# Patient Record
Sex: Female | Born: 1945 | Race: Black or African American | Hispanic: No | Smoking: Never smoker
Health system: Southern US, Community
[De-identification: ages and names within clinical notes are randomized; demographics above are authoritative.]

## PROBLEM LIST (undated history)

## (undated) DIAGNOSIS — M1990 Unspecified osteoarthritis, unspecified site: Secondary | ICD-10-CM

## (undated) DIAGNOSIS — I5189 Other ill-defined heart diseases: Secondary | ICD-10-CM

## (undated) DIAGNOSIS — K219 Gastro-esophageal reflux disease without esophagitis: Secondary | ICD-10-CM

## (undated) DIAGNOSIS — I10 Essential (primary) hypertension: Secondary | ICD-10-CM

## (undated) DIAGNOSIS — R42 Dizziness and giddiness: Secondary | ICD-10-CM

## (undated) DIAGNOSIS — T7840XA Allergy, unspecified, initial encounter: Secondary | ICD-10-CM

## (undated) DIAGNOSIS — Z9289 Personal history of other medical treatment: Secondary | ICD-10-CM

## (undated) DIAGNOSIS — H269 Unspecified cataract: Secondary | ICD-10-CM

## (undated) DIAGNOSIS — J189 Pneumonia, unspecified organism: Secondary | ICD-10-CM

## (undated) DIAGNOSIS — E785 Hyperlipidemia, unspecified: Secondary | ICD-10-CM

## (undated) DIAGNOSIS — K862 Cyst of pancreas: Secondary | ICD-10-CM

## (undated) HISTORY — DX: Dizziness and giddiness: R42

## (undated) HISTORY — PX: EYE SURGERY: SHX253

## (undated) HISTORY — DX: Allergy, unspecified, initial encounter: T78.40XA

## (undated) HISTORY — PX: ESOPHAGOGASTRODUODENOSCOPY: SHX1529

## (undated) HISTORY — PX: THYROIDECTOMY, PARTIAL: SHX18

## (undated) HISTORY — PX: COLONOSCOPY: SHX174

## (undated) HISTORY — PX: CERVICAL FUSION: SHX112

## (undated) HISTORY — PX: REPLACEMENT TOTAL KNEE BILATERAL: SUR1225

## (undated) HISTORY — PX: BACK SURGERY: SHX140

## (undated) HISTORY — PX: ABDOMINAL HYSTERECTOMY: SHX81

---

## 2002-11-02 ENCOUNTER — Encounter: Payer: Self-pay | Admitting: Internal Medicine

## 2002-11-02 ENCOUNTER — Encounter: Admission: RE | Admit: 2002-11-02 | Discharge: 2002-11-02 | Payer: Self-pay | Admitting: Internal Medicine

## 2002-11-09 ENCOUNTER — Encounter: Admission: RE | Admit: 2002-11-09 | Discharge: 2002-11-09 | Payer: Self-pay | Admitting: Internal Medicine

## 2002-11-09 ENCOUNTER — Encounter: Payer: Self-pay | Admitting: Internal Medicine

## 2003-01-24 ENCOUNTER — Encounter: Payer: Self-pay | Admitting: Orthopedic Surgery

## 2003-01-26 ENCOUNTER — Inpatient Hospital Stay (HOSPITAL_COMMUNITY): Admission: RE | Admit: 2003-01-26 | Discharge: 2003-01-30 | Payer: Self-pay | Admitting: Orthopedic Surgery

## 2003-08-11 ENCOUNTER — Encounter: Payer: Self-pay | Admitting: Gastroenterology

## 2003-08-18 ENCOUNTER — Encounter: Payer: Self-pay | Admitting: Gastroenterology

## 2003-08-18 ENCOUNTER — Encounter (INDEPENDENT_AMBULATORY_CARE_PROVIDER_SITE_OTHER): Payer: Self-pay | Admitting: *Deleted

## 2003-08-18 ENCOUNTER — Ambulatory Visit (HOSPITAL_COMMUNITY): Admission: RE | Admit: 2003-08-18 | Discharge: 2003-08-18 | Payer: Self-pay | Admitting: Gastroenterology

## 2003-10-07 ENCOUNTER — Encounter: Admission: RE | Admit: 2003-10-07 | Discharge: 2003-10-07 | Payer: Self-pay | Admitting: Internal Medicine

## 2003-10-07 ENCOUNTER — Encounter (INDEPENDENT_AMBULATORY_CARE_PROVIDER_SITE_OTHER): Payer: Self-pay | Admitting: *Deleted

## 2003-11-30 ENCOUNTER — Encounter: Payer: Self-pay | Admitting: Gastroenterology

## 2003-12-08 ENCOUNTER — Ambulatory Visit (HOSPITAL_COMMUNITY): Admission: RE | Admit: 2003-12-08 | Discharge: 2003-12-08 | Payer: Self-pay | Admitting: Gastroenterology

## 2003-12-08 ENCOUNTER — Encounter (INDEPENDENT_AMBULATORY_CARE_PROVIDER_SITE_OTHER): Payer: Self-pay | Admitting: *Deleted

## 2003-12-08 ENCOUNTER — Encounter: Payer: Self-pay | Admitting: Gastroenterology

## 2003-12-09 ENCOUNTER — Encounter: Admission: RE | Admit: 2003-12-09 | Discharge: 2003-12-09 | Payer: Self-pay | Admitting: Internal Medicine

## 2003-12-28 ENCOUNTER — Encounter: Payer: Self-pay | Admitting: Gastroenterology

## 2004-06-20 ENCOUNTER — Encounter: Payer: Self-pay | Admitting: Gastroenterology

## 2004-08-22 ENCOUNTER — Encounter (INDEPENDENT_AMBULATORY_CARE_PROVIDER_SITE_OTHER): Payer: Self-pay | Admitting: Cardiology

## 2004-08-22 ENCOUNTER — Ambulatory Visit (HOSPITAL_COMMUNITY): Admission: RE | Admit: 2004-08-22 | Discharge: 2004-08-22 | Payer: Self-pay | Admitting: Cardiology

## 2004-08-29 ENCOUNTER — Encounter: Admission: RE | Admit: 2004-08-29 | Discharge: 2004-08-29 | Payer: Self-pay | Admitting: Cardiology

## 2004-09-30 ENCOUNTER — Emergency Department (HOSPITAL_COMMUNITY): Admission: EM | Admit: 2004-09-30 | Discharge: 2004-09-30 | Payer: Self-pay | Admitting: Emergency Medicine

## 2004-10-05 ENCOUNTER — Inpatient Hospital Stay (HOSPITAL_COMMUNITY): Admission: AD | Admit: 2004-10-05 | Discharge: 2004-10-09 | Payer: Self-pay | Admitting: Specialist

## 2004-11-29 ENCOUNTER — Other Ambulatory Visit: Admission: RE | Admit: 2004-11-29 | Discharge: 2004-11-29 | Payer: Self-pay | Admitting: Obstetrics and Gynecology

## 2005-01-07 ENCOUNTER — Encounter (HOSPITAL_COMMUNITY): Admission: RE | Admit: 2005-01-07 | Discharge: 2005-01-24 | Payer: Self-pay | Admitting: Internal Medicine

## 2005-02-12 ENCOUNTER — Encounter: Admission: RE | Admit: 2005-02-12 | Discharge: 2005-02-12 | Payer: Self-pay | Admitting: Obstetrics and Gynecology

## 2005-02-27 ENCOUNTER — Encounter: Admission: RE | Admit: 2005-02-27 | Discharge: 2005-02-27 | Payer: Self-pay | Admitting: Specialist

## 2005-03-05 ENCOUNTER — Encounter (INDEPENDENT_AMBULATORY_CARE_PROVIDER_SITE_OTHER): Payer: Self-pay | Admitting: *Deleted

## 2005-03-06 ENCOUNTER — Inpatient Hospital Stay (HOSPITAL_COMMUNITY): Admission: RE | Admit: 2005-03-06 | Discharge: 2005-03-06 | Payer: Self-pay

## 2005-08-19 ENCOUNTER — Encounter (INDEPENDENT_AMBULATORY_CARE_PROVIDER_SITE_OTHER): Payer: Self-pay | Admitting: *Deleted

## 2005-08-19 ENCOUNTER — Encounter (HOSPITAL_COMMUNITY): Admission: RE | Admit: 2005-08-19 | Discharge: 2005-11-17 | Payer: Self-pay | Admitting: Gastroenterology

## 2005-08-19 ENCOUNTER — Encounter: Payer: Self-pay | Admitting: Gastroenterology

## 2005-08-22 ENCOUNTER — Encounter: Payer: Self-pay | Admitting: Gastroenterology

## 2005-08-22 ENCOUNTER — Encounter (INDEPENDENT_AMBULATORY_CARE_PROVIDER_SITE_OTHER): Payer: Self-pay | Admitting: *Deleted

## 2005-09-10 ENCOUNTER — Encounter: Admission: RE | Admit: 2005-09-10 | Discharge: 2005-09-10 | Payer: Self-pay | Admitting: Specialist

## 2005-09-12 ENCOUNTER — Encounter: Payer: Self-pay | Admitting: Gastroenterology

## 2005-10-14 ENCOUNTER — Ambulatory Visit: Payer: Self-pay | Admitting: Physical Medicine & Rehabilitation

## 2005-10-14 ENCOUNTER — Encounter
Admission: RE | Admit: 2005-10-14 | Discharge: 2006-01-12 | Payer: Self-pay | Admitting: Physical Medicine & Rehabilitation

## 2005-12-02 ENCOUNTER — Ambulatory Visit (HOSPITAL_BASED_OUTPATIENT_CLINIC_OR_DEPARTMENT_OTHER): Admission: RE | Admit: 2005-12-02 | Discharge: 2005-12-02 | Payer: Self-pay | Admitting: Urology

## 2006-01-22 ENCOUNTER — Encounter
Admission: RE | Admit: 2006-01-22 | Discharge: 2006-04-22 | Payer: Self-pay | Admitting: Physical Medicine & Rehabilitation

## 2006-01-22 ENCOUNTER — Ambulatory Visit: Payer: Self-pay | Admitting: Physical Medicine & Rehabilitation

## 2006-02-13 ENCOUNTER — Ambulatory Visit (HOSPITAL_COMMUNITY): Admission: RE | Admit: 2006-02-13 | Discharge: 2006-02-13 | Payer: Self-pay | Admitting: Orthopedic Surgery

## 2006-03-05 ENCOUNTER — Encounter: Admission: RE | Admit: 2006-03-05 | Discharge: 2006-03-05 | Payer: Self-pay | Admitting: Internal Medicine

## 2006-04-10 ENCOUNTER — Encounter: Admission: RE | Admit: 2006-04-10 | Discharge: 2006-04-10 | Payer: Self-pay | Admitting: Specialist

## 2006-06-24 ENCOUNTER — Inpatient Hospital Stay (HOSPITAL_COMMUNITY): Admission: RE | Admit: 2006-06-24 | Discharge: 2006-06-27 | Payer: Self-pay | Admitting: Specialist

## 2007-04-09 ENCOUNTER — Encounter: Admission: RE | Admit: 2007-04-09 | Discharge: 2007-04-09 | Payer: Self-pay | Admitting: Internal Medicine

## 2007-06-23 ENCOUNTER — Emergency Department (HOSPITAL_COMMUNITY): Admission: EM | Admit: 2007-06-23 | Discharge: 2007-06-23 | Payer: Self-pay | Admitting: Emergency Medicine

## 2007-12-08 ENCOUNTER — Encounter: Admission: RE | Admit: 2007-12-08 | Discharge: 2007-12-08 | Payer: Self-pay | Admitting: Specialist

## 2008-04-01 ENCOUNTER — Inpatient Hospital Stay (HOSPITAL_COMMUNITY): Admission: RE | Admit: 2008-04-01 | Discharge: 2008-04-05 | Payer: Self-pay | Admitting: Specialist

## 2008-05-12 ENCOUNTER — Encounter: Admission: RE | Admit: 2008-05-12 | Discharge: 2008-05-12 | Payer: Self-pay | Admitting: Internal Medicine

## 2008-09-29 ENCOUNTER — Encounter: Payer: Self-pay | Admitting: Gastroenterology

## 2008-10-05 ENCOUNTER — Encounter: Payer: Self-pay | Admitting: Gastroenterology

## 2009-03-31 DIAGNOSIS — N952 Postmenopausal atrophic vaginitis: Secondary | ICD-10-CM

## 2009-05-15 ENCOUNTER — Encounter: Admission: RE | Admit: 2009-05-15 | Discharge: 2009-05-15 | Payer: Self-pay | Admitting: Family Medicine

## 2009-06-13 ENCOUNTER — Emergency Department (HOSPITAL_COMMUNITY): Admission: EM | Admit: 2009-06-13 | Discharge: 2009-06-13 | Payer: Self-pay | Admitting: Emergency Medicine

## 2009-06-17 ENCOUNTER — Emergency Department (HOSPITAL_COMMUNITY): Admission: EM | Admit: 2009-06-17 | Discharge: 2009-06-17 | Payer: Self-pay | Admitting: Emergency Medicine

## 2010-04-02 ENCOUNTER — Encounter: Payer: Self-pay | Admitting: Gastroenterology

## 2010-04-05 ENCOUNTER — Encounter: Payer: Self-pay | Admitting: Gastroenterology

## 2010-04-12 ENCOUNTER — Encounter (INDEPENDENT_AMBULATORY_CARE_PROVIDER_SITE_OTHER): Payer: Self-pay | Admitting: *Deleted

## 2010-04-16 DIAGNOSIS — K219 Gastro-esophageal reflux disease without esophagitis: Secondary | ICD-10-CM | POA: Insufficient documentation

## 2010-04-16 DIAGNOSIS — M1990 Unspecified osteoarthritis, unspecified site: Secondary | ICD-10-CM | POA: Insufficient documentation

## 2010-04-16 DIAGNOSIS — K449 Diaphragmatic hernia without obstruction or gangrene: Secondary | ICD-10-CM | POA: Insufficient documentation

## 2010-04-16 DIAGNOSIS — R1310 Dysphagia, unspecified: Secondary | ICD-10-CM | POA: Insufficient documentation

## 2010-04-16 DIAGNOSIS — I10 Essential (primary) hypertension: Secondary | ICD-10-CM | POA: Insufficient documentation

## 2010-04-16 DIAGNOSIS — E785 Hyperlipidemia, unspecified: Secondary | ICD-10-CM | POA: Insufficient documentation

## 2010-04-17 ENCOUNTER — Ambulatory Visit: Payer: Self-pay | Admitting: Gastroenterology

## 2010-05-16 ENCOUNTER — Ambulatory Visit
Admission: RE | Admit: 2010-05-16 | Discharge: 2010-05-16 | Payer: Self-pay | Source: Home / Self Care | Attending: Gastroenterology | Admitting: Gastroenterology

## 2010-05-16 ENCOUNTER — Other Ambulatory Visit: Payer: Self-pay | Admitting: Gastroenterology

## 2010-05-17 LAB — HELICOBACTER PYLORI SCREEN-BIOPSY: UREASE: NEGATIVE

## 2010-05-18 ENCOUNTER — Encounter: Payer: Self-pay | Admitting: Gastroenterology

## 2010-05-20 ENCOUNTER — Encounter: Payer: Self-pay | Admitting: Specialist

## 2010-05-26 ENCOUNTER — Emergency Department (HOSPITAL_COMMUNITY)
Admission: EM | Admit: 2010-05-26 | Discharge: 2010-05-26 | Payer: Self-pay | Source: Home / Self Care | Admitting: Emergency Medicine

## 2010-05-31 NOTE — Procedures (Signed)
Summary: Colonoscopy  Patient: [name] Note: All result statuses are Final unless otherwise noted.  Tests: (1) Colonoscopy (COL)   COL Colonoscopy           DONE     [facility]     [address]     [place], [place]  [address]           COLONOSCOPY PROCEDURE REPORT           PATIENT:  Denise, Forbes  MR#:  [id number]     BIRTHDATE:  12-Apr-1946, 64 yrs. old  GENDER:  female     ENDOSCOPIST:  Vania Rea. Jarold Motto, MD, [facility]     REF. BY:  Elizabeth Palau, F.N.P.-B.C.     PROCEDURE DATE:  05/16/2010     PROCEDURE:  Higher-risk screening colonoscopy G0105           ASA CLASS:  Class II     INDICATIONS:  history of colon cancer     MEDICATIONS:   Fentanyl 100 mcg IV LOW PAIN THRESHOLD.WILL NEED     PROPOFOL SEDATION IN FUTURE., Versed 7 mg IV           DESCRIPTION OF PROCEDURE:   After the risks benefits and     alternatives of the procedure were thoroughly explained, informed     consent was obtained.  Digital rectal exam was performed and     revealed no abnormalities.   The LB 180AL [id number] endoscope was     introduced through the anus and advanced to the cecum, which was     identified by both the appendix and ileocecal valve, without     limitations.  The quality of the prep was excellent, using     MoviPrep.  The instrument was then slowly withdrawn as the colon     was fully examined.     <<PROCEDUREIMAGES>>           FINDINGS:  No polyps or cancers were seen.  This was otherwise a     normal examination of the colon.   Retroflexed views in the rectum     revealed no abnormalities.    The scope was then withdrawn from     the patient and the procedure completed.           COMPLICATIONS:  None     ENDOSCOPIC IMPRESSION:     1) No polyps or cancers     2) Otherwise normal examination     RECOMMENDATIONS:     1) high fiber diet     2) metamucil or benefiber     3) Continue current colorectal screening recommendations for     "routine risk" patients with a  repeat colonoscopy in 10 years.     REPEAT EXAM:  No           ______________________________     Vania Rea. Jarold Motto, MD, [facility]           CC:  Elizabeth Palau, F.N.P.-B.C.           n.     eSIGNED:   Vania Rea. Patterson at 05/16/2010 03:41 PM           Delma Officer, [phone]  Note: An exclamation mark (!) indicates a result that was not dispersed into the flowsheet. Document Creation Date: 05/16/2010 3:42 PM _______________________________________________________________________  (1) Order result status: Final Collection or observation date-time: 05/16/2010 15:36 Requested date-time:  Receipt date-time:  Reported date-time:  Referring Physician:  Ordering Physician: Sheryn Bison [phone]) Specimen Source:  Source: Launa Grill Order Number: [id number] Lab site:   Appended Document: Colonoscopy    Clinical Lists Changes  Observations: Added new observation of COLONNXTDUE: 04/2020 (05/16/2010 16:38)

## 2010-05-31 NOTE — Procedures (Addendum)
Summary: Upper Endoscopy  Patient: [name] Note: All result statuses are Final unless otherwise noted.  Tests: (1) Upper Endoscopy (EGD)   EGD Upper Endoscopy       DONE     [facility]     [address]     [place], [place]  [address]           ENDOSCOPY PROCEDURE REPORT           PATIENT:  [name], [name]  MR#:  [id number]     BIRTHDATE:  [birth year], 64 yrs. old  GENDER:  female           ENDOSCOPIST:  Vania Rea. Jarold Motto, MD, [facility]     Referred by:  Elizabeth Palau, F.N.P.-B.C.     Maryelizabeth Rowan, M.D.           PROCEDURE DATE:  05/16/2010     PROCEDURE:  EGD with biopsy, [address], Maloney Dilation of Esophagus     ASA CLASS:  Class II     INDICATIONS:  dysphagia           MEDICATIONS:   There was residual sedation effect present from     prior procedure., Versed 1 mg IV     TOPICAL ANESTHETIC:           DESCRIPTION OF PROCEDURE:   After the risks benefits and     alternatives of the procedure were thoroughly explained, informed     consent was obtained.  The LB GIF-H180 [id number] endoscope was     introduced through the mouth and advanced to the second portion of     the duodenum, limited by retching and gagging.   The instrument     was slowly withdrawn as the mucosa was fully examined.     <<PROCEDUREIMAGES>>           A Schatzki's ring was found in the distal jejunum. DILATED #37F     MALONEY DILATOR.NO HEME OR PAIN. 3 CM HH AND FREE REFLUX NOTED.     Otherwise the examination was normal.  Otherwise normal esophagus.     Otherwise normal duodenum.  Otherwise normal stomach. CLO BX. DONE.     Retroflexed views revealed a hiatal hernia.    The scope was then     withdrawn from the patient and the procedure completed.           COMPLICATIONS:  None           ENDOSCOPIC IMPRESSION:     1) Schatzki's ring in the distal jejunum     2) Otherwise normal examination     3) Otherwise normal esophagus     4) Otherwise normal duodenum     5) Otherwise normal  stomach     6) A hiatal hernia     1.CHRONIC GERD     2.SCHATZSKI"S RING DILATED.     3.R/O H.PYLORI     RECOMMENDATIONS:     1) Rx CLO if positive     2) continue PPI     3) continue current medications           REPEAT EXAM:  No           ______________________________     Vania Rea. Jarold Motto, MD, [facility]           CC:  Elizabeth Palau, F.N.P.-B.C.     Maryelizabeth Rowan, M.D.           n.     eSIGNED:  Vania Rea. Patterson at 05/16/2010 03:55 PM           Delma Officer, [phone]  Note: An exclamation mark (!) indicates a result that was not dispersed into the flowsheet. Document Creation Date: 05/16/2010 3:55 PM _______________________________________________________________________  (1) Order result status: Final Collection or observation date-time: 05/16/2010 15:48 Requested date-time:  Receipt date-time:  Reported date-time:  Referring Physician:   Ordering Physician: Sheryn Bison [phone]) Specimen Source:  Source: Launa Grill Order Number: [id number] Lab site:

## 2010-05-31 NOTE — Procedures (Signed)
Summary: EDG/[facility]  EDG/[place]   Imported By: Sherian Rein 05/11/2010 10:40:24  _____________________________________________________________________  External Attachment:    Type:   Image     Comment:   External Document

## 2010-05-31 NOTE — Letter (Signed)
Summary: [facility]  [facility]   Imported By: Sherian Rein 05/11/2010 10:44:27  _____________________________________________________________________  External Attachment:    Type:   Image     Comment:   External Document

## 2010-05-31 NOTE — Miscellaneous (Signed)
Summary: Orders Update  Clinical Lists Changes  Orders: Added new Test order of TLB-H Pylori Screen Gastric Biopsy (83013-CLOTEST) - Signed 

## 2010-05-31 NOTE — Letter (Signed)
Summary: [facility]  [facility]   Imported By: Sherian Rein 05/11/2010 10:50:03  _____________________________________________________________________  External Attachment:    Type:   Image     Comment:   External Document

## 2010-05-31 NOTE — Letter (Signed)
Summary: No Show/[facility]  No Show/[facility]   Imported By: Sherian Rein 05/11/2010 10:36:02  _____________________________________________________________________  External Attachment:    Type:   Image     Comment:   External Document

## 2010-05-31 NOTE — Procedures (Signed)
Summary: EGD (Dr Evette Cristal)   EGD  Procedure date:  08/18/2003  Findings:      Location: [facility]   NAME:  [name], [name]                        ACCOUNT NO.:  [id number]   MEDICAL RECORD NO.:  [id number]                   PATIENT TYPE:  AMB   LOCATION:  ENDO                                 FACILITY:  [facility]   PHYSICIAN:  Graylin Shiver, M.D.                DATE OF BIRTH:  Dec 16, 1945   DATE OF PROCEDURE:  08/18/2003  DATE OF DISCHARGE:                                 OPERATIVE REPORT   PROCEDURE:  Esophagogastroduodenoscopy with biopsy for CLOtest.   INDICATIONS FOR PROCEDURE:  Chronic heartburn.   CONSENT:  Informed consent was obtained after explanation of the risks of  bleeding, infection, and perforation.   PREMEDICATION:  Fentanyl 75 mcg IV, Versed 7.5 mg IV.   PROCEDURE IN DETAIL:  With the patient in the left lateral decubitus  position, the Olympus gastroscope was inserted into the oropharynx and  passed into the esophagus.  It was advanced down the esophagus, into the  stomach, and into the duodenum.  The second portion and bulb of the duodenum  were normal.  The stomach showed a mild erythema compatible with gastritis.  A biopsy for CLOtest was obtained.  There was a hiatal hernia.  No lesions  were seen in the fundus or cardia on retroflexion.  The esophagus revealed  the esophagogastric junction to be at 35 cm.  The esophageal mucosa looked  normal.  She tolerated the procedure well without complications.   IMPRESSION:  1. Gastritis, CLOtest pending.  2. Hiatal hernia.   PLAN:  I suspect that her heartburn is secondary to gastroesophageal reflux.  I would recommend that she remain on a proton pump inhibitor for control of  her heartburn.                                               Graylin Shiver, M.D.    SFG/MEDQ  D:  08/18/2003  T:  08/18/2003  Job:  [id number]   cc:   Fleet Contras, M.D.  [address]  [place]  [place] [address]  Fax:  [phone]

## 2010-05-31 NOTE — Letter (Signed)
Summary: [facility]  [facility]   Imported By: Sherian Rein 05/11/2010 10:47:44  _____________________________________________________________________  External Attachment:    Type:   Image     Comment:   External Document

## 2010-05-31 NOTE — Letter (Signed)
Summary: Patient [facility] Biopsy Results  [facility]  [address] [place], [place] [address]   Phone: [phone]  Fax: [phone]        May 18, 2010 MRN: [id number]    [facility] [address] [place], [place]  [address]    Dear [name],  I am pleased to inform you that the biopsies taken during your recent endoscopic examination did not show any evidence of cancer upon pathologic examination.  Additional information/recommendations:  __No further action is needed at this time.  Please follow-up with      your primary care physician for your other healthcare needs.  __ Please call [phone] to schedule a return visit to review      your condition.  _xx_ Continue with the treatment plan as outlined on the day of your      exam.  __ You should have a repeat endoscopic examination for this problem              in _ months/years.   Please call us if you are having persistent problems or have questions about your condition that have not been fully answered at this time.  Sincerely,  Mardella Layman MD [facility]  This letter has been electronically signed by your physician.  Appended Document: Patient Notice-Endo Biopsy Results Letter mailed

## 2010-05-31 NOTE — Letter (Signed)
Summary: [facility]  [facility]   Imported By: Sherian Rein 05/11/2010 10:48:32  _____________________________________________________________________  External Attachment:    Type:   Image     Comment:   External Document

## 2010-05-31 NOTE — Procedures (Signed)
Summary: COLON (Dr Evette Cristal)   Colonoscopy  Procedure date:  08/18/2003  Findings:      Location:  [facility].    Procedures Next Due Date:    Colonoscopy: 08/2008 NAME:  [name], [name]                        ACCOUNT NO.:  [id number]   MEDICAL RECORD NO.:  [id number]                   PATIENT TYPE:  AMB   LOCATION:  ENDO                                 FACILITY:  [facility]   PHYSICIAN:  Graylin Shiver, M.D.                DATE OF BIRTH:  [birth year]   DATE OF PROCEDURE:  08/18/2003  DATE OF DISCHARGE:                                 OPERATIVE REPORT   PROCEDURE:  Colonoscopy.   INDICATIONS:  Family history of colon polyps.   Informed consent was obtained after explanation of risks such as bleeding,  infection and perforation.   PREMEDICATION:  The procedure was done immediately after an EGD, with an  additional 50 mcg of Fentanyl and Versed 2.5 mg given.   DESCRIPTION OF PROCEDURE:  With the patient the left lateral decubitus  position, rectal exam was performed, no masses were felt. The Olympus  colonoscope was inserted into the rectum, advanced around a tortuous colon  to the cecum.  Cecal landmarks were identified. The cecum and ascending  colon were normal. The transverse colon was normal. The descending colon,  sigmoid and rectum were normal.  She tolerated the procedure well without  complications.   IMPRESSION:  Normal colonoscopy to the cecum.   I would recommend a follow up colonoscopy again in 5 years in view of the  family history.                                               Graylin Shiver, M.D.    SFG/MEDQ  D:  08/18/2003  T:  08/18/2003  Job:  [id number]   cc:   Fleet Contras, M.D.  [address]  [place]  [place] [address]  Fax: [phone]

## 2010-05-31 NOTE — Letter (Signed)
Summary: Date Range: 12-15-03 to 12-28-03/[facility]  Date Range: 12-15-03 to 12-28-03/[facility]   Imported By: Sherian Rein 05/11/2010 10:46:40  _____________________________________________________________________  External Attachment:    Type:   Image     Comment:   External Document

## 2010-05-31 NOTE — Letter (Signed)
Summary: [facility] Instructions  [facility]  [address] [place], [place] [address]   Phone: [phone]  Fax: [phone]       [name]    11/16/1945    MRN: [id number]        Procedure Day Dorna Bloom: Wednesday 05/16/10     Arrival Time: 2:00 pm     Procedure Time: 3:00pm     Location of Procedure:                    _ x_  [facility] (4th Floor)  PREPARATION FOR COLONOSCOPY WITH MOVIPREP   Starting 5 days prior to your procedure 05/11/10 do not eat nuts, seeds, popcorn, corn, beans, peas,  salads, or any raw vegetables.  Do not take any fiber supplements (e.g. Metamucil, Citrucel, and Benefiber).  THE DAY BEFORE YOUR PROCEDURE         DATE: 05/15/10  DAY: Tuesday  1.  Drink clear liquids the entire day-NO SOLID FOOD  2.  Do not drink anything colored red or purple.  Avoid juices with pulp.  No orange juice.  3.  Drink at least 64 oz. (8 glasses) of fluid/clear liquids during the day to prevent dehydration and help the prep work efficiently.  CLEAR LIQUIDS INCLUDE: Water Jello Ice Popsicles Tea (sugar ok, no milk/cream) Powdered fruit flavored drinks Coffee (sugar ok, no milk/cream) Gatorade Juice: apple, white grape, white cranberry  Lemonade Clear bullion, consomm, broth Carbonated beverages (any kind) Strained chicken noodle soup Hard Candy                             4.  In the morning, mix first dose of MoviPrep solution:    Empty 1 Pouch A and 1 Pouch B into the disposable container    Add lukewarm drinking water to the top line of the container. Mix to dissolve    Refrigerate (mixed solution should be used within 24 hrs)  5.  Begin drinking the prep at 5:00 p.m. The MoviPrep container is divided by 4 marks.   Every 15 minutes drink the solution down to the next mark (approximately 8 oz) until the full liter is complete.   6.  Follow completed prep with 16 oz of clear liquid of your choice (Nothing red or purple).   Continue to drink clear liquids until bedtime.  7.  Before going to bed, mix second dose of MoviPrep solution:    Empty 1 Pouch A and 1 Pouch B into the disposable container    Add lukewarm drinking water to the top line of the container. Mix to dissolve    Refrigerate  THE DAY OF YOUR PROCEDURE      DATE: 05/16/10 DAY: Wednesday  Beginning at 10:00 a.m. (5 hours before procedure):         1. Every 15 minutes, drink the solution down to the next mark (approx 8 oz) until the full liter is complete.  2. Follow completed prep with 16 oz. of clear liquid of your choice.    3. You may drink clear liquids until 1:00 pm (2 HOURS BEFORE PROCEDURE).   MEDICATION INSTRUCTIONS  Unless otherwise instructed, you should take regular prescription medications with a small sip of water   as early as possible the morning of your procedure.        OTHER INSTRUCTIONS  You will need a responsible adult at least 64 years of  age to accompany you and drive you home.   This person must remain in the waiting room during your procedure.  Wear loose fitting clothing that is easily removed.  Leave jewelry and other valuables at home.  However, you may wish to bring a book to read or  an iPod/MP3 player to listen to music as you wait for your procedure to start.  Remove all body piercing jewelry and leave at home.  Total time from sign-in until discharge is approximately 2-3 hours.  You should go home directly after your procedure and rest.  You can resume normal activities the  day after your procedure.  The day of your procedure you should not:   Drive   Make legal decisions   Operate machinery   Drink alcohol   Return to work  You will receive specific instructions about eating, activities and medications before you leave.   MRN: [id number]  The above instructions have been reviewed and explained to me by  Lamona Curl CMA Duncan Dull)  April 17, 2010 2:12 PM    I fully  understand and can verbalize these instructions _____________________________ Date _________

## 2010-05-31 NOTE — Letter (Signed)
Summary: New Patient letter  [facility]  [address] [place], [place] [address]   Phone: [phone]  Fax: [phone]       04/12/2010 MRN: [id number]    [name] [address] [place], [place]  [address]  Dear Ms. Lamprecht,  Welcome to the Gastroenterology Division at [facility].    You are scheduled to see Dr.  Jarold Motto on 04/17/10 at 1:30 P.M.  on the 3rd floor at [facility], [address]  We ask that you try to arrive at our office 15 minutes prior to your appointment time to allow for check-in.  We would like you to complete the enclosed self-administered evaluation form prior to your visit and bring it with you on the day of your appointment.  We will review it with you.  Also, please bring a complete list of all your medications or, if you prefer, bring the medication bottles and we will list them.  Please bring your insurance card so that we may make a copy of it.  If your insurance requires a referral to see a specialist, please bring your referral form from your primary care physician.  Co-payments are due at the time of your visit and may be paid by cash, check or credit card.     Your office visit will consist of a consult with your physician (includes a physical exam), any laboratory testing he/she may order, scheduling of any necessary diagnostic testing (e.g. x-ray, ultrasound, CT-scan), and scheduling of a procedure (e.g. Endoscopy, Colonoscopy) if required.  Please allow enough time on your schedule to allow for any/all of these possibilities.    If you cannot keep your appointment, please call [phone] to cancel or reschedule prior to your appointment date.  This allows [place] the opportunity to schedule an appointment for another patient in need of care.  If you do not cancel or reschedule by 5 p.m. the business day prior to your appointment date, you will be charged a $50.00 late cancellation/no-show fee.    Thank you for  choosing [facility] for your medical needs.  We appreciate the opportunity to care for you.  Please visit [place] at our website  to learn more about our practice.                     Sincerely,                                                             The Gastroenterology Division

## 2010-05-31 NOTE — Assessment & Plan Note (Signed)
Summary: DYSPHAGIA & GERD...LSW.   History of Present Illness Visit Type: Initial Consult Primary GI MD: Sheryn Bison MD FACP FAGA Primary Provider: Maryelizabeth Rowan, MD Requesting Provider: Elizabeth Palau, NP Chief Complaint: dysphagia, x 5 months with nausea, vomiting, food sticking in throat History of Present Illness:   Somewhat Complicated 65 year old African American female chronic patient of Dr. Karma Greaser, now requests a referral to [facility] gastroneurology. She has extensive records and labs which were reviewed today.  This patient has chronic acid reflux and has been on some type of PPI medication for many years. Previous barium studies and endoscopies have confirmed acid reflux with a probable peptic stricture versus office. Last endoscopy was several years ago. She now describes progressive solid food dysphagia and a retropharyngeal air with occasional dysphagia for liquids.Her appetite is good and her weight is stable.She denies any specific food intolerances. She also complains of painful swallowing, but denies any hepatobiliary complaints.  Patient had colonoscopy 6 years ago which was unremarkable but does have a family history of multiple family members with colonic polyposis. She has an extensive and involved family history which was reviewed. Recent labs did show normal CBC and metabolic profile. Surgeries have included multiple spinal surgeries also plate implant patient in her cervical spine. She is status post hysterectomy.   GI Review of Systems    Reports abdominal pain, bloating, dysphagia with liquids, dysphagia with solids, loss of appetite, nausea, and  vomiting.     Location of  Abdominal pain: epigastric area.    Denies acid reflux, belching, chest pain, heartburn, vomiting blood, weight loss, and  weight gain.        Denies anal fissure, black tarry stools, change in bowel habit, constipation, diarrhea, diverticulosis, fecal incontinence, heme positive stool,  hemorrhoids, irritable bowel syndrome, jaundice, light color stool, liver problems, rectal bleeding, and  rectal pain. Preventive Screening-Counseling & Management      Drug Use:  no.      Current Medications (verified): 1)  Lisinopril-Hydrochlorothiazide 20-12.5 Mg Tabs (Lisinopril-Hydrochlorothiazide) .... Take 2 Tablets By Mouth Once Daily 2)  Simvastatin 40 Mg Tabs (Simvastatin) .... Take 1 Tablet By Mouth Once A Day 3)  Pantoprazole Sodium 40 Mg Tbec (Pantoprazole Sodium) .... Take 1 Tablet By Mouth Once Daily  Allergies (verified): 1)  ! Penicillin 2)  ! Ultram  Past History:  Past medical, surgical, family and social histories (including risk factors) reviewed for relevance to current acute and chronic problems.  Past Medical History: Current Problems:  HIATAL HERNIA (ICD-553.3) DYSPHAGIA UNSPECIFIED (ICD-787.20) HYPERLIPIDEMIA (ICD-272.4) GERD (ICD-530.81) HYPERTENSION (ICD-401.9) OSTEOARTHRITIS (ICD-715.90)  Past Surgical History: Back Surgery Hysterectomy Thyroidectomy Knee Replacement  Family History: Reviewed history from 04/16/2010 and no changes required. Family History of Heart Disease: Father, PUncle Family History of Breast Cancer: Daughter Family History of Colon Cancer:Paternal Uncle Family History of Uterine Cancer: Paternal Grandmother  Social History: Reviewed history from 04/16/2010 and no changes required. Patient has never smoked.  Alcohol Use - no Illicit Drug Use - no Drug Use:  no  Review of Systems       The patient complains of arthritis/joint pain, cough, sore throat, and voice change.  The patient denies allergy/sinus, anemia, anxiety-new, back pain, blood in urine, breast changes/lumps, change in vision, confusion, coughing up blood, depression-new, fainting, fatigue, fever, headaches-new, hearing problems, heart murmur, heart rhythm changes, itching, menstrual pain, muscle pains/cramps, night sweats, nosebleeds, pregnancy symptoms,  shortness of breath, skin rash, sleeping problems, swelling of feet/legs, swollen lymph glands, thirst -  excessive , urination - excessive , urination changes/pain, urine leakage, and vision changes.    Vital Signs:  Patient profile:   65 year old female Height:      65 inches Weight:      247.13 pounds BMI:     41.27 Pulse rate:   72 / minute Pulse rhythm:   regular BP sitting:   132 / 70  (left arm) Cuff size:   large  Vitals Entered By: June McMurray CMA Duncan Dull) (April 17, 2010 1:30 PM)  Physical Exam  General:  Well developed, well nourished, no acute distress.healthy appearing.   Head:  Normocephalic and atraumatic. Eyes:  PERRLA, no icterus. Mouth:  No deformity or lesions, dentition normal. Neck:  Supple; no masses or thyromegaly. Lungs:  Clear throughout to auscultation. Heart:  Regular rate and rhythm; no murmurs, rubs,  or bruits. Abdomen:  Soft, nontender and nondistended. No masses, hepatosplenomegaly or hernias noted. Normal bowel sounds. Extremities:  No clubbing, cyanosis, edema or deformities noted. Neurologic:  Alert and  oriented x4;  grossly normal neurologically. Cervical Nodes:  No significant cervical adenopathy. Psych:  Alert and cooperative. Normal mood and affect.   Impression & Recommendations:  Problem # 1:  FAMILY HISTORY COLONIC POLYPS (ICD-V18.51) Assessment Unchanged Colonoscopy scheduled at her convenience with standard balance electrolyte preparation. Orders: Endo Savary  Dil/Colon (Endo Sav Dil/Col)  Problem # 2:  DYSPHAGIA UNSPECIFIED (ICD-787.20) Assessment: Deteriorated Probable esophageal stricture related to chronic acid reflux--consider esophageal motility disorder such as achalasia. I have set her up for repeat endoscopy, dilatation, and she possibly will need esophageal manometry. She will continue her PPI and I have added Carafate suspension a.c. and q.h.s. as tolerated. Also at the time of endoscopy we will do biopsies to exclude  eosinophilic esophagitis although I think this is unlikely. Orders: Endo Savary  Dil/Colon (Endo Sav Dil/Col)  Problem # 3:  HYPERTENSION (ICD-401.9) Assessment: Improved Blood Pressure today 132/70, and she is to continue lisinopril-HCTZ daily and simvastatin for hyperlipidemia per Dr. Dareen Piano.  Patient Instructions: 1)  You have been scheduled for an endoscopy and colonoscopy. Please follow written instructions that were given to you at your office visit today.  2)  Please pick up your prescription for Moviprep n at the pharmacy. An electronic presription has already been sent.  3)  Please pick up your prescriptions at the pharmacy. Electronic prescription(s) has already been sent for Carafate. You should take 1 tablespoon after meals and at bedtime. 4)  Avoid foods high in acid content ( tomatoes, citrus juices, spicy foods) . Avoid eating within 3 to 4 hours of lying down or before exercising. Do not over eat; try smaller more frequent meals. Elevate head of bed four inches when sleeping. You have also been given a handout regarding this. 5)  Copy sent to : Maryelizabeth Rowan, MD and Elizabeth Palau, NP 6)  The medication list was reviewed and reconciled.  All changed / newly prescribed medications were explained.  A complete medication list was provided to the patient / caregiver. Prescriptions: MOVIPREP 100 GM  SOLR (PEG-KCL-NACL-NASULF-NA ASC-C) As per prep instructions.  #1 x 0   Entered by:   Lamona Curl CMA (AAMA)   Authorized by:   Mardella Layman MD [facility]   Signed by:   Lamona Curl CMA (AAMA) on 04/17/2010   Method used:   Electronically to        [facility] (retail)       [address]       [place], [place]  [id number]       Ph: [phone]       Fax: [phone]   RxID:   [id number] CARAFATE 1 GM/10ML SUSP (SUCRALFATE) Take 1 tablespoon after meals and at bedtime  #1 bottle x 1   Entered by:   Lamona Curl CMA (AAMA)    Authorized by:   Mardella Layman MD [facility]   Signed by:   Lamona Curl CMA (AAMA) on 04/17/2010   Method used:   Electronically to        [facility] (retail)       [address]       [place], [place]  [id number]       Ph: [phone]       Fax: [phone]   RxID:   [id number]

## 2010-05-31 NOTE — Procedures (Signed)
Summary: Colonoscopy/[facility]  Colonoscopy/[facility]   Imported By: Sherian Rein 05/11/2010 10:42:23  _____________________________________________________________________  External Attachment:    Type:   Image     Comment:   External Document

## 2010-05-31 NOTE — Procedures (Signed)
Summary: Colonoscopy/[place]  Colonoscopy/[place]   Imported By: Sherian Rein 05/11/2010 10:41:19  _____________________________________________________________________  External Attachment:    Type:   Image     Comment:   External Document

## 2010-05-31 NOTE — Letter (Signed)
Summary: [facility]  [facility]   Imported By: Lester [place] 04/24/2010 11:33:13  _____________________________________________________________________  External Attachment:    Type:   Image     Comment:   External Document

## 2010-05-31 NOTE — Letter (Signed)
Summary: [facility]  [facility]   Imported By: Sherian Rein 05/11/2010 10:43:37  _____________________________________________________________________  External Attachment:    Type:   Image     Comment:   External Document

## 2010-06-07 ENCOUNTER — Other Ambulatory Visit: Payer: Self-pay | Admitting: Family Medicine

## 2010-06-07 DIAGNOSIS — I8390 Asymptomatic varicose veins of unspecified lower extremity: Secondary | ICD-10-CM | POA: Insufficient documentation

## 2010-06-07 DIAGNOSIS — M7989 Other specified soft tissue disorders: Secondary | ICD-10-CM

## 2010-06-08 DIAGNOSIS — I872 Venous insufficiency (chronic) (peripheral): Secondary | ICD-10-CM | POA: Insufficient documentation

## 2010-07-14 ENCOUNTER — Inpatient Hospital Stay (INDEPENDENT_AMBULATORY_CARE_PROVIDER_SITE_OTHER)
Admission: RE | Admit: 2010-07-14 | Discharge: 2010-07-14 | Disposition: A | Discharge status: Home / Self Care | Payer: Medicare Other | Source: Ambulatory Visit | Attending: Emergency Medicine | Admitting: Emergency Medicine

## 2010-07-14 DIAGNOSIS — K219 Gastro-esophageal reflux disease without esophagitis: Secondary | ICD-10-CM

## 2010-07-16 ENCOUNTER — Other Ambulatory Visit (HOSPITAL_COMMUNITY): Payer: Self-pay | Admitting: Orthopedic Surgery

## 2010-07-16 ENCOUNTER — Other Ambulatory Visit: Payer: Self-pay | Admitting: Orthopedic Surgery

## 2010-07-16 ENCOUNTER — Ambulatory Visit (HOSPITAL_COMMUNITY)
Admission: RE | Admit: 2010-07-16 | Discharge: 2010-07-16 | Disposition: A | Discharge status: Home / Self Care | Payer: Medicare Other | Source: Ambulatory Visit | Attending: Orthopedic Surgery | Admitting: Orthopedic Surgery

## 2010-07-16 ENCOUNTER — Encounter (HOSPITAL_COMMUNITY): Payer: Medicare Other

## 2010-07-16 DIAGNOSIS — Z01818 Encounter for other preprocedural examination: Secondary | ICD-10-CM | POA: Insufficient documentation

## 2010-07-16 DIAGNOSIS — M1710 Unilateral primary osteoarthritis, unspecified knee: Secondary | ICD-10-CM | POA: Insufficient documentation

## 2010-07-16 DIAGNOSIS — M47814 Spondylosis without myelopathy or radiculopathy, thoracic region: Secondary | ICD-10-CM | POA: Insufficient documentation

## 2010-07-16 DIAGNOSIS — Z96659 Presence of unspecified artificial knee joint: Secondary | ICD-10-CM

## 2010-07-16 DIAGNOSIS — Z01812 Encounter for preprocedural laboratory examination: Secondary | ICD-10-CM | POA: Insufficient documentation

## 2010-07-16 LAB — BASIC METABOLIC PANEL
BUN: 12 mg/dL (ref 6–23)
CO2: 31 mEq/L (ref 19–32)
Glucose, Bld: 72 mg/dL (ref 70–99)
Potassium: 3.9 mEq/L (ref 3.5–5.1)
Sodium: 137 mEq/L (ref 135–145)

## 2010-07-16 LAB — URINALYSIS, ROUTINE W REFLEX MICROSCOPIC
Bilirubin Urine: NEGATIVE
Glucose, UA: NEGATIVE mg/dL
Ketones, ur: NEGATIVE mg/dL
Nitrite: NEGATIVE
Specific Gravity, Urine: 1.004 — ABNORMAL LOW (ref 1.005–1.030)
pH: 6.5 (ref 5.0–8.0)

## 2010-07-16 LAB — CBC
HCT: 40.4 % (ref 36.0–46.0)
Hemoglobin: 13 g/dL (ref 12.0–15.0)
MCHC: 32.2 g/dL (ref 30.0–36.0)
MCV: 80.2 fL (ref 78.0–100.0)
RDW: 15.1 % (ref 11.5–15.5)
WBC: 3.8 10*3/uL — ABNORMAL LOW (ref 4.0–10.5)

## 2010-07-16 LAB — APTT: aPTT: 37 seconds (ref 24–37)

## 2010-07-16 LAB — DIFFERENTIAL
Basophils Absolute: 0 10*3/uL (ref 0.0–0.1)
Eosinophils Relative: 4 % (ref 0–5)
Lymphocytes Relative: 50 % — ABNORMAL HIGH (ref 12–46)
Lymphs Abs: 1.9 10*3/uL (ref 0.7–4.0)
Monocytes Absolute: 0.4 10*3/uL (ref 0.1–1.0)
Neutro Abs: 1.3 10*3/uL — ABNORMAL LOW (ref 1.7–7.7)

## 2010-07-16 LAB — SURGICAL PCR SCREEN
MRSA, PCR: NEGATIVE
Staphylococcus aureus: NEGATIVE

## 2010-07-23 ENCOUNTER — Inpatient Hospital Stay (HOSPITAL_COMMUNITY)
Admission: RE | Admit: 2010-07-23 | Discharge: 2010-07-26 | DRG: 470 | Disposition: A | Discharge status: Skilled Nursing Facility | Payer: Medicare Other | Source: Ambulatory Visit | Attending: Orthopedic Surgery | Admitting: Orthopedic Surgery

## 2010-07-23 DIAGNOSIS — Z01812 Encounter for preprocedural laboratory examination: Secondary | ICD-10-CM

## 2010-07-23 DIAGNOSIS — E7800 Pure hypercholesterolemia, unspecified: Secondary | ICD-10-CM | POA: Diagnosis present

## 2010-07-23 DIAGNOSIS — I10 Essential (primary) hypertension: Secondary | ICD-10-CM | POA: Diagnosis present

## 2010-07-23 DIAGNOSIS — K219 Gastro-esophageal reflux disease without esophagitis: Secondary | ICD-10-CM | POA: Diagnosis present

## 2010-07-23 DIAGNOSIS — Z96659 Presence of unspecified artificial knee joint: Secondary | ICD-10-CM

## 2010-07-23 DIAGNOSIS — Z880 Allergy status to penicillin: Secondary | ICD-10-CM

## 2010-07-23 DIAGNOSIS — S51009A Unspecified open wound of unspecified elbow, initial encounter: Secondary | ICD-10-CM | POA: Diagnosis not present

## 2010-07-23 DIAGNOSIS — W010XXA Fall on same level from slipping, tripping and stumbling without subsequent striking against object, initial encounter: Secondary | ICD-10-CM | POA: Diagnosis not present

## 2010-07-23 DIAGNOSIS — M1710 Unilateral primary osteoarthritis, unspecified knee: Principal | ICD-10-CM | POA: Diagnosis present

## 2010-07-23 DIAGNOSIS — Y9210 Unspecified residential institution as the place of occurrence of the external cause: Secondary | ICD-10-CM | POA: Diagnosis not present

## 2010-07-23 LAB — TYPE AND SCREEN
ABO/RH(D): AB POS
Antibody Screen: NEGATIVE

## 2010-07-23 NOTE — Op Note (Signed)
[name], [name]              ACCOUNT NO.:  [id number]  MEDICAL RECORD NO.:  [id number]           PATIENT TYPE:  I  LOCATION:  0008                         FACILITY:  [facility]  PHYSICIAN:  Madlyn Frankel. Charlann Boxer, M.D.  DATE OF BIRTH:  [birth year]  DATE OF PROCEDURE: DATE OF DISCHARGE:                              OPERATIVE REPORT   PREOPERATIVE DIAGNOSIS:  Left knee osteoarthritis.  POSTOPERATIVE DIAGNOSIS:  Left knee osteoarthritis.  PROCEDURE:  Left total knee replacement.  COMPONENTS USED:  DePuy knee system, size 3 femur, 2.5 tibia, 10-mm insert to match 3 femur, 38 patellar button.  SURGEON:  Madlyn Frankel. Charlann Boxer, M.D.  ASSISTANT:  Jaquelyn Bitter. Chabon, P.A.  ANESTHESIA:  Preoperative regional femoral nerve block plus general endotracheal.  SPECIMENS:  None.  COMPLICATIONS:  None.  DRAINS:  One Hemovac.  TOURNIQUET TIME:  37 minutes at 250 mmHg.  INDICATIONS FOR PROCEDURE:  Ms. Deyo is a 65 year old patient now with previous right total knee replacement.  She had done well with it. Her left knee has progressed to the point of decreased quality life. She at this point was ready to proceed with knee arthroplasty.  Risks and benefits reviewed for the consent of pain relief after her previous experience of this.  PROCEDURE IN DETAIL:  The patient was brought to operative theater. Once adequate anesthesia, preoperative antibiotics, Ancef administered, the patient was positioned supine on the operative table.  Left thigh tourniquet was placed, left lower extremity was then prepped and draped in sterile fashion.  Left foot hooked to Mayo leg holder.  Time-out was performed, identifying the patient, planned procedure of extremity. Left lower extremity was then exsanguinated, tourniquet elevated to 250 mmHg.  Midline incision was made, followed by median arthrotomy. Following initial exposure and debridement, attention was first directed to the patella.  Precut measurement was  approximately 23 mm.  I resected down to 14 mm and used a 38 patellar button to restore height and get the best coverage of the cut surface.  Lug holes were placed and a trial positioned to protect the cut surface of the patella from retractors and saw blades.  At this point, femoral canal was opened, irrigated to try to prevent fat emboli.  Intramedullary rod was passed and at 3 degrees of valgus, I resected 10 mm of bone off the distal femur.  The tibia was then subluxated anteriorly and using a measured resection of the extramedullary guide, I resected 10 mm bone based off the proximal lateral tibia.  She then confirmed that the gap was going to be stable with a 10-mm block and that the cut was perpendicular in coronal plane. Once this was done, I sized the femur to be a size 3.  The rotation block was pinned into position with anterior reference but using the C clamp set rotation based off the proximal tibia cut.  The former cutting block was then pinned into position.  The anterior, posterior and chamfer cuts were then made without difficulty or notching.  Final box cut was made up the lateral aspect of distal femur.  At this point, tibia was subluxated again.  The cut surfacing to be best fit with size 2.5 tibial tray was pinned into position, drilled and keel punch.  Trial reduction was now carried out with 3 femur, 2.5 tibia and a 10-mm insert.  The patella tracked through the trochlea without application pressure.  The knee ligaments were stable from extension and flexion. Given these findings, the trial components were removed.  The synovial capsule junction of the knee was injected with 0.25% Marcaine with epinephrine.  The knee had been irrigated with normal saline solution and final components were opened and cement mixed.  Final components were cemented onto clean and dried cut surfaces of bone.  The knee was brought to extension with a 10-mm insert.  Extruded cement was  removed.  Once the cement had fully cured, an excess cement was removed throughout the knee, chose to 10 mm insert.  Final insert was then placed.  The tourniquet had been let down after 37 minutes without significant hemostasis required.  Medium Hemovac drain was placed deep.  The extensor mechanism was then reapproximated with the knee in flexion using #1 Vicryl.  The remainder of wound was closed with 2-0 Vicryl and running 4-0 Monocryl.  The knee was cleaned, dried and dressed sterilely with Dermabond and Aquacel dressing.  Drain site dressed separately. The knee was wrapped into an Ace wrap.  She was then extubated, brought to recovery room in stable condition, tolerating procedure well.     Madlyn Frankel Charlann Boxer, M.D.     MDO/MEDQ  D:  07/23/2010  T:  07/23/2010  Job:  [id number]  Electronically Signed by Durene Romans M.D. on 07/23/2010 04:23:47 PM

## 2010-07-24 LAB — BASIC METABOLIC PANEL
BUN: 14 mg/dL (ref 6–23)
Calcium: 8.6 mg/dL (ref 8.4–10.5)
Chloride: 103 mEq/L (ref 96–112)
Creatinine, Ser: 1.03 mg/dL (ref 0.4–1.2)
GFR calc Af Amer: >60 mL/min (ref >60)
GFR calc non Af Amer: 54 mL/min — ABNORMAL LOW (ref >60)

## 2010-07-24 LAB — CBC
MCH: 25.9 pg — ABNORMAL LOW (ref 26.0–34.0)
MCHC: 32 g/dL (ref 30.0–36.0)
MCV: 81 fL (ref 78.0–100.0)
Platelets: 197 10*3/uL (ref 150–400)
RDW: 14.9 % (ref 11.5–15.5)

## 2010-07-25 LAB — BASIC METABOLIC PANEL
BUN: 12 mg/dL (ref 6–23)
GFR calc Af Amer: >60 mL/min (ref >60)
GFR calc non Af Amer: 50 mL/min — ABNORMAL LOW (ref >60)
Potassium: 4.1 mEq/L (ref 3.5–5.1)

## 2010-07-25 LAB — CBC
MCV: 81.7 fL (ref 78.0–100.0)
Platelets: 167 10*3/uL (ref 150–400)
RDW: 15.1 % (ref 11.5–15.5)
WBC: 5.4 10*3/uL (ref 4.0–10.5)

## 2010-07-26 LAB — BASIC METABOLIC PANEL
Calcium: 8.3 mg/dL — ABNORMAL LOW (ref 8.4–10.5)
Chloride: 105 mEq/L (ref 96–112)
Creatinine, Ser: 1.64 mg/dL — ABNORMAL HIGH (ref 0.4–1.2)
GFR calc Af Amer: 38 mL/min — ABNORMAL LOW (ref >60)
Sodium: 140 mEq/L (ref 135–145)

## 2010-07-27 NOTE — Discharge Summary (Signed)
NAMECYNDAL, [name]              ACCOUNT NO.:  [id number]  MEDICAL RECORD NO.:  [id number]           PATIENT TYPE:  I  LOCATION:  1611                         FACILITY:  [facility]  PHYSICIAN:  Madlyn Frankel. Charlann Boxer, M.D.  DATE OF BIRTH:  Jan 14, 1946  DATE OF ADMISSION:  07/23/2010 DATE OF DISCHARGE:  07/26/2010                        DISCHARGE SUMMARY - REFERRING   ADMITTING DIAGNOSIS:  Left knee osteoarthritis.  DISCHARGE DIAGNOSES: 1. Left knee osteoarthritis, status post left total knee replacement     on July 23, 2010. 2. Hypertension. 3. Reflux disease. 4. Hypercholesterolemia.  HISTORY OF PRESENT ILLNESS:  [name] is a 65 year old female patient of mine with left knee advanced osteoarthritis.  She had a right total knee replacement that she done very well with and at this point is ready to proceed with her left knee.  Risks and benefits were reviewed as well as the hospital course.  Consent was obtained through office discussion admitting her.  HOSPITAL COURSE:  The patient was admitted for same-day surgery on July 23, 2010.  She underwent a left total knee replacement that was uncomplicated.  Please see dictated operative note for full details of the procedure.  Postoperatively, after a routine stay in the recovery room, she was transferred to the orthopedic ward where she remained throughout her hospital stay.  On postop day #1, she had her Foley removed and Hemovac removed.  She was seen and evaluated by Physical Therapy and began mobility.  Her initial preoperative plan was to get to a nursing facility short term due to her independent living.  By postoperative day #2, she was noted have stable labs with electrolytes remaining stable with hematocrit of 33.4.  She was placed on Xarelto for DVT prophylaxis in the hospital.  By postop day #3, she was ready for discharge to nursing facility with arrangements made.  Social work consulted to help arrange  this.  DISCHARGE CONDITION:  The patient is stable at the time of discharge.  DISCHARGE INSTRUCTIONS:  The patient will be discharged to nursing facility on July 26, 2010.  She will be seen and evaluated by Physical Therapy to work on mobility, strengthening, gait training and range of motion.  She currently has an Aquacel dressing that will remain in place until Monday or Tuesday, August 01, 2010, or August 02, 2010.  This is a waterproof dressing, that she may shower with.  After this, the dressing will be removed and dry gauze applied as needed.  She will be on a regular diet.  She will return to see Dr. Durene Romans at [facility] office, number [phone] in 2-week period of time.  DISCHARGE MEDICATIONS: 1. Colace 100 mg p.o. b.i.d. as needed for constipation while on pain     medicine. 2. MiraLax 17 g p.o. daily as needed for constipation while on pain     medicine. 3. Iron 325 mg 2-3 times a day as tolerated for 2 weeks. 4. Norco 7.5/325 one to two tablets every 4-6 hours as needed for     pain. 5. Robaxin 500 mg q.6 hours as needed for muscle spasm pain. 6. Aspirin 325 mg  p.o. b.i.d. for DVT prophylaxis. 7. Lisinopril/hydrochlorothiazide 20/12.5 one tablet q.a.m. 8. Propranolol 40 mg q.a.m. 9. Simvastatin 40 mg q.h.s. 10.Mobic as needed.  Questions were encouraged and answered at the time of discharge.     Madlyn Frankel Charlann Boxer, M.D.     MDO/MEDQ  D:  07/26/2010  T:  07/26/2010  Job:  [id number]  Electronically Signed by Durene Romans M.D. on 07/27/2010 08:35:45 PM

## 2010-08-20 NOTE — H&P (Signed)
  NAMEDANYELLE, BROOKOVER              ACCOUNT NO.:  [id number]  MEDICAL RECORD NO.:  [id number]          PATIENT TYPE:  LOCATION:                                 FACILITY:  PHYSICIAN:  Madlyn Frankel. Charlann Boxer, M.D.  DATE OF BIRTH:  Jan 14, 1946  DATE OF ADMISSION: DATE OF DISCHARGE:                             HISTORY & PHYSICAL   CHIEF COMPLAINT:  Left knee osteoarthritis.  HISTORY OF PRESENT ILLNESS:  This is a 65 year old lady with a history of total knee arthroplasty in the right knee with good result who has osteoarthritis of the left knee and has failed conservative treatment after discussion of treatment, benefits, risks and options.  The patient is now scheduled for total knee arthroplasty of the left knee.  She passes criteria for tranexamic acid, we will get that in the preop holding area.  PAST MEDICAL HISTORY:  Drug allergies to PENICILLIN with a rash and itching and swelling and Ultram with a rash.  CURRENT MEDICATIONS: 1. Lisinopril/hydrochlorothiazide 20/12.5 one daily. 2. Pantoprazole 40 mg daily. 3. Simvastatin 40 mg at bedtime. 4. Vicodin 5/500 one q.4-6 hours p.r.n. pain. 5. Meloxicam 50 mg 1 daily.  SERIOUS MEDICAL ILLNESSES:  Include hypertension, hyperlipidemia, and reflux.  PREVIOUS SURGERIES:  Total knee arthroplasty on the right knee.  FAMILY HISTORY:  Positive for cancer, heart attack, and seizures.  SOCIAL HISTORY:  The patient is divorced.  She does not smoke and does not drink.  REVIEW OF SYSTEMS:  CENTRAL NERVOUS SYSTEM:  Negative for headache, blurred vision, and dizziness.  PULMONARY:  No shortness of breath, PND, or orthopnea.  CARDIOVASCULAR:  No chest pain or palpitation.  GI: Negative for ulcers, hepatitis.  GU:  Negative for urinary tract difficulty.  MUSCULOSKELETAL:  Positive as in HPI.  PHYSICAL EXAMINATION:  VITAL SIGNS:  BP 140/92, respirations 18, pulse 74 and regular. GENERAL APPEARANCE:  She is a well-developed, well-nourished lady,  in no acute distress. HEENT:  Head is normocephalic.  Nose patent.  Nares patent.  Pupils equal, round, and reactive to light.  Throat without injection. NECK:  Supple without adenopathy.  Carotids are 2+ without bruit. CHEST:  Clear to auscultation.  No rales or rhonchi.  Respirations are 18. HEART:  Regular rate and rhythm at 74 beats per minute without murmur. ABDOMEN:  Soft with active bowel sounds.  No mass, organomegaly. NEUROLOGIC:  The patient is alert and oriented to time, place, and person.  Cranial nerves II-XII grossly intact. EXTREMITIES:  Right knee status post total knee arthroplasty with full extension, further flexion to 120, left knee with 3 degrees flexion contracture, further flexion to 95 degrees with pain.  Neurovascular status intact.  IMPRESSION:  Left knee osteoarthritis.  PLAN:  Left total knee arthroplasty.     Jaquelyn Bitter. Chabon, P.A.   ______________________________ Madlyn Frankel Charlann Boxer, M.D.    SJC/MEDQ  D:  07/10/2010  T:  07/11/2010  Job:  [id number]  Electronically Signed by Jodene Nam P.A. on 08/20/2010 11:39:39 AM Electronically Signed by Durene Romans M.D. on 08/20/2010 11:56:22 AM

## 2010-09-11 NOTE — Discharge Summary (Signed)
[name], [name]              ACCOUNT NO.:  [id number]   MEDICAL RECORD NO.:  [id number]          PATIENT TYPE:  INP   LOCATION:  5030                         FACILITY:  [facility]   PHYSICIAN:  Kerrin Champagne, M.D.   DATE OF BIRTH:  30-Apr-1945   DATE OF ADMISSION:  04/01/2008  DATE OF DISCHARGE:  04/05/2008                               DISCHARGE SUMMARY   ADMISSION DIAGNOSES:  1. 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  2. Hypertension.  3. Gastroesophageal reflux disease.  4. Hypothyroidism status post partial thyroidectomy.  5. Status post right total knee arthroplasty.  6. Previous posterior cervical foraminotomies at C5-6 and C6-7.   DISCHARGE DIAGNOSES:  1. Severe biforaminal stenosis, L3-4, affecting both the L3 and L4      nerve roots.  2. Bilateral foraminal stenosis at L4-5, right side greater than left,      status post fusion which was felt to be solid at L4-5 with      posterolateral fusion.  3. Hypertension.  4. Gastroesophageal reflux disease.  5. Hypothyroidism status post partial thyroidectomy.  6. Status post right total knee arthroplasty.  7. Previous posterior cervical foraminotomies at C5-6 and C6-7.  8. Acute blood loss anemia requiring blood transfusion.   PROCEDURE:  On April 01, 2008, the patient underwent removal of  pedicle screw and rods from the L4-5 level.  Redo L3-4 and L4-5 with  central laminectomy and bilateral foraminal decompression with complete  facetectomies at L3-4.  Instrumentation L3 through L5 with [employer]  pedicle screws and rods, local bone graft, and Vitoss.  Left L3  transpedicular bone marrow aspiration for Vitoss and posterolateral  fusion, L3-4.  This was performed by Dr. Otelia Sergeant, assisted by Maud Deed, PA-C under general anesthesia.   CONSULTATIONS:  None.   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  She is status post lumbar fusion at the L4-5 level in 2002 and has also  had decompression at the L3-4 level in 2006.  MRI scan has shown  biforaminal narrowing and lateral recess stenosis at L3-4 above her  fusion at L4-5.  She has failed conservative treatment including  epidural steroid injections and wishes to proceed with surgical  intervention.  She was admitted for the procedure as stated above.   BRIEF HOSPITAL COURSE:  Postoperatively, neurovascular motor function of  the lower extremities was noted to be intact.  She did develop elevated  fevers as high as 102 postoperatively.  Chest x-ray was obtained and did  not show pneumonias.  Urinalysis was obtained and eventually did show  positive for urinary tract infection which was treated with Cipro  orally.  The patient was started on physical therapy for ambulation and  gait training.  Initially, she was slow with ambulation but once her  pain was better controlled and her fever decreased, she was able to  ambulate independently utilizing a walker.  She did wear her brace full-  time with exception of sleeping.  Dressing changes were done daily, and  her wound was healing without drainage, erythema, or edema.  Drain was  removed on the first postoperative day.  Foley catheter was  discontinued, and she was able to void without difficulty.  Prior to  discharge, she was ambulating as much as 150 feet.  On April 05, 2008,  she was afebrile.  Vital signs were stable.  The patient had excellent  pain control with oral analgesics.  She was felt stable for discharge to  her home.  Arrangements were made for home health physical therapy and  durable medical equipments prior to discharge.   PERTINENT LABORATORY VALUES:  On admission, CBC with WBC 3.9, hemoglobin  13.9, and hematocrit 42.0.  Hemoglobin and hematocrit dropped to 9.5 and  28.0 postoperatively.  She  received 2 units of packed red blood cells.  Hemoglobin at discharge 10.7 and hematocrit 32.3.  Coagulation studies  on admission were normal.  Chemistry studies on admission within normal  limits.  The patient developed hypokalemia at 3.2 postoperatively.  Urinalysis as stated above.   PLAN:  The patient was discharged to her home.  Arrangements were made  for home health physical therapy and durable medical equipment.  She  will wear her brace at all times when out of bed.  She will be allowed  to shower and change her dressing on a daily basis.  Follow up with Dr.  Otelia Sergeant in 2 weeks.  She is encouraged to ambulate as tolerated utilizing  a walker and her brace.  No bending, lifting, or twisting.  No driving.   MEDICATIONS AT DISCHARGE:  1. Robaxin 500 mg 1 every 8 hours as needed for spasms.  2. Cipro 500 mg p.o. q.12 h for 5 days.  3. Percocet 5/325 one to two every 4-6 hours as needed for pain.  She will also resume home medications as taken prior to admission and  was given a medication reconciliation form with these instructions.  She  is encouraged to use over-the-counter stool softener on a daily basis.  The patient was advised to call the office should she have questions or  concerns prior to her return office visit.  All questions were  encouraged and answered.      Wende Neighbors, P.A.      Kerrin Champagne, M.D.  Electronically Signed    SMV/MEDQ  D:  05/05/2008  T:  05/06/2008  Job:  [id number]

## 2010-09-11 NOTE — Op Note (Signed)
[name], [name]              ACCOUNT NO.:  [id number]   MEDICAL RECORD NO.:  [id number]          PATIENT TYPE:  INP   LOCATION:  5030                         FACILITY:  [facility]   PHYSICIAN:  Kerrin Champagne, M.D.   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   POSTOPERATIVE DIAGNOSES:  Severe biforaminal stenosis L3-4 bilateral, L3  lateral recess stenosis affecting the L4 nerve root, and bilateral  foraminal stenosis L4-5, right side greater than left, status post  fusion, solid L4-5 posterior lateral fusion.   PROCEDURE:  Removal of Infuse cross pedicle screws and rods at the L4-5  level, redo L3-4 and L4-5, and central laminectomy and a bilateral  foraminal decompression with complete facetectomies L3-4.  Instrumentation L3-5 with Monarch pedicle screws and rods, local bone  graft and Vitoss.  Left L3 transpedicular bone marrow aspiration to  charge the Vitoss.  Posterolateral fusion L3-4.   SURGEON:  Kerrin Champagne, MD.   ASSISTANT:  Wende Neighbors, PA-C.   ANESTHESIA:  General via orotracheal intubation by Dr. Jacklynn Bue  supplemented with local infiltration with Marcaine 0.5% with 1:200,000  epinephrine, 20 mL.   SPECIMENS:  None.   FINDINGS:  As above.   ESTIMATED BLOOD LOSS:  500 mL, Cell Saver returned 0.   COMPLICATIONS:  None.   DRAINS:  Foley to straight drain, Hemovac left lower lumbar spine.   The patient returned to the PACU in satisfactory condition.  Intraoperative neuro monitoring was used as well as Cell Saver.  Standard preoperative protocol markings of the surgical site for  expected TLIF on the right side as well as intraoperative timeout to  identify the patient's procedure to be performed.   DESCRIPTION OF PROCEDURE:  After adequate general anesthesia, this  patient was placed in a prone position, the [place] spine frame was  used.  She had standard preoperative antibiotics with vancomycin for  penicillin allergy, she had marking of the lower lumbar spine for  surgical procedure and nerve monitoring electrodes had been placed.  Foley catheter was placed prior to turning to the prone position.  All  pressure points were well padded.  The stockings and TED hose used to  prevent DVT.  The patient has a definite crease at the lumbar level and  Hypafix tape was placed across the lower sacral area, then placed on  retraction in order to remove the soft tissue crease present.  Standard  prep with DuraPrep solution was admitted to the dorsal spine to the mid  sacral segment, draped in the usual manner following prep with iodine Vi-  drape use.  The incision ellipsing the old incision scar extending from  approximately L1 to L5 and S1.  In the midline, the old incisional scar  was ellipsed down to the lumbodorsal fascia later.  The incision then at  the midline in the region of previous retained  spinous processes of the  L2 and L1superiorly, and inferiorly at the L5 and S1 nerve root. Cobb  was used to elevate paralumbar muscle off the posterior aspect of the  elements of residual portion of the L2 and L1 lamina both sides as well  as over the lateral aspects of the L5 lamina of both sides.  Dissection  was carried down to this level of the lamina in the midline and then  carried laterally to the areas of fusion mass in the pedicle screw and  rod construct both sides.  Careful dissection over the lateral aspects  of the facet at the L3-4 level was carried out and preserving the L2-3  facets and resecting the facet of L3-4 bilaterally.  Noted that abundant  amount of scar tissue present in the midline.  Careful dissection was  carried out, with first a Penfield 4 was placed into the  facet on the  left side noted to identify the level being surgically treated.  This  was found to be present at the facet of L2-3 level on the left side.  The L3-4 facet was found to be present just medial to the pedicle screw  at the upper aspect of L4.  Curettage was carried out in order to define  the borders of previous central laminectomy at L3-4 and at L4-5 of size.  A #15 blade scalpel used to deride the areas of fat and posterior scar  tissue to the previous central laminotomy regions.  This was done where  possible.  Next, osteotomes were used to resect the inferior articular  process of L3 both sides and the superior articular process of L4  identified bilaterally.  The picture of lateral fusion masses were  identified at each level.  On the right side, a osteotome was used to  carefully resect the bones comprehensively about the rods on the right  side and also around the fascia and nerves to the rod on the right side  of the L4 and L5.  High-speed bur used to remove the bones as well down  to the base of the facet at L4 and L5 pedicle on the right side.  A bone  cutter was used to cut the rod on the right side and then a hex-type  wrench was able to be applied to the posterior aspect of the facet on  the right side of the L4 level and we removed the screw and rod as one  single unit.  The facet at L5 level on the right side when the hex  wrench was used on this the posterior cap removed itself.  Then the rod  was removed from the U-shaped area of the posterior aspect of the screw,  and the screw head itself was identified and hex screwdriver was able to  be applied to this and the screw backed out.  This was done without much  difficulty.  On the left side, the Hexa Socket was able to be used to  remove the caps above the L4 and the L5 level.  Debris was then removed  circumferentially about the rod between these fasteners and the rod was  able to be excised.  Attempt is made  to remove the lower screw of the L5  was successful using the screwdriver after deriding the bone  circumferentially about the base of facets of the screw fastener.  Then  it was loosed and then was abled to be removed without difficulty.  At  the L4 level on the left side, however, the easy out tool being used  unfortunately broke and stuck within the hex portion of the screw  posteriorly, noted that the hex screwdriver was unable to be used at  this because it appeared to stripped the hex insert area.  Then was a  portion of the easy out broken within the hex portion of the screw a  high speed bur was necessary in order to make an opening into the top of  the screw and this was done using diamond tip bur.  A drilling hole into  the top of the screw in the left L4 level irrigating the entire time  protecting soft tissues with sponges.  When this was completed a large  easy out was able to be inserted into the top of the head and then  screws backed up after removing bone circumferentially above the base of  the facet of the fastener to this particular screw.  This completed the  removal of previous hardware and a decision was made to replace the  hardware and we will use a different system and it was necessary to  revise the hardware to allow for implantation of the system to fix the  instrument at the L3-4 level.  Screws were then introduced 6.25 length x  40 mm were introduced at the L4 level bilaterally and then on the left  side at the L5 level, then on the right side at 35 mm x 6.25 was  inserted.  These were placed in all previous screw holes and after the  decortication was carried out over the posterior lateral fusion masses  in order to provide for adequate bed for fusion to the next upper level  L3 from this area.  The L3 transverse process was identified quite  nicely on either side of pocket of the muscle tissue open to allow for  placement of bone graft tear.  Continuation of  decompression was then  carried out at the L3-4 level, again I have to the resecting the  inferior articular process of L3 on the left and right side, the  superior articular process of L4 residual remained, osteotomes were used  to resect the medial portions of the superior articular process of L4  which was causing persistent lateral recess stenosis, this was then  removed following osteotomy which included osteotomes using a 2 mm and 3  mm Kerrison.  Kerrison was then able to be introduced underneath the  residual portions of the superior articular process of L4 both sides in  order to resect bone out into the nerve foramen and osteotomy was  performed onto the superior articular process of L4 both sides at the  superior aspect of the pedicle of L4 both sides opening the foramen on  these levels.  Pars area overlying the neuroforamen on both sides was  also resected at the L3 level in order to further decompress this area.  Note that the area underneath the facet at the L3-4 levels are extremely  tight and with the resection of the facets both sides and the ligamentum  flavum reflected portion underlying the joints at these level continues  to remained impressed upon the L3 nerve roots, required careful  dissection off the nerve root using Penfield 4 as well as hockey stick  neuro probe then resection using 3 mm Kerrison on both sides, fully  decompressing the L3 nerve roots as we exited out the neuroforamen.  The  remaining  bone out to the very end of the facet resection on both sides  impressing on the nerve root and impressing on the reflective portion of  ligamentum flavum both sides causing L3 nerve compression.  When each of  the neuroforamen was well decompressed hockey stick neuro probe to be  passed out at each level.  There was residual remaining portions of the  pars of L4 level on both sides required further resection particularly  on the right side with the L4 nerve root  which felt to be still  significantly compressed within the lateral recess of L3-4 and out the  L4 neuroforamens.  Osteotomes were used to osteotomize medial portions  of the residual portion of the L4 superior articular process as well as  pars overlying the nerve root and may have been also abundant fusion  mass compression down on the right L4 nerve root.  Finding at the  neuroforamen was open using 3 mm Kerrison overlying the L4 nerve root  out until the nerve was felt to be completely free until to be probed  using hockey stick neuro probe and felt to be completely decompressed on  the right side of the L3.  On the left side of the lateral recess  decompression was carried out using 3 mm Kerrison  and then when this  was carried out the neuroforamen on the left side showed only mild  neuroforaminal narrowing.  Decompression with the Kerrison alone was  adequate on this side to decompress the nerve for a hockey stick nerve  probe to be passed out the neuroforamen on the left to the L4 level.  Continuing the dissection superiorly, a small portion in the inferior  aspect of the spinous process of L2 was resected, then osteotomy  performed to the medial 15% of the L2-3 facet for size in order to  decompress the lateral recess to the L3 nerve root both sides.  A  significant amount of hypertrophic ligamentum flavum was found to be  present at the inferior aspect of the lamina of L2 was resected both  sides until the ligament of flavum insertion site was completely  decompressed as well as centrally and the residual ligament of flavum  was then carefully resected off the posterior aspect of the thecal sac  at this level.  Following irrigation then a hockey stick neuro probe was  again passed out both at L4 and L3 neuroforamen demonstrating the  patency and decompression.  A careful examination of the interpedicular  space and the transforaminal region for replacement of the interbody   fusion, noted that the transforminal region was too small to accept the  cage being placed without significant nerve irritation or nerve  retraction.  The posterolateral fusion was felt to be appropriate  especially in light of the patient's abundant posterolateral fusion from  previous surgery.  With this then, pedicle screw insertion was  undertaken, C-arm fluoro brought into the field, all used to make an  initial entry point on the left side of the intersection of the  transverse process of L3 with the lateral aspect of the pedicle of L3 in  the superior articular process of L3 here.  It was noted to be in the  correct position alignment.  Awl was then removed, high-speed bur used  to further deepen the entry point of the awl hole and then a handheld  pedicle finder was then used to probe the pedicle.  A mallet was  necessary to help  to pedicle finder to probe the substantial amount of  cortical bone within the pedicle.  Allow for its introduction, observed  and seen in fluoro to be in excellent position alignment down to  approximately 40 mm of depth.  Thus removed an multiprobe used to probe  the pedicle hole ensuring patency of the pedicle probed hole.  There was  no sign of broaching cortex.  Tapping was performed with the 5.5 tap and  6.25 tap because the patient's bone was felt to be significantly hard.  A 40 mm x 6.25 screw was chosen, decortication was carried out over the  transverse process of L3 over the lateral aspect of the superior  articular process of L3, bone graft was obtained, morselized from facet  and laminectomy sites at L3-4 and L4-5 was carefully placed out of  posterolateral region extending from the transverse process of L3 to  posterolateral fusion mass at L4-5.  This was done for left side and the  screws introduced following the tapping and again rechecking the probe  in the pedicle openings with multi probe ensure patency and no sign of  broaching.  A 40  mm x 6.25 screws were then placed on the left side  obtaining excellent purchase.  On the right side, similarly awl used to  make an initial entry point and observed and on fluoro noted to be in  excellent positional alignment.  A blunt tip pedicle probe was then used  to probe the pedicle to depth of 40 mm.  Observed and noted on fluoro to be in excellent positional alignment tap with a 5.5 tap and 6.5 tap and  a 40 mm x 6.25 screws were then placed following decortication of  transverse process observation that the opening into the pedicle did not  show any sign of broaching within the pedicle itself on probing with  multiprobe.  Following decortication, then a bone graft was then placed  in the transverse process of L3 upto the posterior lateral fusion as to  the L4-5 level closed on the right side.  The pedicle screws were then  placed at L3 level on the right side, careful alignment with previous  pedicle screws.  A 55 mm length of pre contoured was chosen, did require  additional contouring in order to placed and seated within the facet on  the right side of the L3, L4, and L5 levels.  Of note, prior to this the  checking of the soft tissue resistance was carried out.  The screw on  the right side of the L3 level measured at 32, the one on the left at  31.  Those at the L4 and L5 level both sides felt to measure at greater  than 40 at all the segments.  Following irrigation then, each of the  screw fastener was then carefully loosened to accept the curved and bent  rods.  The rods were precontoured and further contoured and lordosed,  and sat within the screw heads at each level on the left side and right  side and the caps are applied.  Caps are positioned quite nicely.  Then  beginning at the L3 level torquing of the fastener cap to the rod was  performed to 80 foot pounds, first on the left at L3, the left at L4,  and then the left at L5, obtaining excellent purchase here.  Then on  the  right side of the L3, then at the L4 and L5.  Irrigation again  carried  out, then careful inspection of the neruroforamen was again carried out,  so further decompression was carried down on the left side of L3-4 level  as well as the right side to further ensure that the L3 nerve roots was  actually without further nerve compression.  This completed the careful  inspections and all bleeders were controlled using bipolar  electrocautery.  Thrombin-soaked Gelfoam was also used in the area and  excess Gelfoam was removed, small amount placed over the laminotomy  region posteriorly at the end of the case.  Intraoperative C-arm fluoro  permanent images obtained in AP and lateral plains documenting the  hardware alignment and position.  A marker on the right side was placed.  The patient was noted to have Cell Saver changed during the time where  high speed bur and diamond bur was used for removal of the single screw  on the left side of the L4 level which was done because of residual  titanium that was present within the incision that likely was suctioned  into the patient's Cell Saver initially requiring a change of bag and  filter.  With this then, Vitoss was also used to the end to the case  noted aspiration was performed through the left L3 pedicle at the  beginning of the pedicle screw placements using the aspiration  equipment, a Tuohy needle type device, used to aspirate 10 mL of bone  marrow aspirate for the left L3 transpedicular vertebral body.  This was  used to charge 10 mL of Vitoss.  Vitoss then used over the  posterolateral region extending from L3-4 up to the L4-5 level.  When  this completed then a medium Hemovac drain was placed in the depth of  the incision over the right side.  Perilumbar muscle was approximated in  the midline overlying the previous laminotomy defects loosened using #1  Vicryl.  On the dorsal fascia, reapproximated to the paraspinous  ligaments at  the L2-3 and L1-2 levels superiorly as well as to S1  inferiorly.  Lumbodorsal fascia was approximated to self using  interrupted #1 Vicryl suture.  Deep subcu layer approximated with  interrupted #1 Vicryl suture.  More superficial layers with the  interrupted 0 Vicryl suture and 2-0 Vicryl sutures.  Skin was closed  with the running subcut stitches 4-0 Vicryl.  Skin also was closed then  using Dermabond, 4 x 4s ABD pad fixed to skin with Hypafix tape and  drained a lot into the dressing.  Hypafix tape on the left side.  Drain  was charged.  All instruments and sponge counts were correct.  Loop  magnification and headlamp used throughout the case.  The patient was  then returned to supine position, reactivated and extubated and returned  to recovery room in satisfactory condition.  All instruments, sponge  counts were correct.  Note that intraoperatively time out was carried  out identifying the patient and  procedure to be performed, note that  variance from the initial procedure performed was determined to be  necessary during the procedure as the patient did not have adequate room  to allow for placement of TLIF without significant nerve root retraction  on the right side.  It was felt that posterolateral fusion was adequate  for this particular case.  Intraoperative neuro monitoring demonstrated  no significant changes throughout the entire case.  Soft tissue  resistant felt to be adequate at the screws inserted and pervious screws  replacements.  Kerrin Champagne, M.D.  Electronically Signed     JEN/MEDQ  D:  04/01/2008  T:  04/02/2008  Job:  [id number]

## 2010-09-14 NOTE — Discharge Summary (Signed)
NAME:  [name], [name]                        ACCOUNT NO.:  [id number]   MEDICAL RECORD NO.:  [id number]                   PATIENT TYPE:  INP   LOCATION:  0460                                 FACILITY:  [facility]   PHYSICIAN:  Madlyn Frankel. Charlann Boxer, M.D.               DATE OF BIRTH:  [birth year]   DATE OF ADMISSION:  01/26/2003  DATE OF DISCHARGE:  01/30/2003                                 DISCHARGE SUMMARY   ADMISSION DIAGNOSES:  1. Severe osteoarthritis of the right knee.  2. Hypertension.  3. Gastroesophageal reflux disease.  4. Hypercholesterolemia.   DISCHARGE DIAGNOSES:  1. Severe osteoarthritis of the __________ knee status post __________ total     knee replacement.  2. Hypertension.  3. Gastroesophageal reflux disease.  4. Hypercholesterolemia.  5. Postop hemorrhagic anemia stable at the time of discharge.   PROCEDURE:  The patient was taken to the operating room on January 26, 2003 and underwent right total knee arthroplasty.  Surgeon was Madlyn Frankel.  Charlann Boxer, M.D.  Assistant was [employer], P.A.-C.  Surgery was done under  general anesthesia.  Hemovac drain x1 was placed at the time of surgery  along with a Stryker Pain Pump.   CONSULTATIONS:  Physical therapy, occupational therapy, social work case  management.   BRIEF HISTORY:  The patient is a 65 year old female being seen by Madlyn Frankel.  Charlann Boxer, M.D. for continuing progressive problems concerning her right knee.  He first saw her the earlier part of the month for her right knee pain.  She  has no known trauma to the knee but has progressive problems that have been  going on now for quite some time which are 8 or 9 years.  She has moved here  recently from the [place] area and referred to Madlyn Frankel. Charlann Boxer, M.D. for  more definitive care.  The patient has undergone previous injections in her  knee which have failed to this point.  She has difficulty climbing stairs,  getting to a standing position from a sitting  position, problems sleeping  due to the pain.  X-rays have shown tricompartmental osteoarthritis of the  right knee with significant loss of osteophyte formation medially as well as  in the patellofemoral joint. Madlyn Frankel Charlann Boxer, M.D. feels it is best to  proceed with surgical intervention at this time and the patient agrees.  Risks and benefits of the surgery have been discussed with the patient and  the patient wishes to proceed.   LABORATORY DATA:  CBC on admission showed a hemoglobin of 12.9, hematocrit  38.7, white blood cell count 4.4, red blood cell count 9.02.  Serial H&H's  were followed throughout her hospital stay.  Hemoglobin and hematocrit did  decline from 9.3 and 27.8 on January 29, 2003.  However, on the same day  hemoglobin and hematocrit were back up to 10.8 and 32.3 status post 1 unit  of packed red  blood cells.  Differential on admission showed neutrophils low  at 36, lymphocytes high at 50.  Coagulation studies on admission showed PTT  slightly high at 39.  Coagulation studies on discharge were PTT 19.8, INR  2.1 on Coumadin therapy.  Routine chemistry was followed throughout hospital  stay.  Glucose ranged from a high of 139 on January 28, 2003 to being high at  115 on January 29, 2003.  Urinalysis showed appearance cloudy with specific  gravity being low at 1.002, otherwise normal.  The patient's blood type is  AB positive with antibody screen negative.  EKG on admission showed normal  sinus rhythm with an inferior infarct, age-undetermined.  Preop chest x-ray  revealed borderline heart size, no acute abnormality of the chest.   HOSPITAL COURSE:  The patient was admitted to [facility] and taken  to the operating room.  She underwent the above-stated procedure without  complication.  The patient tolerated the procedure well and was allowed to  return to the recovery room and then the orthopedic floor in continued  postoperative care.  Postop day #1, the patient  was doing well complaining  of some pain.  As expected hematocrit was 32.  Hemovac was discontinued on  this day.  She was neurovascularly intact distally.  On October 1st, postop  day #2, the patient was doing great.  No complaints though.  Was using CPM.  Hemoglobin and hematocrit 9.9 and 29.4.  Stryker Pain Pump was discontinued  on this day.  The patient was progressing well with physical therapy.  On  January 29, 2003, postop day #3, the patient was doing okay, some nausea  while up.  Hematocrit was 27.8.  Neurovascularly intact distally.  Incision  was clean, dry, and intact.  She was transfused 1 unit of packed red blood  cells on this day.  January 30, 2003 the patient was doing well.  No  complaints and was ready to be discharged home.  Hemoglobin was 10.8,  neurovascularly intact distally.  Incision was clean, dry, and intact with  minimal swelling.   DISPOSITION:  The patient was discharged home on January 30, 2003.   DISCHARGE MEDICATIONS:  1. Vicodin one to two p.o. q.4-6 h. p.r.n. pain, dispense #60.  2. Robaxin 500 mg, dispense #50, one p.o. q.6-8 h. p.r.n. spasm.  3. Coumadin 2.5 mg one p.o. daily at 6 p.m.  4. Trinsicon caplets, dispense #90, one p.o. t.i.d.   DIET:  As tolerated.   ACTIVITY:  The patient is weightbearing as tolerated to the right lower  extremity.  Has [facility] for home care.   WOUND CARE:  The patient is to perform daily dressing changes until no  drainage.  She may shower when no drainage.   FOLLOW UP:  The patient is to follow up with Madlyn Frankel. Charlann Boxer, M.D. two weeks  from the day of surgery.  She is to call our office for an appointment.   CONDITION ON DISCHARGE:  Stable and improved.     Clarene Reamer, P.A.-C.                   Madlyn Frankel Charlann Boxer, M.D.    SW/MEDQ  D:  03/03/2003  T:  03/03/2003  Job:  [id number]

## 2010-09-14 NOTE — Op Note (Signed)
NAME:  [name], [name]                        ACCOUNT NO.:  [id number]   MEDICAL RECORD NO.:  [id number]                   PATIENT TYPE:  INP   LOCATION:  X001                                 FACILITY:  [facility]   PHYSICIAN:  Madlyn Frankel. Charlann Boxer, M.D.               DATE OF BIRTH:  Dec 11, 1945   DATE OF PROCEDURE:  01/26/2003  DATE OF DISCHARGE:                                 OPERATIVE REPORT   PREOPERATIVE DIAGNOSIS:  Right knee osteoarthritis.   POSTOPERATIVE DIAGNOSIS:  Right knee osteoarthritis.   PROCEDURE:  Right total knee replacement.   COMPONENTS:  [employer] Sigma rotating platform system with size 3 femur, size 2.5  tibia, with a size 3 10 mm rotating platform tray with a 35 mm patella.   SURGEON:  Oley Balm. Charlann Boxer, M.D.   ASSISTANT:  Clarene Reamer, P.A.-C.   ANESTHESIA:  General.   ESTIMATED BLOOD LOSS:  300 mL.   TOURNIQUET TIME:  85 minutes at 325 mmHg.   COMPLICATIONS:  None.   DRAINS:  x1 plus the pain pump.   INDICATION FOR PROCEDURE:  [name] is a very pleasant 65 year old black  female who had presented with bilateral knee pain, right worse than left.  Review of her radiographs reveals end-stage osteoarthritis of the right  knee.  She has failed conservative management and after discussing risks and  benefits of total knee replacement, she consented for right total knee  replacement.   PROCEDURE IN DETAIL:  The patient was brought to the operative theatre.  Once adequate anesthesia and preoperative antibiotics, 1 g of Ancef, were  administered, the patient was positioned supine on the operating table.  The  proximal thigh tourniquet was placed.  The right lower extremity was then  prepped and draped in a sterile fashion.  A midline incision was made,  followed by medial parapatellar arthrotomy.  Following the knee exposure,  attention was directed to the femur.  Using the intramedullary guide set at  5 degrees, 10 mm of distal femur was resected.  A sizing  guide was placed  and initially felt to be a size 4 femur.  A size 4 anterior and posterior  cutting block was placed, and the anterior-posterior and anterior chamfer  cuts were made.  Following this the notch preparation was carried out,  lateralizing the femoral component to the lateral aspect of the medial  femoral condyle.  The posterior chamfer cut was then made.  At this point  attention was directed to the tibia.  A very conservative cut was made off  the tibia, which amounted to about a skin cut off the medial plateau, which  was extremely sclerotic, and about 6 mm off the lateral side.  There was  some concern that we may need a couple more millimeters.  Following the  preparation of the tibial surface, a trial femur was placed.  It was noted  that the anterior  cut of the femur was not close to notching, but placement  of the size 4 femur revealed that it was excessively large, particularly in  the medial and lateral plane.  Based on this and the fact that it was an  anterior bone remaining, I went back and cut the femur to a size 3.  I put  the size 3 anterior-posterior cutting block back on, followed by the open  chamfer cut, size 3.  Following these cuts the trial femur was placed and  noted to fit very well.  At this point trial reduction was attempted but the  size 3 tibial tray with a 10 insert was not able to get in, so at this point  2 mm more of bone were resected off the proximal tibia using the measured  resection 0 degree guide.  Following this the trial reduction was carried  out with a size 3 and a 10 mm poly.  The knee came to full extension and had  excellent stability in flexion.  The rotation of the tibia was marked, which  coincided with the medial third of the tibial tubercle.  This was marked on  the tibia.  At this point attention was directed to the patella.  The  patella measured 23 mm.  Approximately 8 mm of bone was resected, leaving  about 14.5 size, so  the patella was determined to be a 35, which replaced  8.5 mm of bone and fit well in the patella.  The drill holes were made.  A  trial reduction was carried out and the patient was noted to have excellent  patellar tracking.  At this point the trial components were removed.  Final  tibial preparation was carried out.  Upon doing this with subluxation of the  tibia anteriorly, it was determined that the size 3 femur was not allowing  [place] to get the rotation that we wanted with the tibial tray.  We downsized to  a 2.5 mm tray, which still fit well on the rim and allowed me to get the  rotation I would like with the tray.  This was pinned into position and  final preparation was carried out with a drill and the keel punch.  Medial  osteophyte was debrided at this point, which was extremely sclerotic.  At  this point trial components were removed.  The knee was copiously irrigated  with normal saline solution.  Final components were brought onto the field  and cement was prepared.  Once the cement was ready and the cut surfaces  dried, the tibial component was cemented into position.  Excessive cement  was removed.  The blue peg was placed and the femoral component was pinned  in its position.  A size 10 rotating platform trial liner was then placed  and the knee brought out to full extension for compression across the joint.  The patella was dried and the patellar component was then cemented into  position.  Once the cement had cured, excessive cement was removed.  The  trial liner was removed to allow for the removal of cement posteriorly.  At  this point the knee was irrigated and the final 10 mm size 3 tibial poly was  then inserted.  The knee was reduced without difficulty.  The knee came to  full extension, had flexion of about 110 degrees on the table.  The knee was  copiously irrigated with normal saline solution.  A drain was placed deep. Extensor mechanism  was then reapproximated  using #1 PDS.  The pain pump was  then placed superficial to the extensor mechanism.  The subcutaneous layer  was reapproximated using 2-0 Vicryl and a running 4-0 Monocryl for the skin.  The knee was noted to flex again to about 110 degrees with gravity flexion  and came to full extension.  The knee was cleaned, dried, and dressed  sterilely with Steri-Strips, dressing sponges, bulky Jones dressing, and a  knee immobilizer.  The patient was extubated and transferred to the recovery  room in stable condition.                                               Madlyn Frankel Charlann Boxer, M.D.    MDO/MEDQ  D:  01/26/2003  T:  01/26/2003  Job:  [id number]

## 2010-09-14 NOTE — Op Note (Signed)
NAMESAMIRA, ACERO              ACCOUNT NO.:  [id number]   MEDICAL RECORD NO.:  [id number]          PATIENT TYPE:  AMB   LOCATION:  DAY                          FACILITY:  [facility]   PHYSICIAN:  Madlyn Frankel. Charlann Boxer, M.D.  DATE OF BIRTH:  02/13/1946   DATE OF PROCEDURE:  02/13/2006  DATE OF DISCHARGE:                                 OPERATIVE REPORT   PREOPERATIVE DIAGNOSIS:  Right foot second tarsal metatarsal osteoarthritis  with a painful osteophyte and neuritis.   POSTOPERATIVE DIAGNOSIS:  Right foot second tarsal metatarsal osteoarthritis  with a painful osteophyte and neuritis.   PROCEDURES:  1. Right foot second tarsal metatarsal joint open debridement.  2. Osteophytectomy with a cheilectomy type procedure performed on the      middle cuneiform.  3. Neurolysis.   SURGEON:  Durene Romans, M.D.   ASSISTANT:  Yetta Glassman. Loreta Ave, PA-C   ANESTHESIA:  General LMA.   COMPLICATIONS:  None.   INDICATIONS FOR PROCEDURE:  Ms. Centola is a 65 year old female patient of  mine who has a history of knee surgery.  She presented with a painful dorsal  foot mass.  This was worked up on MRI evaluation to make sure this was not a  neuroma in situ.  It confirmed a large osteophyte complex on the dorsal  aspect of the second metatarsal tarsal joint.  Given the painful nature of  this and inability to wear shoes she wished to have something performed.  Despite attempts at conservative measures of using soft cushions, etc.  nothing seemed to help.  Consent was obtained for debridement.   PROCEDURE IN DETAIL:  The patient was brought to the operating theatre.  Once adequate anesthesia and preoperative antibiotics were administered the  patient was positioned supine and thigh tourniquet placed.  The right lower  extremity was then prepped and draped in standard fashion just to the mid  calf.   The leg was exsanguinated and tourniquet elevated.  Sharp dissection was  carried through the skin and  then using some Stevens scissors I dissected  through the soft tissues retracting the neurovascular bundle laterally.  Continued dissection was carried down to the joint capsule which was then  incised in line with the incision using a knife.  The large osteophyte  complex on the dorsal aspect of the tarsal joint was identified.  The joint  space was identified and a capsulotomy carried out.  An osteophytectomy was  carried out followed by a cheilectomy type procedure debriding into the  joint.  The joint was irrigated, opened and debrided.   Note that the superficial nerve in this distribution was identified and  debrided off of this lesion and freed from the soft tissues.   Following this the wound was irrigated and specifically the joint. I  reapproximated the dorsal soft tissues including the capsule repair. I then  injected the joint with a combination of Celestone and 0.25% Marcaine.  I  then infiltrated the soft tissues following closure with 3-0 Vicryl and 3-0  nylon.   The skin was then injected with Marcaine.  This was injected away  from the  neurovascular bundle.  The wound was then cleaned, dried and dressed  sterilely with Adaptic dressing, sponges and a soft tissue wrap.  The  patient was brought to the recovery room in stable condition and extubated.      Madlyn Frankel Charlann Boxer, M.D.  Electronically Signed     MDO/MEDQ  D:  02/13/2006  T:  02/14/2006  Job:  [id number]

## 2010-09-14 NOTE — Discharge Summary (Signed)
[name], [name]              ACCOUNT NO.:  [id number]   MEDICAL RECORD NO.:  [id number]          PATIENT TYPE:  OIB   LOCATION:  5010                         FACILITY:  [facility]   PHYSICIAN:  Wende Neighbors, P.A. DATE OF BIRTH:  [birth year]   DATE OF ADMISSION:  10/05/2004  DATE OF DISCHARGE:  10/09/2004                                 DISCHARGE SUMMARY   ADMISSION DIAGNOSIS:  1.  Lumbar spinal stenosis at L3-4 involving primarily the lateral recesses      above A previous lumbar fusion at L4-5.  2.  Hypertension.  3.  Hiatal hernia.  4.  Status post right total knee arthroplasty.   DISCHARGE DIAGNOSIS:  1.  Bilateral-lateral recess stenosis L3-4 with foraminal entrapment      affecting both the L3-4 and L4 nerve roots on the left. Also affecting      the right L4 nerve root.  2.  Right L3-4 dural tear over the posterior aspect of the thecal sac at the      upper aspect of the right L3-4 facet measuring approximately 3 mm in      length.  3.  Lumbar spinal stenosis at L3-4 involving primarily the lateral recesses      above A previous lumbar fusion at L4-5.  4.  Hypertension.  5.  Hiatal hernia.  6.  Status post right total knee arthroplasty.  7.  Postoperative urinary retention requiring Foley catheterization  8.  Hypokalemia treated with supplementation.   PROCEDURE:  On October 05, 2004 the patient underwent  1.  Central decompressive laminectomy at L3-4 with bilateral-lateral recess      decompression.  2.  Decompression of bilateral L3 and bilateral L4 nerve roots via      foraminotomies.  3.  Repair of dural tear, right L3-4 utilizing 4-0 Nurolon suture __________      performed by Dr. Otelia Sergeant, assisted by Maud Deed PA-C under general      anesthesia.   CONSULTATIONS:  None.   BRIEF HISTORY:  Patient is 65 year old female status post lumbar laminectomy  at L4-5 for spondylolisthesis, followed by a lumbar fusion at L4-5. The  patient was noted to have persistent  and progressive low back pain radiating  into both lower extremities involving foot dorsiflexion as well as knee  extension and having a tendency for her fall. EMG and nerve conduction  studies indicated chronic L5 radiculopathy. MRI study demonstrated foraminal  entrapment on the left side at L3-4, with further review of this study  showing bilateral-lateral recess stenosis at L3-4 above the previous L4-5  fusion segment. It was felt that she would require surgical intervention,  and was admitted for the procedure as stated above.   BRIEF HOSPITAL COURSE:  The patient tolerated the procedure under general  anesthesia. A dural tear was repaired intraoperatively and, therefore, the  patient was placed at bedrest for the first postoperative day. The patient  had difficulty with urinary retention, and a Foley catheter was inserted on  the first postoperative day. She was noted to have decreasing leg pain  throughout the hospital stay, however,  she continued to have decreased  sensation at the plantar aspect of her right foot with motor function  intact. The patient remained at bedrest for the first couple of days and  then physical therapy was initiated. She was slow to progress with physical  therapy, however, was essentially able to be out of bed ambulating with a  walker. She received occupational therapy for ADLs. Her dressing was changed  daily and the wound was found to be healing well without signs of infection.   The patient had difficulty with constipation and laxatives and stool  softeners were used; and the patient was successful in bowel movement during  the hospital stay. The patient had mild low-grade temperatures throughout  the first couple of days. She was treated with incentive spirometry and the  temperatures returned to normal prior to discharge. Hypokalemia was noted  and oral supplementation was given prior to her discharge with instructions  for her to increase her  diet to potassium-rich food at discharge. IV  analgesics were discontinued and the patient was gradually weaned to p.o.  analgesics without difficulty.   On October 09, 2004 the patient had been ambulating in the hallway. Her wound  was healing well. Neurovascular motor function of lower extremities was  noted to be intact. She was afebrile with vital signs stable. The patient  was voiding and having bowel movements, and was able to be discharged to her  home in stable condition.   PERTINENT LABORATORY VALUES:  On admission CBC within normal limits.  Hemoglobin 12.6, hematocrit 37.8 on admission. Postoperatively, hemoglobin  and hematocrit 11.1 and 33 respectively. Chemistry studies on admission were  within normal limits. Repeat chemistry on June 10 was within normal limits  as well. Urinalysis without urinary tract infection on admission. No chest x-  ray on the chart at the time of this dictation. No EKG available on this  chart as well.   PLAN:  The patient was discharged to home. Arrangements were made for a home  health physical therapy evaluation. She will change her dressing daily and  will be allowed to shower. She is encouraged to ambulate with her walker as  much as tolerated and to avoid bending, lifting or twisting. She will not be  allowed to drive until she returns to the office. She will continue on a  regular diet and add potassium-rich foods to her diet.   PRESCRIPTIONS:  1.  Percocet 5/325 one to two q.4-6 h. as needed for pain.  2.  Robaxin 500 mg 1 every 8 hours as needed for spasm.  3.  She will resume her home medications.  4.  She will use an over-the-counter stool softener twice daily.  5.  She is encouraged to use a laxative as needed.   FOLLOWUP:  She will follow up with Dr. Otelia Sergeant 2 weeks' from the date of  surgery; and has been advised to call to arrange the appointment.   CONDITION ON DISCHARGE:  Stable      Wende Neighbors, P.A.    SMV/MEDQ  D:   12/20/2004  T:  12/21/2004  Job:  [id number]

## 2010-09-14 NOTE — H&P (Signed)
NAME:  [name], [name]                        ACCOUNT NO.:  [id number]   MEDICAL RECORD NO.:  [id number]                   PATIENT TYPE:  INP   LOCATION:  NA                                   FACILITY:  [facility]   PHYSICIAN:  Madlyn Frankel. Charlann Boxer, M.D.               DATE OF BIRTH:  [birth year]   DATE OF ADMISSION:  01/26/2003  DATE OF DISCHARGE:                                HISTORY & PHYSICAL   CHIEF COMPLAINT:  Pain in both knees, moreso in the right than left.   PRESENT ILLNESS:  This is a 65 year old lady who has been seen by Dr. Charlann Boxer  for continuing problems concerning her right knee.  She first saw [place] the  early part of this month for right knee pain.  She has no known trauma to  the knee but has progressive problems that have been going on now for quite  some time, eight or nine years.  She has moved here recently from the  [place] area and was referred to Dr. Charlann Boxer for more definitive care.  The  patient has been seen by Dr. Cyril Loosen and has had injections in the  knees in the past.  She has difficulty climbing stairs, getting to a  standing position from a sitting position, and problems sleeping due to the  pain.  Examination shows tenderness on range of motion.  She can only flex  to about 100 degrees on the right.  X-rays show tricompartmental arthritis  of the right knee with significant loss of height and osteophyte formation  medially as well as in the patellofemoral joint.  Due to the fact that the  patient has failed conservative care, which includes injections to the knee  and anti-inflammatories, it is felt she would benefit from surgical  intervention and is being admitted for total knee replacement arthroplasty  to the right knee.  She has been seen by Dr. Fleet Contras of [facility]  here in [place] for this surgery and, according to the patient, he will  follow along with [place] during this patient's hospitalization.   PAST MEDICAL HISTORY:  The patient's  current medications are:  1. Atacand HCT 32/12.5 mg daily.  2. Prevacid NapraPAC 500 daily.  3. Cyclobenzaprine 100 mg three times daily for pain.   Medically she has:  1. GERD.  2. Hypertension.   She is allergic to PENICILLIN.   PAST SURGERIES:  Lumbar surgeries in 1998 and the year 2000, resulting in a  fusion in 2002.   FAMILY HISTORY:  Positive for heart disease, hypertension, diabetes, and  cancer.   SOCIAL HISTORY:  The patient is divorced.  She currently is disabled.  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  She has no intake of tobacco or alcohol  products.  She has two children.  Her family will be assisting  as the  caregiver at home along with [facility].   REVIEW OF SYSTEMS:  CNS:  No seizure disorder, paralysis, numbness, or  double vision, but she does have continuing, persistent low back pain with  radiculitis. CARDIOVASCULAR:  No chest pain.  No angina.  No orthopnea.  GASTROINTESTINAL:  No nausea, vomiting, melena, bloody stool.  GENITOURINARY:  No discharge, dysuria, hematuria.  MUSCULOSKELETAL:  Primarily in present illness.   PHYSICAL EXAMINATION:  GENERAL:  Alert and cooperative, friendly, slightly  obese 65 year old black female.  VITAL SIGNS:  Blood pressure 138/76, pulse 76, respirations are 12.  HEENT:  Normocephalic.  PERRLA.  EOMs intact.  The oropharynx is clear.  CHEST:  Clear to auscultation.  No rhonchi.  No rales.  HEART:  Regular rate and rhythm.  No murmurs are heard.  ABDOMEN:  Obese, soft, nontender.  Liver and spleen not felt.  GENITOURINARY, RECTAL, PELVIC, BREASTS:  Not done, not pertinent to present  illness.  EXTREMITIES:  Right knee as in present illness above.   ADMITTING DIAGNOSES:  1. Severe osteoarthritis of the right knee.  2. Hypertension.  3. Gastroesophageal reflux disease.  4. Hypercholesterolemia.   PLAN:  The patient will undergo total knee  replacement arthroplasty of the  right knee.  Plan to have home health with Genevieve Norlander after her regular  surgery.  We welcome Dr. Concepcion Elk to follow along with [place] during this  patient's hospitalization.     Dooley L. Cherlynn June.                 Madlyn Frankel Charlann Boxer, M.D.    DLU/MEDQ  D:  01/25/2003  T:  01/25/2003  Job:  [id number]   cc:   Fleet Contras, M.D.  [address]  [place]  [place] [address]  Fax: [phone]

## 2010-09-14 NOTE — Op Note (Signed)
[name], [name]              ACCOUNT NO.:  [id number]   MEDICAL RECORD NO.:  [id number]          PATIENT TYPE:  AMB   LOCATION:  DAY                          FACILITY:  [facility]   PHYSICIAN:  Lebron Conners, M.D.   DATE OF BIRTH:  04-02-1946   DATE OF PROCEDURE:  03/05/2005  DATE OF DISCHARGE:                                 OPERATIVE REPORT   PREOPERATIVE DIAGNOSIS:  Benign left thyroid nodule, follicular lesion.   POSTOPERATIVE DIAGNOSIS:  Benign left thyroid nodule, follicular lesion.   PROCEDURE:  Left thyroid lobectomy.   SURGEON:  Lebron Conners, M.D.   ASSISTANT:  _________   ANESTHESIA:  General.   DESCRIPTION OF PROCEDURE:  After the patient was monitored and had general  anesthesia and routine preparation and draping of the anterior neck with the  head and neck in extension position, I made a collar type incision across  the anterior neck and crossing both sides of the midline.  I dissected down  through the platysma muscle and then developed flaps superiorly to the  thyroid cartilage and inferiorly to the sternal notch exposing the  sternocleidomastoid muscles.  I incised the neck in the midline and came  down on the thyroid isthmus.  I felt the right lobe of the gland and it felt  normal.  There was a large nodule in the left lobe.  I dissected anterior to  the thyroid and first addressed the area of the mid part of the gland which  was obscured somewhat by a large nodule.  I then dissected cephalad and  identified and surrounded ligated, and clipped the superior thyroid vessels  and that provided some mobility in the gland.  I then was able to deliver it  anteriorly and divided a couple of veins draining laterally between small  clips.  I then worked near the inferior pole and segmentally divided small  vessels between clips and delivered the gland anteriorly.  I located the  recurrent laryngeal nerves coursing cephalad and anteriorly through the  deltoid of  the tracheoesophageal groove and I protected from harm.  I then  reflected the gland up off the trachea and placed a Kelly clamp across the  isthmus and removed the left lobe and sent it for frozen section.  I suture  ligated the isthmus was two sutures of 3-0 Vicryl.  We then irrigated the  wound and got good hemostasis. I did not see any definite parathyroid  glands.  I closed the wound with running 3-0 Vicryl in the midline and the  platysma  after placing this small amount of Surgicel in the operated area.  I closed  the skin with intracuticular 4-0 Vicryl and Steri-Strips.  Dr. Quentin Ore called  and said that on frozen section the lesion was a follicular lesion which  appeared to be benign.  The patient was stable throughout the procedure.  Blood loss was minimal.  There were no complications.      Lebron Conners, M.D.  Electronically Signed     WB/MEDQ  D:  03/05/2005  T:  03/05/2005  Job:  [id number]  cc:   Fleet Contras, M.D.  Fax: [phone]

## 2010-09-14 NOTE — Discharge Summary (Signed)
[name], DAILY              ACCOUNT NO.:  [id number]   MEDICAL RECORD NO.:  [id number]          PATIENT TYPE:  INP   LOCATION:  1610                         FACILITY:  [facility]   PHYSICIAN:  Kerrin Champagne, M.D.   DATE OF BIRTH:  30-Apr-1945   DATE OF ADMISSION:  06/24/2006  DATE OF DISCHARGE:  06/27/2006                               DISCHARGE SUMMARY   ADMISSION DIAGNOSES:  1. 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  2. Hypothyroidism, status post partial thyroidectomy.  3. Hypertension.  4. Coronary artery disease with stress test indicating ejection      fraction of 67%.  5. Obesity.  6. Status post right total knee arthroplasty.   DISCHARGE DIAGNOSIS:  1. 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  2. Hypothyroidism, status post partial thyroidectomy.  3. Hypertension.  4. Coronary artery disease with stress test indicating ejection      fraction of 67%.  5. Obesity.  6. Status post right total knee arthroplasty.  7. Postoperative nausea, stabilized at discharge.  8. Postoperative atelectasis per chest x-ray elevated temperatures,      requiring use of incentive spirometry.   PROCEDURE:  On June 24, 2006, the patient underwent right C5-6 and  right C6-7 [place] foraminotomies with microscope performed by Dr.  Otelia Sergeant, assisted by Maud Deed, PA-C, under general anesthesia.   CONSULTATIONS:  None.   BRIEF HISTORY:  The patient is a 65 year old female, status post  previous lumbar surgery by Dr. Otelia Sergeant.  She presented approximately 6  months prior to admission with increasing neck pain with radiation into  the right arm.  Conservative management including therapy, select nerve  root blocks, steroids orally as well as anti-inflammatory medication and  pain medication did not give her relief of her symptoms.  MRI  scan  demonstrated right-sided foraminal narrowing involving both C5-6 and C6-  7.  Her clinical exam indicated C7 radiculopathy with EMG and nerve  conduction studies confirming such.  It was felt she would benefit from  surgical intervention and was admitted for the procedure as stated  above.   BRIEF HOSPITAL COURSE:  The patient tolerated the procedure under  general anesthesia without complications.  Postoperatively, she had  improvement in the right arm symptoms.  She did complain of neck pain.  She initially used the PCA pump for her pain control; this was  discontinued when she was weaned to oral medication without difficulty.  She was given a soft collar to wear for comfort.  Dressing changes were  done daily and her wound was found to be healing well without any signs  of infection.  The patient did develop a fever as high as 101.2 with  some cough and mild sputum production.  Chest x-ray was performed to  rule out pneumonia and did show atelectasis.  She was treated with  incentive spirometry and improved.  The patient had nausea which was  treated with Reglan and  Zofran.  She was having flatus and did not have  any distention of her abdomen.  She was able to begin taking a regular  diet on the second postoperative day.  By the third postoperative day,  she was feeling much better.  She was afebrile and vital signs stable.  Neurovascular and motor function of the upper extremities was intact.  She was able to be discharged home for continuation of her care.   PERTINENT LABORATORY VALUES:  On admission, CBC within normal limits.  Routine chemistry studies on admission with glucose of 120, otherwise  values within normal limits.  Urinalysis on February 28 was negative for  urinary tract infection.   Chest x-ray on February 28 with a low-volume exam with bibasilar  atelectasis.   PLAN:  The patient was discharged to her home.   DISCHARGE MEDICATIONS:  She was given  prescriptions for:  1. Vicodin one to two every 4-6 hours as needed for pain.  2. Robaxin 500 mg one every 8 hours as needed for spasm.  3. Reglan 10 mg one every 6 hours as needed for nausea.  4. She will resume her home medications as taken prior to admission      and was given written instructions for this.   FOLLOWUP:  She will follow up with Dr. Otelia Sergeant 2 weeks from the date of  her surgery and was given the number to call to arrange the appointment.   WOUND CARE:  She was advised to cover her dressing for showering  purposes and to keep her wound covered at all times.  She was encouraged  in no lifting or overhead activity.  Soft collar as needed for pain.   DIET:  The patient was advised to have a low-carbohydrate diet, as she  did have some increase in her blood sugar during the hospital stay.   ACTIVITY:  No lifting or driving.   All questions encouraged and answered.      Wende Neighbors, P.A.      Kerrin Champagne, M.D.  Electronically Signed    SMV/MEDQ  D:  08/13/2006  T:  08/13/2006  Job:  [id number]

## 2010-09-14 NOTE — Discharge Summary (Signed)
[name], [name]              ACCOUNT NO.:  [id number]   MEDICAL RECORD NO.:  [id number]          PATIENT TYPE:  INP   LOCATION:  1615                         FACILITY:  [facility]   PHYSICIAN:  Lebron Conners, M.D.   DATE OF BIRTH:  January 17, 1946   DATE OF ADMISSION:  03/05/2005  DATE OF DISCHARGE:  03/06/2005                                 DISCHARGE SUMMARY   HISTORY:  A 65 year old black female who had a thyroid nodule which was  found to be a significant nonfunctioning nodule, and she wanted excision.  It was felt to be solid by CT scan.  She was admitted to the hospital on  March 05, 2005, for a thyroidectomy.  See history and physical for more  details.   HOSPITAL COURSE:  On March 05, 2005, the patient underwent a left thyroid  lobectomy, and the operation and postoperative course went well.  It was  found to be a follicular lesion on frozen section examination, and it was  not felt that it was indicated to do any further surgery.  Final pathology  showed an adenomatoid nodule.  There was a background of chronic  inflammation.  On the day after surgery, the patient was doing well enough  to go home, tolerating adequate fluids, and was discharged with followup  arranged in the office.   DIAGNOSIS:  Benign thyroid nodule.   OPERATION:  Left thyroid lobectomy.   DISCHARGE CONDITION:  Stable and improving.      Lebron Conners, M.D.  Electronically Signed     WB/MEDQ  D:  03/19/2005  T:  03/20/2005  Job:  [id number]

## 2010-09-14 NOTE — Assessment & Plan Note (Signed)
HISTORY:  A 65 year old female seen by me on initial evaluation October 15, 2005.  She has a history of L4-L5 fusion and notes from Dr. Otelia Sergeant indicate  that she has had L4-S1 fused.  The patient is felt to have facet arthropathy  of the level above the fusion, i.e. L3-L4.  Lumbar facet injections were  recommended.  The patient states that she has been through injections before  and knows other people that have not had improvement with them and does not  want to try these.  She has tried physical therapy in the past and states it  has not helped her at all.  She has tried hydrocodone and it did not help  her when she took 7.5 mg dosing, but when she went up to 10, it was too  strong for her and she did not want to take these.  I did start her on  Ultram 50 mg two p.o. t.i.d. and she states that taking two at a time makes  her feel funny so she just takes one to two tablets twice a day rather than  three times a day.   INTERVAL MEDICAL HISTORY:  Otherwise negative.   Pain level is 9/10.   REVIEW OF SYSTEMS:  Noted above.  She health and history form for  additional.   PHYSICAL EXAMINATION:  Blood pressure 108/63, pulse 75, respirations 16, O2  sat 99% on room air.  Her back  has no tenderness to palpation except at  around L3-L4 area.  She is able to do forward flexion but, with extension,  she has increased pain and only has 25% of normal range.  She has normal  lower extremity strength.  Her gait is without instability.   IMPRESSION:  Lumbar facet arthropathy L3-L4 level, i.e. other level above  fusion.  I went over treatment options including diagnostic and therapeutic  facet injections, radiofrequency neurotomy, and she was not interested in  pursuing these.   I went over spine model indicating area of her pain and how it relates to  her fusion.   Offered further medication adjustments; however, she was satisfied with just  the Ultram 50 mg  b.i.d.   Went over recommendation  for pain psychology.  She states that she has  strong religious faith and would like to instead use this to help her cope  with her pain problems.   Offered physical therapy but she was not interested in this and has tried it  before.   The patient will follow up with Dr. Otelia Sergeant in regard to her back pain.  She  was wondering if there is any kind of surgery that can be done for her to  fix her pain.   If she desires further medication, she could obtain this from her primary  physician, Dr. Gareth Eagle.  We basically just have her on her 50 mg Ultram twice a  day and she states that this is adequate at this time.  I will see her back  on a p.r.n. basis.      Erick Colace, M.D.  Electronically Signed     AEK/MedQ  D:  11/04/2005 14:27:27  T:  11/04/2005 15:57:37  Job #:  [id number]   cc:   Kerrin Champagne, M.D.  Fax: [phone]   Madlyn Frankel. Charlann Boxer, M.D.  Fax: [phone]   Lindaann Slough, M.D.  Fax: [phone]

## 2010-09-14 NOTE — Op Note (Signed)
[name], BEAUCHESNE              ACCOUNT NO.:  [id number]   MEDICAL RECORD NO.:  [id number]          PATIENT TYPE:  AMB   LOCATION:  [facility]                         FACILITY:  [facility]   PHYSICIAN:  Lindaann Slough, M.D.  DATE OF BIRTH:  1945-11-14   DATE OF PROCEDURE:  12/02/2005  DATE OF DISCHARGE:                                 OPERATIVE REPORT   PREOPERATIVE DIAGNOSIS:  Pelvic pain.   POSTOPERATIVE DIAGNOSIS:  Pelvic pain.   PROCEDURES PERFORMED:  Cystoscopy, urethral dilation, hydraulic bladder  distention.   SURGEON:  Danae Chen, M.D.   ANESTHESIA:  General.   INDICATION:  The patient is a 65 year old female who had been complaining of  pelvic pain, dysuria, and recurrent urinary tract infections.  She was then  treated with antibiotics, and her symptoms have persisted.  She is scheduled  for cystoscopy, hydraulic bladder distension.   PROCEDURE:  Under general anesthesia, the patient was prepped and draped and  placed in the dorsal lithotomy position.  A #22 Wappler cystoscope was  inserted in the bladder.  The bladder mucosa showed evidence of  glomerulations, although it is minimal.  There is no stone or tumor in the  bladder.  The ureteral orifices are in normal position and shape with clear  efflux.  The bladder was then distended for about 10 minutes.  The bladder  capacity is about 850 mL.  The cystoscope was then removed.  The urethra was  dilated up to #30-French, then 400 mg of Pyridium and 15 mL of Marcaine 0.5%  were instilled in the bladder.   The patient tolerated the procedure well and left the OR in satisfactory  condition to post-anesthesia care unit.      Lindaann Slough, M.D.  Electronically Signed     MN/MEDQ  D:  12/02/2005  T:  12/02/2005  Job:  [id number]   cc:   Fleet Contras, M.D.  Fax: [phone]

## 2010-09-14 NOTE — Group Therapy Note (Signed)
[name] is a 65 year old female who has a history of low back and leg  pain of several year duration.  She has had treatment in [facility] but then  started seeing Dr. Otelia Sergeant in [place], after she has had right knee and hip  osteoarthritis treated by Dr. Charlann Boxer.  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  She has had recent x-rays  showing a solid union of the fusion masses with intact pedicle screws and  rods.  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  Since that time she has continued to complain of pain in  her back.  Underwent re-evaluation per Dr. Otelia Sergeant.  Impression was  degenerative disk pain, likely at L3-4 level.  Recommendations were for  facet injections as well as CT myelogram versus diskography.  Her CT  myelogram showed some moderate posterior element hypertrophy L3-4 level and  right greater than left encroachment at L3 and L4 nerve roots.  There was no  significant movement at L3-4 or L5-S1 levels.  It was anterolisthesis L4  onto L5 and fusion at both L4-5 and L5-S1 levels noted.   The patient was recently tried on hydrocodone 7.5 which she stated that did  not help when taking 3-4 tablets a day.  She was given a prescription for  10/325 hydrocodone but she states these were too strong for her.  She states  that she is not interested in injections.  She has tried physical therapy in  the past and states it did not help her at all.   PAST MEDICAL HISTORY:  1.  Thyroidectomy for benign thyroid nodules.  2.  She has also had a total knee replacement, per Dr. Lajoyce Corners, January 26, 2003.  3.  Right carpal tunnel syndrome with right carpal tunnel release.  4.  GERD.  5.  High blood pressure.   MEDICATIONS:  1.  Hydrocodone 7.5/325.  2.  Norvasc 10 mg.  3.  Diovan 160/12.5.  4.  Lipitor 20 mg a  day.   The patient's pain diagram demonstrates pain in bilateral upper extremities  and neck as well as the back.  Her sleep is graded as fair.  Relief from the  meds about 30%.  Pain score is 8 to 9 out of 10.  She complains of numbness,  weakness, tingling and less so with trouble walking spasms.  Although  overall her activity level is not where she would like it to be, she is  independent with all self care and mobility.  She has had some constipation  and poor appetite.   SOCIAL HISTORY:  Widowed, lives alone.  No alcohol or drug abuse.  She  states her father was an alcoholic and therefore, she never used alcohol.   PHYSICAL EXAMINATION:  VITAL SIGNS:  Blood pressure 120/62, pulse is 76,  respirations 16, O2 sat 97% on room air.  GENERAL:  Alert and oriented x3.  Mood and affect appropriate.  MUSCULOSKELETAL:  She has reduced sensation bilateral index fingers,  compared to the 5th digit and compared with dorsal 1st web space of the  hand.  She has normal sensation in S1 dermatomes bilaterally but decreased  in the left L3, L4, L5 and right L4-L5.  She has normal strength bilateral  deltoid, biceps, triceps, grip as well as hip flexor,  knee extension, ankle  dorsiflexion.  Her gait is stenotic appearing but no evidence of toe drag or  knee instability.   IMPRESSION:  Lumbar post laminectomy syndrome.  I do think that her primary  complaints in her low back are axial pain lumbosacral area and this likely  correlates with the L3-L4 facet arthropathy which is above the level of her  fusion.   I discussed treatment options which included further physical therapy,  adjusting medications, and performing injections, i.e., fact injections.  As  I discussed with the patient, I do think that the side effects are limiting  her medication utilization and it is curious that just going up from 7.5 to  10 mg of hydrocodone resulted in much more sedation.  The patient does not  want to try any  type of injections of her spine.  She states she has tried  in the past.   1.  We will start by switching her from hydrocodone to Ultram, start at 100      mg t.i.d. may need to workup to q.i.d.  2.  In terms of her upper extremity numbness, given that she has a history      of carpal tunnel, we will repeat nerve conduction studies.  She has had      a right carpal tunnel release.  I do not have any surgical records,      whether this is open or endoscopic release and certainly the left side      has not been done.  We will see whether her upper extremity complaints      are more related to a cervical radiculopathy versus carpal tunnel.  3.  I will see her back for the EMG and followup on how she is doing on the      Ultram.      Erick Colace, M.D.  Electronically Signed     AEK/MedQ  D:  10/15/2005 13:18:59  T:  10/15/2005 19:45:08  Job #:  [id number]   cc:   Kerrin Champagne, M.D.  Fax: [phone]   Fleet Contras, M.D.  Fax: [phone]

## 2010-09-14 NOTE — Op Note (Signed)
NAME:  [name], [name]                        ACCOUNT NO.:  [id number]   MEDICAL RECORD NO.:  [id number]                   PATIENT TYPE:  AMB   LOCATION:  ENDO                                 FACILITY:  [facility]   PHYSICIAN:  Graylin Shiver, M.D.                DATE OF BIRTH:  1945/12/31   DATE OF PROCEDURE:  08/18/2003  DATE OF DISCHARGE:                                 OPERATIVE REPORT   PROCEDURE:  Colonoscopy.   INDICATIONS:  Family history of colon polyps.   Informed consent was obtained after explanation of risks such as bleeding,  infection and perforation.   PREMEDICATION:  The procedure was done immediately after an EGD, with an  additional 50 mcg of Fentanyl and Versed 2.5 mg given.   DESCRIPTION OF PROCEDURE:  With the patient the left lateral decubitus  position, rectal exam was performed, no masses were felt. The Olympus  colonoscope was inserted into the rectum, advanced around a tortuous colon  to the cecum.  Cecal landmarks were identified. The cecum and ascending  colon were normal. The transverse colon was normal. The descending colon,  sigmoid and rectum were normal.  She tolerated the procedure well without  complications.   IMPRESSION:  Normal colonoscopy to the cecum.   I would recommend a follow up colonoscopy again in 5 years in view of the  family history.                                               Graylin Shiver, M.D.    SFG/MEDQ  D:  08/18/2003  T:  08/18/2003  Job:  [id number]   cc:   Fleet Contras, M.D.  [address]  [place]  [place] [address]  Fax: [phone]

## 2010-09-14 NOTE — Op Note (Signed)
NAMEROBYNNE, ROAT              ACCOUNT NO.:  [id number]   MEDICAL RECORD NO.:  [id number]          PATIENT TYPE:  OIB   LOCATION:  5010                         FACILITY:  [facility]   PHYSICIAN:  Kerrin Champagne, M.D.   DATE OF BIRTH:  09-03-1945   DATE OF PROCEDURE:  10/05/2004  DATE OF DISCHARGE:                                 OPERATIVE REPORT   PREOPERATIVE DIAGNOSIS:  Lumbar spinal stenosis at lumbar 3-4 involving  primarily the lateral recesses about a previous lumbar 4-5 fusion.   POSTOPERATIVE DIAGNOSES:  1.  Bilateral lateral recessed stenosis, lumbar 3-4 for foraminal entrapment      affecting both the left lumbar 3 and the left lumbar 4 nerve roots,      right lumbar  and right lumbar 4 nerve roots.  2.  Right lumbar 3-4 dural tear over the posterior aspect of the thecal sac      at the upper aspect of the right lumbar 3-4 facet measuring about 3      millimeters in length.   OPERATION PERFORMED:  1.  Central decompressive laminectomy at lumbar 3-4 with bilateral lateral      recess decompression.  2.  Decompression of bilateral lumbar 3 and bilateral lumbar 4 nerve roots      via foraminotomies.  3.  Repair of dural tear, right lumbar 3-4, utilizing 4-0 Nurolon suture and      Tisseel.   SURGEON:  Kerrin Champagne, M.D.   ASSISTANT:  Wende Neighbors, P. A.   ANESTHESIA:  GOT.   ANESTHESIOLOGIST:  Sheldon Silvan, M.D.   SPECIMENS:  None.   ESTIMATED BLOOD LOSS:  The estimated blood loss was 75 mL.   COMPLICATIONS:  Right L3-4 dural tear about 3 mm due to facet osteotomy  utilizing half inch osteotome; the cut into the bone and the bone caused the  dural tear.   DISPOSITION:  The patient was taken to the recovery room in satisfactory  condition.   HISTORY OF THE PRESENT ILLNESS:  The patient is a 65 year old female who has  undergone previous lumbar laminectomy surgery at L4-5 for spondylolisthesis.  She underwent a lumbar fusion and decompression in 2001.  She  has had  persistent back pain and discomfort, and pain with standing and ambulation.  She has undergone total knee arthroplasty.  She has had persistent pain.  EMG and nerve conduction studies indicate chronic L5 radiculopathy.  She has  a tendency at continued give away of both lower extremities involving foot  dorsiflexion as well as knee extension, and tendency to fall.  She underwent  previous MRI study.  This demonstrated foraminal entrapment on the let side  at L3-4, but on observation of the study it is apparent that she does have  bilateral lateral recess stenosis at the L3-4 level above the previous L4-5  fusion segment.  Her complaints of neurogenic claudication are in keeping  with such.   FINDINGS:  The intraoperative findings are as above.   DESCRIPTION OF OPERATION:  After adequate general anesthesia with the  patient in the prone position using the  Andrews frame, standard preoperative  antibiotics and standard padding of all pressure points.  The patient was  prepped with DuraPrep solution over the lower lumbar spine to the upper  sacral segment, and was draped in the usual manner.  Two spinal needles were  inserted in the upper lumbar segment, these noting the lowest needle to be  at about the L2 level.  The incision was then made ellipsing the old  incision scar approximately 4.5-5 cm in length extending from below the  lowest needle.  The incision was carried sharply down to the lumbodorsal  fascia.  This was incised in the midline overlying the spinous process of  L2 and carried down to L3.  Cobbs were used to elevate the paralumbar  muscles on both sides down to the posterior aspect of the lamina at L2 and  L3.  A clamp was placed over the spinous process of L2 and intraoperative  radiograph was obtained that demonstrated the L2 spinous process.  There was  a very small amount of remaining L3 spinous process beneath this.   The Boss-McCullough retractor was inserted  and used for exposure of the  posterior aspect of the elements at L2 and L3.  A Leksell rongeur was then  used to remove a small portion of the inferior aspect of the spinous process  of L2 and then continued inferiorly to L3 excising the remaining portions of  the spinous process of L3, then thinning the posterior aspect of the lamina  at L3 bilaterally.  The posterior aspect of the lamina of L3 was further  narrowed and thinned using a high-speed bur with a 4 mm diamond-tip bur,  thinning appropriately in the midline until a 3 mm Kerrison could be  introduced in the inferior aspect of the lamina of L3, and used to removed  the central portions of the lamina of L3 at the L3-4 level upwards to the L2-  3 intraluminal region.  The ligamentum flavum at the L2-3 level was  carefully debrided.  The medial facets at the L2-3 level also underwent  partial facetectomy of less than 10% each and debridement of the medial  aspect of the ligamentum flavum at this segment.  We continued inferiorly  wherein the lateral recesses the L2-3 level were decompressed down to the  level of the pars at the L3 level and then continued inferiorly, this  __________  both the medial aspect on the right side of L3-4 and on the left  at L3-4 impinging on the patient's thecal sac and causing severe lateral  recess decompression affecting the L4 nerve root so that a half inch  osteotome was used to remove a portion of the medial aspect of the facet on  the left side at L3, L4 osteotomizing about 15-20% of the facet, then using  2 and 3 mm Kerrisons to debride the medial facet bone that had been  osteotomized.  This was performed on the right side and during the osteotomy  a dural tear occurred in the superior aspect of the facet right at L3-4.  This was over the superior right side medial to the facet, superior aspect.  The tear was approximately 3 mm in length.  The neural elements did herniate through this opening in  the dura.  These were carefully replaced after CSF  had leaked and the neuromas were then replaced carefully into the thecal sac  without difficulty using a Penfield 4.  A single stay suture was then placed  inferior to the dural tear using a 4-0 Nurolon suture.   Then the operating room microscope was draped and brought into the field.  Under direct visualization the dural tear was repaired using three  interrupted 4-0 Nurolon sutures.  Continuation of the decompression in the  lateral recess on the right side was further decompressed out to the level  of pedicle at L4 removing hypertrophic ligamentum flavum here, reflected  portion of the ligamentum flavum over the medial facet at the L3-4 level,  and performing foraminotomy over the left L4 nerve root until a hockey stick  neural probe could be passed out the L3 neural foramen without difficulty as  well as L4.   With this completed attention was returned to the right side where  foraminotomy was performed over the L3 nerve root with excision of the  hypertrophic ligamentum flavum here.  Medial facet resection of about 15-20%  was carried out to the level of the L4 pedicle and decompression of  hypertropic ligament flavum here until the L4 nerve root was felt to be  completely freed on the right side as well as the left.  With this the  thecal sac demonstrated normal pulsation of CSF without __________ over 20  mmHg.  There was no apparent leak from the dural tear area.  Tisseel,  however, was brought onto the field after bone wax was applied to the  bleeding cancellous bone surfaces locally.  Hemostasis was obtained.  The  Tisseel was applied to the posterior aspect of the dural tear repair site  adding to the repair and providing watertight seal.   Again, ging through Valsalva maneuver no leakage was noted.  Irrigation was  performed and closure was performed by approximating the paralumbar muscles  in the midline with interrupted  #1 Vicryl sutures.  The lumbodorsal fascia  was approximated in the midline with interrupted figure-of-eight simple  sutures of #1 Vicryl suture.  The deep subcu layers were approximated with  interrupted #1 Vicryl sutures and the subcu fatty layers with interrupted 2-  0 Vicryl suture, and the skin closed with a running subcu stitch of 4-0  Vicryl.  Tincture of Benzoin and Steri-strips were applied.   The patient was then returned to the supine position, reactivated, extubated  and taken to the recovery room  in satisfactory condition.       JEN/MEDQ  D:  10/05/2004  T:  10/06/2004  Job:  [id number]

## 2010-09-14 NOTE — Op Note (Signed)
NAME:  [name], [name]                        ACCOUNT NO.:  [id number]   MEDICAL RECORD NO.:  [id number]                   PATIENT TYPE:  AMB   LOCATION:  ENDO                                 FACILITY:  [facility]   PHYSICIAN:  Graylin Shiver, M.D.                DATE OF BIRTH:  Sep 08, 1945   DATE OF PROCEDURE:  08/18/2003  DATE OF DISCHARGE:                                 OPERATIVE REPORT   PROCEDURE:  Esophagogastroduodenoscopy with biopsy for CLOtest.   INDICATIONS FOR PROCEDURE:  Chronic heartburn.   CONSENT:  Informed consent was obtained after explanation of the risks of  bleeding, infection, and perforation.   PREMEDICATION:  Fentanyl 75 mcg IV, Versed 7.5 mg IV.   PROCEDURE IN DETAIL:  With the patient in the left lateral decubitus  position, the Olympus gastroscope was inserted into the oropharynx and  passed into the esophagus.  It was advanced down the esophagus, into the  stomach, and into the duodenum.  The second portion and bulb of the duodenum  were normal.  The stomach showed a mild erythema compatible with gastritis.  A biopsy for CLOtest was obtained.  There was a hiatal hernia.  No lesions  were seen in the fundus or cardia on retroflexion.  The esophagus revealed  the esophagogastric junction to be at 35 cm.  The esophageal mucosa looked  normal.  She tolerated the procedure well without complications.   IMPRESSION:  1. Gastritis, CLOtest pending.  2. Hiatal hernia.   PLAN:  I suspect that her heartburn is secondary to gastroesophageal reflux.  I would recommend that she remain on a proton pump inhibitor for control of  her heartburn.                                               Graylin Shiver, M.D.    SFG/MEDQ  D:  08/18/2003  T:  08/18/2003  Job:  [id number]   cc:   Fleet Contras, M.D.  [address]  [place]  [place] [address]  Fax: [phone]

## 2010-09-14 NOTE — Op Note (Signed)
NAMECHRISTABEL, CAMIRE              ACCOUNT NO.:  [id number]   MEDICAL RECORD NO.:  [id number]          PATIENT TYPE:  INP   LOCATION:  1191                         FACILITY:  [facility]   PHYSICIAN:  Kerrin Champagne, M.D.   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 Clinically C7 radiculopathy.   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 Clinically C7 radiculopathy.   PROCEDURE:  Right C5-6 and right C6-7 scolioforaminotomy with use of  microscope.   SURGEON:  Dr. Kerrin Champagne   ASSISTANT:  Ninfa Linden, PA-C.   ANESTHESIA:  General via oral tracheal intubation, Dr. Arta Bruce  supplemented with Marcaine with epinephrine 10 mL locally.   SPECIMENS:  None.   ESTIMATED BLOOD LOSS:  75 mL.   COMPLICATIONS:  None.   DISPOSITION:  The patient returned to the PACU in good condition.   HISTORY OF PRESENT ILLNESS:  The patient is a 65 year old female who has  undergone previous lumbar surgery who presented in November of this year  with increasing neck pain, radiation to the right arm, underwent  conservative management through the attempts at therapy, selective nerve  blocks, steroid dose packs, the use of Lyrica, pain medications, anti-  inflammatory agents without relief.  Underwent MRI study which  demonstrated a right-sided foraminal narrowing involving both C5-6 and  C6-7.  Clinically, her evaluation was most consistent with a C7  radiculopathy and EMGs and nerve conduction studies suggested such.  She  was taken to the operating room to  undergo foraminotomies right C5-6  and C6-7.  She does have degenerative disc changes over three segments.   INTRAOPERATIVE FINDINGS:  The patient was found to have a very large  vessel leash at the right C6-7 level effecting the C7 nerve root, at C5-  6 foraminal entrapment with tight neuroforamen secondary to spondylosis  changes.  Moderate spondylosis changes right C6-7.   DESCRIPTION OF PROCEDURE:  After adequate general anesthesia, the  patient was placed into the prone position.  The Mayfield horseshoe was  used to support the face and skull.  Care taken to keep all pressure off  the orbits.  Neck in slight flexion about 20-30 degrees.  Chest rolls  were used.  All pressure points were well-padded.  Attempts at using TED  hose were unsuccessful in this patient because of the size of her  thighs.  TED hose were tending to cut into her legs causing some  bruising.  Therefore, they were removed.  PAS stockings were used.  She  had Foley catheter placed prior to turning into the prone position.  Arms at the side, well-padded and the sheet used to hold the arms at the  side circumferentially.  The shoulders were strapped down using cloth  tape, ABDs protecting the skin over the shoulders.  Standard prep with  Dura-Prep solution from the  base of the skull to about T3 in the  midline.  Draped in the usual manner, iodine Vi-Drape was used.   Initial incision at the expected C7 level extended superiorly about 4-5  cm through the skin and subcutaneous layers directly down to the spinous  processes of the expected C5, C6 and C7.  On the right side, the  attachments of the paracervical muscles to the spinous process of C7,  C6, C5 and C4 carefully taken down using electrocautery, head lamp and  loupes were used.  Cobb was used to elevate the paracervical muscles off  the posterior aspect and lamina and then each of the muscle attachments  incised, cauterized to prevent any bleeding.  Clamps were placed first  at the C7 and C6.  Intraoperative radiograph demonstrating the expected  level of C6 and C7, however, only C6 was visible on the C-arm fluoro and  with this on  removing the clamp and exposing, it was evident that there  was another smaller spinous process present so that it was difficult to  determine correct level, so that another C-arm view was taken with a  clamp at the highest level possible.  This was observed to be C4.  The  C6 level was then marked with a single suture.  The Moss-McCullough  retractor inserted and under loupe magnification and head lamp, a high  speed burr was then used to remove a small portion of the inferior  medial aspect of the inferior articular process of C5 and of C6 at the  C5-6 level and C6-7 levels.  Thus, to allow for exposure of the  posterior aspect of the ligamentum flavum medially and the superior  articular process of C6 and of C7 respectively.  The operating  microscope was sterilely draped and brought into the field under the  operating room microscope and foraminotomies were performed removing a  small portion of the residual portion of the lamina of C5 superiorly  which was the more dependent area in terms of any bleeding collecting  here.  This area was performed first removing small portion of the  inferior aspect of the lamina laterally at the medial aspect of the  inferior articular process of C5 and then detaching the ligamentum  flavum off the superior aspect of the lamina of C6 and then resecting  the medial portion of the superior articular process of C6 using 1 and 2  mm Kerrison.  The ligamentum flavum was then carefully elevated and  bipolar electrocautery used to cauterize all epidural veins here.  Titanium nerve hook then used to elevate the veins and then  cauterization further carried out using the bipolar electrocautery out  into the neural foramen releasing a neurovascular leash and holding the  nerve down against the uncovertebral joint there.  The uncovertebral  joint quite prominent, the neural foramen  was quite tight and 2 mm and 1 mm Kerrison were used to complete the  decompression out laterally  exposing about 3-4 mm of the nerve root preserving at least 50% of the  facet joint.  Gel foam thrombin soaked was then placed over the nerve  root and the neural foramen after first palpating with Titanium nerve  hook.  The neural foramen was quite free.   Attention then turned to the C6-7 level where again high speed burr was  then used to carefully thin the inferior aspect of the lamina at C6 and  the medial aspect of the inferior articular process of  C6 allowing for  exposure of the posterior aspect of the ligamentum flavum at the C6-7  level, medial aspect of the superior articular process of C7.  One and  two millimeter Kerrison were used to further debride the inferior  portion of the lamina to perform the keyhole opening.  Bone wax applied  to bleeding bone surfaces.  The ligamentum flavum detached off the  superior aspect of the C7 lamina laterally superiorly and then this was  elevated and a small nerve hook used to elevate the patient's epidural  veins in this area, cauterizing with bipolar electrocautery and then  using cautery to further bipolar some very large vessels that were in a  sagittal plane over the posterior aspect of the C7 nerve root holding it  forward against the uncovertebral spurs here.  Further resection of the  inferior articular surface of C6 and C7 was carried out using a high  speed burr, first to thin the area and then to resecting with 1 and 2 mm  Kerrison, preserving at least 50% of the joint and at least 3-4 mm of  the C7 nerve root was visible following the decompression and the  resection of the vascular leash overlying this nerve root.  Titanium  nerve hook could then easily pass out the neural foramen demonstrating  its patency.  Bleeders were controlled using electrocautery and bone wax  where necessary and excess bone wax was removed.  Thrombin soaked gel  foam then placed over each laminotomy region, C6-7 and C5-6  on the  right.  Irrigation was performed.  The Moss-McCullough retractor was  then removed.  Bleeders again controlled using bipolar electrocautery in  areas of the detached portions of the paracervical muscles normally  where they would attach to the patient's lamina, this is where some  bleeding was present and cauterized appropriately.  Once the bleeding  was controlled, there was no active bleeding present.  Soft tissues  allowed to fall back into place.  Ligamentum nuclei was then  approximated in the midline with interrupted #0 Ethibond sutures in a  simple and figure-of-eight fashion.  Deep subcutaneous layer was  approximated with interrupted #0 Vicryl suture, the more superficial  layers with interrupted 2-0 Vicryl sutures and skin closed with  running  subcutaneous stitch of 4-0 Vicryl.  Dermabond was then applied, 4x4s  fixed to the skin with tape.  The patient was then returned to the supine position, reactivated, extubated and returned to the recovery  room in satisfactory condition.  All instrument and sponge counts were  correct.      Kerrin Champagne, M.D.  Electronically Signed     JEN/MEDQ  D:  06/24/2006  T:  06/25/2006  Job:  [id number]

## 2011-01-18 LAB — COMPREHENSIVE METABOLIC PANEL
ALT: 9
AST: 20
CO2: 28
Chloride: 94 — ABNORMAL LOW
GFR calc Af Amer: >60
GFR calc non Af Amer: >60
Sodium: 134 — ABNORMAL LOW
Total Bilirubin: 0.8

## 2011-01-18 LAB — CBC
Hemoglobin: 14.1
MCV: 78.6
RBC: 5.46 — ABNORMAL HIGH
WBC: 4.9

## 2011-02-01 LAB — POCT I-STAT EG7
HCT: 28 % — ABNORMAL LOW (ref 36.0–46.0)
Hemoglobin: 9.5 g/dL — ABNORMAL LOW (ref 12.0–15.0)
O2 Saturation: 90 %
pCO2, Ven: 33.2 mmHg — ABNORMAL LOW (ref 45.0–50.0)
pO2, Ven: 50 mmHg — ABNORMAL HIGH (ref 30.0–45.0)

## 2011-02-01 LAB — DIFFERENTIAL
Basophils Absolute: 0 10*3/uL (ref 0.0–0.1)
Basophils Relative: 1 % (ref 0–1)
Eosinophils Absolute: 0.1 10*3/uL (ref 0.0–0.7)
Eosinophils Relative: 3 % (ref 0–5)
Lymphs Abs: 2 10*3/uL (ref 0.7–4.0)

## 2011-02-01 LAB — URINALYSIS, ROUTINE W REFLEX MICROSCOPIC
Bilirubin Urine: NEGATIVE
Glucose, UA: NEGATIVE mg/dL
Nitrite: NEGATIVE
Protein, ur: NEGATIVE mg/dL
Specific Gravity, Urine: 1.003 — ABNORMAL LOW (ref 1.005–1.030)
pH: 6.5 (ref 5.0–8.0)
pH: 6.5 (ref 5.0–8.0)

## 2011-02-01 LAB — COMPREHENSIVE METABOLIC PANEL
ALT: 8 U/L (ref 0–35)
AST: 18 U/L (ref 0–37)
Alkaline Phosphatase: 59 U/L (ref 39–117)
CO2: 27 mEq/L (ref 19–32)
Chloride: 103 mEq/L (ref 96–112)
GFR calc Af Amer: >60 mL/min (ref >60)
GFR calc non Af Amer: 58 mL/min — ABNORMAL LOW (ref >60)
Glucose, Bld: 88 mg/dL (ref 70–99)
Potassium: 4.1 mEq/L (ref 3.5–5.1)
Sodium: 141 mEq/L (ref 135–145)
Total Bilirubin: 0.8 mg/dL (ref 0.3–1.2)

## 2011-02-01 LAB — BASIC METABOLIC PANEL
BUN: 10 mg/dL (ref 6–23)
Calcium: 7.8 mg/dL — ABNORMAL LOW (ref 8.4–10.5)
Creatinine, Ser: 0.91 mg/dL (ref 0.4–1.2)
GFR calc non Af Amer: >60 mL/min (ref >60)
Glucose, Bld: 96 mg/dL (ref 70–99)

## 2011-02-01 LAB — TYPE AND SCREEN: Antibody Screen: NEGATIVE

## 2011-02-01 LAB — URINE MICROSCOPIC-ADD ON

## 2011-02-01 LAB — PROTIME-INR: Prothrombin Time: 13.9 seconds (ref 11.6–15.2)

## 2011-02-01 LAB — HEMOGLOBIN AND HEMATOCRIT, BLOOD: Hemoglobin: 12.5 g/dL (ref 12.0–15.0)

## 2011-02-01 LAB — CBC
Hemoglobin: 13.9 g/dL (ref 12.0–15.0)
RBC: 5.26 MIL/uL — ABNORMAL HIGH (ref 3.87–5.11)
WBC: 3.9 10*3/uL — ABNORMAL LOW (ref 4.0–10.5)

## 2011-02-11 ENCOUNTER — Other Ambulatory Visit: Payer: Self-pay | Admitting: Nurse Practitioner

## 2011-02-11 DIAGNOSIS — Z1231 Encounter for screening mammogram for malignant neoplasm of breast: Secondary | ICD-10-CM

## 2011-02-27 ENCOUNTER — Ambulatory Visit
Admission: RE | Admit: 2011-02-27 | Discharge: 2011-02-27 | Disposition: A | Discharge status: Home / Self Care | Payer: Medicare Other | Source: Ambulatory Visit | Attending: Nurse Practitioner | Admitting: Nurse Practitioner

## 2011-02-27 DIAGNOSIS — Z1231 Encounter for screening mammogram for malignant neoplasm of breast: Secondary | ICD-10-CM

## 2011-11-10 ENCOUNTER — Encounter (HOSPITAL_COMMUNITY): Payer: Self-pay | Admitting: Emergency Medicine

## 2011-11-10 ENCOUNTER — Observation Stay (HOSPITAL_COMMUNITY)
Admission: EM | Admit: 2011-11-10 | Discharge: 2011-11-10 | Disposition: A | Discharge status: Home / Self Care | Payer: Medicare Other | Attending: Emergency Medicine | Admitting: Emergency Medicine

## 2011-11-10 DIAGNOSIS — I10 Essential (primary) hypertension: Secondary | ICD-10-CM | POA: Insufficient documentation

## 2011-11-10 DIAGNOSIS — K219 Gastro-esophageal reflux disease without esophagitis: Secondary | ICD-10-CM | POA: Insufficient documentation

## 2011-11-10 DIAGNOSIS — E785 Hyperlipidemia, unspecified: Secondary | ICD-10-CM | POA: Insufficient documentation

## 2011-11-10 DIAGNOSIS — X58XXXA Exposure to other specified factors, initial encounter: Secondary | ICD-10-CM | POA: Insufficient documentation

## 2011-11-10 DIAGNOSIS — T783XXA Angioneurotic edema, initial encounter: Principal | ICD-10-CM | POA: Insufficient documentation

## 2011-11-10 DIAGNOSIS — Y929 Unspecified place or not applicable: Secondary | ICD-10-CM | POA: Insufficient documentation

## 2011-11-10 HISTORY — DX: Essential (primary) hypertension: I10

## 2011-11-10 HISTORY — DX: Gastro-esophageal reflux disease without esophagitis: K21.9

## 2011-11-10 HISTORY — DX: Hyperlipidemia, unspecified: E78.5

## 2011-11-10 LAB — CBC
HCT: 37.3 % (ref 36.0–46.0)
MCH: 26.2 pg (ref 26.0–34.0)
MCHC: 33 g/dL (ref 30.0–36.0)
RDW: 15.7 % — ABNORMAL HIGH (ref 11.5–15.5)

## 2011-11-10 LAB — POCT I-STAT, CHEM 8
Glucose, Bld: 106 mg/dL — ABNORMAL HIGH (ref 70–99)
HCT: 38 % (ref 36.0–46.0)
Hemoglobin: 12.9 g/dL (ref 12.0–15.0)
Potassium: 3.8 mEq/L (ref 3.5–5.1)
TCO2: 25 mmol/L (ref 0–100)

## 2011-11-10 MED ORDER — PREDNISONE 20 MG PO TABS: 60.0000 mg | ORAL_TABLET | Freq: Every day | ORAL | Status: AC

## 2011-11-10 MED ORDER — FAMOTIDINE IN NACL 20-0.9 MG/50ML-% IV SOLN: 20.0000 mg | Freq: Two times a day (BID) | INTRAVENOUS | Status: DC

## 2011-11-10 MED ORDER — DIPHENHYDRAMINE HCL 50 MG/ML IJ SOLN
25.0000 mg | Freq: Once | INTRAMUSCULAR | Status: AC
  Administered 2011-11-10: 25 mg via INTRAVENOUS
  Filled 2011-11-10: qty 1

## 2011-11-10 MED ORDER — ALBUTEROL SULFATE HFA 108 (90 BASE) MCG/ACT IN AERS: 1.0000 | INHALATION_SPRAY | RESPIRATORY_TRACT | Status: DC | PRN

## 2011-11-10 MED ORDER — DIPHENHYDRAMINE HCL 25 MG PO TABS: 25.0000 mg | ORAL_TABLET | Freq: Four times a day (QID) | ORAL | Status: DC

## 2011-11-10 MED ORDER — ACETAMINOPHEN 325 MG PO TABS
ORAL_TABLET | ORAL | Status: AC
  Filled 2011-11-10: qty 3

## 2011-11-10 MED ORDER — FAMOTIDINE 20 MG PO TABS: 20.0000 mg | ORAL_TABLET | Freq: Two times a day (BID) | ORAL | Status: DC

## 2011-11-10 MED ORDER — METHYLPREDNISOLONE SODIUM SUCC 40 MG IJ SOLR
40.0000 mg | Freq: Four times a day (QID) | INTRAMUSCULAR | Status: DC
  Administered 2011-11-10: 40 mg via INTRAVENOUS
  Filled 2011-11-10: qty 1

## 2011-11-10 MED ORDER — DIPHENHYDRAMINE HCL 12.5 MG/5ML PO ELIX: 12.5000 mg | ORAL_SOLUTION | Freq: Four times a day (QID) | ORAL | Status: DC | PRN

## 2011-11-10 MED ORDER — ACETAMINOPHEN 325 MG PO TABS
975.0000 mg | ORAL_TABLET | Freq: Once | ORAL | Status: AC
  Administered 2011-11-10: 975 mg via ORAL

## 2011-11-10 MED ORDER — ONDANSETRON HCL 4 MG/2ML IJ SOLN: 4.0000 mg | Freq: Four times a day (QID) | INTRAMUSCULAR | Status: DC | PRN

## 2011-11-10 MED ORDER — FAMOTIDINE 20 MG PO TABS
20.0000 mg | ORAL_TABLET | Freq: Once | ORAL | Status: AC
  Administered 2011-11-10: 20 mg via ORAL
  Filled 2011-11-10: qty 1

## 2011-11-10 MED ORDER — METHYLPREDNISOLONE SODIUM SUCC 125 MG IJ SOLR
125.0000 mg | Freq: Once | INTRAMUSCULAR | Status: AC
  Administered 2011-11-10: 125 mg via INTRAVENOUS
  Filled 2011-11-10: qty 2

## 2011-11-10 NOTE — ED Notes (Signed)
stated tongue feeling tingling, itching and burning sensation and swelling up, started about 730 and 8 pm yesterday and waited to go down but no improvement.

## 2011-11-10 NOTE — ED Provider Notes (Addendum)
History     CSN: [id number]  Arrival date & time 11/10/11  0123   First MD Initiated Contact with Patient 11/10/11 0132      Chief Complaint  Patient presents with  . Allergic Reaction    (Consider location/radiation/quality/duration/timing/severity/associated sxs/prior treatment) HPI Hx per PT, 7pm tonight at home developed swelling and itching in her tongue. Patient concerned she may be allergic to something she had for dinner, a salad. No new foods, medications or known exposures. Swelling slowly getting worse and feels like she's having trouble swallowing. No trouble breathing. Patient presents here for evaluation. Moderate severity. No lip swelling. Has remote history of allergic reaction in the past felt to be due to food. Patient has been taking lisinopril for some time for hypertension. Followed by [facility]. No voice changes. No rash. No known alleviating factors. Past Medical History  Diagnosis Date  . Hypertension   . Hyperlipidemia   . GERD (gastroesophageal reflux disease)     Past Surgical History  Procedure Date  . Back surgery   . Neck surgery   . Abdominal hysterectomy   . Replacement total knee bilateral     History reviewed. No pertinent family history.  History  Substance Use Topics  . Smoking status: Never Smoker   . Smokeless tobacco: Not on file  . Alcohol Use: No    OB History    Grav Para Term Preterm Abortions TAB SAB Ect Mult Living                  Review of Systems  Constitutional: Negative for fever and chills.  HENT: Negative for neck pain and neck stiffness.   Eyes: Negative for pain.  Respiratory: Negative for shortness of breath.   Cardiovascular: Negative for chest pain.  Gastrointestinal: Negative for abdominal pain.  Genitourinary: Negative for dysuria.  Musculoskeletal: Negative for back pain.  Skin: Negative for rash.  Neurological: Negative for headaches.  All other systems reviewed and are  negative.    Allergies  Penicillins and Tramadol hcl  Home Medications  No current outpatient prescriptions on file.  BP 145/66  Pulse 57  Temp 98.1 F (36.7 C) (Oral)  Resp 16  SpO2 99%  Physical Exam  Constitutional: She is oriented to person, place, and time. She appears well-developed and well-nourished.  HENT:  Head: Normocephalic and atraumatic.       Angioedema of the tongue.   Eyes: Conjunctivae and EOM are normal. Pupils are equal, round, and reactive to light.  Neck: Trachea normal. Neck supple. No thyromegaly present.  Cardiovascular: Normal rate, regular rhythm, S1 normal, S2 normal and normal pulses.     No systolic murmur is present   No diastolic murmur is present  Pulses:      Radial pulses are 2+ on the right side, and 2+ on the left side.  Pulmonary/Chest: Effort normal and breath sounds normal. She has no wheezes. She has no rhonchi. She has no rales. She exhibits no tenderness.  Abdominal: Soft. Normal appearance and bowel sounds are normal. There is no tenderness. There is no CVA tenderness and negative Murphy's sign.  Musculoskeletal:       BLE:s Calves nontender, no cords or erythema, negative Homans sign  Neurological: She is alert and oriented to person, place, and time. She has normal strength. No cranial nerve deficit or sensory deficit. GCS eye subscore is 4. GCS verbal subscore is 5. GCS motor subscore is 6.  Skin: Skin is warm and  dry. No rash noted. She is not diaphoretic.  Psychiatric: Her speech is normal.       Cooperative and appropriate    ED Course  Procedures (including critical care time)  IV Solu-Medrol. IV Benadryl. Pepcid provided.  1. Angioedema    She has been placed on observation protocol for symptoms not improving. Serial evaluations with unchanged condition.    MDM   Angioedema. Plan stop lisinopril and avoid ACE inhibitors. presentation fell much less likely to be allergic reaction.     Plan: Discharge home when  starting to feel improvement of tongue swelling    Sunnie Nielsen, MD 11/10/11 5366  Sunnie Nielsen, MD 11/10/11 [phone]

## 2011-11-10 NOTE — ED Notes (Addendum)
Patient reporting allergic reaction symptoms that started around 1900 yesterday evening; tongue swollen.  No airway or breathing difficulties.  Patient states that she takes Lisinopril, but that she has been on the medication for years.  Reports eating a wrap from [employer] earlier this evening.  Patient states that she is allergic to Ultram and Penicillin; has not taken any of these medications.  Patient reports similar allergic reaction episode several years ago; does not remember what she was allergic to at that time.  Patient states that she was going to take some Benadryl on the way to the hospital, but the drug store was closed.

## 2011-11-10 NOTE — ED Notes (Signed)
MD at bedside. 

## 2011-11-10 NOTE — ED Provider Notes (Signed)
8:50 AM patient reports tying feels improved. She is in no respiratory distress. She complains only at mild frontal headache which started since she's been here. On exam she is alert speaks in paragraphs no distress tongue is minimally swollen uvula midline nonswollen Tylenol ordered patient stable for discharge  Doug Sou, MD 11/10/11 [phone]

## 2011-11-10 NOTE — ED Notes (Signed)
Report received from [place], [place]. Pt moved to CDU 9. She is resting quietly at the time. Respirations unlabored. No signs of distress. Pt updated on plan of care, will continue to monitor. Vital signs stable.

## 2011-11-10 NOTE — ED Notes (Signed)
Family at bedside. Md updated pt

## 2011-11-10 NOTE — ED Notes (Signed)
Pt discharged home. Encouraged to follow up with PCP regarding BP medication. Vital signs stable. States she feels much better, prescriptions explained to patient, had no further questions.

## 2011-11-10 NOTE — ED Notes (Signed)
Report given to [facility], RN and pt being transferred to CDU 9.

## 2011-11-18 NOTE — Progress Notes (Signed)
Observation review for 11/10/2011 visit is complete.

## 2012-02-04 ENCOUNTER — Other Ambulatory Visit: Payer: Self-pay | Admitting: Nurse Practitioner

## 2012-02-04 DIAGNOSIS — Z1231 Encounter for screening mammogram for malignant neoplasm of breast: Secondary | ICD-10-CM

## 2012-02-04 DIAGNOSIS — Z803 Family history of malignant neoplasm of breast: Secondary | ICD-10-CM

## 2012-03-03 ENCOUNTER — Ambulatory Visit: Payer: Medicare Other

## 2012-06-03 ENCOUNTER — Other Ambulatory Visit: Payer: Self-pay | Admitting: Family Medicine

## 2012-06-03 ENCOUNTER — Other Ambulatory Visit: Payer: Self-pay | Admitting: Nurse Practitioner

## 2012-06-03 DIAGNOSIS — Z1231 Encounter for screening mammogram for malignant neoplasm of breast: Secondary | ICD-10-CM

## 2012-06-08 ENCOUNTER — Ambulatory Visit: Payer: Medicare Other

## 2012-06-09 ENCOUNTER — Ambulatory Visit
Admission: RE | Admit: 2012-06-09 | Discharge: 2012-06-09 | Disposition: A | Discharge status: Home / Self Care | Payer: Medicare Other | Source: Ambulatory Visit | Attending: Nurse Practitioner | Admitting: Nurse Practitioner

## 2012-06-09 DIAGNOSIS — Z1231 Encounter for screening mammogram for malignant neoplasm of breast: Secondary | ICD-10-CM

## 2012-06-09 DIAGNOSIS — Z803 Family history of malignant neoplasm of breast: Secondary | ICD-10-CM

## 2012-07-07 ENCOUNTER — Encounter (HOSPITAL_COMMUNITY): Payer: Self-pay

## 2012-07-09 ENCOUNTER — Other Ambulatory Visit (HOSPITAL_COMMUNITY): Payer: Self-pay

## 2012-07-09 NOTE — Progress Notes (Addendum)
Anesthesia Chart Review:  Patient is a 67 year old female scheduled for C5-6, C6-7 ACDF by Dr. Otelia Sergeant on 07/20/12.   History includes non-smoker, morbid obesity, HTN, venous insufficiency, HLD, GERD, OA, impaired fasting glucose, non-smoker, hysterectomy, right TKA '04, left TKA '12, right C5-6, C6-7 scolioforaminotomy '08, left thyroid lobectomy for benign follicular lesion '06, L3-4 laminectomy '06 with fusion '09.  I have reviewed her medical clearance note dated 06/03/12 from Elizabeth Palau, NP at [facility]. EKG then showed NSR, LAD, QRS contour abnormality with consideration of inferior infarct.  Anderson, NP did not feel it was significantly changed form a prior EKG from 06/2010.  Echo from 08/22/04 showed: Overall left ventricular systolic function was normal. Left ventricular ejection fraction was estimated , range being 55% to 65 %. Mild TR.  Preoperative labs and CXR noted.  She has been medically cleared by her PCP.  She will be evaluated by her assigned anesthesiologist on the day of surgery, but if no acute change then would anticipate she could proceed as planned.  Velna Ochs [facility]/Anesthesiology Phone [phone] 07/10/2012 1429

## 2012-07-10 ENCOUNTER — Encounter (HOSPITAL_COMMUNITY): Payer: Self-pay

## 2012-07-10 ENCOUNTER — Encounter (HOSPITAL_COMMUNITY)
Admission: RE | Admit: 2012-07-10 | Discharge: 2012-07-10 | Disposition: A | Discharge status: Home / Self Care | Payer: Medicare Other | Source: Ambulatory Visit | Attending: Anesthesiology | Admitting: Anesthesiology

## 2012-07-10 ENCOUNTER — Encounter (HOSPITAL_COMMUNITY)
Admission: RE | Admit: 2012-07-10 | Discharge: 2012-07-10 | Disposition: A | Discharge status: Home / Self Care | Payer: Medicare Other | Source: Ambulatory Visit | Attending: Specialist | Admitting: Specialist

## 2012-07-10 HISTORY — DX: Unspecified osteoarthritis, unspecified site: M19.90

## 2012-07-10 LAB — BASIC METABOLIC PANEL
BUN: 15 mg/dL (ref 6–23)
Calcium: 9.5 mg/dL (ref 8.4–10.5)
Creatinine, Ser: 1.19 mg/dL — ABNORMAL HIGH (ref 0.50–1.10)
GFR calc non Af Amer: 47 mL/min — ABNORMAL LOW (ref >90)
Glucose, Bld: 93 mg/dL (ref 70–99)

## 2012-07-10 LAB — URINALYSIS, ROUTINE W REFLEX MICROSCOPIC
Bilirubin Urine: NEGATIVE
Hgb urine dipstick: NEGATIVE
Nitrite: NEGATIVE
Specific Gravity, Urine: 1.004 — ABNORMAL LOW (ref 1.005–1.030)
Urobilinogen, UA: 0.2 mg/dL (ref 0.0–1.0)
pH: 7 (ref 5.0–8.0)

## 2012-07-10 LAB — CBC WITH DIFFERENTIAL/PLATELET
Basophils Relative: 0 % (ref 0–1)
Eosinophils Absolute: 0.5 10*3/uL (ref 0.0–0.7)
Hemoglobin: 12.9 g/dL (ref 12.0–15.0)
MCH: 26.7 pg (ref 26.0–34.0)
MCHC: 33.2 g/dL (ref 30.0–36.0)
Monocytes Relative: 8 % (ref 3–12)
Neutrophils Relative %: 39 % — ABNORMAL LOW (ref 43–77)
Platelets: 197 10*3/uL (ref 150–400)

## 2012-07-10 NOTE — Pre-Procedure Instructions (Signed)
[name]  07/10/2012   Your procedure is scheduled on:  07-20-2012  Report to [facility] Stay [facility] at 5:30 AM.  Call this number if you have problems the morning of surgery: [phone]   Remember:   Do not eat food or drink liquids after midnight.   Take these medicines the morning of surgery with A SIP OF WATER: pain medication as needed,pantoprazole(Protonix)   Do not wear jewelry, make-up or nail polish.  Do not wear lotions, powders, or perfumes. You may wear deodorant.  Do not shave 48 hours prior to surgery.   Do not bring valuables to the hospital.  Contacts, dentures or bridgework may not be worn into surgery.  Leave suitcase in the car. After surgery it may be brought to your room.   For patients admitted to the hospital, checkout time is 11:00 AM the day of discharge.   Patients discharged the day of surgery will not be allowed to drive home.    Special Instructions: Shower using CHG 2 nights before surgery and the night before surgery.  If you shower the day of surgery use CHG.  Use special wash - you have one bottle of CHG for all showers.  You should use approximately 1/3 of the bottle for each shower.     Please read over the following fact sheets that you were given: Pain Booklet, Coughing and Deep Breathing and Surgical Site Infection Prevention

## 2012-07-17 NOTE — H&P (Signed)
[name] is an 67 y.o. female.   Chief Complaint: neck and left arm pain HPI: Pt with progressive pain and numbness of the left upper extremity.  Severe years of neck pain. S/P posterior cervical foraminotomy at C5-6 and C6-7 with progressive degeneration of disc and arthrosis.  She has failed conservative treatment with NSAIDS and analgesics as well as activity modification. RADIOGRAPHS:  The results of the MRI scan of the cervical spine are reviewed and show mild cervical stenosis changes at both the C5-6 and C6-7 levels with left-sided foraminal entrapment at C6-7, with a very small disc protrusion that extends along the left upper foramen.  Underlying disc bulge causes mild stenosis here.  At C5-6, bilateral foraminal stenosis with mild central stenosis due to disc osteophyte complex.  Intervertebral spurring and facet spurs.  C4-5 is unremarkable.  C7-T1 with some foraminal stenosis due to facet arthrosis that appeared mild and also at the C3-4 level some mild bilateral foraminal stenosis with disc bulge and mild central stenosis related to disc bulge.  A significant  problem is at C6-7 within the foramen and associated with disc protrusion seen here. Pt will benefit from anterior cervical discectomy and fusion at C5-6 and C6-7 with allograft,plate and screws and she wishes to proceed  Past Medical History  Diagnosis Date  . Hypertension   . Hyperlipidemia   . GERD (gastroesophageal reflux disease)   . Arthritis     Past Surgical History  Procedure Laterality Date  . Neck surgery    . Abdominal hysterectomy    . Replacement total knee bilateral    . Back surgery    . Thyroid surgery      History reviewed. No pertinent family history. Social History:  reports that she has never smoked. She does not have any smokeless tobacco history on file. She reports that she does not drink alcohol or use illicit drugs.  Allergies:  Allergies  Allergen Reactions  . Lisinopril Swelling   Swelling in mouth and throat  . Penicillins     REACTION: rash  . Tramadol Hcl     REACTION: rash  . Tramadol Rash    Medications Prior to Admission  Medication Sig Dispense Refill  . aspirin EC 81 MG tablet Take 81 mg by mouth daily.      . diclofenac (VOLTAREN) 75 MG EC tablet Take 75 mg by mouth 2 (two) times daily as needed (neck pain).      [name] HYDROcodone-acetaminophen (NORCO) 7.5-325 MG per tablet Take 0.5 tablets by mouth daily as needed for pain.      [name] lisinopril-hydrochlorothiazide (PRINZIDE,ZESTORETIC) 20-12.5 MG per tablet Take 1 tablet by mouth daily.      . Multiple Vitamins-Minerals (MULTIVITAMINS THER. W/MINERALS) TABS Take 1 tablet by mouth daily.      . pantoprazole (PROTONIX) 40 MG tablet Take 40 mg by mouth 2 (two) times daily.      . simvastatin (ZOCOR) 40 MG tablet Take 40 mg by mouth every evening.        No results found for this or any previous visit (from the past 48 hour(s)). No results found.  Review of Systems  Constitutional: Negative.   HENT: Positive for neck pain.   Eyes: Negative.   Respiratory: Negative.   Cardiovascular: Negative.   Gastrointestinal: Negative.   Genitourinary: Negative.   Skin: Negative.   Neurological: Positive for tingling and sensory change.  Endo/Heme/Allergies: Negative.   Psychiatric/Behavioral: Negative.     Blood pressure 174/91, pulse  61, temperature 97 F (36.1 C), temperature source Oral, resp. rate 18, SpO2 97.00%. Physical Exam  Constitutional: She is oriented to person, place, and time. She appears well-developed and well-nourished.  HENT:  Head: Normocephalic and atraumatic.  Eyes: EOM are normal. Pupils are equal, round, and reactive to light.  Cardiovascular: Normal rate and regular rhythm.   Respiratory: Effort normal and breath sounds normal.  GI: Soft.  Musculoskeletal:  Painful ROM of Cspine.  +Spurlings to left.  Decrease strength in left triceps as compared to right.  Weakness of left finger  extension.  Neurological: She is alert and oriented to person, place, and time.  Skin: Skin is warm and dry.  Psychiatric: She has a normal mood and affect.     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 Patient was seen and examined in the preop holding area. There has been no interval  Change in this patient's exam preop  history and physical exam  Lab tests and images have been examined and reviewed.  The Risks benefits and alternative treatments have been discussed  extensively,questions answered.  The patient has elected to undergo the discussed surgical treatment. NITKA,[name] E 07/20/2012, 7:35 AM

## 2012-07-19 MED ORDER — VANCOMYCIN HCL IN DEXTROSE 1-5 GM/200ML-% IV SOLN
1000.0000 mg | INTRAVENOUS | Status: DC
  Filled 2012-07-19: qty 200

## 2012-07-20 ENCOUNTER — Encounter (HOSPITAL_COMMUNITY): Payer: Self-pay | Admitting: *Deleted

## 2012-07-20 ENCOUNTER — Inpatient Hospital Stay (HOSPITAL_COMMUNITY)
Admission: RE | Admit: 2012-07-20 | Discharge: 2012-07-21 | DRG: 473 | Disposition: A | Discharge status: Home Health | Payer: Medicare Other | Source: Ambulatory Visit | Attending: Specialist | Admitting: Specialist

## 2012-07-20 ENCOUNTER — Inpatient Hospital Stay (HOSPITAL_COMMUNITY): Payer: Medicare Other | Admitting: Certified Registered"

## 2012-07-20 ENCOUNTER — Inpatient Hospital Stay (HOSPITAL_COMMUNITY): Payer: Medicare Other

## 2012-07-20 ENCOUNTER — Encounter (HOSPITAL_COMMUNITY)
Admission: RE | Disposition: A | Discharge status: Home Health | Payer: Self-pay | Source: Ambulatory Visit | Attending: Specialist

## 2012-07-20 ENCOUNTER — Encounter (HOSPITAL_COMMUNITY): Payer: Self-pay | Admitting: Vascular Surgery

## 2012-07-20 DIAGNOSIS — Z96659 Presence of unspecified artificial knee joint: Secondary | ICD-10-CM

## 2012-07-20 DIAGNOSIS — Z01812 Encounter for preprocedural laboratory examination: Secondary | ICD-10-CM

## 2012-07-20 DIAGNOSIS — E785 Hyperlipidemia, unspecified: Secondary | ICD-10-CM | POA: Diagnosis present

## 2012-07-20 DIAGNOSIS — I10 Essential (primary) hypertension: Secondary | ICD-10-CM | POA: Diagnosis present

## 2012-07-20 DIAGNOSIS — M5030 Other cervical disc degeneration, unspecified cervical region: Secondary | ICD-10-CM | POA: Diagnosis present

## 2012-07-20 DIAGNOSIS — Z880 Allergy status to penicillin: Secondary | ICD-10-CM

## 2012-07-20 DIAGNOSIS — M47812 Spondylosis without myelopathy or radiculopathy, cervical region: Secondary | ICD-10-CM | POA: Diagnosis present

## 2012-07-20 DIAGNOSIS — K219 Gastro-esophageal reflux disease without esophagitis: Secondary | ICD-10-CM | POA: Diagnosis present

## 2012-07-20 DIAGNOSIS — M5020 Other cervical disc displacement, unspecified cervical region: Principal | ICD-10-CM | POA: Diagnosis present

## 2012-07-20 DIAGNOSIS — Z888 Allergy status to other drugs, medicaments and biological substances status: Secondary | ICD-10-CM

## 2012-07-20 HISTORY — PX: ANTERIOR CERVICAL DECOMP/DISCECTOMY FUSION: SHX1161

## 2012-07-20 SURGERY — ANTERIOR CERVICAL DECOMPRESSION/DISCECTOMY FUSION 2 LEVELS
Anesthesia: General | Site: Spine Cervical | Wound class: Clean

## 2012-07-20 MED ORDER — GLYCOPYRROLATE 0.2 MG/ML IJ SOLN
INTRAMUSCULAR | Status: DC | PRN
  Administered 2012-07-20: 0.6 mg via INTRAVENOUS

## 2012-07-20 MED ORDER — FLEET ENEMA 7-19 GM/118ML RE ENEM: 1.0000 | ENEMA | Freq: Once | RECTAL | Status: AC | PRN

## 2012-07-20 MED ORDER — 0.9 % SODIUM CHLORIDE (POUR BTL) OPTIME
TOPICAL | Status: DC | PRN
  Administered 2012-07-20: 1000 mL

## 2012-07-20 MED ORDER — METHOCARBAMOL 500 MG PO TABS: 500.0000 mg | ORAL_TABLET | Freq: Four times a day (QID) | ORAL | Status: DC | PRN

## 2012-07-20 MED ORDER — METHOCARBAMOL 100 MG/ML IJ SOLN
500.0000 mg | Freq: Four times a day (QID) | INTRAMUSCULAR | Status: DC | PRN
  Filled 2012-07-20: qty 5

## 2012-07-20 MED ORDER — BUPIVACAINE-EPINEPHRINE 0.5% -1:200000 IJ SOLN
INTRAMUSCULAR | Status: DC | PRN
  Administered 2012-07-20: 7 mL

## 2012-07-20 MED ORDER — KCL IN DEXTROSE-NACL 20-5-0.45 MEQ/L-%-% IV SOLN
INTRAVENOUS | Status: DC
  Administered 2012-07-20: 17:00:00 via INTRAVENOUS
  Filled 2012-07-20 (×3): qty 1000

## 2012-07-20 MED ORDER — HYDROCODONE-ACETAMINOPHEN 5-325 MG PO TABS: 1.0000 | ORAL_TABLET | ORAL | Status: DC | PRN

## 2012-07-20 MED ORDER — THROMBIN 20000 UNITS EX KIT
PACK | CUTANEOUS | Status: DC | PRN
  Administered 2012-07-20: 20000 [IU] via TOPICAL

## 2012-07-20 MED ORDER — LISINOPRIL 20 MG PO TABS
20.0000 mg | ORAL_TABLET | Freq: Every day | ORAL | Status: DC
  Filled 2012-07-20: qty 1

## 2012-07-20 MED ORDER — MORPHINE SULFATE 2 MG/ML IJ SOLN
1.0000 mg | INTRAMUSCULAR | Status: DC | PRN
  Administered 2012-07-20 – 2012-07-21 (×5): 2 mg via INTRAVENOUS
  Filled 2012-07-20 (×4): qty 1

## 2012-07-20 MED ORDER — ONDANSETRON HCL 4 MG/2ML IJ SOLN: 4.0000 mg | Freq: Once | INTRAMUSCULAR | Status: DC | PRN

## 2012-07-20 MED ORDER — PHENYLEPHRINE HCL 10 MG/ML IJ SOLN
10.0000 mg | INTRAMUSCULAR | Status: DC | PRN
  Administered 2012-07-20: 10 ug/min via INTRAVENOUS

## 2012-07-20 MED ORDER — HEMOSTATIC AGENTS (NO CHARGE) OPTIME
TOPICAL | Status: DC | PRN
  Administered 2012-07-20: 1 via TOPICAL

## 2012-07-20 MED ORDER — OXYCODONE-ACETAMINOPHEN 5-325 MG PO TABS: 1.0000 | ORAL_TABLET | ORAL | Status: DC | PRN

## 2012-07-20 MED ORDER — ACETAMINOPHEN 10 MG/ML IV SOLN
INTRAVENOUS | Status: AC
  Filled 2012-07-20: qty 100

## 2012-07-20 MED ORDER — VANCOMYCIN HCL 10 G IV SOLR
1500.0000 mg | INTRAVENOUS | Status: DC
  Administered 2012-07-20: 1500 mg via INTRAVENOUS
  Filled 2012-07-20 (×2): qty 1500

## 2012-07-20 MED ORDER — MIDAZOLAM HCL 5 MG/5ML IJ SOLN
INTRAMUSCULAR | Status: DC | PRN
  Administered 2012-07-20: 2 mg via INTRAVENOUS

## 2012-07-20 MED ORDER — PANTOPRAZOLE SODIUM 40 MG PO TBEC
40.0000 mg | DELAYED_RELEASE_TABLET | Freq: Two times a day (BID) | ORAL | Status: DC
  Administered 2012-07-20 – 2012-07-21 (×2): 40 mg via ORAL
  Filled 2012-07-20 (×2): qty 1

## 2012-07-20 MED ORDER — SODIUM CHLORIDE 0.9 % IJ SOLN: 3.0000 mL | INTRAMUSCULAR | Status: DC | PRN

## 2012-07-20 MED ORDER — ROCURONIUM BROMIDE 100 MG/10ML IV SOLN
INTRAVENOUS | Status: DC | PRN
  Administered 2012-07-20: 20 mg via INTRAVENOUS
  Administered 2012-07-20: 50 mg via INTRAVENOUS
  Administered 2012-07-20: 10 mg via INTRAVENOUS

## 2012-07-20 MED ORDER — ACETAMINOPHEN 650 MG RE SUPP: 650.0000 mg | RECTAL | Status: DC | PRN

## 2012-07-20 MED ORDER — ACETAMINOPHEN 10 MG/ML IV SOLN
1000.0000 mg | Freq: Once | INTRAVENOUS | Status: AC | PRN
  Administered 2012-07-20: 1000 mg via INTRAVENOUS

## 2012-07-20 MED ORDER — SIMVASTATIN 40 MG PO TABS
40.0000 mg | ORAL_TABLET | Freq: Every day | ORAL | Status: DC
  Filled 2012-07-20 (×2): qty 1

## 2012-07-20 MED ORDER — ACETAMINOPHEN 325 MG PO TABS: 650.0000 mg | ORAL_TABLET | ORAL | Status: DC | PRN

## 2012-07-20 MED ORDER — CHLORHEXIDINE GLUCONATE 4 % EX LIQD: 60.0000 mL | Freq: Once | CUTANEOUS | Status: DC

## 2012-07-20 MED ORDER — HYDROMORPHONE HCL PF 1 MG/ML IJ SOLN: 0.2500 mg | INTRAMUSCULAR | Status: DC | PRN

## 2012-07-20 MED ORDER — MENTHOL 3 MG MT LOZG: 1.0000 | LOZENGE | OROMUCOSAL | Status: DC | PRN

## 2012-07-20 MED ORDER — PROPOFOL 10 MG/ML IV BOLUS
INTRAVENOUS | Status: DC | PRN
  Administered 2012-07-20: 180 mg via INTRAVENOUS

## 2012-07-20 MED ORDER — MORPHINE SULFATE 2 MG/ML IJ SOLN
INTRAMUSCULAR | Status: AC
  Filled 2012-07-20: qty 1

## 2012-07-20 MED ORDER — PHENOL 1.4 % MT LIQD: 1.0000 | OROMUCOSAL | Status: DC | PRN

## 2012-07-20 MED ORDER — ONDANSETRON HCL 4 MG/2ML IJ SOLN: 4.0000 mg | INTRAMUSCULAR | Status: DC | PRN

## 2012-07-20 MED ORDER — THROMBIN 20000 UNITS EX SOLR
CUTANEOUS | Status: AC
  Filled 2012-07-20: qty 20000

## 2012-07-20 MED ORDER — ACETAMINOPHEN 10 MG/ML IV SOLN: 1000.0000 mg | Freq: Once | INTRAVENOUS | Status: DC | PRN

## 2012-07-20 MED ORDER — FENTANYL CITRATE 0.05 MG/ML IJ SOLN
INTRAMUSCULAR | Status: DC | PRN
  Administered 2012-07-20 (×2): 100 ug via INTRAVENOUS

## 2012-07-20 MED ORDER — PHENYLEPHRINE HCL 10 MG/ML IJ SOLN
INTRAMUSCULAR | Status: DC | PRN
  Administered 2012-07-20 (×2): 40 ug via INTRAVENOUS

## 2012-07-20 MED ORDER — LACTATED RINGERS IV SOLN
INTRAVENOUS | Status: DC | PRN
  Administered 2012-07-20 (×2): via INTRAVENOUS

## 2012-07-20 MED ORDER — SODIUM CHLORIDE 0.9 % IJ SOLN
3.0000 mL | Freq: Two times a day (BID) | INTRAMUSCULAR | Status: DC
  Administered 2012-07-20: 3 mL via INTRAVENOUS

## 2012-07-20 MED ORDER — SODIUM CHLORIDE 0.9 % IV SOLN: 250.0000 mL | INTRAVENOUS | Status: DC

## 2012-07-20 MED ORDER — DOCUSATE SODIUM 100 MG PO CAPS
100.0000 mg | ORAL_CAPSULE | Freq: Two times a day (BID) | ORAL | Status: DC
  Administered 2012-07-20 – 2012-07-21 (×2): 100 mg via ORAL
  Filled 2012-07-20 (×3): qty 1

## 2012-07-20 MED ORDER — ZOLPIDEM TARTRATE 5 MG PO TABS: 5.0000 mg | ORAL_TABLET | Freq: Every evening | ORAL | Status: DC | PRN

## 2012-07-20 MED ORDER — ASPIRIN EC 81 MG PO TBEC
81.0000 mg | DELAYED_RELEASE_TABLET | Freq: Every day | ORAL | Status: DC
  Administered 2012-07-21: 81 mg via ORAL
  Filled 2012-07-20 (×2): qty 1

## 2012-07-20 MED ORDER — NEOSTIGMINE METHYLSULFATE 1 MG/ML IJ SOLN
INTRAMUSCULAR | Status: DC | PRN
  Administered 2012-07-20: 4 mg via INTRAVENOUS

## 2012-07-20 MED ORDER — LIDOCAINE HCL (CARDIAC) 20 MG/ML IV SOLN
INTRAVENOUS | Status: DC | PRN
  Administered 2012-07-20: 60 mg via INTRAVENOUS

## 2012-07-20 MED ORDER — ONDANSETRON HCL 4 MG/2ML IJ SOLN
INTRAMUSCULAR | Status: DC | PRN
  Administered 2012-07-20: 4 mg via INTRAVENOUS

## 2012-07-20 MED ORDER — SENNOSIDES-DOCUSATE SODIUM 8.6-50 MG PO TABS: 1.0000 | ORAL_TABLET | Freq: Every evening | ORAL | Status: DC | PRN

## 2012-07-20 MED ORDER — HYDROMORPHONE HCL PF 1 MG/ML IJ SOLN
INTRAMUSCULAR | Status: AC
  Administered 2012-07-20: 0.5 mg via INTRAVENOUS
  Filled 2012-07-20: qty 1

## 2012-07-20 MED ORDER — LISINOPRIL-HYDROCHLOROTHIAZIDE 20-12.5 MG PO TABS: 1.0000 | ORAL_TABLET | Freq: Every day | ORAL | Status: DC

## 2012-07-20 MED ORDER — BISACODYL 10 MG RE SUPP: 10.0000 mg | Freq: Every day | RECTAL | Status: DC | PRN

## 2012-07-20 MED ORDER — HYDROCHLOROTHIAZIDE 12.5 MG PO CAPS
12.5000 mg | ORAL_CAPSULE | Freq: Every day | ORAL | Status: DC
  Administered 2012-07-21: 12.5 mg via ORAL
  Filled 2012-07-20 (×2): qty 1

## 2012-07-20 MED ORDER — VANCOMYCIN HCL 1000 MG IV SOLR
1000.0000 mg | INTRAVENOUS | Status: DC | PRN
  Administered 2012-07-20: 1000 mg via INTRAVENOUS

## 2012-07-20 SURGICAL SUPPLY — 68 items
Anterior cervical plate, 2 level, 32 mm ×2 IMPLANT
BIT DRILL 14MM (INSTRUMENTS) ×1 IMPLANT
BLADE AVERAGE 25X9 (BLADE) IMPLANT
BLADE SURG ROTATE 9660 (MISCELLANEOUS) IMPLANT
BONE CERVICAL 7MM (Bone Implant) ×2 IMPLANT
BONE CERVICAL 8MM CPS (Bone Implant) ×2 IMPLANT
BUR ROUND FLUTED 4 SOFT TCH (BURR) IMPLANT
BUR SABER RD CUTTING 3.0 (BURR) ×2 IMPLANT
CANISTER SUCTION 2500CC (MISCELLANEOUS) ×2 IMPLANT
CLOTH BEACON ORANGE TIMEOUT ST (SAFETY) ×2 IMPLANT
CORDS BIPOLAR (ELECTRODE) ×2 IMPLANT
COVER SURGICAL LIGHT HANDLE (MISCELLANEOUS) ×2 IMPLANT
DERMABOND ADHESIVE PROPEN (GAUZE/BANDAGES/DRESSINGS) ×1
DERMABOND ADVANCED (GAUZE/BANDAGES/DRESSINGS) ×1
DERMABOND ADVANCED .7 DNX12 (GAUZE/BANDAGES/DRESSINGS) ×1 IMPLANT
DERMABOND ADVANCED .7 DNX6 (GAUZE/BANDAGES/DRESSINGS) ×1 IMPLANT
DRAIN TLS ROUND 10FR (DRAIN) ×2 IMPLANT
DRAPE C-ARM 42X72 X-RAY (DRAPES) ×2 IMPLANT
DRAPE MICROSCOPE LEICA (MISCELLANEOUS) ×2 IMPLANT
DRAPE POUCH INSTRU U-SHP 10X18 (DRAPES) ×2 IMPLANT
DRAPE SURG 17X23 STRL (DRAPES) ×6 IMPLANT
DRILL 14MM (INSTRUMENTS) ×2
DRSG MEPILEX BORDER 4X4 (GAUZE/BANDAGES/DRESSINGS) ×2 IMPLANT
DURAPREP 6ML APPLICATOR 50/CS (WOUND CARE) ×2 IMPLANT
ELECT BLADE 4.0 EZ CLEAN MEGAD (MISCELLANEOUS) ×2
ELECT COATED BLADE 2.86 ST (ELECTRODE) ×2 IMPLANT
ELECT REM PT RETURN 9FT ADLT (ELECTROSURGICAL) ×2
ELECTRODE BLDE 4.0 EZ CLN MEGD (MISCELLANEOUS) ×1 IMPLANT
ELECTRODE REM PT RTRN 9FT ADLT (ELECTROSURGICAL) ×1 IMPLANT
GLOVE BIO SURGEON STRL SZ7 (GLOVE) ×4 IMPLANT
GLOVE BIO SURGEON STRL SZ7.5 (GLOVE) ×2 IMPLANT
GLOVE BIOGEL PI IND STRL 7.5 (GLOVE) ×2 IMPLANT
GLOVE BIOGEL PI INDICATOR 7.5 (GLOVE) ×2
GLOVE ECLIPSE 7.0 STRL STRAW (GLOVE) ×2 IMPLANT
GLOVE ECLIPSE 8.5 STRL (GLOVE) ×2 IMPLANT
GLOVE SURG 8.5 LATEX PF (GLOVE) ×2 IMPLANT
GOWN PREVENTION PLUS LG XLONG (DISPOSABLE) ×4 IMPLANT
GOWN PREVENTION PLUS XXLARGE (GOWN DISPOSABLE) ×2 IMPLANT
GOWN STRL NON-REIN LRG LVL3 (GOWN DISPOSABLE) IMPLANT
HEAD HALTER (SOFTGOODS) ×2 IMPLANT
KIT BASIN OR (CUSTOM PROCEDURE TRAY) ×2 IMPLANT
KIT ROOM TURNOVER OR (KITS) ×2 IMPLANT
MANIFOLD NEPTUNE II (INSTRUMENTS) IMPLANT
NEEDLE SPNL 20GX3.5 QUINCKE YW (NEEDLE) ×4 IMPLANT
NS IRRIG 1000ML POUR BTL (IV SOLUTION) ×2 IMPLANT
PACK ORTHO CERVICAL (CUSTOM PROCEDURE TRAY) ×2 IMPLANT
PAD ARMBOARD 7.5X6 YLW CONV (MISCELLANEOUS) ×6 IMPLANT
PAD CAST 4YDX4 CTTN HI CHSV (CAST SUPPLIES) ×1 IMPLANT
PADDING CAST COTTON 4X4 STRL (CAST SUPPLIES) ×1
PATTIES SURGICAL .5 X.5 (GAUZE/BANDAGES/DRESSINGS) IMPLANT
PIN DISTRACTION 14MM (PIN) ×4 IMPLANT
RESTRAINT LIMB HOLDER UNIV (RESTRAINTS) ×2 IMPLANT
SPONGE INTESTINAL PEANUT (DISPOSABLE) IMPLANT
SPONGE LAP 18X18 X RAY DECT (DISPOSABLE) ×2 IMPLANT
SPONGE LAP 4X18 X RAY DECT (DISPOSABLE) IMPLANT
SPONGE SURGIFOAM ABS GEL 100 (HEMOSTASIS) ×2 IMPLANT
SUT ETHILON 4 0 PS 2 18 (SUTURE) ×2 IMPLANT
SUT VIC AB 2-0 CT1 27 (SUTURE) ×1
SUT VIC AB 2-0 CT1 TAPERPNT 27 (SUTURE) ×1 IMPLANT
SUT VIC AB 3-0 FS2 27 (SUTURE) ×2 IMPLANT
SUT VICRYL 4-0 PS2 18IN ABS (SUTURE) ×2 IMPLANT
SYR 30ML LL (SYRINGE) IMPLANT
SYSTEM CHEST DRAIN TLS 7FR (DRAIN) IMPLANT
TOWEL OR 17X24 6PK STRL BLUE (TOWEL DISPOSABLE) ×2 IMPLANT
TOWEL OR 17X26 10 PK STRL BLUE (TOWEL DISPOSABLE) ×2 IMPLANT
TRAY FOLEY CATH 14FR (SET/KITS/TRAYS/PACK) ×2 IMPLANT
WATER STERILE IRR 1000ML POUR (IV SOLUTION) IMPLANT
varible angle, self tapping screw, 0.4 mm x 14 mm (Orthopedic Implant) ×12 IMPLANT

## 2012-07-20 NOTE — Progress Notes (Signed)
ANTIBIOTIC CONSULT NOTE - INITIAL  Pharmacy Consult for Vancomycin Indication: Post op surgical prophylaxis   Allergies  Allergen Reactions  . Lisinopril Swelling    Swelling in mouth and throat  . Penicillins     REACTION: rash  . Tramadol Hcl     REACTION: rash  . Tramadol Rash    Patient Measurements:  Weight: 119kg Adjusted Body Weight:   Vital Signs: Temp: 98.2 F (36.8 C) (03/24 1335) Temp src: Oral (03/24 0624) BP: 145/76 mmHg (03/24 1335) Pulse Rate: 49 (03/24 1335) Intake/Output from previous day:   Intake/Output from this shift: Total I/O In: 1206 [I.V.:1200; Other:6] Out: 400 [Urine:400]  Labs: No results found for this basename: WBC, HGB, PLT, LABCREA, CREATININE,  in the last 72 hours The CrCl is unknown because both a height and weight (above a minimum accepted value) are required for this calculation. No results found for this basename: VANCOTROUGH, Leodis Binet, VANCORANDOM, GENTTROUGH, GENTPEAK, GENTRANDOM, TOBRATROUGH, TOBRAPEAK, TOBRARND, AMIKACINPEAK, AMIKACINTROU, AMIKACIN,  in the last 72 hours   Microbiology: Recent Results (from the past 720 hour(s))  SURGICAL PCR SCREEN     Status: None   Collection Time    07/10/12 10:40 AM      Result Value Range Status   MRSA, PCR NEGATIVE  NEGATIVE Final   Staphylococcus aureus NEGATIVE  NEGATIVE Final   Comment:            The Xpert SA Assay (FDA     approved for NASAL specimens     in patients over 33 years of age),     is one component of     a comprehensive surveillance     program.  Test performance has     been validated by [employer] for patients greater     than or equal to 32 year old.     It is not intended     to diagnose infection nor to     guide or monitor treatment.    Medical History: Past Medical History  Diagnosis Date  . Hypertension   . Hyperlipidemia   . GERD (gastroesophageal reflux disease)   . Arthritis     Medications:  Prescriptions prior to admission   Medication Sig Dispense Refill  . aspirin EC 81 MG tablet Take 81 mg by mouth daily.      [name] HYDROcodone-acetaminophen (NORCO) 7.5-325 MG per tablet Take 0.5 tablets by mouth daily as needed for pain.      [name] lisinopril-hydrochlorothiazide (PRINZIDE,ZESTORETIC) 20-12.5 MG per tablet Take 1 tablet by mouth daily.      . Multiple Vitamins-Minerals (MULTIVITAMINS THER. W/MINERALS) TABS Take 1 tablet by mouth daily.      . pantoprazole (PROTONIX) 40 MG tablet Take 40 mg by mouth 2 (two) times daily.      . simvastatin (ZOCOR) 40 MG tablet Take 40 mg by mouth every evening.      . [DISCONTINUED] diclofenac (VOLTAREN) 75 MG EC tablet Take 75 mg by mouth 2 (two) times daily as needed (neck pain).       Assessment: 67yof s/p spinal surgery to receive Vancomycin for post-op surgical prophylaxis. Per Op Note, drain was placed during procedure - will continue Vancomycin until physician discontinues per protocol. Patient received Vancomycin 1g preop ~0715. - Weight: 119kg - CrCl ~61 (normalized ~53 ml/min) - PCN allergy  Goal of Therapy:  Vancomycin trough level 10-15 mcg/ml  Plan:  1. Vancomycin 1.5g IV q24h - first dose tonight 2.  Follow-up renal function, clinical status and drain removal  Cleon Dew [phone] 07/20/2012,2:10 PM

## 2012-07-20 NOTE — Progress Notes (Signed)
Orthopedic Tech Progress Note Patient Details:  [name] Jun 17, 1945 [id number] Spoke with patient in [facility]. Patient was issued Biochemist, clinical as well as [employer].  Patient ID: [name], female   DOB: 04-14-1946, 67 y.o.   MRN: [id number]   Orie Rout 07/20/2012, 1:32 PM

## 2012-07-20 NOTE — Brief Op Note (Addendum)
07/20/2012  10:23 AM  PATIENT:  Evlyn Kanner  67 y.o. 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 Noreene Larsson EBL:  Total I/O In: 1200 [I.V.:1200] Out: 400 [Urine:400]  BLOOD ADMINISTERED:none  DRAINS: (10 FR) TLS Drain(s) to suction in the anterior cervical spine   LOCAL MEDICATIONS USED:  MARCAINE 1/4% with 1/200,000 epi. and Amount: 10 ml  SPECIMEN:  No Specimen  DISPOSITION OF SPECIMEN:  N/A  COUNTS:  YES  TOURNIQUET:  * No tourniquets in log *  DICTATION: .Dragon Dictation  PLAN OF CARE: Admit to inpatient   PATIENT DISPOSITION:  PACU - hemodynamically stable.   Delay start of Pharmacological VTE agent (>24hrs) due to surgical blood loss or risk of bleeding: yes

## 2012-07-20 NOTE — Anesthesia Postprocedure Evaluation (Signed)
  Anesthesia Post-op Note  Patient: [name]  Procedure(s) Performed: Procedure(s): ANTERIOR CERVICAL DISCECTOMY FUSION C5-6, C6-7 with transgraft(Alphatec), local bone graft, plate and screws  (N/A)  Patient Location: PACU  Anesthesia Type:General  Level of Consciousness: awake, alert  and oriented  Airway and Oxygen Therapy: Patient Spontanous Breathing and Patient connected to nasal cannula oxygen  Post-op Pain: mild  Post-op Assessment: Post-op Vital signs reviewed, Patient's Cardiovascular Status Stable, Respiratory Function Stable, Patent Airway and Pain level controlled  Post-op Vital Signs: stable  Complications: No apparent anesthesia complications

## 2012-07-20 NOTE — Anesthesia Preprocedure Evaluation (Addendum)
Anesthesia Evaluation  Patient identified by MRN, date of birth, ID band Patient awake    Reviewed: Allergy & Precautions, H&P , NPO status , Patient's Chart, lab work & pertinent test results, reviewed documented beta blocker date and time   Airway Mallampati: II TM Distance: >3 FB Neck ROM: Full    Dental  (+) Edentulous Upper, Edentulous Lower and Dental Advisory Given   Pulmonary neg pulmonary ROS,  breath sounds clear to auscultation        Cardiovascular hypertension, Pt. on medications Rhythm:Regular Rate:Normal     Neuro/Psych  Neuromuscular disease negative neurological ROS     GI/Hepatic GERD-  Medicated,  Endo/Other    Renal/GU      Musculoskeletal   Abdominal (+)  Abdomen: soft. Bowel sounds: normal.  Peds  Hematology   Anesthesia Other Findings   Reproductive/Obstetrics                         Anesthesia Physical Anesthesia Plan  ASA: III  Anesthesia Plan: General   Post-op Pain Management:    Induction:   Airway Management Planned: Oral ETT  Additional Equipment:   Intra-op Plan:   Post-operative Plan:   Informed Consent: I have reviewed the patients History and Physical, chart, labs and discussed the procedure including the risks, benefits and alternatives for the proposed anesthesia with the patient or authorized representative who has indicated his/her understanding and acceptance.     Plan Discussed with:   Anesthesia Plan Comments: (Htn  Morbid Obesity GERD Cervical DDD  Plan GA with oral ETT  Kipp Brood)        Anesthesia Quick Evaluation

## 2012-07-20 NOTE — Plan of Care (Signed)
Problem: Consults Goal: Diagnosis - Spinal Surgery Cervical Spine Fusion     

## 2012-07-20 NOTE — Transfer of Care (Signed)
Immediate Anesthesia Transfer of Care Note  Patient: [name]  Procedure(s) Performed: Procedure(s): ANTERIOR CERVICAL DISCECTOMY FUSION C5-6, C6-7 with transgraft(Alphatec), local bone graft, plate and screws  (N/A)  Patient Location: PACU  Anesthesia Type:General  Level of Consciousness: awake, alert  and oriented  Airway & Oxygen Therapy: Patient connected to face mask  Post-op Assessment: Report given to PACU RN  Post vital signs: stable  Complications: No apparent anesthesia complications

## 2012-07-20 NOTE — Op Note (Addendum)
07/20/2012  10:26 AM  PATIENT:  [name]  67 y.o. 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 Dr. Noreene Larsson.    COMPLICATIONS:  None.     COMPONENTS:Transgraft(Alphatec) allograft fibula, local bone graft, Alphatec 32mm plate and 6x 14mm screws.    PROCEDURE:The patient was met in the holding area, and the appropriate Left cervical C5-6 and C6-7 level identified and marked with an "X" and my initials. The patient was then transported to OR and was placed on the operative table in a supine position. The patient was then placed under general anesthesia without difficulty. The patient received appropriate preoperative antibiotic prophylaxis. Nursing staff inserted a Foley catheter under sterile conditions Cervical spine was positioned with a [place]field horseshoe and 5 pound cervical halter traction. All pressure points well padded and semi-beach chair position. Arm holder both arms. Standard prep with DuraPrep solution the anterior cervical spine chest. Draped in the usual manner. Iodine vi drape was used. Standard timeout protocol was carried out identifying the patient procedure side of the procedure and level. The skin the left neck was infiltrated with Marcaine with epinephrine total of10 cc. This at the level of expected C6 incision and also along the  Lines of Langer. Incision transverse at the C6 level and carried  down to the level of the platysma and then medial to sternocleidomastoid muscle. The interval between the trachea and esophagus medially and the carotid sheath laterally was developed as a Metzenbaum scissors and blunt dissection exposing the anterior aspect of cervical spine at the C5-6 level. The omohyoid muscle idendified and freed with a metzenbaum Scissors then divided allowing the exposure of the anterior prevertebral space. Attention then turned to the C5-6 and C6-7 levels where an 18-gauge spinal needles were placed with sheath intact allowing only a centimeter to extend into the C5-6 and C6-7 to and observed on lateral Carm at the C6-7 and anterior aspect of C6 level. Handheld Cloward retraction of the soft tissues while identifying the level at C5-6 and C6-7 removing a portion of the anterior aspect of the discs with15 blade scalpel and pituitary. Medial border of the longus collie muscles was carefully elevated bilaterally and self-retaining retractors were introduced the foot of the blade beneath the medial border of the longus colli muscles. Soft tissue overlying the anterior borders of the disc space at C5-6 and C6-7 level carefully debridement of soft tissue back to bony edges. The anterior lip osteophytes were then resected using rongeur, Behr rongeur. This bone was preserved for later bone grafting purposes. Distraction pins placed first at the C6-7 level.The C6-7 disc space was then first prepared using loupe magnification and headlight with resection of degenerative disc annulus anteriorly and posteriorly and cartilaginous endplates using micro-curettes pituitaries and a high-speed bur. The operating room microscope was draped sterilely and brought into the field the C-arm previously had been draped sterilely prior to its use. Under the operating room  microscope and posterior aspect of the disc was excised using micro-curettes pituitary rongeur and times per. Posterior lip osteophytes were  drilled using a high-speed bur and a carefully resected with 1 and 2 mm Kerrison foraminotomy performed over both C7 nerve roots using 1 and 2 mm Kerrisons disc herniation material was noted centrally and into both foramen some of which represented reflected portion of the patient's annulus into the neuroforamen. Following decompression of the spinal cord at both C7nerve roots irrigation was carried out. The endplates of the inferior aspect of C6 and superior aspect of C7 were carefully prepared using a high-speed bur to parallel. The disc space was then sounded utilized and a precontoured lordotic sounder for the tranzgraft.  Sounding was carried up to a 8 mm implant.  8 mm spacer was felt to provide best fit for the C6-7 disc space. The 8 mm tranzgraft implant was then brought onto the field. It was then packed with local bone graft that had been harvested previously. The implant was then carefully placed over the intervertebral disc space at C6-7 level. Care taken to ensure that no bone or soft tissue debris within the disc space that could be retropulsed with insertion of  bone graft. Thegraft   was then impacted into place with the head placed in longitudinal cervical traction.  The graft was felt to be in excellent position alignment. Distraction pin at the C7 removed. Attention then turned to the C5-6. The Boss McCollough retractor replaced at the C5-6 level. The foot of the blades medial to the longus colli muscles. A distraction screw post placed at the C5 level and distraction of the disc space performed.The C5-6 disc space was then first prepared [place] with resection of degenerative disc annulus anteriorly and posteriorly and cartilaginous endplates using micro-curettes pituitaries and a high-speed bur. The operating room microscope  was  used. Under the operating room microscope and posterior aspect of the disc was excised using micro-curettes pituitary rongeur and times per.The operating room microscope  was sterilely draped and brought into the field. Posterior lip osteophytes were drilled using a high-speed bur and a carefully resected with 1 and 2 mm Kerrison foraminotomy performed over both C6 nerve roots using 1 and 2 mm Kerrisons disc herniation material was noted centrally and into the right foramen some of which represented reflected portion of the patient's annulus into the neuroforamen. Additionally there was significant spur into the right side C6 neuroforamen affecting the right C6 nerve. Following decompression of the spinal cord at both C6 nerve roots, irrigation was carried out. Bleeding controlled using some bone wax excess bone wax removed and Gelfoam thrombin-soaked. The endplates of the inferior aspect of C5 and superior aspect of C6 were carefully prepared using a high-speed bur to parallel. The disc space was then sounded utilized and a precontoured lordotic sounder for the tranzgraft.  Sounding was carried up to a 7 mm implant.  7 mm spacer was felt to provide best fit for the C5-6 disc space. The 7 mm tranzgraft implant was then brought onto the field. It was then packed with local bone graft that had been harvested previously. The implant was then carefully placed over the intervertebral disc space at C5-6 level. Care taken to ensure that no bone or soft tissue debris within the disc space that could be retropulsed with insertion of the bone graft. The graft was then impacted into place with the head placed in longitudinal cervical traction.  The graft was felt to be  in excellent position alignment. Distraction pins at the C5 and C6 removed. This point irrigation was carried out cervical incision site and lower cervical incision site. Esophagus examined and the upper cervical level as well as at the lower cervical level and found to be normal. Irrigation was again carried out there was no active bleeding present.Longitudinal halter traction was discontinued. The length for cervical plate  determined with cottonoid string and two bayonet forceps. Anterior lip osteophytes were then resected about the C5-6 and C6-7 levels using a high-speed bur. This area smoothed for application of the plate to the anterior surface of the cervical spine from C5-C7 A 32 mm alphatec plate chosen. The plate carefully applied to the anterior cervical spine and aligned. A single retaining screw placed at the left C6 level. Drilling 14 mm hole left side at the C5 level and a 14 mm screw placed. The retaining pin was then removed and a drill hole made on the right side at C6 to 14 mm and a 14 mm screw placed. A second screw then placed on the right side at the C5 level and on the left at the C6 level. Then both sides it to see 7 level a drill to 14 mm and 14 mm screws placed. Total of 6-14 mm screws were placed each locked within the plate quite nicely. Intraoperative lateral radiograph of the cervical spine demonstrate plates and screws and bone grafts to be in good position alignment without any sign of canal impingement or retropulsion of graft. Irrigation was carried out at cervical incision site and lower cervical incision site. Esophagus examined and the upper cervical level as well as at the lower cervical level and found to be normal.A 10 french TLS drain placed exiting over the medial anterior neck just medial to the skin incision. This was sutured in place with 4-0 nylon stitch.   The incisions were then closed by approximating the omohyoid muscle with a 2-0 vicryl sutures ,then deep subcutaneous layers the platysma layer with interrupted 2-0 Vicryl suture and the superficial fascia overlying the sternocleidomastoid muscle with interrupted 3-0 Vicryl sutures. The subcutaneous layers were approximated with interrupted 3-0 Vicryl sutures as were the superficial layers. The skin was closed with a running subcutaneous stitch of 4-0 Vicryl at  the operative C5-7 transverse incision. Skin was approximated with Dermabond.  Mepilex bandage was applied. A [place] collar was then applied to the cervical spine. drapes were removed. Patient's or table was then returned to the flat position. At the end of the cervical spine surgery case all instrument and sponge counts were correct. Patient was then reactivated extubated and returned to recovery room in satisfactory condition.  Maud Deed PA-C perform the duties of assistant surgeon performing careful retraction of the esophagus and trachea during the initial exposure and careful suctioning of neural elements cervical nerve roots and cervical cord. He was present from the beginning of case to the end of the case and assisted in positioning of the patient in transfer the patient from bed to the stretcher at the end of the case. She closed the incision on the platysma layer to the skin. Applied dressing.   Sherrel Ploch E 07/20/2012, 10:26 AM

## 2012-07-20 NOTE — Anesthesia Procedure Notes (Signed)
Procedure Name: Intubation Date/Time: 07/20/2012 7:48 AM Performed by: Ellin Goodie Pre-anesthesia Checklist: Patient identified, Emergency Drugs available, Suction available, Patient being monitored and Timeout performed Patient Re-evaluated:Patient Re-evaluated prior to inductionOxygen Delivery Method: Circle system utilized Preoxygenation: Pre-oxygenation with 100% oxygen Intubation Type: IV induction Ventilation: Mask ventilation without difficulty Laryngoscope Size: Mac and 3 Grade View: Grade I Tube type: Oral Tube size: 7.5 mm Number of attempts: 1 Airway Equipment and Method: Stylet Placement Confirmation: ETT inserted through vocal cords under direct vision,  positive ETCO2 and breath sounds checked- equal and bilateral Secured at: 22 cm Tube secured with: Tape Dental Injury: Teeth and Oropharynx as per pre-operative assessment

## 2012-07-20 NOTE — Progress Notes (Signed)
Orthopedic Tech Progress Note Patient Details:  [name] 1945-09-17 [id number] Philly collar ordered for patient in OR. Collar delivered to OR front desk. Ortho Devices Type of Ortho Device: Philadelphia cervical collar Ortho Device/Splint Interventions: Ordered   [place] R Thompson 07/20/2012, 10:21 AM

## 2012-07-20 NOTE — Preoperative (Signed)
Beta Blockers   Reason not to administer Beta Blockers:Not Applicable 

## 2012-07-21 ENCOUNTER — Encounter (HOSPITAL_COMMUNITY): Payer: Self-pay | Admitting: Specialist

## 2012-07-21 MED ORDER — OXYCODONE-ACETAMINOPHEN 5-325 MG PO TABS: 1.0000 | ORAL_TABLET | Freq: Four times a day (QID) | ORAL | Status: DC | PRN

## 2012-07-21 MED ORDER — METHOCARBAMOL 500 MG PO TABS: 500.0000 mg | ORAL_TABLET | Freq: Four times a day (QID) | ORAL | Status: DC | PRN

## 2012-07-21 NOTE — Discharge Summary (Signed)
Physician Discharge Summary  Patient ID: [name] MRN: [id number] DOB/AGE: [birth year] 67 y.o.  Admit date: 07/20/2012 Discharge date: 07/21/2012  Admission Diagnoses:  Principal Problem:   HNP (herniated nucleus pulposus), cervical Active Problems:   Cervical spondylosis without myelopathy   Discharge Diagnoses:  Same  Past Medical History  Diagnosis Date  . Hypertension   . Hyperlipidemia   . GERD (gastroesophageal reflux disease)   . Arthritis     Surgeries: Procedure(s): ANTERIOR CERVICAL DISCECTOMY FUSION C5-6, C6-7 with transgraft(Alphatec), local bone graft, plate and screws  on 07/20/2012   Consultants:    Discharged Condition: Improved  Hospital Course: [name] is an 67 y.o. female who was admitted 07/20/2012 with a chief complaint of No chief complaint on file. , and found to have a diagnosis of HNP (herniated nucleus pulposus), cervical.  They were brought to the operating room on 07/20/2012 and underwent the above named procedures.    They were given perioperative antibiotics:  Anti-infectives   Start     Dose/Rate Route Frequency Ordered Stop   07/20/12 1800  vancomycin (VANCOCIN) 1,500 mg in sodium chloride 0.9 % 500 mL IVPB     1,500 mg 250 mL/hr over 120 Minutes Intravenous Every 24 hours 07/20/12 1420     07/19/12 0830  vancomycin (VANCOCIN) IVPB 1000 mg/200 mL premix  Status:  Discontinued     1,000 mg 200 mL/hr over 60 Minutes Intravenous On call to O.R. 07/19/12 0830 07/20/12 1337    .  They were given sequential compression devices, early ambulation, and chemoprophylaxis for DVT prophylaxis. On POD#1 she demonstrated ability to get out of bed to commode and walk short distances. Foley was removed and She voided on her own without difficulty. O2 was discontinued uneventfully. The drain came out when she transferred to The commode in the AM POD#1 and the suture was removed. There was no swelling or sign of infection or hematoma. She  complained of sore throat. Pharmacy recommended that she be given HCTZ as she described an allergy to lisinopril. She is to discuss this with her primary care physician post discharge. She was discharged home POD#! Dressing changed And normal neurologic exam. Aspen collar in good condition. They benefited maximally from their hospital stay and there were no complications.    Recent vital signs:  Filed Vitals:   07/21/12 0641  BP: 146/90  Pulse: 52  Temp: 97.9 F (36.6 C)  Resp: 20    Recent laboratory studies:  Results for orders placed during the hospital encounter of 07/10/12  SURGICAL PCR SCREEN      Result Value Range   MRSA, PCR NEGATIVE  NEGATIVE   Staphylococcus aureus NEGATIVE  NEGATIVE  BASIC METABOLIC PANEL      Result Value Range   Sodium 139  135 - 145 mEq/L   Potassium 4.3  3.5 - 5.1 mEq/L   Chloride 101  96 - 112 mEq/L   CO2 30  19 - 32 mEq/L   Glucose, Bld 93  70 - 99 mg/dL   BUN 15  6 - 23 mg/dL   Creatinine, Ser 1.47 (*) 0.50 - 1.10 mg/dL   Calcium 9.5  8.4 - 82.9 mg/dL   GFR calc non Af Amer 47 (*) >90 mL/min   GFR calc Af Amer 54 (*) >90 mL/min  CBC WITH DIFFERENTIAL      Result Value Range   WBC 5.0  4.0 - 10.5 K/uL   RBC 4.84  3.87 - 5.11 MIL/uL  Hemoglobin 12.9  12.0 - 15.0 g/dL   HCT 16.1  09.6 - 04.5 %   MCV 80.2  78.0 - 100.0 fL   MCH 26.7  26.0 - 34.0 pg   MCHC 33.2  30.0 - 36.0 g/dL   RDW 40.9  81.1 - 91.4 %   Platelets 197  150 - 400 K/uL   Neutrophils Relative 39 (*) 43 - 77 %   Neutro Abs 2.0  1.7 - 7.7 K/uL   Lymphocytes Relative 43  12 - 46 %   Lymphs Abs 2.1  0.7 - 4.0 K/uL   Monocytes Relative 8  3 - 12 %   Monocytes Absolute 0.4  0.1 - 1.0 K/uL   Eosinophils Relative 10 (*) 0 - 5 %   Eosinophils Absolute 0.5  0.0 - 0.7 K/uL   Basophils Relative 0  0 - 1 %   Basophils Absolute 0.0  0.0 - 0.1 K/uL  URINALYSIS, ROUTINE W REFLEX MICROSCOPIC      Result Value Range   Color, Urine YELLOW  YELLOW   APPearance CLEAR  CLEAR    Specific Gravity, Urine 1.004 (*) 1.005 - 1.030   pH 7.0  5.0 - 8.0   Glucose, UA NEGATIVE  NEGATIVE mg/dL   Hgb urine dipstick NEGATIVE  NEGATIVE   Bilirubin Urine NEGATIVE  NEGATIVE   Ketones, ur NEGATIVE  NEGATIVE mg/dL   Protein, ur NEGATIVE  NEGATIVE mg/dL   Urobilinogen, UA 0.2  0.0 - 1.0 mg/dL   Nitrite NEGATIVE  NEGATIVE   Leukocytes, UA NEGATIVE  NEGATIVE    Discharge Medications:     Medication List    STOP taking these medications       diclofenac 75 MG EC tablet  Commonly known as:  VOLTAREN      TAKE these medications       aspirin EC 81 MG tablet  Take 81 mg by mouth daily.     HYDROcodone-acetaminophen 7.5-325 MG per tablet  Commonly known as:  NORCO  Take 0.5 tablets by mouth daily as needed for pain.     lisinopril-hydrochlorothiazide 20-12.5 MG per tablet  Commonly known as:  PRINZIDE,ZESTORETIC  Take 0.5 tablets by mouth daily as needed (Takes with certain foods (salty, etc) for BP control despite serious allergy).     methocarbamol 500 MG tablet  Commonly known as:  ROBAXIN  Take 1 tablet (500 mg total) by mouth every 6 (six) hours as needed (spasm).     methocarbamol 500 MG tablet  Commonly known as:  ROBAXIN  Take 1 tablet (500 mg total) by mouth every 6 (six) hours as needed.     multivitamins ther. w/minerals Tabs  Take 1 tablet by mouth daily.     oxyCODONE-acetaminophen 5-325 MG per tablet  Commonly known as:  ROXICET  Take 1-2 tablets by mouth every 4 (four) hours as needed for pain.     oxyCODONE-acetaminophen 5-325 MG per tablet  Commonly known as:  PERCOCET/ROXICET  Take 1-2 tablets by mouth every 6 (six) hours as needed for pain.     pantoprazole 40 MG tablet  Commonly known as:  PROTONIX  Take 40 mg by mouth 2 (two) times daily.     simvastatin 40 MG tablet  Commonly known as:  ZOCOR  Take 40 mg by mouth every evening.        Diagnostic Studies: Dg Chest 2 View  07/10/2012  *RADIOLOGY REPORT*  Clinical Data:  Preoperative respiratory exam.  Cervical spondylosis.  CHEST -  2 VIEW  Comparison: Chest x-ray dated 07/16/2010  Findings: The heart size and pulmonary vascularity are normal and the lungs are clear.  No acute osseous abnormality.  IMPRESSION: No acute disease.   Original Report Authenticated By: Francene Boyers, M.D.    Dg Cervical Spine 2-3 Views  07/20/2012  *RADIOLOGY REPORT*  Clinical Data: Mild disc disease.  DG C-ARM 1-60 MIN,CERVICAL SPINE - 2-3 VIEW  Comparison: MRI dated 05/11/2012  Findings: AP and lateral views demonstrate the patient has undergone anterior cervical fusion at C5-6 and C6-7.  Interbody plugs are present.  Anterior plate and screws appear in good position in the AP and lateral projections.  IMPRESSION: Anterior cervical fusion performed at C5-6 and C6-7.   Original Report Authenticated By: Francene Boyers, M.D.    Dg C-arm 1-60 Min  07/20/2012  *RADIOLOGY REPORT*  Clinical Data: Mild disc disease.  DG C-ARM 1-60 MIN,CERVICAL SPINE - 2-3 VIEW  Comparison: MRI dated 05/11/2012  Findings: AP and lateral views demonstrate the patient has undergone anterior cervical fusion at C5-6 and C6-7.  Interbody plugs are present.  Anterior plate and screws appear in good position in the AP and lateral projections.  IMPRESSION: Anterior cervical fusion performed at C5-6 and C6-7.   Original Report Authenticated By: Francene Boyers, M.D.     Disposition: 01-Home or Self Care      Discharge Orders   Future Orders Complete By Expires     Call MD / Call 911  As directed     Comments:      If you experience chest pain or shortness of breath, CALL 911 and be transported to the hospital emergency room.  If you develope a fever above 101 F, pus (white drainage) or increased drainage or redness at the wound, or calf pain, call your surgeon's office.    Constipation Prevention  As directed     Comments:      Drink plenty of fluids.  Prune juice may be helpful.  You may use a stool softener, such as  Colace (over the counter) 100 mg twice a day.  Use MiraLax (over the counter) for constipation as needed.    Diet - low sodium heart healthy  As directed     Scheduling Instructions:      Liquids and soups gradually increasing the texture and thickness to soft diet And then regular as tolerated. Expect sore throat for up to 4 weeks post op.    Discharge instructions  As directed     Comments:      No lifting greater than 10 lbs. No overhead use of arms. Avoid bending,and twisting neck. Walk in house for first week them may start to get out slowly increasing distance up to one mile by 3 weeks post op. Keep incision dry for 3 days, may then bathe and wet incision using a [place] collar when showering. Call if any fevers >101, chills, or increasing numbness or weakness or increased swelling or drainage.    Driving restrictions  As directed     Comments:      No driving for 6 weeks    Increase activity slowly as tolerated  As directed     Lifting restrictions  As directed     Comments:      No lifting for 8 weeks       Follow-up Information   Follow up with NITKA,[name] E, MD. Schedule an appointment as soon as possible for a visit in 2 weeks. (Keep your appt  for 08/03/2012)    Contact information:   [address] [place] [address] [phone]        Signed: Kerrin Champagne 07/21/2012, 9:08 AM

## 2012-07-21 NOTE — Progress Notes (Signed)
Patient discharged home in stable condition via wheelchair. Discharge instructions and prescriptions were given and explained.

## 2012-07-21 NOTE — Progress Notes (Signed)
Patient ID: [name], female   DOB: 10-28-1945, 67 y.o.   MRN: [id number] Subjective: 1 Day Post-Op Procedure(s) (LRB): ANTERIOR CERVICAL DISCECTOMY FUSION C5-6, C6-7 with transgraft(Alphatec), local bone graft, plate and screws  (N/A) Awake, alert and Ox4. On bedside commode this am. Drain came out this AM on its own. Aspen collar intact and in good condition. Clear liquids advancing to soft diet. Intra op xrays good position and alignment of plate and screws and grafts C5-6 and C6-7. Patient reports pain as moderate.    Objective:   VITALS:  Temp:  [97.6 F (36.4 C)-98.6 F (37 C)] 97.9 F (36.6 C) (03/25 0641) Pulse Rate:  [48-71] 52 (03/25 0641) Resp:  [11-20] 20 (03/25 0641) BP: (138-160)/(76-94) 146/90 mmHg (03/25 0641) SpO2:  [99 %-100 %] 100 % (03/25 0641)  Neurologically intact ABD soft Neurovascular intact Sensation intact distally Intact pulses distally Dorsiflexion/Plantar flexion intact Incision: no drainage and suture removed that held drain anterior neck   LABS No results found for this basename: HGB, WBC, PLT,  in the last 72 hours No results found for this basename: NA, K, CL, CO2, BUN, CREATININE, GLUCOSE,  in the last 72 hours No results found for this basename: LABPT, INR,  in the last 72 hours   Assessment/Plan: 1 Day Post-Op Procedure(s) (LRB): ANTERIOR CERVICAL DISCECTOMY FUSION C5-6, C6-7 with transgraft(Alphatec), local bone graft, plate and screws  (N/A)  Advance diet Up with therapy D/C IV fluids Discharge home with home health  NITKA,[name] E 07/21/2012, 8:42 AM

## 2012-11-30 ENCOUNTER — Emergency Department (HOSPITAL_COMMUNITY)
Admission: EM | Admit: 2012-11-30 | Discharge: 2012-11-30 | Disposition: A | Discharge status: Home / Self Care | Payer: Medicare Other | Attending: Emergency Medicine | Admitting: Emergency Medicine

## 2012-11-30 DIAGNOSIS — K219 Gastro-esophageal reflux disease without esophagitis: Secondary | ICD-10-CM | POA: Insufficient documentation

## 2012-11-30 DIAGNOSIS — M129 Arthropathy, unspecified: Secondary | ICD-10-CM | POA: Insufficient documentation

## 2012-11-30 DIAGNOSIS — Z880 Allergy status to penicillin: Secondary | ICD-10-CM | POA: Insufficient documentation

## 2012-11-30 DIAGNOSIS — Z79899 Other long term (current) drug therapy: Secondary | ICD-10-CM | POA: Insufficient documentation

## 2012-11-30 DIAGNOSIS — T148XXA Other injury of unspecified body region, initial encounter: Secondary | ICD-10-CM | POA: Insufficient documentation

## 2012-11-30 DIAGNOSIS — Y939 Activity, unspecified: Secondary | ICD-10-CM | POA: Insufficient documentation

## 2012-11-30 DIAGNOSIS — E785 Hyperlipidemia, unspecified: Secondary | ICD-10-CM | POA: Insufficient documentation

## 2012-11-30 DIAGNOSIS — X58XXXA Exposure to other specified factors, initial encounter: Secondary | ICD-10-CM | POA: Insufficient documentation

## 2012-11-30 DIAGNOSIS — I10 Essential (primary) hypertension: Secondary | ICD-10-CM | POA: Insufficient documentation

## 2012-11-30 DIAGNOSIS — Z7982 Long term (current) use of aspirin: Secondary | ICD-10-CM | POA: Insufficient documentation

## 2012-11-30 DIAGNOSIS — S161XXA Strain of muscle, fascia and tendon at neck level, initial encounter: Secondary | ICD-10-CM

## 2012-11-30 DIAGNOSIS — Z87828 Personal history of other (healed) physical injury and trauma: Secondary | ICD-10-CM | POA: Insufficient documentation

## 2012-11-30 DIAGNOSIS — Y92009 Unspecified place in unspecified non-institutional (private) residence as the place of occurrence of the external cause: Secondary | ICD-10-CM | POA: Insufficient documentation

## 2012-11-30 MED ORDER — HYDROCODONE-ACETAMINOPHEN 5-325 MG PO TABS: 1.0000 | ORAL_TABLET | Freq: Four times a day (QID) | ORAL | Status: DC | PRN

## 2012-11-30 MED ORDER — CYCLOBENZAPRINE HCL 10 MG PO TABS: 10.0000 mg | ORAL_TABLET | Freq: Two times a day (BID) | ORAL | Status: DC | PRN

## 2012-11-30 MED ORDER — CYCLOBENZAPRINE HCL 10 MG PO TABS
5.0000 mg | ORAL_TABLET | Freq: Once | ORAL | Status: AC
  Administered 2012-11-30: 5 mg via ORAL
  Filled 2012-11-30: qty 1

## 2012-11-30 MED ORDER — HYDROCODONE-ACETAMINOPHEN 5-325 MG PO TABS
1.0000 | ORAL_TABLET | Freq: Once | ORAL | Status: AC
  Administered 2012-11-30: 1 via ORAL
  Filled 2012-11-30: qty 1

## 2012-11-30 NOTE — ED Provider Notes (Signed)
CSN: [id number]     Arrival date & time 11/30/12  2111 History  This chart was scribed for Jaynie Crumble, PA-C, working with Lyanne Co, MD, by [facility] ED Scribe. This patient was seen in room TR06C/TR06C and the patient's care was started at 10:48 PM.   First MD Initiated Contact with Patient 11/30/12 2235     Chief Complaint  Patient presents with  . Neck Pain    The history is provided by the patient. No language interpreter was used.   HPI Comments: [name] is a 67 y.o. female who presents to the Emergency Department complaining of sudden onset, gradually worsening right-sided neck pain, which woke her from her sleep at 4:30 AM, about 18 hours ago.  She states that she took Diclofenac without relief today. She states that her neck pain is worsened with palpation and with ROM of her neck. She states that she had a neck surgery on 07/20/12, performed by Dr. Kerrin Champagne, MD. She states that she has not had pain like she is having today before. She denies blurred vision, headache, numbness or weakness in her arms, back pain, SOB, abdominal pain, chest pain, fever, chills, sore throat, vomiting or any other symptoms. She denies any history of smoking and denies alcohol use.   Past Medical History  Diagnosis Date  . Hypertension   . Hyperlipidemia   . GERD (gastroesophageal reflux disease)   . Arthritis    Past Surgical History  Procedure Laterality Date  . Neck surgery    . Abdominal hysterectomy    . Replacement total knee bilateral    . Back surgery    . Thyroid surgery    . Anterior cervical decomp/discectomy fusion N/A 07/20/2012    Procedure: ANTERIOR CERVICAL DISCECTOMY FUSION C5-6, C6-7 with transgraft(Alphatec), local bone graft, plate and screws ;  Surgeon: Kerrin Champagne, MD;  Location: [facility] OR;  Service: Orthopedics;  Laterality: N/A;   No family history on file. History  Substance Use Topics  . Smoking status: Never Smoker   . Smokeless tobacco:  Not on file  . Alcohol Use: No   OB History   Grav Para Term Preterm Abortions TAB SAB Ect Mult Living                 Review of Systems  Constitutional: Negative for fever and chills.  HENT: Positive for neck pain. Negative for sore throat.   Eyes: Negative for visual disturbance.  Respiratory: Negative for shortness of breath.   Cardiovascular: Negative for chest pain.  Gastrointestinal: Negative for nausea, vomiting and abdominal pain.  Musculoskeletal: Negative for back pain.  Neurological: Positive for headaches. Negative for weakness and numbness.  All other systems reviewed and are negative.    Allergies  Lisinopril; Penicillins; and Tramadol hcl  Home Medications   Current Outpatient Rx  Name  Route  Sig  Dispense  Refill  . aspirin EC 81 MG tablet   Oral   Take 81 mg by mouth daily.         . diclofenac (VOLTAREN) 75 MG EC tablet   Oral   Take 75 mg by mouth 2 (two) times daily with a meal.         . ibuprofen (ADVIL,MOTRIN) 200 MG tablet   Oral   Take 200 mg by mouth 2 (two) times daily as needed for pain.         [name] losartan (COZAAR) 100 MG tablet   Oral  Take 100 mg by mouth daily.          . Multiple Vitamins-Minerals (MULTIVITAMINS THER. W/MINERALS) TABS   Oral   Take 1 tablet by mouth daily.         . pantoprazole (PROTONIX) 40 MG tablet   Oral   Take 40 mg by mouth daily.          . simvastatin (ZOCOR) 40 MG tablet   Oral   Take 40 mg by mouth every evening.          Triage Vitals: BP 163/89  Pulse 76  Temp(Src) 98.1 F (36.7 C) (Oral)  Resp 22  SpO2 98%  Physical Exam  Nursing note and vitals reviewed. Constitutional: She is oriented to person, place, and time. She appears well-developed and well-nourished.  HENT:  Head: Normocephalic and atraumatic.  Eyes: Conjunctivae and EOM are normal. Pupils are equal, round, and reactive to light.  Neck: Normal range of motion. Neck supple. No tracheal deviation present.  No  midline cervical tenderness. No carotid bruits. No masses  Cardiovascular: Normal rate, regular rhythm and normal heart sounds.   Pulmonary/Chest: Effort normal and breath sounds normal. No respiratory distress.  Abdominal: Soft. Bowel sounds are normal. There is no tenderness.  Musculoskeletal:  She is tender over right sternocleidomastoid muscle. No tenderness in trapezius bilaterally. Pain with head deviation and rotation to the right. No pain with head deviation or rotation in any other direction. Full rom of the neck and shoulders bilaterally  Neurological: She is alert and oriented to person, place, and time.  5/5 and equal grip strength bilaterally  Skin: Skin is warm and dry. No rash noted.  Psychiatric: She has a normal mood and affect.    ED Course   Medications  HYDROcodone-acetaminophen (NORCO/VICODIN) 5-325 MG per tablet 1 tablet (not administered)  cyclobenzaprine (FLEXERIL) tablet 5 mg (not administered)   Procedures (including critical care time)  DIAGNOSTIC STUDIES: Oxygen Saturation is 98% on RA, normal by my interpretation.    COORDINATION OF CARE: 11:00 PM- Pt advised of plan to receive Flexeril and Vicodin in the ED, as well as prescriptions upon discharge and pt agrees.   Labs Reviewed - No data to display  No results found.  1. Strain of sternocleidomastoid muscle, initial encounter     MDM  Pt with right sided neck pain, specifically tendernss and pain in right sternocleidomastoid muscle. No swelling, masses, bruits in the neck. No visual changes. No headache. No numbness or weakness in Upper or Lower extremities. Suspect muscular strain. Will try muscle relaxant, norco just enough for 2-3 days, follow up with pcp.   Filed Vitals:   11/30/12 2143  BP: 163/89  Pulse: 76  Temp: 98.1 F (36.7 C)  TempSrc: Oral  Resp: 22  SpO2: 98%      I personally performed the services described in this documentation, which was scribed in my presence. The  recorded information has been reviewed and is accurate.    Lottie Mussel, PA-C 11/30/12 2321

## 2012-11-30 NOTE — ED Notes (Addendum)
Pt coming from home with c/o chronic neck pain. Pt has DDD. Pt woke up around 0430 with sudden onset of pain and has gotten progressively worse. Pt is A&Ox4, respirations equal and unlabored, skin warm and dry. Pt denies any fall or any trauma

## 2012-11-30 NOTE — ED Notes (Signed)
Pt comfortable with d/c and f/u instructions. Prescriptions x2. 

## 2012-12-01 NOTE — ED Provider Notes (Signed)
Medical screening examination/treatment/procedure(s) were performed by non-physician practitioner and as supervising physician I was immediately available for consultation/collaboration.  Myrikal Messmer M Piero Mustard, MD 12/01/12 0013 

## 2013-04-29 ENCOUNTER — Encounter (HOSPITAL_COMMUNITY): Payer: Self-pay | Admitting: Emergency Medicine

## 2013-04-29 ENCOUNTER — Emergency Department (HOSPITAL_COMMUNITY): Payer: Medicare Other

## 2013-04-29 ENCOUNTER — Emergency Department (HOSPITAL_COMMUNITY)
Admission: EM | Admit: 2013-04-29 | Discharge: 2013-04-29 | Disposition: A | Discharge status: Home / Self Care | Payer: Medicare Other | Attending: Emergency Medicine | Admitting: Emergency Medicine

## 2013-04-29 DIAGNOSIS — M129 Arthropathy, unspecified: Secondary | ICD-10-CM | POA: Insufficient documentation

## 2013-04-29 DIAGNOSIS — R42 Dizziness and giddiness: Secondary | ICD-10-CM

## 2013-04-29 DIAGNOSIS — E785 Hyperlipidemia, unspecified: Secondary | ICD-10-CM | POA: Insufficient documentation

## 2013-04-29 DIAGNOSIS — K219 Gastro-esophageal reflux disease without esophagitis: Secondary | ICD-10-CM | POA: Insufficient documentation

## 2013-04-29 DIAGNOSIS — I10 Essential (primary) hypertension: Secondary | ICD-10-CM | POA: Insufficient documentation

## 2013-04-29 DIAGNOSIS — Z880 Allergy status to penicillin: Secondary | ICD-10-CM | POA: Insufficient documentation

## 2013-04-29 DIAGNOSIS — Z79899 Other long term (current) drug therapy: Secondary | ICD-10-CM | POA: Insufficient documentation

## 2013-04-29 DIAGNOSIS — R110 Nausea: Secondary | ICD-10-CM | POA: Insufficient documentation

## 2013-04-29 LAB — CBC WITH DIFFERENTIAL/PLATELET
BASOS ABS: 0 10*3/uL (ref 0.0–0.1)
Basophils Relative: 0 % (ref 0–1)
EOS ABS: 0.1 10*3/uL (ref 0.0–0.7)
Eosinophils Relative: 3 % (ref 0–5)
HCT: 38.6 % (ref 36.0–46.0)
HEMOGLOBIN: 12.8 g/dL (ref 12.0–15.0)
LYMPHS PCT: 51 % — AB (ref 12–46)
Lymphs Abs: 1.9 10*3/uL (ref 0.7–4.0)
MCH: 26.4 pg (ref 26.0–34.0)
MCHC: 33.2 g/dL (ref 30.0–36.0)
MCV: 79.8 fL (ref 78.0–100.0)
MONO ABS: 0.3 10*3/uL (ref 0.1–1.0)
Monocytes Relative: 7 % (ref 3–12)
NEUTROS PCT: 39 % — AB (ref 43–77)
Neutro Abs: 1.5 10*3/uL — ABNORMAL LOW (ref 1.7–7.7)
PLATELETS: 196 10*3/uL (ref 150–400)
RBC: 4.84 MIL/uL (ref 3.87–5.11)
RDW: 15.7 % — AB (ref 11.5–15.5)
WBC: 3.8 10*3/uL — ABNORMAL LOW (ref 4.0–10.5)

## 2013-04-29 LAB — BASIC METABOLIC PANEL
BUN: 17 mg/dL (ref 6–23)
CALCIUM: 9.1 mg/dL (ref 8.4–10.5)
CO2: 26 mEq/L (ref 19–32)
Chloride: 102 mEq/L (ref 96–112)
Creatinine, Ser: 1.04 mg/dL (ref 0.50–1.10)
GFR, EST AFRICAN AMERICAN: 63 mL/min — AB (ref >90)
GFR, EST NON AFRICAN AMERICAN: 55 mL/min — AB (ref >90)
GLUCOSE: 135 mg/dL — AB (ref 70–99)
POTASSIUM: 3.9 meq/L (ref 3.7–5.3)
SODIUM: 141 meq/L (ref 137–147)

## 2013-04-29 LAB — URINALYSIS, ROUTINE W REFLEX MICROSCOPIC
BILIRUBIN URINE: NEGATIVE
Glucose, UA: NEGATIVE mg/dL
HGB URINE DIPSTICK: NEGATIVE
Ketones, ur: NEGATIVE mg/dL
Leukocytes, UA: NEGATIVE
NITRITE: NEGATIVE
PROTEIN: NEGATIVE mg/dL
SPECIFIC GRAVITY, URINE: 1.014 (ref 1.005–1.030)
UROBILINOGEN UA: 0.2 mg/dL (ref 0.0–1.0)
pH: 7 (ref 5.0–8.0)

## 2013-04-29 MED ORDER — MECLIZINE HCL 25 MG PO TABS
25.0000 mg | ORAL_TABLET | Freq: Once | ORAL | Status: AC
  Administered 2013-04-29: 25 mg via ORAL
  Filled 2013-04-29: qty 1

## 2013-04-29 MED ORDER — MECLIZINE HCL 25 MG PO TABS: 25.0000 mg | ORAL_TABLET | Freq: Three times a day (TID) | ORAL | Status: DC | PRN

## 2013-04-29 MED ORDER — ONDANSETRON 4 MG PO TBDP
4.0000 mg | ORAL_TABLET | Freq: Once | ORAL | Status: AC
  Administered 2013-04-29: 4 mg via ORAL
  Filled 2013-04-29: qty 1

## 2013-04-29 NOTE — ED Notes (Signed)
Pt states she woke up this morning and was dizzy/nauseous and weak. Pt has not taken her BP meds x2 weeks. EMS vitals 136/87, 60 HR, 99% on room air, CBG 101.

## 2013-04-29 NOTE — ED Notes (Signed)
Pt ambulated in halls with no problem , no dizziness

## 2013-04-29 NOTE — ED Provider Notes (Signed)
Medical screening examination/treatment/procedure(s) were conducted as a shared visit with non-physician practitioner(s) and myself.  I personally evaluated the patient during the encounter.  EKG Interpretation    Date/Time:  Thursday April 29 2013 08:06:47 EST Ventricular Rate:  56 PR Interval:  151 QRS Duration: 90 QT Interval:  483 QTC Calculation: 466 R Axis:   -9 Text Interpretation:  Age not entered, assumed to be  68 years old for purpose of ECG interpretation Sinus rhythm Inferior infarct, age indeterminate No significant change since last tracing Confirmed by [facility], Dekisha Mesmer [phone]([id number]) on 04/29/2013 8:18:09 AM           Last known well bedtime last night woke this AM intermittent positional vertigo without headache confusion change in speech vision or swallowing without focal or lateralizing weakness numbness or incoordination it is positional room spinning vertigo with nausea.  Denise HornJohn M Zaryia Markel, MD 04/29/13 2107

## 2013-04-29 NOTE — ED Provider Notes (Signed)
CSN: [id number]     Arrival date & time 04/29/13  0804 History   First MD Initiated Contact with Patient 04/29/13 [phone]     Chief Complaint  Patient presents with  . Weakness   (Consider location/radiation/quality/duration/timing/severity/associated sxs/prior Treatment) HPI Comments: Pt states that when she got up to go to the restroom this morning she was dizzy( room spinning) and weakness. Denies symptoms when laying in bed, more with change of position. Nausea with the dizziness. Denies cp, sob, cough, fever, n/v/d. No history of similar symptoms. No unilateral weakness, numbness or blurred vision  The history is provided by the patient. No language interpreter was used.    Past Medical History  Diagnosis Date  . Hypertension   . Hyperlipidemia   . GERD (gastroesophageal reflux disease)   . Arthritis    Past Surgical History  Procedure Laterality Date  . Neck surgery    . Abdominal hysterectomy    . Replacement total knee bilateral    . Back surgery    . Thyroid surgery    . Anterior cervical decomp/discectomy fusion N/A 07/20/2012    Procedure: ANTERIOR CERVICAL DISCECTOMY FUSION C5-6, C6-7 with transgraft(Alphatec), local bone graft, plate and screws ;  Surgeon: Kerrin Champagne, MD;  Location: [facility] OR;  Service: Orthopedics;  Laterality: N/A;   History reviewed. No pertinent family history. History  Substance Use Topics  . Smoking status: Never Smoker   . Smokeless tobacco: Not on file  . Alcohol Use: No   OB History   Grav Para Term Preterm Abortions TAB SAB Ect Mult Living                 Review of Systems  Constitutional: Negative.   Eyes: Negative for visual disturbance.  Respiratory: Negative.  Negative for shortness of breath.   Cardiovascular: Negative.  Negative for chest pain.    Allergies  Lisinopril; Penicillins; and Tramadol hcl  Home Medications   Current Outpatient Rx  Name  Route  Sig  Dispense  Refill  . diclofenac (VOLTAREN) 75 MG EC tablet  Oral   Take 75 mg by mouth daily as needed.          . Multiple Vitamins-Minerals (MULTIVITAMINS THER. W/MINERALS) TABS   Oral   Take 1 tablet by mouth daily.         . pantoprazole (PROTONIX) 40 MG tablet   Oral   Take 40 mg by mouth daily.          . simvastatin (ZOCOR) 40 MG tablet   Oral   Take 40 mg by mouth every evening.         [name] HYDROcodone-acetaminophen (NORCO) 5-325 MG per tablet   Oral   Take 1 tablet by mouth every 6 (six) hours as needed for pain.   15 tablet   0   . losartan (COZAAR) 100 MG tablet   Oral   Take 100 mg by mouth daily.           BP 140/87  Temp(Src) 97.5 F (36.4 C) (Oral)  Resp 14  SpO2 100% Physical Exam  Nursing note and vitals reviewed. Constitutional: She is oriented to person, place, and time. She appears well-developed and well-nourished.  HENT:  Head: Normocephalic and atraumatic.  Cardiovascular: Normal rate and regular rhythm.   Pulmonary/Chest: Effort normal and breath sounds normal.  Musculoskeletal: Normal range of motion.  Neurological: She is alert and oriented to person, place, and time. She exhibits normal muscle tone. Coordination normal.  Negative finger to nose:negative romberg:negative scew  Skin: Skin is warm and dry.  Psychiatric: She has a normal mood and affect.    ED Course  Procedures (including critical care time) Labs Review Labs Reviewed  CBC WITH DIFFERENTIAL - Abnormal; Notable for the following:    WBC 3.8 (*)    RDW 15.7 (*)    Neutrophils Relative % 39 (*)    Lymphocytes Relative 51 (*)    Neutro Abs 1.5 (*)    All other components within normal limits  BASIC METABOLIC PANEL - Abnormal; Notable for the following:    Glucose, Bld 135 (*)    GFR calc non Af Amer 55 (*)    GFR calc Af Amer 63 (*)    All other components within normal limits  URINALYSIS, ROUTINE W REFLEX MICROSCOPIC - Abnormal; Notable for the following:    APPearance CLOUDY (*)    All other components within normal  limits   Imaging Review Dg Chest 2 View  04/29/2013   CLINICAL DATA:  Weakness, dizziness, nausea  EXAM: CHEST  2 VIEW  COMPARISON:  07/10/2012  FINDINGS: There is no focal parenchymal opacity, pleural effusion, or pneumothorax. The heart and mediastinal contours are unremarkable.  There is mild thoracic spine spondylosis.  IMPRESSION: No active cardiopulmonary disease.   Electronically Signed   By: Elige KoHetal  Patel   On: 04/29/2013 09:01   Ct Head Wo Contrast  04/29/2013   CLINICAL DATA:  Dizziness, nausea  EXAM: CT HEAD WITHOUT CONTRAST  TECHNIQUE: Contiguous axial images were obtained from the base of the skull through the vertex without intravenous contrast.  COMPARISON:  None.  FINDINGS: There is no evidence of mass effect, midline shift or extra-axial fluid collections. There is no evidence of a space-occupying lesion or intracranial hemorrhage. There is no evidence of a cortical-based area of acute infarction. There is periventricular white matter low attenuation likely secondary to microangiopathy.  The ventricles and sulci are appropriate for the patient's age. The basal cisterns are patent.  Visualized portions of the orbits are unremarkable. The visualized portions of the paranasal sinuses and mastoid air cells are unremarkable.  The osseous structures are unremarkable.  IMPRESSION: No acute intracranial pathology.   Electronically Signed   By: Elige KoHetal  Patel   On: 04/29/2013 10:07    EKG Interpretation    Date/Time:  Thursday April 29 2013 08:06:47 EST Ventricular Rate:  56 PR Interval:  151 QRS Duration: 90 QT Interval:  483 QTC Calculation: 466 R Axis:   -9 Text Interpretation:  Age not entered, assumed to be  68 years old for purpose of ECG interpretation Sinus rhythm Inferior infarct, age indeterminate No significant change since last tracing Confirmed by [facility], JOHN ([id number]) on 04/29/2013 8:18:09 AM            MDM   1. Vertigo     10:46 AM Pt is feeling a lot better but  is still some what dizzy:will give another dose of meclizine and have her ambulate 11:55 AM Pt is able to ambulate at this time:pt is feeling better:pt not having any neuro deficits:will send home with meclizine and referral to ent   Teressa LowerVrinda Gabriella Guile, NP 04/29/13 1155  Teressa LowerVrinda Bertrice Leder, NP 04/29/13 1156

## 2013-04-29 NOTE — Discharge Instructions (Signed)

## 2013-07-19 ENCOUNTER — Other Ambulatory Visit: Payer: Self-pay

## 2013-07-19 DIAGNOSIS — Z1231 Encounter for screening mammogram for malignant neoplasm of breast: Secondary | ICD-10-CM

## 2013-07-27 ENCOUNTER — Ambulatory Visit: Payer: Medicare Other

## 2013-08-11 ENCOUNTER — Ambulatory Visit
Admission: RE | Admit: 2013-08-11 | Discharge: 2013-08-11 | Disposition: A | Discharge status: Home / Self Care | Payer: Commercial Managed Care - HMO | Source: Ambulatory Visit

## 2013-08-11 ENCOUNTER — Ambulatory Visit: Payer: Medicare Other

## 2013-08-11 DIAGNOSIS — Z1231 Encounter for screening mammogram for malignant neoplasm of breast: Secondary | ICD-10-CM

## 2013-08-20 ENCOUNTER — Telehealth: Payer: Self-pay | Admitting: Gastroenterology

## 2013-08-20 NOTE — Telephone Encounter (Signed)
Left a message for patient to call me. 

## 2013-08-23 ENCOUNTER — Encounter: Payer: Self-pay | Admitting: Gastroenterology

## 2013-08-30 NOTE — Telephone Encounter (Signed)
Patient has scheduled OV on 10/12/13.

## 2013-10-12 ENCOUNTER — Ambulatory Visit (INDEPENDENT_AMBULATORY_CARE_PROVIDER_SITE_OTHER): Payer: Commercial Managed Care - HMO | Admitting: Gastroenterology

## 2013-10-12 ENCOUNTER — Other Ambulatory Visit (INDEPENDENT_AMBULATORY_CARE_PROVIDER_SITE_OTHER): Payer: Commercial Managed Care - HMO

## 2013-10-12 ENCOUNTER — Encounter: Payer: Self-pay | Admitting: Gastroenterology

## 2013-10-12 VITALS — BP 138/92 | HR 76 | Ht 65.0 in | Wt 258.0 lb

## 2013-10-12 DIAGNOSIS — R1013 Epigastric pain: Secondary | ICD-10-CM

## 2013-10-12 LAB — COMPREHENSIVE METABOLIC PANEL
ALK PHOS: 58 U/L (ref 39–117)
ALT: 8 U/L (ref 0–35)
AST: 18 U/L (ref 0–37)
Albumin: 4 g/dL (ref 3.5–5.2)
BILIRUBIN TOTAL: 0.4 mg/dL (ref 0.2–1.2)
BUN: 14 mg/dL (ref 6–23)
CO2: 28 mEq/L (ref 19–32)
CREATININE: 1.1 mg/dL (ref 0.4–1.2)
Calcium: 9.4 mg/dL (ref 8.4–10.5)
Chloride: 101 mEq/L (ref 96–112)
GFR: 61.82 mL/min (ref >60.00)
GLUCOSE: 89 mg/dL (ref 70–99)
Potassium: 4.4 mEq/L (ref 3.5–5.1)
Sodium: 137 mEq/L (ref 135–145)
Total Protein: 7.4 g/dL (ref 6.0–8.3)

## 2013-10-12 LAB — CBC WITH DIFFERENTIAL/PLATELET
Basophils Absolute: 0 10*3/uL (ref 0.0–0.1)
Basophils Relative: 0.6 % (ref 0.0–3.0)
EOS ABS: 0.2 10*3/uL (ref 0.0–0.7)
Eosinophils Relative: 4.2 % (ref 0.0–5.0)
HCT: 37.9 % (ref 36.0–46.0)
Hemoglobin: 12.4 g/dL (ref 12.0–15.0)
LYMPHS PCT: 36.1 % (ref 12.0–46.0)
Lymphs Abs: 1.8 10*3/uL (ref 0.7–4.0)
MCHC: 32.8 g/dL (ref 30.0–36.0)
MCV: 79.1 fl (ref 78.0–100.0)
MONO ABS: 0.5 10*3/uL (ref 0.1–1.0)
Monocytes Relative: 10.7 % (ref 3.0–12.0)
NEUTROS PCT: 48.4 % (ref 43.0–77.0)
Neutro Abs: 2.4 10*3/uL (ref 1.4–7.7)
PLATELETS: 210 10*3/uL (ref 150.0–400.0)
RBC: 4.79 Mil/uL (ref 3.87–5.11)
RDW: 15.9 % — ABNORMAL HIGH (ref 11.5–15.5)
WBC: 5 10*3/uL (ref 4.0–10.5)

## 2013-10-12 NOTE — Progress Notes (Signed)
HPI: This is a    very pleasant 68 year old woman who I am meeting for the first time today.  She was previously a patient of Dr. Jarold MottoPatterson who has since retired. He last saw her over 3 years ago at the time of upper and lower endoscopies, summarized below. She has numerous upper and lower GI complaints.  Still having problems with her stomach. She eats and feels bloated, then abd pains, will have vomiting.    Will have vomiting about every other day.    Feels like food is going sideways.  Has been going on for 6-7 months.  Takes protonix daily.  She has been gaining weight.  Labs 07/2013: cmet normal, cbc normal  Does not take voltaren of norco daily.   colonoscopy Jarold Mottoatterson 04/2010 1) No polyps or cancers 2) Otherwise normal examinationRECOMMENDATIONS:1) high fiber diet2) metamucil or benefiber3) Continue current colorectal screening recommendations for"routine risk" patients with a repeat colonoscopy in 10 years.   EGD 04/2010 Patterson1) Schatzki's ring in the distal jejunum 2) Otherwise normal examination3) Otherwise normal esophagus4) Otherwise normal duodenum5) Otherwise normal stomach 6) A hiatal hernia 1.CHRONIC GERD 2.SCHATZSKI"S RING DILATED with 54 Fr maloney.3.R/O H.PYLORI RECOMMENDATIONS:1) Rx CLO if positive2) continue PP3) continue current medications   Review of systems: Pertinent positive and negative review of systems were noted in the above HPI section. Complete review of systems was performed and was otherwise normal.    Past Medical History  Diagnosis Date  . Hypertension   . Hyperlipidemia   . GERD (gastroesophageal reflux disease)   . Arthritis     Past Surgical History  Procedure Laterality Date  . Neck surgery    . Abdominal hysterectomy    . Replacement total knee bilateral    . Back surgery    . Thyroid surgery    . Anterior cervical decomp/discectomy fusion N/A 07/20/2012    Procedure: ANTERIOR CERVICAL DISCECTOMY FUSION C5-6, C6-7 with  transgraft(Alphatec), local bone graft, plate and screws ;  Surgeon: Kerrin ChampagneJames E Nitka, MD;  Location: [facility] OR;  Service: Orthopedics;  Laterality: N/A;    Current Outpatient Prescriptions  Medication Sig Dispense Refill  . aspirin 81 MG tablet Take 81 mg by mouth daily.      . diclofenac (VOLTAREN) 75 MG EC tablet Take 75 mg by mouth daily as needed.       [name]. HYDROcodone-acetaminophen (NORCO) 5-325 MG per tablet Take 1 tablet by mouth every 6 (six) hours as needed for pain.  15 tablet  0  . Multiple Vitamins-Minerals (MULTIVITAMINS THER. W/MINERALS) TABS Take 1 tablet by mouth daily.      . pantoprazole (PROTONIX) 40 MG tablet Take 40 mg by mouth daily.       . simvastatin (ZOCOR) 40 MG tablet Take 40 mg by mouth every evening.      [name]. losartan (COZAAR) 100 MG tablet Take 100 mg by mouth daily.        No current facility-administered medications for this visit.    Allergies as of 10/12/2013 - Review Complete 10/12/2013  Allergen Reaction Noted  . Lisinopril Swelling 11/10/2011  . Penicillins Rash   . Tramadol hcl Rash     Family History  Problem Relation Age of Onset  . Ovarian cancer Mother   . Prostate cancer Father   . Breast cancer Sister   . Prostate cancer Brother   . Cystic fibrosis Sister   . Heart disease Mother   . Heart disease Father   . Heart disease Sister  History   Social History  . Marital Status: Single    Spouse Name: N/A    Number of Children: 2  . Years of Education: N/A   Occupational History  . Retired    Social History Main Topics  . Smoking status: Never Smoker   . Smokeless tobacco: Never Used  . Alcohol Use: No  . Drug Use: No  . Sexual Activity: Not on file   Other Topics Concern  . Not on file   Social History Narrative  . No narrative on file       Physical Exam: BP 138/92  Pulse 76  Ht 5\' 5"  (1.651 m)  Wt 258 lb (117.028 kg)  BMI 42.93 kg/m2 Constitutional: generally well-appearing, except for morbid obesity   Psychiatric: alert and oriented x3 Eyes: extraocular movements intact Mouth: oral pharynx moist, no lesions Neck: supple no lymphadenopathy Cardiovascular: heart regular rate and rhythm Lungs: clear to auscultation bilaterally Abdomen: soft, nontender, nondistended, no obvious ascites, no peritoneal signs, normal bowel sounds Extremities: no lower extremity edema bilaterally Skin: no lesions on visible extremities    Assessment and plan: 68 y.o. female with morbid obesity, intermittent epigastric pains, intermittent vomiting  She had an EGD about 3 years ago I do not think that needs to be repeated now. She has actually gained weight over the past year and is quite morbidly obese mouth a BMI of about 43. Perhaps her symptoms are related to her morbid obesity. Gallbladder pathology can present similarly with intermittent epigastric pains and I am going to have her get an abdominal ultrasound as well as labs including a CBC, complete metabolic profile. If the ultrasound is normal the consider HIDA scan to estimate her gallbladder ejection fraction.

## 2013-10-12 NOTE — Patient Instructions (Addendum)
[facility] on 10-14-2013 at 730 am. Please arrive 15 minutes prior to your appointment for registration. Make certain not to have anything to eat or drink 6 hours prior to your appointment. Should you need to reschedule your appointment, please contact radiology at [phone]. This test typically takes about 30 minutes to perform.  You will have labs checked today in the basement lab.  Please head down after you check out with the front desk  (cbc, cmet). If the US shows a normal looking gallbladder, then you may need HIDA scan to see how your GB functions.

## 2013-10-14 ENCOUNTER — Ambulatory Visit (HOSPITAL_COMMUNITY)
Admission: RE | Admit: 2013-10-14 | Discharge: 2013-10-14 | Disposition: A | Discharge status: Home / Self Care | Payer: Medicare HMO | Source: Ambulatory Visit | Attending: Gastroenterology | Admitting: Gastroenterology

## 2013-10-14 DIAGNOSIS — R1013 Epigastric pain: Secondary | ICD-10-CM

## 2013-10-15 ENCOUNTER — Other Ambulatory Visit: Payer: Self-pay

## 2013-10-15 DIAGNOSIS — R1084 Generalized abdominal pain: Secondary | ICD-10-CM

## 2013-10-15 DIAGNOSIS — R112 Nausea with vomiting, unspecified: Secondary | ICD-10-CM

## 2013-10-15 NOTE — Progress Notes (Signed)
[facility] on 10/18/13. Please arrive 15 minutes prior to your scheduled appointment at  1 pm . Make certain not to have anything to eat or drink at least 6 hours prior to your test. Should this appointment date or time not work well for you, please call radiology scheduling at [phone].  _____________________________________________________________________ hepatobiliary (HIDA) scan is an imaging procedure used to diagnose problems in the liver, gallbladder and bile ducts. In the HIDA scan, a radioactive chemical or tracer is injected into a vein in your arm. The tracer is handled by the liver like bile. Bile is a fluid produced and excreted by your liver that helps your digestive system break down fats in the foods you eat. Bile is stored in your gallbladder and the gallbladder releases the bile when you eat a meal. A special nuclear medicine scanner (gamma camera) tracks the flow of the tracer from your liver into your gallbladder and small intestine.  During your HIDA scan  You'll be asked to change into a hospital gown before your HIDA scan begins. Your health care team will position you on a table, usually on your back. The radioactive tracer is then injected into a vein in your arm.The tracer travels through your bloodstream to your liver, where it's taken up by the bile-producing cells. The radioactive tracer travels with the bile from your liver into your gallbladder and through your bile ducts to your small intestine.You may feel some pressure while the radioactive tracer is injected into your vein. As you lie on the table, a special gamma camera is positioned over your abdomen taking pictures of the tracer as it moves through your body. The gamma camera takes pictures continually for about an hour. You'll need to keep still during the HIDA scan. This can become uncomfortable, but you may find that you can lessen the discomfort by  taking deep breaths and thinking about other things. Tell your health care team if you're uncomfortable. The radiologist will watch on a computer the progress of the radioactive tracer through your body. The HIDA scan may be stopped when the radioactive tracer is seen in the gallbladder and enters your small intestine. This typically takes about an hour. In some cases extra imaging will be performed if original images aren't satisfactory, if morphine is given to help visualize the gallbladder or if the medication CCK is given to look at the contraction of the gallbladder. This test typically takes 2 hours to complete. ________________________________________________________________________  The patient has been notified of this information and all questions answered.

## 2013-10-18 ENCOUNTER — Ambulatory Visit (HOSPITAL_COMMUNITY): Payer: Medicare HMO

## 2013-11-03 ENCOUNTER — Encounter (HOSPITAL_COMMUNITY)
Admission: RE | Admit: 2013-11-03 | Discharge: 2013-11-03 | Disposition: A | Discharge status: Home / Self Care | Payer: Medicare HMO | Source: Ambulatory Visit | Attending: Gastroenterology | Admitting: Gastroenterology

## 2013-11-03 ENCOUNTER — Encounter (HOSPITAL_COMMUNITY): Payer: Self-pay

## 2013-11-03 DIAGNOSIS — R1084 Generalized abdominal pain: Secondary | ICD-10-CM | POA: Insufficient documentation

## 2013-11-03 DIAGNOSIS — R112 Nausea with vomiting, unspecified: Secondary | ICD-10-CM | POA: Insufficient documentation

## 2013-11-03 MED ORDER — TECHNETIUM TC 99M MEBROFENIN IV KIT
5.1000 | PACK | Freq: Once | INTRAVENOUS | Status: AC | PRN
  Administered 2013-11-03: 5.1 via INTRAVENOUS

## 2013-11-03 MED ORDER — SINCALIDE 5 MCG IJ SOLR
0.0200 ug/kg | Freq: Once | INTRAMUSCULAR | Status: AC
  Administered 2013-11-03: 2 ug via INTRAVENOUS

## 2013-11-05 ENCOUNTER — Telehealth: Payer: Self-pay | Admitting: Gastroenterology

## 2013-11-05 NOTE — Telephone Encounter (Signed)
The pt continues to have abd pain and vomiting EGD and previsit has been scheduled

## 2013-11-05 NOTE — Telephone Encounter (Signed)
No answer and no voice mail.

## 2013-11-05 NOTE — Telephone Encounter (Signed)
It has been a very busy week, I apologize.  The HIDA scan was normal.  Her GB seems to be working normally. IF she still has abdominal pains, vomiting would proceed with repeat EGD.

## 2013-11-05 NOTE — Telephone Encounter (Signed)
Have you reviewed the HIDA?

## 2013-12-14 ENCOUNTER — Encounter: Payer: Self-pay | Admitting: Gastroenterology

## 2013-12-22 ENCOUNTER — Ambulatory Visit (AMBULATORY_SURGERY_CENTER): Payer: Self-pay

## 2013-12-22 VITALS — Ht 63.25 in | Wt 253.0 lb

## 2013-12-22 DIAGNOSIS — R1013 Epigastric pain: Secondary | ICD-10-CM

## 2013-12-22 NOTE — Progress Notes (Signed)
No allergies to eggs or soy No home oxygen No past problems with anesthesia No diet/weight loss meds  No email 

## 2013-12-29 ENCOUNTER — Encounter: Payer: Commercial Managed Care - HMO | Admitting: Gastroenterology

## 2014-01-05 ENCOUNTER — Encounter: Payer: Self-pay | Admitting: Gastroenterology

## 2014-01-05 ENCOUNTER — Ambulatory Visit (AMBULATORY_SURGERY_CENTER): Payer: Commercial Managed Care - HMO | Admitting: Gastroenterology

## 2014-01-05 VITALS — BP 118/70 | HR 60 | Temp 96.6°F | Resp 19 | Ht 63.0 in | Wt 253.0 lb

## 2014-01-05 DIAGNOSIS — K2970 Gastritis, unspecified, without bleeding: Secondary | ICD-10-CM

## 2014-01-05 DIAGNOSIS — R1013 Epigastric pain: Secondary | ICD-10-CM

## 2014-01-05 DIAGNOSIS — K2990 Gastroduodenitis, unspecified, without bleeding: Secondary | ICD-10-CM

## 2014-01-05 MED ORDER — SODIUM CHLORIDE 0.9 % IV SOLN: 500.0000 mL | INTRAVENOUS | Status: DC

## 2014-01-05 NOTE — Op Note (Signed)
[facility] [address] [place], [address]   ENDOSCOPY PROCEDURE REPORT  PATIENT: Denise, Forbes  MR#: [id number] BIRTHDATE: 1945/11/12 , 67  yrs. old GENDER: Female ENDOSCOPIST: Rachael Fee, MD REFERRED BY:  Dorothyann Peng, M.D. PROCEDURE DATE:  01/05/2014 PROCEDURE:  EGD w/ biopsy ASA CLASS:     Class III INDICATIONS:  abd pain, bloating after eating; [place] normal, HIDA normal, labs normal; +weight gain. MEDICATIONS: MAC sedation, administered by CRNA and propofol (Diprivan)  IV TOPICAL ANESTHETIC: none  DESCRIPTION OF PROCEDURE: After the risks benefits and alternatives of the procedure were thoroughly explained, informed consent was obtained.  The LB [id number] endoscope was introduced through the mouth and advanced to the second portion of the duodenum. Without limitations.  The instrument was slowly withdrawn as the mucosa was fully examined.  There was mild, non-specific distal gastritis.  This was biopsied and sent to pathology.  The examination was otherwise normal. Retroflexed views revealed no abnormalities.     The scope was then withdrawn from the patient and the procedure completed. COMPLICATIONS: There were no complications.  ENDOSCOPIC IMPRESSION: There was mild, non-specific distal gastritis.  This was biopsied and sent to pathology.  The examination was otherwise normal.  RECOMMENDATIONS: If pathology shows H.  pylori, you will be started on appropriate antibiotics.    eSigned:  Rachael Fee, MD 01/05/2014 9:50 AM

## 2014-01-05 NOTE — Patient Instructions (Signed)

## 2014-01-05 NOTE — Progress Notes (Signed)
Report to rn , patient awakening, vss 

## 2014-01-05 NOTE — Progress Notes (Signed)
Called to room to assist during endoscopic procedure.  Patient ID and intended procedure confirmed with present staff. Received instructions for my participation in the procedure from the performing physician.  

## 2014-01-06 ENCOUNTER — Telehealth: Payer: Self-pay | Admitting: *Deleted

## 2014-01-06 NOTE — Telephone Encounter (Signed)
  Follow up Call-  Call back number 01/05/2014  Post procedure Call Back phone  # [phone]  Permission to leave phone message Yes     Patient questions:  Do you have a fever, pain , or abdominal swelling? No. Pain Score  0 *  Have you tolerated food without any problems? Yes.    Have you been able to return to your normal activities? Yes.    Do you have any questions about your discharge instructions: Diet   No. Medications  No. Follow up visit  No.  Do you have questions or concerns about your Care? No.  Actions: * If pain score is 4 or above: No action needed, pain <4.

## 2014-01-11 ENCOUNTER — Encounter: Payer: Self-pay | Admitting: Gastroenterology

## 2014-04-01 ENCOUNTER — Emergency Department (HOSPITAL_COMMUNITY)
Admission: EM | Admit: 2014-04-01 | Discharge: 2014-04-02 | Disposition: A | Discharge status: Home / Self Care | Payer: No Typology Code available for payment source | Attending: Emergency Medicine | Admitting: Emergency Medicine

## 2014-04-01 ENCOUNTER — Emergency Department (HOSPITAL_COMMUNITY): Payer: No Typology Code available for payment source

## 2014-04-01 ENCOUNTER — Encounter (HOSPITAL_COMMUNITY): Payer: Self-pay | Admitting: *Deleted

## 2014-04-01 DIAGNOSIS — I10 Essential (primary) hypertension: Secondary | ICD-10-CM | POA: Diagnosis not present

## 2014-04-01 DIAGNOSIS — K219 Gastro-esophageal reflux disease without esophagitis: Secondary | ICD-10-CM | POA: Diagnosis not present

## 2014-04-01 DIAGNOSIS — Y998 Other external cause status: Secondary | ICD-10-CM | POA: Diagnosis not present

## 2014-04-01 DIAGNOSIS — S3992XA Unspecified injury of lower back, initial encounter: Secondary | ICD-10-CM | POA: Diagnosis present

## 2014-04-01 DIAGNOSIS — Y92410 Unspecified street and highway as the place of occurrence of the external cause: Secondary | ICD-10-CM | POA: Diagnosis not present

## 2014-04-01 DIAGNOSIS — S199XXA Unspecified injury of neck, initial encounter: Secondary | ICD-10-CM | POA: Diagnosis not present

## 2014-04-01 DIAGNOSIS — Z9889 Other specified postprocedural states: Secondary | ICD-10-CM | POA: Insufficient documentation

## 2014-04-01 DIAGNOSIS — E785 Hyperlipidemia, unspecified: Secondary | ICD-10-CM | POA: Insufficient documentation

## 2014-04-01 DIAGNOSIS — Y9389 Activity, other specified: Secondary | ICD-10-CM | POA: Diagnosis not present

## 2014-04-01 DIAGNOSIS — Z8739 Personal history of other diseases of the musculoskeletal system and connective tissue: Secondary | ICD-10-CM | POA: Insufficient documentation

## 2014-04-01 DIAGNOSIS — Z791 Long term (current) use of non-steroidal anti-inflammatories (NSAID): Secondary | ICD-10-CM | POA: Insufficient documentation

## 2014-04-01 DIAGNOSIS — Z79899 Other long term (current) drug therapy: Secondary | ICD-10-CM | POA: Insufficient documentation

## 2014-04-01 DIAGNOSIS — M549 Dorsalgia, unspecified: Secondary | ICD-10-CM

## 2014-04-01 DIAGNOSIS — Z880 Allergy status to penicillin: Secondary | ICD-10-CM | POA: Insufficient documentation

## 2014-04-01 MED ORDER — DIAZEPAM 2 MG PO TABS
2.0000 mg | ORAL_TABLET | Freq: Once | ORAL | Status: AC
  Administered 2014-04-01: 2 mg via ORAL

## 2014-04-01 MED ORDER — HYDROCODONE-ACETAMINOPHEN 5-325 MG PO TABS
2.0000 | ORAL_TABLET | Freq: Once | ORAL | Status: AC
  Administered 2014-04-01: 2 via ORAL
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
## 2014-04-01 NOTE — ED Notes (Signed)
Dr.Lockwood at bedside  

## 2014-04-02 MED ORDER — NAPROXEN 500 MG PO TABS: 500.0000 mg | ORAL_TABLET | Freq: Two times a day (BID) | ORAL | Status: DC

## 2014-04-02 MED ORDER — METHOCARBAMOL 500 MG PO TABS: 500.0000 mg | ORAL_TABLET | Freq: Two times a day (BID) | ORAL | Status: DC | PRN
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
## 2014-04-26 ENCOUNTER — Ambulatory Visit: Payer: Commercial Managed Care - HMO | Attending: Specialist | Admitting: Physical Therapy

## 2014-04-26 DIAGNOSIS — M549 Dorsalgia, unspecified: Secondary | ICD-10-CM | POA: Insufficient documentation

## 2014-04-26 DIAGNOSIS — M542 Cervicalgia: Secondary | ICD-10-CM

## 2014-04-26 DIAGNOSIS — G8929 Other chronic pain: Secondary | ICD-10-CM

## 2014-04-26 DIAGNOSIS — M62838 Other muscle spasm: Secondary | ICD-10-CM

## 2014-04-26 DIAGNOSIS — M6248 Contracture of muscle, other site: Secondary | ICD-10-CM | POA: Insufficient documentation

## 2014-04-26 NOTE — Patient Instructions (Addendum)
  AROM: Neck Rotation   Turn head slowly to look over one shoulder, then the other. Hold each position __3__ seconds. Repeat ___5_ times per set. Do _1___ sets per session. Do ___2-3_ sessions per day.  http://orth.exer.us/294   Copyright  VHI. All rights reserved.  AROM: Lateral Neck Flexion   Slowly tilt head toward one shoulder, then the other. Hold each position _3___ seconds. Repeat ___5_ times per set. Do ___1_ sets per session. Do 2-3__ sessions per day.  http://orth.exer.us/296   Copyright  VHI. All rights reserved.  Extension   Hands behind neck, bend head back as far as is comfortable. Hold _3___ seconds. Repeat __5__ times. Do ___2-3_ sessions per day.  Copyright  VHI. All rights reserved.  AROM: Neck Flexion   Bend head forward. Hold _3___ seconds. Repeat __5__ times per set. Do ___1_ sets per session. Do ___2-3_ sessions per day.  http://orth.exer.us/298   Copyright  VHI. All rights reserved. Axial Extension (Chin Tuck)   Pull chin in and lengthen back of neck. Hold __3-5__ seconds while counting out loud. Repeat ___3_ times. Do 2-3____ sessions per day.  DONT FORGET TO DO SHOULDER SHRUGS 5X EVERY HOUR TO GET YOUR MUSCLES MOVING.   http://gt2.exer.us/450   Copyright  VHI. All rights reserved.     Pt given written handout for sitting posture and expained importance of sitting posture and unloading spine.
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
## 2014-04-28 ENCOUNTER — Ambulatory Visit: Payer: Commercial Managed Care - HMO | Admitting: Physical Therapy

## 2014-04-28 DIAGNOSIS — G8929 Other chronic pain: Secondary | ICD-10-CM

## 2014-04-28 DIAGNOSIS — M542 Cervicalgia: Secondary | ICD-10-CM

## 2014-04-28 DIAGNOSIS — M62838 Other muscle spasm: Secondary | ICD-10-CM

## 2014-04-28 DIAGNOSIS — M549 Dorsalgia, unspecified: Secondary | ICD-10-CM

## 2014-04-28 DIAGNOSIS — M6248 Contracture of muscle, other site: Secondary | ICD-10-CM | POA: Diagnosis not present

## 2014-04-28 NOTE — Patient Instructions (Signed)
Isometric Rotation   Put left index finger on left temple. Gently try to turn head to right, pushing against finger. Hold 5____ seconds. Repeat on the left. Push and release slowly. Repeat __5__ times. Do __3__ sessions per day.  http://gt2.exer.us/24   Copyright  VHI. All rights reserved.  Isometric Lateral Flexion   Put right index finger on right temple. Gently try to move right ear toward shoulder, pushing against finger. Hold ___5_ seconds. Repeat on other side. Push and release slowly. Repeat __5__ times. Do __3__ sessions per day.  http://gt2.exer.us/22   Copyright  VHI. All rights reserved.  Isometric Flexion   Put the tips of both index fingers lightly on forehead. Gently press into fingers as if looking toward ground. Resist for __5__ seconds. Press and release slowly. Repeat __5__ times. Do __3__ sessions per day.  http://gt2.exer.us/18   Copyright  VHI. All rights reserved.  Isometric Extension   Put index fingers gently on back of head. Slowly try to look toward ceiling. Push head into fingers for _5___ seconds. Push and release slowly. Repeat __5__ times. Do ___3_ sessions per day.  http://gt2.exer.us/20   Copyright  VHI. All rights reserved.
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
## 2014-05-03 ENCOUNTER — Ambulatory Visit: Payer: Medicare HMO | Attending: Specialist | Admitting: Rehabilitation

## 2014-05-03 DIAGNOSIS — M542 Cervicalgia: Secondary | ICD-10-CM | POA: Insufficient documentation

## 2014-05-03 DIAGNOSIS — M62838 Other muscle spasm: Secondary | ICD-10-CM

## 2014-05-03 DIAGNOSIS — M549 Dorsalgia, unspecified: Secondary | ICD-10-CM | POA: Insufficient documentation

## 2014-05-03 DIAGNOSIS — M6248 Contracture of muscle, other site: Secondary | ICD-10-CM | POA: Diagnosis not present

## 2014-05-03 DIAGNOSIS — G8929 Other chronic pain: Secondary | ICD-10-CM
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
## 2014-05-03 NOTE — Patient Instructions (Signed)
Levator Stretch   Grasp seat or sit on hand on side to be stretched. Turn head toward other side and look down. Use hand on head to gently stretch neck in that position. Hold __20__ seconds. Repeat on other side. Repeat __3__ times. Do __2__ sessions per day.  http://gt2.exer.us/30     Copyright  VHI. All rights reserved.  Chin Protraction / Retraction   Slide head forward keeping chin level. Slide head back, pulling chin in. Hold each position _5__ seconds. Repeat _10__ times. Do _2__ sessions per day.  Copyright  VHI. All rights reserved.  EXTENSION: Standing - Resistance Band: Stable (Active)   Stand, right arm at side. Against yellow resistance band, draw arm backward, as far as possible, keeping elbow straight. Complete __2_ sets of _10__ repetitions. Perform _2__ sessions per day.  Resistive Band Rowing   With resistive band anchored in door, grasp both ends. Keeping elbows bent, pull back, squeezing shoulder blades together. Hold __5__ seconds. Repeat __10__ times. 2 sets. Do __2__ sessions per day.  http://[email or web address].exer.us/97

## 2014-05-05 ENCOUNTER — Ambulatory Visit: Payer: Medicare HMO | Admitting: Physical Therapy

## 2014-05-05 DIAGNOSIS — M542 Cervicalgia: Secondary | ICD-10-CM | POA: Diagnosis not present

## 2014-05-05 DIAGNOSIS — M549 Dorsalgia, unspecified: Secondary | ICD-10-CM

## 2014-05-05 DIAGNOSIS — M6248 Contracture of muscle, other site: Secondary | ICD-10-CM | POA: Diagnosis not present

## 2014-05-05 DIAGNOSIS — G8929 Other chronic pain: Secondary | ICD-10-CM

## 2014-05-05 DIAGNOSIS — M62838 Other muscle spasm: Secondary | ICD-10-CM

## 2014-05-05 NOTE — Therapy (Signed)
[place], [place], [address] Phone: [phone]   Fax:  [phone]  Physical Therapy Evaluation  Patient Details  Name: [name] MRN: [id number] Date of Birth: 1945/12/26 Referring Provider:  Glendale Chard, MD  Encounter Date: 05/05/2014    Past Medical History  Diagnosis Date  . Hypertension   . Hyperlipidemia   . GERD (gastroesophageal reflux disease)   . Arthritis     Past Surgical History  Procedure Laterality Date  . Neck surgery    . Abdominal hysterectomy    . Replacement total knee bilateral    . Back surgery    . Thyroid surgery    . Anterior cervical decomp/discectomy fusion N/A 07/20/2012    Procedure: ANTERIOR CERVICAL DISCECTOMY FUSION C5-6, C6-7 with transgraft(Alphatec), local bone graft, plate and screws ;  Surgeon: Jessy Oto, MD;  Location: [facility];  Service: Orthopedics;  Laterality: N/A;    There were no vitals taken for this visit.  Visit Diagnosis:  Muscle spasms of neck  Chronic neck and back pain      Subjective Assessment - 05/05/14 1150    Symptoms Not really having pain, since i've done some therapy.   Pertinent History xrays taken .  No evidence of fracture, but has Grade @ L4 to L5 spondylolisthesis unchanged from 2009, Back surgery fusion 2009 and neck fusion 06/2013   How long can you sit comfortably? >2 hours   How long can you stand comfortably? 2 to 3 hours but no more   How long can you walk comfortably? 20 min   Currently in Pain? No/denies   Multiple Pain Sites No                    [facility] Adult PT Treatment/Exercise - 05/05/14 1157    Neck Exercises: Seated   Neck Retraction 10 reps;5 secs  standing   Neck Retraction Limitations --  into wall    Cervical Rotation Both  2-3 x each for stretch    Lateral Flexion Both   Lateral Flexion Limitations --  2 x each side   Other Seated Exercise levator scap stretch x 30 sec each side    Lumbar Exercises: Stretches   Single Knee to Chest Stretch 3 reps;10 seconds   Lower Trunk Rotation 5 reps;10 seconds   Lower Trunk Rotation Limitations with head turns   Lumbar Exercises: Aerobic   Stationary Bike NuStep level 1, 5 min    Lumbar Exercises: Supine   Other Supine Lumbar Exercises --  Post pelvic tilt x10   Other Supine Lumbar Exercises --  pelvic tilt with march x 10, clam x10 , SLR x 10 each   Shoulder Exercises: Standing   Extension Both;20 reps;Theraband   Theraband Level (Shoulder Extension) --  red   Row Both;20 reps;Theraband   Theraband Level (Shoulder Row) --  red   Moist Heat Therapy   Number Minutes Moist Heat 15 Minutes   Moist Heat Location --  neck and back                  PT Short Term Goals - 05/03/14 1153    PT SHORT TERM GOAL #1   Title Independent with initial HEP   Time 4   Period Weeks   Status Achieved   PT SHORT TERM GOAL #2   Title Report pain decrease from 6/10 to 4/10   Time 4   Period Weeks   Status  Achieved   PT SHORT TERM GOAL #3   Title demonstrate understandiing of proper sitting posture, bodymechanics  and be more conscious of posture throughout day   Time 4   Period Weeks   Status Achieved           PT Long Term Goals - 05/03/14 1154    PT LONG TERM GOAL #1   Title demonstrate and verbalize techniques to reduce the risk of re-injury including; lifting, posture, body mechanics   Time 8   Period Weeks   Status On-going   PT LONG TERM GOAL #2   Title Pt will improve FOTO score from 54% to 37 % limitation   Time 8   Period Weeks   Status On-going   PT LONG TERM GOAL #3   Title Pt will improve AROM in order to drive without exacerbating pain   Time 8   Period Weeks   Status Achieved   PT LONG TERM GOAL #4   Title Pt will be able to decrease pain to 2/10 or less with all functional activities in home/household chores   Time 8   Period Weeks   Status On-going               Plan -  05/05/14 1219    Clinical Impression Statement Patient has decreased activity tolerance, progressing towards goals yet does need overall conditioning for improved mobility. No further goals met. Will likely cont with PT until MD appt. 05/19/14.    Pt will benefit from skilled therapeutic intervention in order to improve on the following deficits Decreased activity tolerance;Postural dysfunction;Pain;Decreased range of motion;Improper body mechanics;Increased fascial restricitons;Increased muscle spasms;Decreased strength;Decreased mobility   Rehab Potential Good   PT Frequency 2x / week   PT Duration 8 weeks   PT Treatment/Interventions Electrical Stimulation;Cryotherapy;ADLs/Self Care Home Management;Moist Heat;Ultrasound;Therapeutic activities;Therapeutic exercise;Neuromuscular re-education;Manual techniques;Patient/family education;Passive range of motion;Dry needling   PT Next Visit Plan review abdominal series (clam, SLR and march)   PT Home Exercise Plan cont as previous, add in abdominals for low back, was limited by LE weakness not LBP   Consulted and Agree with Plan of Care Patient         Problem List Patient Active Problem List   Diagnosis Date Noted  . HNP (herniated nucleus pulposus), cervical 07/20/2012    Class: Acute  . Cervical spondylosis without myelopathy 07/20/2012    Class: Chronic  . Hyperlipidemia   . GERD (gastroesophageal reflux disease)   . HYPERLIPIDEMIA 04/16/2010  . HYPERTENSION 04/16/2010  . GERD 04/16/2010  . HIATAL HERNIA 04/16/2010  . OSTEOARTHRITIS 04/16/2010  . DYSPHAGIA UNSPECIFIED 04/16/2010    [name] 05/05/2014, 12:33 PM  [place], [place], [address] Phone: [phone]   Fax:  [phone]   Raeford Razor, PT 05/05/2014 12:33 PM Phone: [phone] Fax: [phone]

## 2014-05-10 ENCOUNTER — Ambulatory Visit: Payer: Medicare HMO | Admitting: Rehabilitation

## 2014-05-10 DIAGNOSIS — M549 Dorsalgia, unspecified: Secondary | ICD-10-CM

## 2014-05-10 DIAGNOSIS — M62838 Other muscle spasm: Secondary | ICD-10-CM

## 2014-05-10 DIAGNOSIS — M542 Cervicalgia: Secondary | ICD-10-CM | POA: Diagnosis not present

## 2014-05-10 DIAGNOSIS — M6248 Contracture of muscle, other site: Secondary | ICD-10-CM | POA: Diagnosis not present

## 2014-05-10 DIAGNOSIS — G8929 Other chronic pain: Secondary | ICD-10-CM

## 2014-05-10 NOTE — Therapy (Signed)
[facility] [address] [place], [place], [address] Phone: [phone]   Fax:  [phone]  Physical Therapy Treatment  Patient Details  Name: Denise Kannerallis A Forbes MRN: [id number] Date of Birth: 02-23-1946 Referring Provider:  Dorothyann PengSanders, Robyn, MD  Encounter Date: 05/10/2014      PT End of Session - 05/10/14 1156    Visit Number 5   Number of Visits 16   Date for PT Re-Evaluation 06/27/14   PT Start Time 1148   PT Stop Time 1220   PT Time Calculation (min) 32 min      Past Medical History  Diagnosis Date  . Hypertension   . Hyperlipidemia   . GERD (gastroesophageal reflux disease)   . Arthritis     Past Surgical History  Procedure Laterality Date  . Neck surgery    . Abdominal hysterectomy    . Replacement total knee bilateral    . Back surgery    . Thyroid surgery    . Anterior cervical decomp/discectomy fusion N/A 07/20/2012    Procedure: ANTERIOR CERVICAL DISCECTOMY FUSION C5-6, C6-7 with transgraft(Alphatec), local bone graft, plate and screws ;  Surgeon: Kerrin ChampagneJames E Nitka, MD;  [facility];  Service: Orthopedics;  Laterality: N/A;    There were no vitals taken for this visit.  Visit Diagnosis:  No diagnosis found.      Subjective Assessment - 05/10/14 1154    Symptoms It has taken me 3-4 days to recover from last treatment. Pain increased to 8/10 and I called the MD. He said I should not be bending due to the rod in my back. Felt better yesterday and no pain today.   Currently in Pain? No/denies   Aggravating Factors  not much anymore, walking does not aggravate anymore                    [facility] Adult PT Treatment/Exercise - 05/10/14 0001    Lumbar Exercises: Stretches   Single Knee to Chest Stretch 2 reps;20 seconds   Lower Trunk Rotation 5 reps;10 seconds   Lower Trunk Rotation Limitations with head turns   Lumbar Exercises: Aerobic   Stationary Bike Nustep Level 4 x 7 min   Lumbar Exercises:  Supine   Ab Set 10 reps;5 seconds   Other Supine Lumbar Exercises Neutral spine Tra contract with 5 second holds, then bilateral clams while maintaing core contract, 1 clam, 5 clams, 10 clams, bent knee raise x10                   PT Short Term Goals - 05/03/14 1153    PT SHORT TERM GOAL #1   Title Independent with initial HEP   Time 4   Period Weeks   Status Achieved   PT SHORT TERM GOAL #2   Title Report pain decrease from 6/10 to 4/10   Time 4   Period Weeks   Status Achieved   PT SHORT TERM GOAL #3   Title demonstrate understandiing of proper sitting posture, bodymechanics  and be more conscious of posture throughout day   Time 4   Period Weeks   Status Achieved           PT Long Term Goals - 05/03/14 1154    PT LONG TERM GOAL #1   Title demonstrate and verbalize techniques to reduce the risk of re-injury including; lifting, posture, body mechanics   Time 8   Period Weeks   Status On-going  PT LONG TERM GOAL #2   Title Pt will improve FOTO score from 54% to 37 % limitation   Time 8   Period Weeks   Status On-going   PT LONG TERM GOAL #3   Title Pt will improve AROM in order to drive without exacerbating pain   Time 8   Period Weeks   Status Achieved   PT LONG TERM GOAL #4   Title Pt will be able to decrease pain to 2/10 or less with all functional activities in home/household chores   Time 8   Period Weeks   Status On-going               Plan - 05/10/14 1242    Clinical Impression Statement Pt reports her overall pain is decreased despite her recent exacerbation. Able to repeat basic core exercises performed last visit without pain and convince patient that she is not harming her back with these basic exercises.    PT Next Visit Plan issue abdominal series (clam, march, heel slide) if pain not increased, FOTO, check goals, DC and G Code        Problem List Patient Active Problem List   Diagnosis Date Noted  . HNP (herniated nucleus  pulposus), cervical 07/20/2012    Class: Acute  . Cervical spondylosis without myelopathy 07/20/2012    Class: Chronic  . Hyperlipidemia   . GERD (gastroesophageal reflux disease)   . HYPERLIPIDEMIA 04/16/2010  . HYPERTENSION 04/16/2010  . GERD 04/16/2010  . HIATAL HERNIA 04/16/2010  . OSTEOARTHRITIS 04/16/2010  . DYSPHAGIA UNSPECIFIED 04/16/2010    Sherrie Mustache, PTA 05/10/2014, 12:52 PM  [facility] [address] [place], [place], [address] Phone: [phone]   Fax:  [phone]

## 2014-05-12 ENCOUNTER — Ambulatory Visit: Payer: Medicare HMO | Admitting: Rehabilitation

## 2014-05-12 DIAGNOSIS — G8929 Other chronic pain: Secondary | ICD-10-CM

## 2014-05-12 DIAGNOSIS — M542 Cervicalgia: Secondary | ICD-10-CM | POA: Diagnosis not present

## 2014-05-12 DIAGNOSIS — M549 Dorsalgia, unspecified: Secondary | ICD-10-CM | POA: Diagnosis not present

## 2014-05-12 DIAGNOSIS — M6248 Contracture of muscle, other site: Secondary | ICD-10-CM | POA: Diagnosis not present

## 2014-05-12 DIAGNOSIS — M62838 Other muscle spasm: Secondary | ICD-10-CM
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
## 2014-05-12 NOTE — Patient Instructions (Signed)

## 2014-05-16 DIAGNOSIS — I10 Essential (primary) hypertension: Secondary | ICD-10-CM | POA: Diagnosis not present

## 2014-05-16 DIAGNOSIS — E669 Obesity, unspecified: Secondary | ICD-10-CM | POA: Diagnosis not present

## 2014-05-16 DIAGNOSIS — Z6841 Body Mass Index (BMI) 40.0 and over, adult: Secondary | ICD-10-CM | POA: Diagnosis not present

## 2014-05-16 DIAGNOSIS — M549 Dorsalgia, unspecified: Secondary | ICD-10-CM | POA: Diagnosis not present

## 2014-05-18 ENCOUNTER — Encounter (HOSPITAL_COMMUNITY): Payer: Self-pay | Admitting: Emergency Medicine

## 2014-05-18 ENCOUNTER — Emergency Department (HOSPITAL_COMMUNITY)
Admission: EM | Admit: 2014-05-18 | Discharge: 2014-05-18 | Disposition: A | Discharge status: Home / Self Care | Payer: Commercial Managed Care - HMO | Attending: Emergency Medicine | Admitting: Emergency Medicine

## 2014-05-18 DIAGNOSIS — Z791 Long term (current) use of non-steroidal anti-inflammatories (NSAID): Secondary | ICD-10-CM | POA: Diagnosis not present

## 2014-05-18 DIAGNOSIS — IMO0001 Reserved for inherently not codable concepts without codable children: Secondary | ICD-10-CM

## 2014-05-18 DIAGNOSIS — E785 Hyperlipidemia, unspecified: Secondary | ICD-10-CM | POA: Diagnosis not present

## 2014-05-18 DIAGNOSIS — Z79899 Other long term (current) drug therapy: Secondary | ICD-10-CM | POA: Insufficient documentation

## 2014-05-18 DIAGNOSIS — R0981 Nasal congestion: Secondary | ICD-10-CM | POA: Diagnosis present

## 2014-05-18 DIAGNOSIS — M1990 Unspecified osteoarthritis, unspecified site: Secondary | ICD-10-CM | POA: Diagnosis not present

## 2014-05-18 DIAGNOSIS — I10 Essential (primary) hypertension: Secondary | ICD-10-CM | POA: Insufficient documentation

## 2014-05-18 DIAGNOSIS — K219 Gastro-esophageal reflux disease without esophagitis: Secondary | ICD-10-CM | POA: Diagnosis not present

## 2014-05-18 DIAGNOSIS — J00 Acute nasopharyngitis [common cold]: Secondary | ICD-10-CM | POA: Diagnosis not present

## 2014-05-18 DIAGNOSIS — Z880 Allergy status to penicillin: Secondary | ICD-10-CM | POA: Insufficient documentation

## 2014-05-18 MED ORDER — FLUTICASONE PROPIONATE 50 MCG/ACT NA SUSP: 2.0000 | Freq: Every day | NASAL | Status: DC

## 2014-05-18 NOTE — ED Notes (Signed)
Pt. reports nasal congestion onset today , denies cough or congestion .

## 2014-05-18 NOTE — ED Provider Notes (Signed)
CSN: [id number]     Arrival date & time 05/18/14  2257 History  This chart was scribed for non-physician practitioner, Terri Piedraourtney Forcucci, PA-C,working with Denise Severinlga Otter, MD, by Karle PlumberJennifer Tensley, ED Scribe. This patient was seen in room TR07C/TR07C and the patient's care was started at 11:13 PM.  Chief Complaint  Patient presents with  . Nasal Congestion   The history is provided by the patient. No language interpreter was used.    HPI Comments:  [name] is a 69 y.o. female who presents to the Emergency Department complaining of worsening nasal congestion and rhinorrhea that started earlier this evening. She states she has taken half a dose of Mucinex and Benadryl 50 mg earlier this evening with no relief of her symptoms. She denies modifying factors. Denies fever, chills, nausea, vomiting, otalgia, cough or facial pain. PMH of HTN, HLD, GERD and arthritis.  Past Medical History  Diagnosis Date  . Hypertension   . Hyperlipidemia   . GERD (gastroesophageal reflux disease)   . Arthritis    Past Surgical History  Procedure Laterality Date  . Neck surgery    . Abdominal hysterectomy    . Replacement total knee bilateral    . Back surgery    . Thyroid surgery    . Anterior cervical decomp/discectomy fusion N/A 07/20/2012    Procedure: ANTERIOR CERVICAL DISCECTOMY FUSION C5-6, C6-7 with transgraft(Alphatec), local bone graft, plate and screws ;  Surgeon: Kerrin ChampagneJames E Nitka, MD;  [facility];  Service: Orthopedics;  Laterality: N/A;   Family History  Problem Relation Age of Onset  . Ovarian cancer Mother   . Heart disease Mother   . Prostate cancer Father   . Heart disease Father   . Colon cancer Father   . Breast cancer Sister   . Prostate cancer Brother   . Cystic fibrosis Sister   . Heart disease Sister   . Pancreatic cancer Neg Hx   . Rectal cancer Neg Hx   . Stomach cancer Neg Hx    History  Substance Use Topics  . Smoking status: Never Smoker   . Smokeless tobacco:  Never Used  . Alcohol Use: No   OB History    No data available     Review of Systems  Constitutional: Negative for fever and chills.  HENT: Positive for congestion and rhinorrhea. Negative for ear pain and facial swelling.   Respiratory: Negative for cough.   Gastrointestinal: Negative for nausea and vomiting.  All other systems reviewed and are negative.   Allergies  Lisinopril; Penicillins; and Tramadol hcl  Home Medications   Prior to Admission medications   Medication Sig Start Date End Date Taking? Authorizing Provider  fluticasone (FLONASE) 50 MCG/ACT nasal spray Place 2 sprays into both nostrils daily. 05/18/14   Ravon Mortellaro A Forcucci, PA-C  HYDROcodone-acetaminophen (NORCO/VICODIN) 5-325 MG per tablet Take 1 tablet by mouth every 6 (six) hours as needed for moderate pain.    Historical Provider, MD  losartan (COZAAR) 100 MG tablet Take 100 mg by mouth daily.  07/21/12 07/21/13  Historical Provider, MD  losartan (COZAAR) 100 MG tablet take 1 tablet by mouth once daily 10/18/13   Historical Provider, MD  methocarbamol (ROBAXIN) 500 MG tablet Take 1 tablet (500 mg total) by mouth 2 (two) times daily as needed for muscle spasms. 04/02/14   Sharlene MottsBenjamin W Cartner, PA-C  Multiple Vitamins-Minerals (MULTIVITAMINS THER. W/MINERALS) TABS Take 1 tablet by mouth daily.    Historical Provider, MD  naproxen (NAPROSYN) 500  MG tablet Take 1 tablet (500 mg total) by mouth 2 (two) times daily. 04/02/14   Earle Gell Cartner, PA-C  pantoprazole (PROTONIX) 40 MG tablet Take 40 mg by mouth daily.     Historical Provider, MD  simvastatin (ZOCOR) 40 MG tablet Take 20 mg by mouth every evening.  daily    Historical Provider, MD   Triage Vitals: BP 131/78 mmHg  Pulse 81  Temp(Src) 97.6 F (36.4 C) (Oral)  Resp 16  SpO2 98% Physical Exam  Constitutional: She is oriented to person, place, and time. She appears well-developed and well-nourished.  HENT:  Head: Normocephalic and atraumatic.  Right Ear:  Tympanic membrane, external ear and ear canal normal.  Left Ear: Tympanic membrane, external ear and ear canal normal.  Nose: Mucosal edema present.  Mouth/Throat: Uvula is midline, oropharynx is clear and moist and mucous membranes are normal.  Eyes: EOM are normal. Pupils are equal, round, and reactive to light.  Neck: Normal range of motion.  Cardiovascular: Normal rate.   Pulmonary/Chest: Effort normal.  Musculoskeletal: Normal range of motion.  Lymphadenopathy:    She has no cervical adenopathy.  Neurological: She is alert and oriented to person, place, and time.  Skin: Skin is warm and dry.  Psychiatric: She has a normal mood and affect. Her behavior is normal.  Nursing note and vitals reviewed.   ED Course  Procedures (including critical care time) DIAGNOSTIC STUDIES: Oxygen Saturation is 98% on RA, normal by my interpretation.   COORDINATION OF CARE: 11:19 PM- Advised pt to continue Mucinex and Benadryl and add Pepcid if symptoms persist. Will prescribe Flonase and advised pt to use saline nasal spray and Neti-Pot. Pt verbalizes understanding and agrees to plan.  Medications - No data to display  Labs Review Labs Reviewed - No data to display  Imaging Review No results found.   EKG Interpretation None      MDM   Final diagnoses:  Cold   Patient is a 69 year old female who presents emergency room for evaluation of nasal congestion and rhinorrhea. Physical exam is unremarkable. Vital signs are stable. Will discharge home with symptomatically treatment including Flonase, nasal spray, noted pot and have advised her to continue taking Mucinex and Benadryl. Patient states understanding and agreement at this time. Patient is stable for discharge.  I personally performed the services described in this documentation, which was scribed in my presence. The recorded information has been reviewed and is accurate.    Eben Burow, PA-C 05/18/14 2324  Olivia Mackie,  MD 05/19/14 [phone]

## 2014-05-18 NOTE — Discharge Instructions (Signed)
Upper Respiratory Infection, Adult An upper respiratory infection (URI) is also sometimes known as the common cold. The upper respiratory tract includes the nose, sinuses, throat, trachea, and bronchi. Bronchi are the airways leading to the lungs. Most people improve within 1 week, but symptoms can last up to 2 weeks. A residual cough may last even longer.  CAUSES Many different viruses can infect the tissues lining the upper respiratory tract. The tissues become irritated and inflamed and often become very moist. Mucus production is also common. A cold is contagious. You can easily spread the virus to others by oral contact. This includes kissing, sharing a glass, coughing, or sneezing. Touching your mouth or nose and then touching a surface, which is then touched by another person, can also spread the virus. SYMPTOMS  Symptoms typically develop 1 to 3 days after you come in contact with a cold virus. Symptoms vary from person to person. They may include:  Runny nose.  Sneezing.  Nasal congestion.  Sinus irritation.  Sore throat.  Loss of voice (laryngitis).  Cough.  Fatigue.  Muscle aches.  Loss of appetite.  Headache.  Low-grade fever. DIAGNOSIS  You might diagnose your own cold based on familiar symptoms, since most people get a cold 2 to 3 times a year. Your caregiver can confirm this based on your exam. Most importantly, your caregiver can check that your symptoms are not due to another disease such as strep throat, sinusitis, pneumonia, asthma, or epiglottitis. Blood tests, throat tests, and X-rays are not necessary to diagnose a common cold, but they may sometimes be helpful in excluding other more serious diseases. Your caregiver will decide if any further tests are required. RISKS AND COMPLICATIONS  You may be at risk for a more severe case of the common cold if you smoke cigarettes, have chronic heart disease (such as heart failure) or lung disease (such as asthma), or if  you have a weakened immune system. The very young and very old are also at risk for more serious infections. Bacterial sinusitis, middle ear infections, and bacterial pneumonia can complicate the common cold. The common cold can worsen asthma and chronic obstructive pulmonary disease (COPD). Sometimes, these complications can require emergency medical care and may be life-threatening. PREVENTION  The best way to protect against getting a cold is to practice good hygiene. Avoid oral or hand contact with people with cold symptoms. Wash your hands often if contact occurs. There is no clear evidence that vitamin C, vitamin E, echinacea, or exercise reduces the chance of developing a cold. However, it is always recommended to get plenty of rest and practice good nutrition. TREATMENT  Treatment is directed at relieving symptoms. There is no cure. Antibiotics are not effective, because the infection is caused by a virus, not by bacteria. Treatment may include:  Increased fluid intake. Sports drinks offer valuable electrolytes, sugars, and fluids.  Breathing heated mist or steam (vaporizer or shower).  Eating chicken soup or other clear broths, and maintaining good nutrition.  Getting plenty of rest.  Using gargles or lozenges for comfort.  Controlling fevers with ibuprofen or acetaminophen as directed by your caregiver.  Increasing usage of your inhaler if you have asthma. Zinc gel and zinc lozenges, taken in the first 24 hours of the common cold, can shorten the duration and lessen the severity of symptoms. Pain medicines may help with fever, muscle aches, and throat pain. A variety of non-prescription medicines are available to treat congestion and runny nose. Your caregiver   can make recommendations and may suggest nasal or lung inhalers for other symptoms.  HOME CARE INSTRUCTIONS   Only take over-the-counter or prescription medicines for pain, discomfort, or fever as directed by your  caregiver.  Use a warm mist humidifier or inhale steam from a shower to increase air moisture. This may keep secretions moist and make it easier to breathe.  Drink enough water and fluids to keep your urine clear or pale yellow.  Rest as needed.  Return to work when your temperature has returned to normal or as your caregiver advises. You may need to stay home longer to avoid infecting others. You can also use a face mask and careful hand washing to prevent spread of the virus. SEEK MEDICAL CARE IF:   After the first few days, you feel you are getting worse rather than better.  You need your caregiver's advice about medicines to control symptoms.  You develop chills, worsening shortness of breath, or brown or red sputum. These may be signs of pneumonia.  You develop yellow or brown nasal discharge or pain in the face, especially when you bend forward. These may be signs of sinusitis.  You develop a fever, swollen neck glands, pain with swallowing, or white areas in the back of your throat. These may be signs of strep throat. SEEK IMMEDIATE MEDICAL CARE IF:   You have a fever.  You develop severe or persistent headache, ear pain, sinus pain, or chest pain.  You develop wheezing, a prolonged cough, cough up blood, or have a change in your usual mucus (if you have chronic lung disease).  You develop sore muscles or a stiff neck. Document Released: 10/09/2000 Document Revised: 07/08/2011 Document Reviewed: 07/21/2013 [employer] Patient Information 2015 [employer]. This information is not intended to replace advice given to you by your health care provider. Make sure you discuss any questions you have with your health care provider.  

## 2014-05-30 ENCOUNTER — Emergency Department (HOSPITAL_COMMUNITY)
Admission: EM | Admit: 2014-05-30 | Discharge: 2014-05-30 | Disposition: A | Discharge status: Home / Self Care | Payer: Commercial Managed Care - HMO | Attending: Emergency Medicine | Admitting: Emergency Medicine

## 2014-05-30 ENCOUNTER — Emergency Department (HOSPITAL_COMMUNITY): Payer: Commercial Managed Care - HMO

## 2014-05-30 ENCOUNTER — Encounter (HOSPITAL_COMMUNITY): Payer: Self-pay | Admitting: Neurology

## 2014-05-30 DIAGNOSIS — R110 Nausea: Secondary | ICD-10-CM | POA: Insufficient documentation

## 2014-05-30 DIAGNOSIS — R531 Weakness: Secondary | ICD-10-CM | POA: Diagnosis not present

## 2014-05-30 DIAGNOSIS — R1084 Generalized abdominal pain: Secondary | ICD-10-CM | POA: Insufficient documentation

## 2014-05-30 DIAGNOSIS — R1012 Left upper quadrant pain: Secondary | ICD-10-CM | POA: Insufficient documentation

## 2014-05-30 DIAGNOSIS — K219 Gastro-esophageal reflux disease without esophagitis: Secondary | ICD-10-CM | POA: Diagnosis not present

## 2014-05-30 DIAGNOSIS — R109 Unspecified abdominal pain: Secondary | ICD-10-CM | POA: Diagnosis not present

## 2014-05-30 DIAGNOSIS — E785 Hyperlipidemia, unspecified: Secondary | ICD-10-CM | POA: Diagnosis not present

## 2014-05-30 DIAGNOSIS — M1990 Unspecified osteoarthritis, unspecified site: Secondary | ICD-10-CM | POA: Diagnosis not present

## 2014-05-30 DIAGNOSIS — R1013 Epigastric pain: Secondary | ICD-10-CM | POA: Diagnosis not present

## 2014-05-30 DIAGNOSIS — Z90710 Acquired absence of both cervix and uterus: Secondary | ICD-10-CM | POA: Diagnosis not present

## 2014-05-30 DIAGNOSIS — R1011 Right upper quadrant pain: Secondary | ICD-10-CM | POA: Diagnosis not present

## 2014-05-30 DIAGNOSIS — Z79899 Other long term (current) drug therapy: Secondary | ICD-10-CM | POA: Diagnosis not present

## 2014-05-30 DIAGNOSIS — Z880 Allergy status to penicillin: Secondary | ICD-10-CM | POA: Insufficient documentation

## 2014-05-30 DIAGNOSIS — K5900 Constipation, unspecified: Secondary | ICD-10-CM | POA: Diagnosis not present

## 2014-05-30 DIAGNOSIS — R1010 Upper abdominal pain, unspecified: Secondary | ICD-10-CM | POA: Diagnosis present

## 2014-05-30 LAB — CBC WITH DIFFERENTIAL/PLATELET
BASOS ABS: 0 10*3/uL (ref 0.0–0.1)
BASOS PCT: 0 % (ref 0–1)
EOS ABS: 0 10*3/uL (ref 0.0–0.7)
Eosinophils Relative: 0 % (ref 0–5)
HEMATOCRIT: 39.9 % (ref 36.0–46.0)
Hemoglobin: 13.2 g/dL (ref 12.0–15.0)
LYMPHS ABS: 0.6 10*3/uL — AB (ref 0.7–4.0)
Lymphocytes Relative: 8 % — ABNORMAL LOW (ref 12–46)
MCH: 30.3 pg (ref 26.0–34.0)
MCHC: 33.1 g/dL (ref 30.0–36.0)
MCV: 91.5 fL (ref 78.0–100.0)
MONO ABS: 0.6 10*3/uL (ref 0.1–1.0)
MONOS PCT: 8 % (ref 3–12)
NEUTROS PCT: 84 % — AB (ref 43–77)
Neutro Abs: 5.7 10*3/uL (ref 1.7–7.7)
Platelets: 263 10*3/uL (ref 150–400)
RBC: 4.36 MIL/uL (ref 3.87–5.11)
RDW: 13 % (ref 11.5–15.5)
WBC: 6.9 10*3/uL (ref 4.0–10.5)

## 2014-05-30 LAB — COMPREHENSIVE METABOLIC PANEL
ALK PHOS: 66 U/L (ref 39–117)
ALT: 14 U/L (ref 0–35)
AST: 22 U/L (ref 0–37)
Albumin: 3.6 g/dL (ref 3.5–5.2)
Anion gap: 8 (ref 5–15)
BILIRUBIN TOTAL: 0.9 mg/dL (ref 0.3–1.2)
BUN: 12 mg/dL (ref 6–23)
CALCIUM: 8.9 mg/dL (ref 8.4–10.5)
CHLORIDE: 93 mmol/L — AB (ref 96–112)
CO2: 27 mmol/L (ref 19–32)
CREATININE: 1.24 mg/dL — AB (ref 0.50–1.10)
GFR calc Af Amer: 51 mL/min — ABNORMAL LOW (ref >90)
GFR calc non Af Amer: 44 mL/min — ABNORMAL LOW (ref >90)
GLUCOSE: 106 mg/dL — AB (ref 70–99)
Potassium: 4.3 mmol/L (ref 3.5–5.1)
SODIUM: 128 mmol/L — AB (ref 135–145)
Total Protein: 7.4 g/dL (ref 6.0–8.3)

## 2014-05-30 LAB — URINALYSIS, ROUTINE W REFLEX MICROSCOPIC
BILIRUBIN URINE: NEGATIVE
Glucose, UA: NEGATIVE mg/dL
HGB URINE DIPSTICK: NEGATIVE
Ketones, ur: NEGATIVE mg/dL
Leukocytes, UA: NEGATIVE
Nitrite: NEGATIVE
PH: 7 (ref 5.0–8.0)
PROTEIN: NEGATIVE mg/dL
Specific Gravity, Urine: 1.006 (ref 1.005–1.030)
Urobilinogen, UA: 0.2 mg/dL (ref 0.0–1.0)

## 2014-05-30 LAB — LIPASE, BLOOD: LIPASE: 44 U/L (ref 11–59)

## 2014-05-30 MED ORDER — SODIUM CHLORIDE 0.9 % IV BOLUS (SEPSIS): 1000.0000 mL | Freq: Once | INTRAVENOUS | Status: DC

## 2014-05-30 MED ORDER — ONDANSETRON HCL 4 MG/2ML IJ SOLN
4.0000 mg | Freq: Once | INTRAMUSCULAR | Status: DC
  Filled 2014-05-30: qty 2

## 2014-05-30 MED ORDER — OXYCODONE-ACETAMINOPHEN 5-325 MG PO TABS
2.0000 | ORAL_TABLET | Freq: Once | ORAL | Status: AC
  Administered 2014-05-30: 2 via ORAL
  Filled 2014-05-30: qty 2

## 2014-05-30 MED ORDER — MORPHINE SULFATE 4 MG/ML IJ SOLN
4.0000 mg | Freq: Once | INTRAMUSCULAR | Status: DC
  Filled 2014-05-30: qty 1

## 2014-05-30 MED ORDER — OXYCODONE-ACETAMINOPHEN 5-325 MG PO TABS: 1.0000 | ORAL_TABLET | Freq: Four times a day (QID) | ORAL | Status: DC | PRN

## 2014-05-30 NOTE — ED Provider Notes (Signed)
CSN: [id number]     Arrival date & time 05/30/14  1730 History   First MD Initiated Contact with Patient 05/30/14 1834     Chief Complaint  Patient presents with  . Abdominal Pain     (Consider location/radiation/quality/duration/timing/severity/associated sxs/prior Treatment) HPI [name] is a 69 year old female with past medical history of hypertension, hyperlipidemia, GERD, arthritis who presents the ER complaining of upper abdominal pain. Patient states her pain began gradually 3 days ago, and has since persisted. Patient states she initially tried taking laxatives with little to no relief. Patient reports consistent nausea past 3 days, however denies any vomiting. Patient also reports mild anorexia, stating she "has no appetite". Patient describes her pain as feeling "bloated". Patient also endorses some mild dysuria. Patient denies chest pain, shortness of breath, fever, melena, hematochezia, hematemesis.  Past Medical History  Diagnosis Date  . Hypertension   . Hyperlipidemia   . GERD (gastroesophageal reflux disease)   . Arthritis    Past Surgical History  Procedure Laterality Date  . Neck surgery    . Abdominal hysterectomy    . Replacement total knee bilateral    . Back surgery    . Thyroid surgery    . Anterior cervical decomp/discectomy fusion N/A 07/20/2012    Procedure: ANTERIOR CERVICAL DISCECTOMY FUSION C5-6, C6-7 with transgraft(Alphatec), local bone graft, plate and screws ;  Surgeon: Kerrin Champagne, MD;  Location: [facility] OR;  Service: Orthopedics;  Laterality: N/A;   Family History  Problem Relation Age of Onset  . Ovarian cancer Mother   . Heart disease Mother   . Prostate cancer Father   . Heart disease Father   . Colon cancer Father   . Breast cancer Sister   . Prostate cancer Brother   . Cystic fibrosis Sister   . Heart disease Sister   . Pancreatic cancer Neg Hx   . Rectal cancer Neg Hx   . Stomach cancer Neg Hx    History  Substance Use Topics  .  Smoking status: Never Smoker   . Smokeless tobacco: Never Used  . Alcohol Use: No   OB History    No data available     Review of Systems  Constitutional: Negative for fever.  HENT: Negative for trouble swallowing.   Eyes: Negative for visual disturbance.  Respiratory: Negative for shortness of breath.   Cardiovascular: Negative for chest pain.  Gastrointestinal: Positive for nausea, abdominal pain and constipation. Negative for vomiting and blood in stool.  Genitourinary: Positive for dysuria.  Musculoskeletal: Negative for neck pain.  Skin: Negative for rash.  Neurological: Negative for dizziness, weakness and numbness.  Psychiatric/Behavioral: Negative.        Allergies  Lisinopril; Penicillins; and Tramadol hcl  Home Medications   Prior to Admission medications   Medication Sig Start Date End Date Taking? Authorizing Provider  HYDROcodone-acetaminophen (NORCO/VICODIN) 5-325 MG per tablet Take 1 tablet by mouth every 6 (six) hours as needed for moderate pain.   Yes Historical Provider, MD  Multiple Vitamins-Minerals (MULTIVITAMINS THER. W/MINERALS) TABS Take 1 tablet by mouth daily.   Yes Historical Provider, MD  pantoprazole (PROTONIX) 40 MG tablet Take 40 mg by mouth daily.    Yes Historical Provider, MD  PRESCRIPTION MEDICATION Take 0.5 tablets by mouth daily. Blood pressure medication   Yes Historical Provider, MD  simvastatin (ZOCOR) 40 MG tablet Take 20 mg by mouth every evening.  daily   Yes Historical Provider, MD  losartan (COZAAR) 100 MG tablet Take 100  mg by mouth daily.  07/21/12 07/21/13  Historical Provider, MD  oxyCODONE-acetaminophen (PERCOCET) 5-325 MG per tablet Take 1-2 tablets by mouth every 6 (six) hours as needed. 05/30/14   Monte FantasiaJoseph W Attilio Zeitler, PA-C   BP 142/68 mmHg  Pulse 80  Temp(Src) 98.7 F (37.1 C) (Oral)  Resp 20  SpO2 96% Physical Exam  Constitutional: She is oriented to person, place, and time. She appears well-developed and well-nourished.  No distress.  HENT:  Head: Normocephalic and atraumatic.  Mouth/Throat: Oropharynx is clear and moist. No oropharyngeal exudate.  Eyes: Right eye exhibits no discharge. Left eye exhibits no discharge. No scleral icterus.  Neck: Normal range of motion.  Cardiovascular: Normal rate, regular rhythm and normal heart sounds.   No murmur heard. Pulmonary/Chest: Effort normal and breath sounds normal. No respiratory distress.  Abdominal: Soft. Normal appearance and bowel sounds are normal. There is generalized tenderness. There is no rigidity, no guarding, no tenderness at McBurney's point and negative Murphy's sign.  Diffuse tenderness more prominent in right upper, epigastric and left upper.  Musculoskeletal: Normal range of motion. She exhibits no edema or tenderness.  Neurological: She is alert and oriented to person, place, and time. She has normal strength. No cranial nerve deficit or sensory deficit. Coordination normal. GCS eye subscore is 4. GCS verbal subscore is 5. GCS motor subscore is 6.  Patient fully alert, answering questions appropriately in full, clear sentences. Cranial nerves II through XII grossly intact. Motor strength 5 out of 5 in all major muscle groups of upper and lower extremities. Distal sensation intact.  Skin: Skin is warm and dry. No rash noted. She is not diaphoretic.  Psychiatric: She has a normal mood and affect.  Nursing note and vitals reviewed.   ED Course  Procedures (including critical care time) Labs Review Labs Reviewed  CBC WITH DIFFERENTIAL/PLATELET - Abnormal; Notable for the following:    Neutrophils Relative % 84 (*)    Lymphocytes Relative 8 (*)    Lymphs Abs 0.6 (*)    All other components within normal limits  COMPREHENSIVE METABOLIC PANEL - Abnormal; Notable for the following:    Sodium 128 (*)    Chloride 93 (*)    Glucose, Bld 106 (*)    Creatinine, Ser 1.24 (*)    GFR calc non Af Amer 44 (*)    GFR calc Af Amer 51 (*)    All other  components within normal limits  LIPASE, BLOOD  URINALYSIS, ROUTINE W REFLEX MICROSCOPIC    Imaging Review Dg Abd Acute W/chest  05/30/2014   CLINICAL DATA:  Nausea, weakness for 3 days.  Abdominal pain.  EXAM: ACUTE ABDOMEN SERIES (ABDOMEN 2 VIEW & CHEST 1 VIEW)  COMPARISON:  Chest x-ray 04/29/2013  FINDINGS: The bowel gas pattern is normal. There is no evidence of free intraperitoneal air. No suspicious radio-opaque calculi or other significant radiographic abnormality is seen. Heart size and mediastinal contours are within normal limits. Both lungs are clear.  Postoperative changes in the lower lumbar spine. Degenerative changes throughout the thoracic and lumbar spine as well as hips. No acute bony abnormality.  IMPRESSION: No acute findings.  No acute cardiopulmonary disease.   Electronically Signed   By: Charlett NoseKevin  Dover M.D.   On: 05/30/2014 20:18     EKG Interpretation None      MDM   Final diagnoses:  Abdominal pain   Patient here complaining of generalized, diffuse abdominal pain. Patient reporting she has had this pain in the past,has  had workup at other institutions including CT abdomen pelvis. Patient afebrile, non-tachycardic, nontachypneic, non-hypoxic, well-appearing and in no acute distress. We'll treat symptomatically and follow-up with abdominal x-ray series to rule out of SBO.   Patient's labs unremarkable for any acute pathology. Radiographs reassuring.  Patient pain controlled, and we offered patient a CT abdomen pelvis to further workup or abdominal pain, however patient states she preferred to follow up with her primary care physician as she has had these studies in the past. Patient states of her PCP feels she needs the CT abdomen pelvis she can have it scheduled on Wednesday at her appointment with her PCP. Given that patient's pain is under control, and she is in no acute distress and hemodynamically stable, I believe this is a reasonable option at this point. We'll  discharge patient and have her follow-up with her PCP.  Patient is nontoxic, nonseptic appearing, in no apparent distress.  Patient's pain and other symptoms adequately managed in emergency department.  Fluid bolus given.  Labs, imaging and vitals reviewed.  Patient does not meet the SIRS or Sepsis criteria.  On repeat exam patient does not have a surgical abdomin and there are no peritoneal signs.  No indication of appendicitis, bowel obstruction, bowel perforation, cholecystitis, diverticulitis, PID or ectopic pregnancy.  Patient discharged home with symptomatic treatment and given strict instructions for follow-up with their primary care physician.  I have also discussed reasons to return immediately to the ER.  Patient expresses understanding and agrees with plan.  BP 142/68 mmHg  Pulse 80  Temp(Src) 98.7 F (37.1 C) (Oral)  Resp 20  SpO2 96%  Signed,  Ladona Mow, PA-C 1:19 AM  Patient seen and discussed with Dr. Rolan Bucco, MD       Monte Fantasia, PA-C 05/31/14 1914  Rolan Bucco, MD 06/03/14 [phone]

## 2014-05-30 NOTE — ED Notes (Signed)
Prepared to start IV, patient highly anxious about IV start. Requested one final attempt and then wanted to talk on phone first.

## 2014-05-30 NOTE — ED Notes (Signed)
Pt reports abdominal pain and soreness. Thinks she is constipation has been taking laxative. Had small BM today. Feels like she has gas and is bloated. Pt is a x 4

## 2014-05-30 NOTE — ED Notes (Signed)
Pt. Left with all belongings 

## 2014-05-30 NOTE — Discharge Instructions (Signed)
Follow-up with your primary care doctor. Return to the ER with any worsening of symptoms, severe abdominal pain, nausea, vomiting, high fever.  Abdominal Pain Many things can cause abdominal pain. Usually, abdominal pain is not caused by a disease and will improve without treatment. It can often be observed and treated at home. Your health care provider will do a physical exam and possibly order blood tests and X-rays to help determine the seriousness of your pain. However, in many cases, more time must pass before a clear cause of the pain can be found. Before that point, your health care provider may not know if you need more testing or further treatment. HOME CARE INSTRUCTIONS  Monitor your abdominal pain for any changes. The following actions may help to alleviate any discomfort you are experiencing:  Only take over-the-counter or prescription medicines as directed by your health care provider.  Do not take laxatives unless directed to do so by your health care provider.  Try a clear liquid diet (broth, tea, or water) as directed by your health care provider. Slowly move to a bland diet as tolerated. SEEK MEDICAL CARE IF:  You have unexplained abdominal pain.  You have abdominal pain associated with nausea or diarrhea.  You have pain when you urinate or have a bowel movement.  You experience abdominal pain that wakes you in the night.  You have abdominal pain that is worsened or improved by eating food.  You have abdominal pain that is worsened with eating fatty foods.  You have a fever. SEEK IMMEDIATE MEDICAL CARE IF:   Your pain does not go away within 2 hours.  You keep throwing up (vomiting).  Your pain is felt only in portions of the abdomen, such as the right side or the left lower portion of the abdomen.  You pass bloody or black tarry stools. MAKE SURE YOU:  Understand these instructions.   Will watch your condition.   Will get help right away if you are not  doing well or get worse.  Document Released: 01/23/2005 Document Revised: 04/20/2013 Document Reviewed: 12/23/2012 [facility] Patient Information 2015 [employer] Field[employer], [place]. This information is not intended to replace advice given to you by your health care provider. Make sure you discuss any questions you have with your health care provider.

## 2014-05-31 DIAGNOSIS — I10 Essential (primary) hypertension: Secondary | ICD-10-CM | POA: Diagnosis not present

## 2014-05-31 DIAGNOSIS — E871 Hypo-osmolality and hyponatremia: Secondary | ICD-10-CM | POA: Diagnosis not present

## 2014-05-31 DIAGNOSIS — R1110 Vomiting, unspecified: Secondary | ICD-10-CM | POA: Diagnosis not present

## 2014-05-31 DIAGNOSIS — R100 Acute abdomen: Secondary | ICD-10-CM | POA: Diagnosis not present

## 2014-06-02 DIAGNOSIS — S139XXD Sprain of joints and ligaments of unspecified parts of neck, subsequent encounter: Secondary | ICD-10-CM | POA: Diagnosis not present

## 2014-06-02 DIAGNOSIS — S335XXD Sprain of ligaments of lumbar spine, subsequent encounter: Secondary | ICD-10-CM | POA: Diagnosis not present

## 2014-06-02 DIAGNOSIS — S239XXD Sprain of unspecified parts of thorax, subsequent encounter: Secondary | ICD-10-CM | POA: Diagnosis not present

## 2014-06-17 DIAGNOSIS — Z5181 Encounter for therapeutic drug level monitoring: Secondary | ICD-10-CM | POA: Diagnosis not present

## 2014-06-17 DIAGNOSIS — J0110 Acute frontal sinusitis, unspecified: Secondary | ICD-10-CM | POA: Diagnosis not present

## 2014-06-17 DIAGNOSIS — E871 Hypo-osmolality and hyponatremia: Secondary | ICD-10-CM | POA: Diagnosis not present

## 2014-06-17 DIAGNOSIS — I10 Essential (primary) hypertension: Secondary | ICD-10-CM | POA: Diagnosis not present

## 2014-06-17 DIAGNOSIS — R1110 Vomiting, unspecified: Secondary | ICD-10-CM | POA: Diagnosis not present

## 2014-06-21 ENCOUNTER — Ambulatory Visit: Payer: No Typology Code available for payment source | Attending: Specialist | Admitting: Physical Therapy

## 2014-06-21 DIAGNOSIS — M549 Dorsalgia, unspecified: Secondary | ICD-10-CM | POA: Insufficient documentation

## 2014-06-21 DIAGNOSIS — M6248 Contracture of muscle, other site: Secondary | ICD-10-CM | POA: Insufficient documentation

## 2014-06-21 DIAGNOSIS — M542 Cervicalgia: Secondary | ICD-10-CM | POA: Insufficient documentation

## 2014-07-12 DIAGNOSIS — S335XXD Sprain of ligaments of lumbar spine, subsequent encounter: Secondary | ICD-10-CM | POA: Diagnosis not present

## 2014-07-12 DIAGNOSIS — S239XXD Sprain of unspecified parts of thorax, subsequent encounter: Secondary | ICD-10-CM | POA: Diagnosis not present

## 2014-07-12 DIAGNOSIS — S139XXD Sprain of joints and ligaments of unspecified parts of neck, subsequent encounter: Secondary | ICD-10-CM | POA: Diagnosis not present

## 2014-09-28 ENCOUNTER — Other Ambulatory Visit: Payer: Self-pay

## 2014-09-28 DIAGNOSIS — Z1231 Encounter for screening mammogram for malignant neoplasm of breast: Secondary | ICD-10-CM

## 2014-10-05 ENCOUNTER — Ambulatory Visit: Payer: Medicare Other

## 2014-10-19 ENCOUNTER — Encounter: Payer: Self-pay | Admitting: Gastroenterology

## 2014-10-21 ENCOUNTER — Ambulatory Visit
Admission: RE | Admit: 2014-10-21 | Discharge: 2014-10-21 | Disposition: A | Discharge status: Home / Self Care | Payer: Commercial Managed Care - HMO | Source: Ambulatory Visit

## 2014-10-21 DIAGNOSIS — Z1231 Encounter for screening mammogram for malignant neoplasm of breast: Secondary | ICD-10-CM | POA: Diagnosis not present

## 2014-12-22 DIAGNOSIS — M25552 Pain in left hip: Secondary | ICD-10-CM | POA: Diagnosis not present

## 2015-02-28 DIAGNOSIS — K862 Cyst of pancreas: Secondary | ICD-10-CM

## 2015-02-28 HISTORY — DX: Cyst of pancreas: K86.2

## 2015-03-02 ENCOUNTER — Emergency Department (HOSPITAL_COMMUNITY): Payer: Commercial Managed Care - HMO

## 2015-03-02 ENCOUNTER — Encounter (HOSPITAL_COMMUNITY): Payer: Self-pay | Admitting: Emergency Medicine

## 2015-03-02 ENCOUNTER — Inpatient Hospital Stay (HOSPITAL_COMMUNITY)
Admission: EM | Admit: 2015-03-02 | Discharge: 2015-03-09 | DRG: 312 | Disposition: A | Discharge status: Home / Self Care | Payer: Commercial Managed Care - HMO | Attending: Internal Medicine | Admitting: Internal Medicine

## 2015-03-02 ENCOUNTER — Observation Stay (HOSPITAL_COMMUNITY): Payer: Commercial Managed Care - HMO

## 2015-03-02 DIAGNOSIS — Z79899 Other long term (current) drug therapy: Secondary | ICD-10-CM

## 2015-03-02 DIAGNOSIS — Z800 Family history of malignant neoplasm of digestive organs: Secondary | ICD-10-CM

## 2015-03-02 DIAGNOSIS — R42 Dizziness and giddiness: Secondary | ICD-10-CM | POA: Diagnosis not present

## 2015-03-02 DIAGNOSIS — Z8249 Family history of ischemic heart disease and other diseases of the circulatory system: Secondary | ICD-10-CM

## 2015-03-02 DIAGNOSIS — E876 Hypokalemia: Secondary | ICD-10-CM | POA: Diagnosis not present

## 2015-03-02 DIAGNOSIS — Z79891 Long term (current) use of opiate analgesic: Secondary | ICD-10-CM

## 2015-03-02 DIAGNOSIS — Z880 Allergy status to penicillin: Secondary | ICD-10-CM

## 2015-03-02 DIAGNOSIS — Z803 Family history of malignant neoplasm of breast: Secondary | ICD-10-CM

## 2015-03-02 DIAGNOSIS — Z6841 Body Mass Index (BMI) 40.0 and over, adult: Secondary | ICD-10-CM | POA: Diagnosis not present

## 2015-03-02 DIAGNOSIS — R404 Transient alteration of awareness: Secondary | ICD-10-CM | POA: Diagnosis not present

## 2015-03-02 DIAGNOSIS — Z981 Arthrodesis status: Secondary | ICD-10-CM | POA: Diagnosis not present

## 2015-03-02 DIAGNOSIS — R1110 Vomiting, unspecified: Secondary | ICD-10-CM

## 2015-03-02 DIAGNOSIS — K219 Gastro-esophageal reflux disease without esophagitis: Secondary | ICD-10-CM | POA: Diagnosis present

## 2015-03-02 DIAGNOSIS — K862 Cyst of pancreas: Secondary | ICD-10-CM | POA: Diagnosis not present

## 2015-03-02 DIAGNOSIS — Z8041 Family history of malignant neoplasm of ovary: Secondary | ICD-10-CM | POA: Diagnosis not present

## 2015-03-02 DIAGNOSIS — K449 Diaphragmatic hernia without obstruction or gangrene: Secondary | ICD-10-CM | POA: Diagnosis present

## 2015-03-02 DIAGNOSIS — R531 Weakness: Secondary | ICD-10-CM | POA: Diagnosis not present

## 2015-03-02 DIAGNOSIS — I5189 Other ill-defined heart diseases: Secondary | ICD-10-CM | POA: Diagnosis present

## 2015-03-02 DIAGNOSIS — E860 Dehydration: Secondary | ICD-10-CM | POA: Diagnosis present

## 2015-03-02 DIAGNOSIS — Z888 Allergy status to other drugs, medicaments and biological substances status: Secondary | ICD-10-CM | POA: Diagnosis not present

## 2015-03-02 DIAGNOSIS — R112 Nausea with vomiting, unspecified: Secondary | ICD-10-CM | POA: Diagnosis not present

## 2015-03-02 DIAGNOSIS — K760 Fatty (change of) liver, not elsewhere classified: Secondary | ICD-10-CM | POA: Diagnosis not present

## 2015-03-02 DIAGNOSIS — R1013 Epigastric pain: Secondary | ICD-10-CM | POA: Diagnosis not present

## 2015-03-02 DIAGNOSIS — G43A1 Cyclical vomiting, intractable: Secondary | ICD-10-CM | POA: Diagnosis not present

## 2015-03-02 DIAGNOSIS — E785 Hyperlipidemia, unspecified: Secondary | ICD-10-CM | POA: Diagnosis present

## 2015-03-02 DIAGNOSIS — R110 Nausea: Secondary | ICD-10-CM | POA: Diagnosis not present

## 2015-03-02 DIAGNOSIS — R001 Bradycardia, unspecified: Secondary | ICD-10-CM | POA: Diagnosis not present

## 2015-03-02 DIAGNOSIS — E669 Obesity, unspecified: Secondary | ICD-10-CM | POA: Diagnosis present

## 2015-03-02 DIAGNOSIS — M1990 Unspecified osteoarthritis, unspecified site: Secondary | ICD-10-CM | POA: Diagnosis present

## 2015-03-02 DIAGNOSIS — R55 Syncope and collapse: Principal | ICD-10-CM | POA: Diagnosis present

## 2015-03-02 DIAGNOSIS — R1084 Generalized abdominal pain: Secondary | ICD-10-CM | POA: Diagnosis not present

## 2015-03-02 DIAGNOSIS — Z885 Allergy status to narcotic agent status: Secondary | ICD-10-CM

## 2015-03-02 DIAGNOSIS — K8689 Other specified diseases of pancreas: Secondary | ICD-10-CM

## 2015-03-02 DIAGNOSIS — I10 Essential (primary) hypertension: Secondary | ICD-10-CM | POA: Diagnosis present

## 2015-03-02 DIAGNOSIS — G43A0 Cyclical vomiting, not intractable: Secondary | ICD-10-CM | POA: Diagnosis not present

## 2015-03-02 HISTORY — DX: Cyst of pancreas: K86.2

## 2015-03-02 HISTORY — DX: Other ill-defined heart diseases: I51.89

## 2015-03-02 LAB — URINALYSIS, ROUTINE W REFLEX MICROSCOPIC
Bilirubin Urine: NEGATIVE
Glucose, UA: NEGATIVE mg/dL
Hgb urine dipstick: NEGATIVE
Ketones, ur: NEGATIVE mg/dL
LEUKOCYTES UA: NEGATIVE
NITRITE: NEGATIVE
Protein, ur: NEGATIVE mg/dL
SPECIFIC GRAVITY, URINE: 1.007 (ref 1.005–1.030)
UROBILINOGEN UA: 0.2 mg/dL (ref 0.0–1.0)
pH: 6.5 (ref 5.0–8.0)

## 2015-03-02 LAB — CBC
HEMATOCRIT: 38 % (ref 36.0–46.0)
HEMOGLOBIN: 12.2 g/dL (ref 12.0–15.0)
MCH: 26 pg (ref 26.0–34.0)
MCHC: 32.1 g/dL (ref 30.0–36.0)
MCV: 80.9 fL (ref 78.0–100.0)
Platelets: 196 10*3/uL (ref 150–400)
RBC: 4.7 MIL/uL (ref 3.87–5.11)
RDW: 15.1 % (ref 11.5–15.5)
WBC: 3.4 10*3/uL — AB (ref 4.0–10.5)

## 2015-03-02 LAB — BASIC METABOLIC PANEL
Anion gap: 7 (ref 5–15)
BUN: 14 mg/dL (ref 6–20)
CO2: 28 mmol/L (ref 22–32)
Calcium: 9 mg/dL (ref 8.9–10.3)
Chloride: 103 mmol/L (ref 101–111)
Creatinine, Ser: 1.04 mg/dL — ABNORMAL HIGH (ref 0.44–1.00)
GFR calc Af Amer: >60 mL/min (ref >60)
GFR calc non Af Amer: 54 mL/min — ABNORMAL LOW (ref >60)
Glucose, Bld: 127 mg/dL — ABNORMAL HIGH (ref 65–99)
Potassium: 3.6 mmol/L (ref 3.5–5.1)
Sodium: 138 mmol/L (ref 135–145)

## 2015-03-02 LAB — HEPATIC FUNCTION PANEL
ALBUMIN: 3.9 g/dL (ref 3.5–5.0)
ALK PHOS: 57 U/L (ref 38–126)
ALT: 8 U/L — AB (ref 14–54)
AST: 17 U/L (ref 15–41)
Bilirubin, Direct: <0.1 mg/dL — ABNORMAL LOW (ref 0.1–0.5)
TOTAL PROTEIN: 7.1 g/dL (ref 6.5–8.1)
Total Bilirubin: 0.6 mg/dL (ref 0.3–1.2)

## 2015-03-02 LAB — TROPONIN I

## 2015-03-02 LAB — LIPASE, BLOOD: Lipase: 54 U/L — ABNORMAL HIGH (ref 11–51)

## 2015-03-02 LAB — TSH: TSH: 2.222 u[IU]/mL (ref 0.350–4.500)

## 2015-03-02 LAB — CBG MONITORING, ED: Glucose-Capillary: 124 mg/dL — ABNORMAL HIGH (ref 65–99)

## 2015-03-02 MED ORDER — SODIUM CHLORIDE 0.9 % IV SOLN: INTRAVENOUS | Status: DC

## 2015-03-02 MED ORDER — INFLUENZA VAC SPLIT QUAD 0.5 ML IM SUSY
0.5000 mL | PREFILLED_SYRINGE | INTRAMUSCULAR | Status: AC
  Administered 2015-03-03: 0.5 mL via INTRAMUSCULAR
  Filled 2015-03-02 (×2): qty 0.5

## 2015-03-02 MED ORDER — LOSARTAN POTASSIUM 50 MG PO TABS
100.0000 mg | ORAL_TABLET | Freq: Every day | ORAL | Status: DC
Start: 2015-03-02 — End: 2015-03-09
  Administered 2015-03-02 – 2015-03-09 (×8): 100 mg via ORAL
  Filled 2015-03-02 (×8): qty 2

## 2015-03-02 MED ORDER — SODIUM CHLORIDE 0.9 % IJ SOLN: 3.0000 mL | Freq: Two times a day (BID) | INTRAMUSCULAR | Status: DC

## 2015-03-02 MED ORDER — ACETAMINOPHEN 500 MG PO TABS
1000.0000 mg | ORAL_TABLET | Freq: Once | ORAL | Status: AC
  Administered 2015-03-02: 1000 mg via ORAL
  Filled 2015-03-02: qty 2

## 2015-03-02 MED ORDER — ASPIRIN 325 MG PO TABS
325.0000 mg | ORAL_TABLET | Freq: Once | ORAL | Status: AC
Start: 2015-03-02 — End: 2015-03-02
  Administered 2015-03-02: 325 mg via ORAL
  Filled 2015-03-02: qty 1

## 2015-03-02 MED ORDER — OXYCODONE-ACETAMINOPHEN 5-325 MG PO TABS
1.0000 | ORAL_TABLET | Freq: Four times a day (QID) | ORAL | Status: DC | PRN
Start: 2015-03-02 — End: 2015-03-07
  Administered 2015-03-02 – 2015-03-06 (×6): 1 via ORAL
  Filled 2015-03-02 (×6): qty 1

## 2015-03-02 MED ORDER — ONDANSETRON HCL 4 MG PO TABS: 4.0000 mg | ORAL_TABLET | Freq: Four times a day (QID) | ORAL | Status: DC | PRN

## 2015-03-02 MED ORDER — SIMVASTATIN 20 MG PO TABS
20.0000 mg | ORAL_TABLET | Freq: Every evening | ORAL | Status: DC
  Administered 2015-03-04 – 2015-03-08 (×5): 20 mg via ORAL
  Filled 2015-03-02 (×6): qty 1

## 2015-03-02 MED ORDER — TRIAMTERENE-HCTZ 37.5-25 MG PO TABS
1.0000 | ORAL_TABLET | Freq: Every day | ORAL | Status: DC
  Administered 2015-03-02 – 2015-03-07 (×6): 1 via ORAL
  Filled 2015-03-02 (×7): qty 1

## 2015-03-02 MED ORDER — BOOST / RESOURCE BREEZE PO LIQD
1.0000 | Freq: Three times a day (TID) | ORAL | Status: DC
  Administered 2015-03-03 – 2015-03-06 (×7): 1 via ORAL

## 2015-03-02 MED ORDER — ENOXAPARIN SODIUM 60 MG/0.6ML ~~LOC~~ SOLN
60.0000 mg | SUBCUTANEOUS | Status: DC
  Administered 2015-03-02 – 2015-03-08 (×7): 60 mg via SUBCUTANEOUS
  Filled 2015-03-02 (×7): qty 0.6

## 2015-03-02 MED ORDER — ONDANSETRON HCL 4 MG/2ML IJ SOLN
4.0000 mg | Freq: Four times a day (QID) | INTRAMUSCULAR | Status: DC | PRN
  Administered 2015-03-03 – 2015-03-05 (×7): 4 mg via INTRAVENOUS
  Filled 2015-03-02 (×7): qty 2

## 2015-03-02 MED ORDER — SODIUM CHLORIDE 0.9 % IV SOLN
INTRAVENOUS | Status: DC
  Administered 2015-03-02 – 2015-03-07 (×11): via INTRAVENOUS
  Administered 2015-03-07: 1000 mL via INTRAVENOUS

## 2015-03-02 MED ORDER — ONDANSETRON HCL 4 MG/2ML IJ SOLN
4.0000 mg | Freq: Once | INTRAMUSCULAR | Status: AC
  Administered 2015-03-02: 4 mg via INTRAVENOUS
  Filled 2015-03-02: qty 2

## 2015-03-02 MED ORDER — PANTOPRAZOLE SODIUM 40 MG PO TBEC
40.0000 mg | DELAYED_RELEASE_TABLET | Freq: Every day | ORAL | Status: DC
  Administered 2015-03-03 – 2015-03-08 (×6): 40 mg via ORAL
  Filled 2015-03-02 (×6): qty 1

## 2015-03-02 NOTE — H&P (Signed)
PCP:   Gwynneth Aliment, MD   Chief Complaint:  [name] out  HPI:  69 year old female who  has a past medical history of Hypertension; Hyperlipidemia; GERD (gastroesophageal reflux disease); and Arthritis. Today presents to the hospital after patient had episode of syncope. As per patient she has been feeling dizzy especially on eating up from sitting position for past few days. She also has been having episodes of vomiting whenever she eats something. She ate this morning after that she started throwing up then patient went to her house and when she got out of the car and started to walk to her home as per patient's friend she passed out. Patient did not hit her head or face has friend was able to break the fall. Patient did not respond for a few minutes with eyes rolled back. After patient's son came out when patient began to come around. There was no witnessed seizure-like activity, patient denies chest pain or shortness of breath. No headache or blurred vision. Denies dysuria urgency or frequency of urination. In the ED lab work did not show significant abnormality. EKG showed bradycardia with prolonged PR interval 144 ms  Allergies:   Allergies  Allergen Reactions  . Lisinopril Swelling    Swelling in mouth and throat  . Penicillins Rash    Has patient had a PCN reaction causing immediate rash, facial/tongue/throat swelling, SOB or lightheadedness with hypotension: No Has patient had a PCN reaction causing severe rash involving mucus membranes or skin necrosis: No Has patient had a PCN reaction that required hospitalization No Has patient had a PCN reaction occurring within the last 10 years: No If all of the above answers are "NO", then may proceed with Cephalosporin use.   . Tramadol Hcl Rash      Past Medical History  Diagnosis Date  . Hypertension   . Hyperlipidemia   . GERD (gastroesophageal reflux disease)   . Arthritis     Past Surgical History  Procedure  Laterality Date  . Neck surgery    . Abdominal hysterectomy    . Replacement total knee bilateral    . Back surgery    . Thyroid surgery    . Anterior cervical decomp/discectomy fusion N/A 07/20/2012    Procedure: ANTERIOR CERVICAL DISCECTOMY FUSION C5-6, C6-7 with transgraft(Alphatec), local bone graft, plate and screws ;  Surgeon: Kerrin Champagne, MD;  Location: [facility] OR;  Service: Orthopedics;  Laterality: N/A;    Prior to Admission medications   Medication Sig Start Date End Date Taking? Authorizing Provider  losartan (COZAAR) 100 MG tablet Take 100 mg by mouth daily.  07/21/12 03/02/15 Yes Historical Provider, MD  Multiple Vitamins-Minerals (MULTIVITAMINS THER. W/MINERALS) TABS Take 1 tablet by mouth daily.   Yes Historical Provider, MD  pantoprazole (PROTONIX) 40 MG tablet Take 40 mg by mouth daily.    Yes Historical Provider, MD  simvastatin (ZOCOR) 20 MG tablet Take 20 mg by mouth every evening. 02/08/15  Yes Historical Provider, MD  triamterene-hydrochlorothiazide (MAXZIDE-25) 37.5-25 MG tablet Take 1 tablet by mouth daily. 02/17/15  Yes Historical Provider, MD  oxyCODONE-acetaminophen (PERCOCET) 5-325 MG per tablet Take 1-2 tablets by mouth every 6 (six) hours as needed. 05/30/14   Ladona Mow, PA-C    Social History:  reports that she has never smoked. She has never used smokeless tobacco. She reports that she does not drink alcohol or use illicit drugs.  Family History  Problem Relation Age of Onset  . Ovarian cancer Mother   .  Heart disease Mother   . Prostate cancer Father   . Heart disease Father   . Colon cancer Father   . Breast cancer Sister   . Prostate cancer Brother   . Cystic fibrosis Sister   . Heart disease Sister   . Pancreatic cancer Neg Hx   . Rectal cancer Neg Hx   . Stomach cancer Neg Hx     Filed Weights   03/02/15 1347  Weight: 107.956 kg (238 lb)    All the positives are listed in BOLD  Review of Systems:  HEENT: Headache, blurred vision, runny nose,  sore throat Neck: Hypothyroidism, hyperthyroidism,,lymphadenopathy Chest : Shortness of breath, history of COPD, Asthma Heart : Chest pain, history of coronary arterey disease GI:  Nausea, vomiting, diarrhea, constipation, GERD GU: Dysuria, urgency, frequency of urination, hematuria Neuro: Stroke, seizures, syncope Psych: Depression, anxiety, hallucinations   Physical Exam: Blood pressure 127/60, pulse 55, temperature 97.8 F (36.6 C), temperature source Oral, resp. rate 14, height  (1.626 m), weight 107.956 kg (238 lb), SpO2 96 %. Constitutional:   Patient is a well-developed and well-nourished *female in no acute distress and cooperative with exam. Head: Normocephalic and atraumatic Mouth: Mucus membranes moist Eyes: PERRL, EOMI, conjunctivae normal Neck: Supple, No Thyromegaly Cardiovascular: RRR, S1 normal, S2 normal Pulmonary/Chest: CTAB, no wheezes, rales, or rhonchi Abdominal: Soft. Non-tender, non-distended, bowel sounds are normal, no masses, organomegaly, or guarding present.  Neurological: A&O x3, Strength is normal and symmetric bilaterally, cranial nerve II-XII are grossly intact, no focal motor deficit, sensory intact to light touch bilaterally.  Extremities : No Cyanosis, Clubbing or Edema  Labs on Admission:  Basic Metabolic Panel:  Recent Labs Lab 03/02/15 1409  NA 138  K 3.6  CL 103  CO2 28  GLUCOSE 127*  BUN 14  CREATININE 1.04*  CALCIUM 9.0   Liver Function Tests:  Recent Labs Lab 03/02/15 1409  AST 17  ALT 8*  ALKPHOS 57  BILITOT 0.6  PROT 7.1  ALBUMIN 3.9    Recent Labs Lab 03/02/15 1409  LIPASE 54*   No results for input(s): AMMONIA in the last 168 hours. CBC:  Recent Labs Lab 03/02/15 1409  WBC 3.4*  HGB 12.2  HCT 38.0  MCV 80.9  PLT 196   Cardiac Enzymes:  Recent Labs Lab 03/02/15 1409  TROPONINI <0.03     CBG:  Recent Labs Lab 03/02/15 1355  GLUCAP 124*    Radiological Exams on Admission: Ct Head Wo  Contrast  03/02/2015  CLINICAL DATA:  Dizziness. EXAM: CT HEAD WITHOUT CONTRAST TECHNIQUE: Contiguous axial images were obtained from the base of the skull through the vertex without intravenous contrast. COMPARISON:  04/29/2013 FINDINGS: There is prominence of the ventricles, similar to previous exam. Low attenuation throughout the subcortical and periventricular white matter is noted compatible with chronic microvascular disease. There is no evidence for acute cortical infarct, intracranial hemorrhage or mass. The paranasal sinuses and mastoid air cells are clear. The calvarium appears intact. IMPRESSION: 1. No acute intracranial abnormalities. 2. Chronic microvascular disease and brain atrophy. Electronically Signed   By: Signa Kell M.D.   On: 03/02/2015 14:40    EKG: Independently reviewed. normal sinus rhythm, bradycardia, prolonged PR interval    Assessment/Plan Active Problems:   Essential hypertension   Syncope and collapse   Syncope  Syncope Unclear cause, patient takes Triamterene/Hctz for hypertension. Also patient has been vomiting intermittently over the past few days. Cardiac enzymes are negative so far. EKG shows  mild bradycardia with prolonged PR interval. Will check orthostatic vital signs every shift Check cardiac enzymes every 6 hours 3 Obtain echocardiogram Monitor closely on telemetry IV fluids normal saline for dehydration  If all above negative, consider Holter monitor versus event monitor as outpatient.  Hypertension Continue Cozaar, Maxide 25  Nausea and vomiting ? Cause, we'll start clear liquid diet, Zofran when necessary for nausea vomiting Patient's hemoglobin 12.2 is at baseline.  Her previous hemoglobin from February 2016 was 13.2    Code status: Full  code   Family discussion: Admission, patients condition and plan of care including tests being ordered have been discussed with the patient and Her friend at bedside* who indicate understanding and  agree with the plan and Code Status.   Time Spent on Admission: 60 min  LAMA,GAGAN S [facility] Pager: [phone] 03/02/2015, 5:10 PM  If 7PM-7AM, please contact night-coverage  [email or web address]  Password [email or web address]

## 2015-03-02 NOTE — ED Notes (Signed)
MD at bedside. 

## 2015-03-02 NOTE — ED Notes (Signed)
Attempted orthostatic vital signs pt could not tolerate sitting up or moving around Rn notified

## 2015-03-02 NOTE — ED Notes (Signed)
Per EMS, patient is from home.  Patient complains of n/v, and dizziness.  Patient states she is weak and loss of appetite.  Patient denies hitting head, LOC, and any pain.  Per EMS, patient was negative on stroke screen.  BP: 137/109 P: 68 NSR O2: 96% room air  CBG: 126

## 2015-03-02 NOTE — ED Notes (Signed)
Bed: [id number] Expected date:  Expected time:  Means of arrival:  Comments: EMS/ DIZZINESS

## 2015-03-02 NOTE — ED Notes (Signed)
Pt can go at 17:20

## 2015-03-02 NOTE — ED Notes (Signed)
Pt is aware of urine sample will attempt again

## 2015-03-02 NOTE — ED Provider Notes (Signed)
CSN: [id number]     Arrival date & time 03/02/15  1300 History   First MD Initiated Contact with Patient 03/02/15 1351     Chief Complaint  Patient presents with  . Nausea  . Near Syncope  . Dizziness     (Consider location/radiation/quality/duration/timing/severity/associated sxs/prior Treatment) HPI Patient had a syncopal episode and a near-syncopal episode prior to arrival. She went with her sister to do some errands. She reports she got out of her sister's vehicle and felt lightheaded and like things were going black so she decided not to do her errands and got back into the car. She told her sister to take her home. Shortly thereafter she reports she got extremely nauseated and sweaty and had to have her stop the car so that she could vomit. Her sister then proceeded to her house, when the patient got out of the car and started to walk to her home her friend was there and she says she looked really unwell and passed out. Her friend was able to break her fall so she did not hit her face or head. She reports that for a moment her friend didn't think she was breathing. She reports her eyes were rolled back up in her head and she wasn't responding. The patient's son came out and the patient then began to come around. Within about 5 minutes the patient was aware of her surroundings. She denies she specifically had chest pain with this episode. She reports she did not feel well and and still feels rather weak. Past Medical History  Diagnosis Date  . Hypertension   . Hyperlipidemia   . GERD (gastroesophageal reflux disease)   . Arthritis    Past Surgical History  Procedure Laterality Date  . Neck surgery    . Abdominal hysterectomy    . Replacement total knee bilateral    . Back surgery    . Thyroid surgery    . Anterior cervical decomp/discectomy fusion N/A 07/20/2012    Procedure: ANTERIOR CERVICAL DISCECTOMY FUSION C5-6, C6-7 with transgraft(Alphatec), local bone graft, plate and screws  ;  Surgeon: Kerrin Champagne, MD;  Location: [facility] OR;  Service: Orthopedics;  Laterality: N/A;   Family History  Problem Relation Age of Onset  . Ovarian cancer Mother   . Heart disease Mother   . Prostate cancer Father   . Heart disease Father   . Colon cancer Father   . Breast cancer Sister   . Prostate cancer Brother   . Cystic fibrosis Sister   . Heart disease Sister   . Pancreatic cancer Neg Hx   . Rectal cancer Neg Hx   . Stomach cancer Neg Hx    Social History  Substance Use Topics  . Smoking status: Never Smoker   . Smokeless tobacco: Never Used  . Alcohol Use: No   OB History    No data available     Review of Systems 10 Systems reviewed and are negative for acute change except as noted in the HPI.    Allergies  Lisinopril; Penicillins; and Tramadol hcl  Home Medications   Prior to Admission medications   Medication Sig Start Date End Date Taking? Authorizing Provider  losartan (COZAAR) 100 MG tablet Take 100 mg by mouth daily.  07/21/12 03/02/15 Yes Historical Provider, MD  Multiple Vitamins-Minerals (MULTIVITAMINS THER. W/MINERALS) TABS Take 1 tablet by mouth daily.   Yes Historical Provider, MD  pantoprazole (PROTONIX) 40 MG tablet Take 40 mg by mouth daily.  Yes Historical Provider, MD  simvastatin (ZOCOR) 20 MG tablet Take 20 mg by mouth every evening. 02/08/15  Yes Historical Provider, MD  triamterene-hydrochlorothiazide (MAXZIDE-25) 37.5-25 MG tablet Take 1 tablet by mouth daily. 02/17/15  Yes Historical Provider, MD  oxyCODONE-acetaminophen (PERCOCET) 5-325 MG per tablet Take 1-2 tablets by mouth every 6 (six) hours as needed. 05/30/14   Ladona Mow, PA-C   BP 127/60 mmHg  Pulse 55  Temp(Src) 97.8 F (36.6 C) (Oral)  Resp 14  Ht  (1.626 m)  Wt 238 lb (107.956 kg)  BMI 40.83 kg/m2  SpO2 96% Physical Exam  Constitutional: She is oriented to person, place, and time. She appears well-developed and well-nourished.  HENT:  Head: Normocephalic and  atraumatic.  Eyes: EOM are normal. Pupils are equal, round, and reactive to light.  Neck: Neck supple.  Cardiovascular: Normal rate, regular rhythm, normal heart sounds and intact distal pulses.   Pulmonary/Chest: Effort normal and breath sounds normal.  Abdominal: Soft. Bowel sounds are normal. She exhibits no distension. There is no tenderness.  Musculoskeletal: Normal range of motion. She exhibits no edema or tenderness.  Neurological: She is alert and oriented to person, place, and time. She has normal strength. No cranial nerve deficit. She exhibits normal muscle tone. Coordination normal. GCS eye subscore is 4. GCS verbal subscore is 5. GCS motor subscore is 6.  No nystagmus. Patient does endorse sensation of dizziness with sitting forward in the stretcher.  Skin: Skin is warm, dry and intact.  Psychiatric: She has a normal mood and affect.    ED Course  Procedures (including critical care time) Labs Review Labs Reviewed  BASIC METABOLIC PANEL - Abnormal; Notable for the following:    Glucose, Bld 127 (*)    Creatinine, Ser 1.04 (*)    GFR calc non Af Amer 54 (*)    All other components within normal limits  CBC - Abnormal; Notable for the following:    WBC 3.4 (*)    All other components within normal limits  LIPASE, BLOOD - Abnormal; Notable for the following:    Lipase 54 (*)    All other components within normal limits  HEPATIC FUNCTION PANEL - Abnormal; Notable for the following:    ALT 8 (*)    Bilirubin, Direct <0.1 (*)    All other components within normal limits  CBG MONITORING, ED - Abnormal; Notable for the following:    Glucose-Capillary 124 (*)    All other components within normal limits  URINALYSIS, ROUTINE W REFLEX MICROSCOPIC (NOT AT [facility])  TROPONIN I    Imaging Review Ct Head Wo Contrast  03/02/2015  CLINICAL DATA:  Dizziness. EXAM: CT HEAD WITHOUT CONTRAST TECHNIQUE: Contiguous axial images were obtained from the base of the skull through the vertex  without intravenous contrast. COMPARISON:  04/29/2013 FINDINGS: There is prominence of the ventricles, similar to previous exam. Low attenuation throughout the subcortical and periventricular white matter is noted compatible with chronic microvascular disease. There is no evidence for acute cortical infarct, intracranial hemorrhage or mass. The paranasal sinuses and mastoid air cells are clear. The calvarium appears intact. IMPRESSION: 1. No acute intracranial abnormalities. 2. Chronic microvascular disease and brain atrophy. Electronically Signed   By: Signa Kell M.D.   On: 03/02/2015 14:40   I have personally reviewed and evaluated these images and lab results as part of my medical decision-making.   EKG Interpretation   Date/Time:  Thursday March 02 2015 13:16:56 EDT Ventricular Rate:  54  PR Interval:  144 QRS Duration: 90 QT Interval:  440 QTC Calculation: 417 R Axis:   -27 Text Interpretation:  Sinus rhythm Inferior infarct, old no change from  previous Confirmed by Donnald GarrePfeiffer, MD, Lebron ConnersMarcy [phone]([id number]) on 03/02/2015 3:38:59 PM      MDM   Final diagnoses:  Syncope and collapse   Patient had an episode today of a near syncope and a syncopal episode. At this time she does not show specific volume depletion. She is not having active chest pain. The patient will be admitted for ongoing monitoring and diagnostic evaluation for syncopal episode. There does not appear to be any associated trauma. The patient has a CT head that is negative for acute findings. She has a nonfocal neurologic examination.    Arby BarretteMarcy Salina Stanfield, MD 03/02/15 [phone]

## 2015-03-03 ENCOUNTER — Observation Stay (HOSPITAL_BASED_OUTPATIENT_CLINIC_OR_DEPARTMENT_OTHER): Payer: Commercial Managed Care - HMO

## 2015-03-03 DIAGNOSIS — M1990 Unspecified osteoarthritis, unspecified site: Secondary | ICD-10-CM | POA: Diagnosis not present

## 2015-03-03 DIAGNOSIS — R55 Syncope and collapse: Secondary | ICD-10-CM

## 2015-03-03 DIAGNOSIS — Z6841 Body Mass Index (BMI) 40.0 and over, adult: Secondary | ICD-10-CM | POA: Diagnosis not present

## 2015-03-03 DIAGNOSIS — R001 Bradycardia, unspecified: Secondary | ICD-10-CM | POA: Diagnosis not present

## 2015-03-03 DIAGNOSIS — Z8249 Family history of ischemic heart disease and other diseases of the circulatory system: Secondary | ICD-10-CM | POA: Diagnosis not present

## 2015-03-03 DIAGNOSIS — E785 Hyperlipidemia, unspecified: Secondary | ICD-10-CM | POA: Diagnosis not present

## 2015-03-03 DIAGNOSIS — Z8041 Family history of malignant neoplasm of ovary: Secondary | ICD-10-CM | POA: Diagnosis not present

## 2015-03-03 DIAGNOSIS — Z803 Family history of malignant neoplasm of breast: Secondary | ICD-10-CM | POA: Diagnosis not present

## 2015-03-03 DIAGNOSIS — R112 Nausea with vomiting, unspecified: Secondary | ICD-10-CM | POA: Diagnosis present

## 2015-03-03 DIAGNOSIS — Z880 Allergy status to penicillin: Secondary | ICD-10-CM | POA: Diagnosis not present

## 2015-03-03 DIAGNOSIS — G43A1 Cyclical vomiting, intractable: Secondary | ICD-10-CM | POA: Diagnosis not present

## 2015-03-03 DIAGNOSIS — Z79899 Other long term (current) drug therapy: Secondary | ICD-10-CM | POA: Diagnosis not present

## 2015-03-03 DIAGNOSIS — K449 Diaphragmatic hernia without obstruction or gangrene: Secondary | ICD-10-CM | POA: Diagnosis not present

## 2015-03-03 DIAGNOSIS — E669 Obesity, unspecified: Secondary | ICD-10-CM | POA: Diagnosis not present

## 2015-03-03 DIAGNOSIS — Z981 Arthrodesis status: Secondary | ICD-10-CM | POA: Diagnosis not present

## 2015-03-03 DIAGNOSIS — Z79891 Long term (current) use of opiate analgesic: Secondary | ICD-10-CM | POA: Diagnosis not present

## 2015-03-03 DIAGNOSIS — K862 Cyst of pancreas: Secondary | ICD-10-CM | POA: Diagnosis not present

## 2015-03-03 DIAGNOSIS — K760 Fatty (change of) liver, not elsewhere classified: Secondary | ICD-10-CM | POA: Diagnosis not present

## 2015-03-03 DIAGNOSIS — I10 Essential (primary) hypertension: Secondary | ICD-10-CM | POA: Diagnosis not present

## 2015-03-03 DIAGNOSIS — K219 Gastro-esophageal reflux disease without esophagitis: Secondary | ICD-10-CM | POA: Diagnosis not present

## 2015-03-03 DIAGNOSIS — Z800 Family history of malignant neoplasm of digestive organs: Secondary | ICD-10-CM | POA: Diagnosis not present

## 2015-03-03 DIAGNOSIS — Z888 Allergy status to other drugs, medicaments and biological substances status: Secondary | ICD-10-CM | POA: Diagnosis not present

## 2015-03-03 DIAGNOSIS — Z885 Allergy status to narcotic agent status: Secondary | ICD-10-CM | POA: Diagnosis not present

## 2015-03-03 DIAGNOSIS — E876 Hypokalemia: Secondary | ICD-10-CM | POA: Diagnosis not present

## 2015-03-03 DIAGNOSIS — E860 Dehydration: Secondary | ICD-10-CM | POA: Diagnosis not present

## 2015-03-03 LAB — CBC
HEMATOCRIT: 37 % (ref 36.0–46.0)
HEMOGLOBIN: 11.9 g/dL — AB (ref 12.0–15.0)
MCH: 26.3 pg (ref 26.0–34.0)
MCHC: 32.2 g/dL (ref 30.0–36.0)
MCV: 81.7 fL (ref 78.0–100.0)
Platelets: 174 10*3/uL (ref 150–400)
RBC: 4.53 MIL/uL (ref 3.87–5.11)
RDW: 15.5 % (ref 11.5–15.5)
WBC: 4.2 10*3/uL (ref 4.0–10.5)

## 2015-03-03 LAB — COMPREHENSIVE METABOLIC PANEL
ALK PHOS: 50 U/L (ref 38–126)
ALT: 7 U/L — AB (ref 14–54)
AST: 12 U/L — AB (ref 15–41)
Albumin: 3.6 g/dL (ref 3.5–5.0)
Anion gap: 9 (ref 5–15)
BILIRUBIN TOTAL: 0.9 mg/dL (ref 0.3–1.2)
BUN: 11 mg/dL (ref 6–20)
CHLORIDE: 105 mmol/L (ref 101–111)
CO2: 27 mmol/L (ref 22–32)
CREATININE: 1.11 mg/dL — AB (ref 0.44–1.00)
Calcium: 8.7 mg/dL — ABNORMAL LOW (ref 8.9–10.3)
GFR calc Af Amer: 58 mL/min — ABNORMAL LOW (ref >60)
GFR, EST NON AFRICAN AMERICAN: 50 mL/min — AB (ref >60)
Glucose, Bld: 94 mg/dL (ref 65–99)
Potassium: 3.6 mmol/L (ref 3.5–5.1)
Sodium: 141 mmol/L (ref 135–145)
Total Protein: 6.3 g/dL — ABNORMAL LOW (ref 6.5–8.1)

## 2015-03-03 LAB — TROPONIN I
Troponin I: <0.03 ng/mL (ref <0.031)
Troponin I: <0.03 ng/mL (ref <0.031)

## 2015-03-03 LAB — GLUCOSE, CAPILLARY: GLUCOSE-CAPILLARY: 90 mg/dL (ref 65–99)

## 2015-03-03 MED ORDER — ADULT MULTIVITAMIN LIQUID CH
5.0000 mL | Freq: Every day | ORAL | Status: DC
  Administered 2015-03-04 – 2015-03-07 (×4): 5 mL via ORAL
  Filled 2015-03-03 (×5): qty 5

## 2015-03-03 MED ORDER — BUTALBITAL-APAP-CAFFEINE 50-325-40 MG PO TABS
2.0000 | ORAL_TABLET | Freq: Once | ORAL | Status: AC
  Administered 2015-03-03: 2 via ORAL
  Filled 2015-03-03: qty 2

## 2015-03-03 NOTE — Consult Note (Signed)
Subjective:   HPI  The patient is a 69 year old female who we are asked to see in regards to vomiting which she has been experiencing over the past month. Patient states that shortly after eating she vomits her food. She feels better if she doesn't eat. She's not complaining of heartburn. She just states that after the food gets down into her stomach she throws up. Sometimes it makes her pass out. She had an EGD in September 2015 which showed just some nonspecific distal gastritis. No evidence of any obstruction.  Review of Systems No chest pain or shortness of breath  Past Medical History  Diagnosis Date  . Hypertension   . Hyperlipidemia   . GERD (gastroesophageal reflux disease)   . Arthritis    Past Surgical History  Procedure Laterality Date  . Neck surgery    . Abdominal hysterectomy    . Replacement total knee bilateral    . Back surgery    . Thyroid surgery    . Anterior cervical decomp/discectomy fusion N/A 07/20/2012    Procedure: ANTERIOR CERVICAL DISCECTOMY FUSION C5-6, C6-7 with transgraft(Alphatec), local bone graft, plate and screws ;  Surgeon: Kerrin Champagne, MD;  Location: [facility] OR;  Service: Orthopedics;  Laterality: N/A;   Social History   Social History  . Marital Status: Divorced    Spouse Name: N/A  . Number of Children: 2  . Years of Education: N/A   Occupational History  . Retired    Social History Main Topics  . Smoking status: Never Smoker   . Smokeless tobacco: Never Used  . Alcohol Use: No  . Drug Use: No  . Sexual Activity: Not on file   Other Topics Concern  . Not on file   Social History Narrative   family history includes Breast cancer in her sister; Colon cancer in her father; Cystic fibrosis in her sister; Heart disease in her father, mother, and sister; Ovarian cancer in her mother; Prostate cancer in her brother and father. There is no history of Pancreatic cancer, Rectal cancer, or Stomach cancer.  Current facility-administered  medications:  .  0.9 %  sodium chloride infusion, , Intravenous, Continuous, Arby Barrette, MD, Last Rate: 100 mL/hr at 03/03/15 0844 .  0.9 %  sodium chloride infusion, , Intravenous, STAT, Arby Barrette, MD .  enoxaparin (LOVENOX) injection 60 mg, 60 mg, Subcutaneous, Q24H, Meredeth Ide, MD, 60 mg at 03/02/15 2213 .  feeding supplement (BOOST / RESOURCE BREEZE) liquid 1 Container, 1 Container, Oral, TID BM, Meredeth Ide, MD, 1 Container at 03/03/15 1108 .  losartan (COZAAR) tablet 100 mg, 100 mg, Oral, Daily, Meredeth Ide, MD, 100 mg at 03/03/15 1106 .  multivitamin liquid 5 mL, 5 mL, Oral, Daily, Jessica M Ostheim, RD .  ondansetron (ZOFRAN) tablet 4 mg, 4 mg, Oral, Q6H PRN **OR** ondansetron (ZOFRAN) injection 4 mg, 4 mg, Intravenous, Q6H PRN, Meredeth Ide, MD, 4 mg at 03/03/15 0844 .  oxyCODONE-acetaminophen (PERCOCET/ROXICET) 5-325 MG per tablet 1 tablet, 1 tablet, Oral, Q6H PRN, Meredeth Ide, MD, 1 tablet at 03/02/15 2214 .  pantoprazole (PROTONIX) EC tablet 40 mg, 40 mg, Oral, Daily, Meredeth Ide, MD, 40 mg at 03/03/15 1106 .  simvastatin (ZOCOR) tablet 20 mg, 20 mg, Oral, QPM, Meredeth Ide, MD .  sodium chloride 0.9 % injection 3 mL, 3 mL, Intravenous, Q12H, Meredeth Ide, MD, 3 mL at 03/02/15 2231 .  triamterene-hydrochlorothiazide (MAXZIDE-25) 37.5-25 MG per tablet 1 tablet, 1  tablet, Oral, Daily, Meredeth IdeGagan S Lama, MD, 1 tablet at 03/03/15 1106 Allergies  Allergen Reactions  . Lisinopril Swelling    Swelling in mouth and throat  . Penicillins Rash    Has patient had a PCN reaction causing immediate rash, facial/tongue/throat swelling, SOB or lightheadedness with hypotension: No Has patient had a PCN reaction causing severe rash involving mucus membranes or skin necrosis: No Has patient had a PCN reaction that required hospitalization No Has patient had a PCN reaction occurring within the last 10 years: No If all of the above answers are "NO", then may proceed with Cephalosporin use.    . Tramadol Hcl Rash     Objective:     BP 115/59 mmHg  Pulse 50  Temp(Src) 97.8 F (36.6 C) (Oral)  Resp 20  Ht 5\' 4"  (1.626 m)  Wt 116.529 kg (256 lb 14.4 oz)  BMI 44.08 kg/m2  SpO2 98% she is in no distress  Heart regular rhythm no murmurs  Lungs clear  Abdomen is soft and nontender without hepatosplenomegaly  Laboratory No components found for: D1    Assessment:     Vomiting, etiology unclear      Plan:     I would recommend an upper GI series. If this is negative I would recommend a gastric emptying study to look for any evidence of a gastric motility disorder.

## 2015-03-03 NOTE — Progress Notes (Signed)
  Echocardiogram 2D Echocardiogram has been performed.  Joey Lierman 03/03/2015, 11:40 AM

## 2015-03-03 NOTE — Progress Notes (Signed)
Patient attempted to eat clear liquids per MD request, though she was nauseous prior to eating. Also, Zofran 4mg  IV had been given prior to eating as patient was nauseous and the nausea had been worsened with standing to check orthostatic vital signs. Patient drank about 60cc of cranberry juice and a few teaspoons of chicken broth. Patient vomited immediately after drinking those liquids. Patient only vomited about 20cc of vomit that was the same pale yellow color of the chicken broth, but did have dry heaves that lasted a few minutes. MD made aware of events and patients efforts. MD reported to RN that he would consult GI. Will continue to monitor patient and symptoms.   Arta BruceDeutsch, Filomeno Cromley [facility] LLC[facility]ULER 03/03/2015

## 2015-03-03 NOTE — Progress Notes (Signed)
Initial Nutrition Assessment  DOCUMENTATION CODES:   Morbid obesity  INTERVENTION:  - Continue Boost Breeze TID (each supplement contains 250 kcal and 9 gm protein). - Provide Multivitamin.  - RD team will continue to monitor for needs.   NUTRITION DIAGNOSIS:   Inadequate oral intake related to poor appetite, vomiting as evidenced by energy intake < 75% for > or equal to 1 month.   GOAL:   Patient will meet greater than or equal to 90% of their needs  MONITOR:   PO intake, Supplement acceptance, Labs, Weight trends  REASON FOR ASSESSMENT:   Malnutrition Screening Tool    ASSESSMENT:   69 year old female who  has a past medical history of Hypertension; Hyperlipidemia; GERD (gastroesophageal reflux disease); and Arthritis.  Patient lying in bed, very pleasant during MST assessment. Patient reports positive for poor appetite, nausea, vomiting and abdominal pain. Per nurse, after doctor's encouragement, patient trialed about 60 cc's of orange juice this morning, and vomiting after. Patient is currently receiving IV Zofran, but vomiting continues. Nurse states that a GI consult is pending.   Patient describes symptoms started happening about 1 month prior. Since that time she has been eating very little due to poor tolerance. She states her usual weight is 270 lbs greater than 1 year ago; she is currently 256 lbs (5% weight loss; which is non-significant for the timeframe). She is unable to describe if any foods in particular are increasing symptoms, but describes an incident following eating a rich, cream-cheese cake. Patient cooks primarily at home. Before falling sick, usual foods included: oatmeal, grits and eggs (salt, pepper and cheese) in the morning. Lunch is a grilled cheese/ham sandwich or leftovers. Dinner is often a meat (stew beef, chicken, [place]urkey, etc) with rice and broccoli.   Patient describes some recent taste and smell changes, and reports her normal foods just don't  taste the same.   Boost Breeze supplement was at bedside. Patient reported willingness to try it, but she is intentionally limiting intake because she was afraid of vomiting. Encouraged patient to continue to try drinking Boost if she as much as she is able to tolerate.   NFPE performed - patient has mild fat wasting - orbital's; but otherwise is well nourished.   Labs reviewed: low Heme (11.9), high Cr (1.11), low Ca (8.7), low WBC (3.4), high lipase (54), low ALT (8)  Medications reviewed.   Diet Order:  Diet clear liquid Room service appropriate?: Yes; Fluid consistency:: Thin  Skin:  Reviewed, no issues  Last BM:  unknown  Height:   Ht Readings from Last 1 Encounters:  03/02/15 5\' 4"  (1.626 m)    Weight:   Wt Readings from Last 1 Encounters:  03/03/15 256 lb 14.4 oz (116.529 kg)    Ideal Body Weight:  54.5 kg  BMI:  Body mass index is 44.08 kg/(m^2).  Estimated Nutritional Needs:   Kcal:  1750-2050 kcal  Protein:  115-125 gm  Fluid:  1.7-2 L/day  EDUCATION NEEDS:   Education needs no appropriate at this time  Delano MetzMaggie Shellee Streng, Dietetic Intern 03/03/2015 11:49 AM

## 2015-03-03 NOTE — Progress Notes (Signed)
[facility] PROGRESS NOTE  [name] WUJ:[id number] DOB: [birth year] DOA: 03/02/2015 PCP: Gwynneth AlimentSANDERS,ROBYN N, MD  Assessment/Plan: 1. Syncope- ? Cause, Kartik enzymes 3 negative, echocardiogram done report pending, orthostatic vital signs negative. Continue to monitor on telemetry. If echo is negative patient will benefit from Holter monitoring as outpatient. 2. Nausea and vomiting- patient has recurrent nausea and vomiting going on for past one month, will get GI consultation for further evaluation. 3. Hypertension- blood pressure is stable, continue the home medications of Maxide 25, losartan. 4. DVT prophylaxis- Lovenox  Code Status: Full code Family Communication: No family at bedside Disposition Plan: Home when medically stable   Consultants:  None  Procedures:  None  Antibiotics:  None  HPI/Subjective: 69 year old female who  has a past medical history of Hypertension; Hyperlipidemia; GERD (gastroesophageal reflux disease); and Arthritis. Today presents to the hospital after patient had episode of syncope. As per patient she has been feeling dizzy especially on eating up from sitting position for past few days. She also has been having episodes of vomiting whenever she eats something. She ate this morning after that she started throwing up then patient went to her house and when she got out of the car and started to walk to her home as per patient's friend she passed out. Patient did not hit her head or face has friend was able to break the fall. Patient did not respond for a few minutes with eyes rolled back. After patient's son came out when patient began to come around. There was no witnessed seizure-like activity, patient denies chest pain or shortness of breath. No headache or blurred vision. Denies dysuria urgency or frequency of urination. In the ED lab work did not show significant abnormality. EKG showed bradycardia with prolonged PR interval 144 ms  This  morning patient continues to have vomiting and nausea. Especially after eating. Orthostatic vital signs negative, but patient does become dizzy on standing  Objective: Filed Vitals:   03/03/15 0616  BP: 115/79  Pulse: 49  Temp: 97.5 F (36.4 C)  Resp: 20    Intake/Output Summary (Last 24 hours) at 03/03/15 1020 Last data filed at 03/03/15 0839  Gross per 24 hour  Intake 1428.33 ml  Output    500 ml  Net 928.33 ml   Filed Weights   03/02/15 1347 03/02/15 1802 03/03/15 0622  Weight: 107.956 kg (238 lb) 117 kg (257 lb 15 oz) 116.529 kg (256 lb 14.4 oz)    Exam:   General:  Appears in no acute distress  Cardiovascular: S1-S2 regular  Respiratory: Clear to auscultation bilaterally  Abdomen: Soft, nontender, no organomegaly  Musculoskeletal: No cyanosis/clubbing/edema of the lower extremities   Data Reviewed: Basic Metabolic Panel:  Recent Labs Lab 03/02/15 1409 03/03/15 0536  NA 138 141  K 3.6 3.6  CL 103 105  CO2 28 27  GLUCOSE 127* 94  BUN 14 11  CREATININE 1.04* 1.11*  CALCIUM 9.0 8.7*   Liver Function Tests:  Recent Labs Lab 03/02/15 1409 03/03/15 0536  AST 17 12*  ALT 8* 7*  ALKPHOS 57 50  BILITOT 0.6 0.9  PROT 7.1 6.3*  ALBUMIN 3.9 3.6    Recent Labs Lab 03/02/15 1409  LIPASE 54*   No results for input(s): AMMONIA in the last 168 hours. CBC:  Recent Labs Lab 03/02/15 1409 03/03/15 0536  WBC 3.4* 4.2  HGB 12.2 11.9*  HCT 38.0 37.0  MCV 80.9 81.7  PLT 196 174   Cardiac Enzymes:  Recent Labs Lab 03/02/15 1409 03/02/15 2005 03/03/15 0012 03/03/15 0536  TROPONINI <0.03 <0.03 <0.03 <0.03   BNP (last 3 results) No results for input(s): BNP in the last 8760 hours.  ProBNP (last 3 results) No results for input(s): PROBNP in the last 8760 hours.  CBG:  Recent Labs Lab 03/02/15 1355 03/03/15 0741  GLUCAP 124* 90    No results found for this or any previous visit (from the past 240 hour(s)).   Studies: Dg Chest 2  View  03/02/2015  CLINICAL DATA:  69 year old female with syncope. EXAM: CHEST  2 VIEW COMPARISON:  05/30/2014 and prior chest radiographs dating back to 04/05/2007 FINDINGS: The cardiomediastinal silhouette is unremarkable. There is no evidence of focal airspace disease, pulmonary edema, suspicious pulmonary nodule/mass, pleural effusion, or pneumothorax. No acute bony abnormalities are identified. Lower cervical spine fusion changes again noted. IMPRESSION: No active cardiopulmonary disease. Electronically Signed   By: Harmon Pier M.D.   On: 03/02/2015 17:43   Ct Head Wo Contrast  03/02/2015  CLINICAL DATA:  Dizziness. EXAM: CT HEAD WITHOUT CONTRAST TECHNIQUE: Contiguous axial images were obtained from the base of the skull through the vertex without intravenous contrast. COMPARISON:  04/29/2013 FINDINGS: There is prominence of the ventricles, similar to previous exam. Low attenuation throughout the subcortical and periventricular white matter is noted compatible with chronic microvascular disease. There is no evidence for acute cortical infarct, intracranial hemorrhage or mass. The paranasal sinuses and mastoid air cells are clear. The calvarium appears intact. IMPRESSION: 1. No acute intracranial abnormalities. 2. Chronic microvascular disease and brain atrophy. Electronically Signed   By: Signa Kell M.D.   On: 03/02/2015 14:40    Scheduled Meds: . sodium chloride   Intravenous STAT  . enoxaparin (LOVENOX) injection  60 mg Subcutaneous Q24H  . feeding supplement  1 Container Oral TID BM  . Influenza vac split quadrivalent PF  0.5 mL Intramuscular Tomorrow-1000  . losartan  100 mg Oral Daily  . pantoprazole  40 mg Oral Daily  . simvastatin  20 mg Oral QPM  . sodium chloride  3 mL Intravenous Q12H  . triamterene-hydrochlorothiazide  1 tablet Oral Daily   Continuous Infusions: . sodium chloride 100 mL/hr at 03/03/15 0844    Active Problems:   Essential hypertension   Syncope and  collapse   Syncope    Time spent: 25 min    [facility] S  [facility] Pager [phone]. If 7PM-7AM, please contact night-coverage at [email or web address], password [facility] 03/03/2015, 10:20 AM

## 2015-03-04 DIAGNOSIS — Z8249 Family history of ischemic heart disease and other diseases of the circulatory system: Secondary | ICD-10-CM | POA: Diagnosis not present

## 2015-03-04 DIAGNOSIS — Z888 Allergy status to other drugs, medicaments and biological substances status: Secondary | ICD-10-CM | POA: Diagnosis not present

## 2015-03-04 DIAGNOSIS — I10 Essential (primary) hypertension: Secondary | ICD-10-CM | POA: Diagnosis present

## 2015-03-04 DIAGNOSIS — E860 Dehydration: Secondary | ICD-10-CM | POA: Diagnosis present

## 2015-03-04 DIAGNOSIS — Z6841 Body Mass Index (BMI) 40.0 and over, adult: Secondary | ICD-10-CM | POA: Diagnosis not present

## 2015-03-04 DIAGNOSIS — G43A0 Cyclical vomiting, not intractable: Secondary | ICD-10-CM | POA: Diagnosis not present

## 2015-03-04 DIAGNOSIS — Z880 Allergy status to penicillin: Secondary | ICD-10-CM | POA: Diagnosis not present

## 2015-03-04 DIAGNOSIS — K760 Fatty (change of) liver, not elsewhere classified: Secondary | ICD-10-CM | POA: Diagnosis present

## 2015-03-04 DIAGNOSIS — Z800 Family history of malignant neoplasm of digestive organs: Secondary | ICD-10-CM | POA: Diagnosis not present

## 2015-03-04 DIAGNOSIS — K449 Diaphragmatic hernia without obstruction or gangrene: Secondary | ICD-10-CM | POA: Diagnosis present

## 2015-03-04 DIAGNOSIS — Z981 Arthrodesis status: Secondary | ICD-10-CM | POA: Diagnosis not present

## 2015-03-04 DIAGNOSIS — M1990 Unspecified osteoarthritis, unspecified site: Secondary | ICD-10-CM | POA: Diagnosis present

## 2015-03-04 DIAGNOSIS — R001 Bradycardia, unspecified: Secondary | ICD-10-CM | POA: Diagnosis present

## 2015-03-04 DIAGNOSIS — E876 Hypokalemia: Secondary | ICD-10-CM | POA: Diagnosis not present

## 2015-03-04 DIAGNOSIS — Z803 Family history of malignant neoplasm of breast: Secondary | ICD-10-CM | POA: Diagnosis not present

## 2015-03-04 DIAGNOSIS — K862 Cyst of pancreas: Secondary | ICD-10-CM | POA: Diagnosis present

## 2015-03-04 DIAGNOSIS — Z885 Allergy status to narcotic agent status: Secondary | ICD-10-CM | POA: Diagnosis not present

## 2015-03-04 DIAGNOSIS — R55 Syncope and collapse: Secondary | ICD-10-CM | POA: Diagnosis present

## 2015-03-04 DIAGNOSIS — E669 Obesity, unspecified: Secondary | ICD-10-CM | POA: Diagnosis present

## 2015-03-04 DIAGNOSIS — G43A1 Cyclical vomiting, intractable: Secondary | ICD-10-CM | POA: Diagnosis not present

## 2015-03-04 DIAGNOSIS — Z8041 Family history of malignant neoplasm of ovary: Secondary | ICD-10-CM | POA: Diagnosis not present

## 2015-03-04 DIAGNOSIS — K219 Gastro-esophageal reflux disease without esophagitis: Secondary | ICD-10-CM | POA: Diagnosis present

## 2015-03-04 DIAGNOSIS — E785 Hyperlipidemia, unspecified: Secondary | ICD-10-CM | POA: Diagnosis present

## 2015-03-04 DIAGNOSIS — Z79891 Long term (current) use of opiate analgesic: Secondary | ICD-10-CM | POA: Diagnosis not present

## 2015-03-04 DIAGNOSIS — Z79899 Other long term (current) drug therapy: Secondary | ICD-10-CM | POA: Diagnosis not present

## 2015-03-04 LAB — GLUCOSE, CAPILLARY
GLUCOSE-CAPILLARY: 82 mg/dL (ref 65–99)
Glucose-Capillary: 99 mg/dL (ref 65–99)

## 2015-03-04 NOTE — Care Management Obs Status (Signed)
MEDICARE OBSERVATION STATUS NOTIFICATION   Patient Details  Name: [name] MRN: [id number] Date of Birth: 01/10/1946   Medicare Observation Status Notification Given:  Yes    Elliot CousinShavis, Sherron Mapp Ellen, RN 03/04/2015, 6:58 PM

## 2015-03-04 NOTE — Progress Notes (Signed)
[facility] PROGRESS NOTE  LEXEY FLETES ZOX:[id number] DOB: [birth year] DOA: 03/02/2015 PCP: Gwynneth Aliment, MD  Assessment/Plan: 1. Syncope- ? Cause, cardiac enzymes 3 negative, echocardiogram done shows no significant abnormality, orthostatic vital signs negative. Continue to monitor on telemetry. I patient will benefit from Holter monitoring as outpatient. 2. Nausea and vomiting- patient has recurrent nausea and vomiting going on for past one month. GI has seen the patient and ordered upper GI series. Upper GI series is pending. Continue Zofran when necessary for nausea and vomiting. 3. Hypertension- blood pressure is stable, continue the home medications of Maxide 25, losartan. 4. DVT prophylaxis- Lovenox  Code Status: Full code Family Communication: No family at bedside Disposition Plan: Home when medically stable   Consultants:  None  Procedures:  None  Antibiotics:  None  HPI/Subjective: 69 year old female who  has a past medical history of Hypertension; Hyperlipidemia; GERD (gastroesophageal reflux disease); and Arthritis. Today presents to the hospital after patient had episode of syncope. As per patient she has been feeling dizzy especially on eating up from sitting position for past few days. She also has been having episodes of vomiting whenever she eats something. She ate this morning after that she started throwing up then patient went to her house and when she got out of the car and started to walk to her home as per patient's friend she passed out. Patient did not hit her head or face has friend was able to break the fall. Patient did not respond for a few minutes with eyes rolled back. After patient's son came out when patient began to come around. There was no witnessed seizure-like activity, patient denies chest pain or shortness of breath. No headache or blurred vision. Denies dysuria urgency or frequency of urination. In the ED lab work did not show  significant abnormality. EKG showed bradycardia with prolonged PR interval 144 ms  This morning patient feels better. Says that as long as she does not eat she does not get nausea and vomiting.  Objective: Filed Vitals:   03/04/15 0538  BP: 118/66  Pulse: 54  Temp: 98.5 F (36.9 C)  Resp: 20    Intake/Output Summary (Last 24 hours) at 03/04/15 1410 Last data filed at 03/03/15 2300  Gross per 24 hour  Intake 1426.67 ml  Output      0 ml  Net 1426.67 ml   Filed Weights   03/02/15 1347 03/02/15 1802 03/03/15 0622  Weight: 107.956 kg (238 lb) 117 kg (257 lb 15 oz) 116.529 kg (256 lb 14.4 oz)    Exam:   General:  Appears in no acute distress  Cardiovascular: S1-S2 regular  Respiratory: Clear to auscultation bilaterally  Abdomen: Soft, nontender, no organomegaly  Musculoskeletal: No cyanosis/clubbing/edema of the lower extremities   Data Reviewed: Basic Metabolic Panel:  Recent Labs Lab 03/02/15 1409 03/03/15 0536  NA 138 141  K 3.6 3.6  CL 103 105  CO2 28 27  GLUCOSE 127* 94  BUN 14 11  CREATININE 1.04* 1.11*  CALCIUM 9.0 8.7*   Liver Function Tests:  Recent Labs Lab 03/02/15 1409 03/03/15 0536  AST 17 12*  ALT 8* 7*  ALKPHOS 57 50  BILITOT 0.6 0.9  PROT 7.1 6.3*  ALBUMIN 3.9 3.6    Recent Labs Lab 03/02/15 1409  LIPASE 54*   No results for input(s): AMMONIA in the last 168 hours. CBC:  Recent Labs Lab 03/02/15 1409 03/03/15 0536  WBC 3.4* 4.2  HGB 12.2 11.9*  HCT  38.0 37.0  MCV 80.9 81.7  PLT 196 174   Cardiac Enzymes:  Recent Labs Lab 03/02/15 1409 03/02/15 2005 03/03/15 0012 03/03/15 0536  TROPONINI <0.03 <0.03 <0.03 <0.03    CBG:  Recent Labs Lab 03/02/15 1355 03/03/15 0741 03/04/15 0724  GLUCAP 124* 90 82    No results found for this or any previous visit (from the past 240 hour(s)).   Studies: Dg Chest 2 View  03/02/2015  CLINICAL DATA:  69 year old female with syncope. EXAM: CHEST  2 VIEW COMPARISON:   05/30/2014 and prior chest radiographs dating back to 04/05/2007 FINDINGS: The cardiomediastinal silhouette is unremarkable. There is no evidence of focal airspace disease, pulmonary edema, suspicious pulmonary nodule/mass, pleural effusion, or pneumothorax. No acute bony abnormalities are identified. Lower cervical spine fusion changes again noted. IMPRESSION: No active cardiopulmonary disease. Electronically Signed   By: Harmon PierJeffrey  Hu M.D.   On: 03/02/2015 17:43   Ct Head Wo Contrast  03/02/2015  CLINICAL DATA:  Dizziness. EXAM: CT HEAD WITHOUT CONTRAST TECHNIQUE: Contiguous axial images were obtained from the base of the skull through the vertex without intravenous contrast. COMPARISON:  04/29/2013 FINDINGS: There is prominence of the ventricles, similar to previous exam. Low attenuation throughout the subcortical and periventricular white matter is noted compatible with chronic microvascular disease. There is no evidence for acute cortical infarct, intracranial hemorrhage or mass. The paranasal sinuses and mastoid air cells are clear. The calvarium appears intact. IMPRESSION: 1. No acute intracranial abnormalities. 2. Chronic microvascular disease and brain atrophy. Electronically Signed   By: Signa Kellaylor  Stroud M.D.   On: 03/02/2015 14:40    Scheduled Meds: . enoxaparin (LOVENOX) injection  60 mg Subcutaneous Q24H  . feeding supplement  1 Container Oral TID BM  . losartan  100 mg Oral Daily  . multivitamin  5 mL Oral Daily  . pantoprazole  40 mg Oral Daily  . simvastatin  20 mg Oral QPM  . sodium chloride  3 mL Intravenous Q12H  . triamterene-hydrochlorothiazide  1 tablet Oral Daily   Continuous Infusions: . sodium chloride 100 mL/hr at 03/04/15 1352    Active Problems:   Essential hypertension   Syncope and collapse   Syncope   Nausea and vomiting    Time spent: 25 min    [facility],Camden Knotek S  [facility] Pager [phone]. If 7PM-7AM, please contact night-coverage at [email or web address],  password [email or web address] 03/04/2015, 2:10 PM

## 2015-03-04 NOTE — Progress Notes (Signed)
Per radiology, unable to do Upper GI study today. It will be done Monday, pt needs to be NPO post MN Sunday.

## 2015-03-04 NOTE — Progress Notes (Signed)
Subjective: Post-prandial vomiting, early satiety and right upper quadrant pain x 4 months.  Objective: Vital signs in last 24 hours: Temp:  [97.3 F (36.3 C)-98.5 F (36.9 C)] 98.5 F (36.9 C) (11/05 0538) Pulse Rate:  [50-58] 54 (11/05 0538) Resp:  [20] 20 (11/05 0538) BP: (111-126)/(59-72) 118/66 mmHg (11/05 0538) SpO2:  [95 %-100 %] 98 % (11/05 0538) Weight change:  Last BM Date: 03/02/15  PE: GEN:  Overweight, NAD ABD:  Soft, mild epigastric and right upper quadrant tenderness without peritonitis  Lab Results: CBC    Component Value Date/Time   WBC 4.2 03/03/2015 0536   RBC 4.53 03/03/2015 0536   HGB 11.9* 03/03/2015 0536   HCT 37.0 03/03/2015 0536   PLT 174 03/03/2015 0536   MCV 81.7 03/03/2015 0536   MCH 26.3 03/03/2015 0536   MCHC 32.2 03/03/2015 0536   RDW 15.5 03/03/2015 0536   LYMPHSABS 0.6* 05/30/2014 1747   MONOABS 0.6 05/30/2014 1747   EOSABS 0.0 05/30/2014 1747   BASOSABS 0.0 05/30/2014 1747   CMP     Component Value Date/Time   NA 141 03/03/2015 0536   K 3.6 03/03/2015 0536   CL 105 03/03/2015 0536   CO2 27 03/03/2015 0536   GLUCOSE 94 03/03/2015 0536   BUN 11 03/03/2015 0536   CREATININE 1.11* 03/03/2015 0536   CALCIUM 8.7* 03/03/2015 0536   PROT 6.3* 03/03/2015 0536   ALBUMIN 3.6 03/03/2015 0536   AST 12* 03/03/2015 0536   ALT 7* 03/03/2015 0536   ALKPHOS 50 03/03/2015 0536   BILITOT 0.9 03/03/2015 0536   GFRNONAA 50* 03/03/2015 0536   GFRAA 58* 03/03/2015 0536   Assessment:  1.  Vomiting. 2.  Early satiety. 3.  Right upper quadrant abdominal pain (post-prandial).   4.  Fatty liver without gallstones, on ultrasound June 2015.  Plan:  1.  Supportive management with intravenous fluids, antiemetics. 2.  UGI series pending. 3.  If UGI series is unrevealing, consider gastric emptying study. 4.  Will follow.   Freddy JakschOUTLAW,Nikolaos Maddocks M 03/04/2015, 12:54 PM   Pager [phone] If no answer or after 5 PM call [phone]

## 2015-03-04 NOTE — Progress Notes (Signed)
Report received from M.C. Sullivan, RN. No change from initial pm assessment. Will continue to monitor and follow the POC. 

## 2015-03-05 ENCOUNTER — Inpatient Hospital Stay (HOSPITAL_COMMUNITY): Payer: Commercial Managed Care - HMO

## 2015-03-05 LAB — GLUCOSE, CAPILLARY: GLUCOSE-CAPILLARY: 85 mg/dL (ref 65–99)

## 2015-03-05 MED ORDER — ACETAMINOPHEN 325 MG PO TABS
650.0000 mg | ORAL_TABLET | Freq: Four times a day (QID) | ORAL | Status: DC | PRN
  Administered 2015-03-06 – 2015-03-08 (×3): 650 mg via ORAL
  Filled 2015-03-05 (×3): qty 2

## 2015-03-05 NOTE — Progress Notes (Signed)
[facility] PROGRESS NOTE  [name] ZOX:[id number] DOB: [birth year] DOA: 03/02/2015 PCP: Gwynneth Aliment, MD  Assessment/Plan: 1. Syncope- ? Cause, cardiac enzymes 3 negative, echocardiogram done shows no significant abnormality, orthostatic vital signs negative. Continue to monitor on telemetry.  patient will benefit from Holter monitoring as outpatient. 2. Nausea and vomiting- patient has recurrent nausea and vomiting going on for past one month. GI has seen the patient and ordered upper GI series. Upper GI series is pending. Continue Zofran when necessary for nausea and vomiting. Abdominal ultrasound done this morning showed 3 mm dilated pancreatic duct and MR I abdominal with and without contrast recommended to rule out pancreatic head mass. We'll order the MRI abdomen. 3. Hypertension- blood pressure is stable, continue the home medications of Maxide 25, losartan. 4. DVT prophylaxis- Lovenox  Code Status: Full code Family Communication: No family at bedside Disposition Plan: Home when medically stable   Consultants:  None  Procedures:  None  Antibiotics:  None  HPI/Subjective: 69 year old female who  has a past medical history of Hypertension; Hyperlipidemia; GERD (gastroesophageal reflux disease); and Arthritis. Today presents to the hospital after patient had episode of syncope. As per patient she has been feeling dizzy especially on eating up from sitting position for past few days. She also has been having episodes of vomiting whenever she eats something. She ate this morning after that she started throwing up then patient went to her house and when she got out of the car and started to walk to her home as per patient's friend she passed out. Patient did not hit her head or face has friend was able to break the fall. Patient did not respond for a few minutes with eyes rolled back. After patient's son came out when patient began to come around. There was no  witnessed seizure-like activity, patient denies chest pain or shortness of breath. No headache or blurred vision. Denies dysuria urgency or frequency of urination. In the ED lab work did not show significant abnormality. EKG showed bradycardia with prolonged PR interval 144 ms  This morning patient continues to have nausea after eating. Upper GI series is still pending. Abdominal ultrasound was done this morning which showed dilated pancreatic duct.  Objective: Filed Vitals:   03/05/15 1358  BP: 156/88  Pulse: 55  Temp: 98.1 F (36.7 C)  Resp: 18    Intake/Output Summary (Last 24 hours) at 03/05/15 1520 Last data filed at 03/05/15 0500  Gross per 24 hour  Intake   3000 ml  Output      0 ml  Net   3000 ml   Filed Weights   03/02/15 1347 03/02/15 1802 03/03/15 0622  Weight: 107.956 kg (238 lb) 117 kg (257 lb 15 oz) 116.529 kg (256 lb 14.4 oz)    Exam:   General:  Appears in no acute distress  Cardiovascular: S1-S2 regular  Respiratory: Clear to auscultation bilaterally  Abdomen: Soft, positive right upper quadrant tenderness to palpation , no organomegaly  Musculoskeletal: No cyanosis/clubbing/edema of the lower extremities   Data Reviewed: Basic Metabolic Panel:  Recent Labs Lab 03/02/15 1409 03/03/15 0536  NA 138 141  K 3.6 3.6  CL 103 105  CO2 28 27  GLUCOSE 127* 94  BUN 14 11  CREATININE 1.04* 1.11*  CALCIUM 9.0 8.7*   Liver Function Tests:  Recent Labs Lab 03/02/15 1409 03/03/15 0536  AST 17 12*  ALT 8* 7*  ALKPHOS 57 50  BILITOT 0.6 0.9  PROT 7.1  6.3*  ALBUMIN 3.9 3.6    Recent Labs Lab 03/02/15 1409  LIPASE 54*   No results for input(s): AMMONIA in the last 168 hours. CBC:  Recent Labs Lab 03/02/15 1409 03/03/15 0536  WBC 3.4* 4.2  HGB 12.2 11.9*  HCT 38.0 37.0  MCV 80.9 81.7  PLT 196 174   Cardiac Enzymes:  Recent Labs Lab 03/02/15 1409 03/02/15 2005 03/03/15 0012 03/03/15 0536  TROPONINI <0.03 <0.03 <0.03 <0.03     CBG:  Recent Labs Lab 03/02/15 1355 03/03/15 0741 03/03/15 1653 03/04/15 0724 03/05/15 0521  GLUCAP 124* 90 99 82 85    No results found for this or any previous visit (from the past 240 hour(s)).   Studies: [place] Abdomen Complete  03/05/2015  CLINICAL DATA:  Nausea and vomiting. EXAM: ULTRASOUND ABDOMEN COMPLETE COMPARISON:  10/14/2013 abdominal sonogram. FINDINGS: Gallbladder: No gallstones or wall thickening visualized. No sonographic Murphy sign noted. Common bile duct: Diameter: 5 mm Liver: Liver parenchyma is diffusely mildly echogenic with mild posterior acoustic attenuation, suggesting mild diffuse hepatic steatosis. No liver mass is detected, noting decreased sensitivity in the setting of an echogenic liver. No liver surface irregularity is demonstrated. IVC: No abnormality visualized. Pancreas: There is borderline dilatation of the main pancreatic duct (3 mm diameter, previously 2 mm). No mass in the visualized pancreas, noted limited visualization of the pancreatic head and tail due to overlying bowel gas. Spleen: Size and appearance within normal limits. Right Kidney: Length: 10.4 cm. Echogenicity within normal limits. No mass or hydronephrosis visualized. Left Kidney: Length: 10.6 cm. Echogenicity within normal limits. No mass or hydronephrosis visualized. Abdominal aorta: No aneurysm visualized. Other findings: None. IMPRESSION: 1. Borderline dilated main pancreatic duct. Recommend CT abdomen with intravenous contrast or MRI abdomen with and without intravenous contrast to exclude a pancreatic head mass. 2. Mild diffuse hepatic steatosis. 3. Otherwise normal abdominal sonogram, with no cholelithiasis. Electronically Signed   By: Delbert PhenixJason A Poff M.D.   On: 03/05/2015 14:28    Scheduled Meds: . enoxaparin (LOVENOX) injection  60 mg Subcutaneous Q24H  . feeding supplement  1 Container Oral TID BM  . losartan  100 mg Oral Daily  . multivitamin  5 mL Oral Daily  . pantoprazole  40 mg  Oral Daily  . simvastatin  20 mg Oral QPM  . sodium chloride  3 mL Intravenous Q12H  . triamterene-hydrochlorothiazide  1 tablet Oral Daily   Continuous Infusions: . sodium chloride 100 mL/hr at 03/04/15 2314    Active Problems:   Essential hypertension   Syncope and collapse   Syncope   Nausea and vomiting    Time spent: 25 min    [facility]AMA,Amram Maya S  [facility] Pager [phone]. If 7PM-7AM, please contact night-coverage at [email or web address], password [email or web address] 03/05/2015, 3:20 PM  LOS: 1 day

## 2015-03-05 NOTE — Progress Notes (Signed)
Awaiting UGI study, which seems to be scheduled for Monday 03/06/15.  No further input until UGI results available.

## 2015-03-06 ENCOUNTER — Inpatient Hospital Stay (HOSPITAL_COMMUNITY): Payer: Commercial Managed Care - HMO

## 2015-03-06 LAB — GLUCOSE, CAPILLARY: Glucose-Capillary: 72 mg/dL (ref 65–99)

## 2015-03-06 LAB — COMPREHENSIVE METABOLIC PANEL
ALBUMIN: 3.3 g/dL — AB (ref 3.5–5.0)
ALT: 8 U/L — ABNORMAL LOW (ref 14–54)
AST: 18 U/L (ref 15–41)
Alkaline Phosphatase: 50 U/L (ref 38–126)
Anion gap: 4 — ABNORMAL LOW (ref 5–15)
BUN: 7 mg/dL (ref 6–20)
CHLORIDE: 108 mmol/L (ref 101–111)
CO2: 29 mmol/L (ref 22–32)
Calcium: 8.4 mg/dL — ABNORMAL LOW (ref 8.9–10.3)
Creatinine, Ser: 1.21 mg/dL — ABNORMAL HIGH (ref 0.44–1.00)
GFR calc Af Amer: 52 mL/min — ABNORMAL LOW (ref >60)
GFR calc non Af Amer: 45 mL/min — ABNORMAL LOW (ref >60)
GLUCOSE: 98 mg/dL (ref 65–99)
POTASSIUM: 3.5 mmol/L (ref 3.5–5.1)
Sodium: 141 mmol/L (ref 135–145)
Total Bilirubin: 0.3 mg/dL (ref 0.3–1.2)
Total Protein: 6.2 g/dL — ABNORMAL LOW (ref 6.5–8.1)

## 2015-03-06 LAB — CBC
HEMATOCRIT: 36.4 % (ref 36.0–46.0)
Hemoglobin: 11.7 g/dL — ABNORMAL LOW (ref 12.0–15.0)
MCH: 26.4 pg (ref 26.0–34.0)
MCHC: 32.1 g/dL (ref 30.0–36.0)
MCV: 82 fL (ref 78.0–100.0)
PLATELETS: 159 10*3/uL (ref 150–400)
RBC: 4.44 MIL/uL (ref 3.87–5.11)
RDW: 15.4 % (ref 11.5–15.5)
WBC: 3.7 10*3/uL — AB (ref 4.0–10.5)

## 2015-03-06 MED ORDER — GADOBENATE DIMEGLUMINE 529 MG/ML IV SOLN
20.0000 mL | Freq: Once | INTRAVENOUS | Status: AC | PRN
  Administered 2015-03-06: 20 mL via INTRAVENOUS

## 2015-03-06 NOTE — Consult Note (Signed)
   [facility] CM Inpatient Consult   03/06/2015  [name] [birth year] [id number]   Patient evaluated for [place] Management services. Met with patient at bedside to explain [facility] services. She does not have any [facility] Care Management needs at this time. Accepted [facility] brochure and contact information to call in future if needed. Appreciative of visit. Made inpatient RNCM aware.  Marthenia Rolling, MSN-Ed, RN,BSN [facility] Liaison [phone]

## 2015-03-06 NOTE — Progress Notes (Signed)
[facility] PROGRESS NOTE Subjective patient continues to feel fine as long as she doesn't eat. As soon as she eats she feels nauseated environments. She is for upper G.I. today.  Objective: Vital signs in last 24 hours: Temp:  [98.1 F (36.7 C)] 98.1 F (36.7 C) (11/07 0628) Pulse Rate:  [51-90] 51 (11/07 0628) Resp:  [18-22] 22 (11/07 0628) BP: (117-156)/(65-88) 129/84 mmHg (11/07 0628) SpO2:  [98 %-100 %] 98 % (11/07 0628) Weight:  [115.894 kg (255 lb 8 oz)] 115.894 kg (255 lb 8 oz) (11/07 0628) Last BM Date: 03/02/15  Intake/Output from previous day: 11/06 0701 - 11/07 0700 In: 300 [P.O.:300] Out: -  Intake/Output this shift:    PE:  Abdomen-- soft nontender  Lab Results:  Recent Labs  03/06/15 0450  WBC 3.7*  HGB 11.7*  HCT 36.4  PLT 159   BMET  Recent Labs  03/06/15 0450  NA 141  K 3.5  CL 108  CO2 29  CREATININE 1.21*   LFT  Recent Labs  03/06/15 0450  PROT 6.2*  AST 18  ALT 8*  ALKPHOS 50  BILITOT 0.3   PT/INR No results for input(s): LABPROT, INR in the last 72 hours. PANCREAS No results for input(s): LIPASE in the last 72 hours.       Studies/Results: Mr Abdomen W Wo Contrast  03/06/2015  CLINICAL DATA:  Recurrent nausea and vomiting for several months. Unable eat for 5 days. Mildly dilated pancreatic duct on ultrasound. MRI recommended for further evaluation. EXAM: MRI ABDOMEN WITHOUT AND WITH CONTRAST TECHNIQUE: Multiplanar multisequence MR imaging of the abdomen was performed both before and after the administration of intravenous contrast. CONTRAST:  20mL MULTIHANCE GADOBENATE DIMEGLUMINE 529 MG/ML IV SOLN COMPARISON:  Ultrasound 03/05/2015 FINDINGS: Lower chest:  Trace bilateral pleural effusions. Hepatobiliary: Normal hepatic signal intensity. No significant hepatic cyst steatosis noted on opposed phase imaging. No focal lesion. No intrahepatic biliary duct dilatation. The gallbladder is normal. No calculi identified.  The common bile duct is normal caliber. Pancreas: The pancreatic duct is felt to be within normal limits of the body and tail measuring 2.7 mm. There is some mild dilatation the through the pancreatic head just proximal to the ampulla. There is a small cystic lesion adjacent to the distal pancreatic duct measuring 5 mm on image 94, series 7). There is a second small cystic lesion also in the head of the pancreas but more removed from the pancreatic duct measuring 6 mm on image 80, series 7. These 2 lesions are also well demonstrated on image 83, series 700. The pancreatic parenchyma itself appears normal without evidence of abnormal enhancement. Spleen: Normal spleen. Adrenals/urinary tract: Several small nonenhancing cysts within the renal parenchyma are less than 1 cm. Stomach/Bowel: Stomach and limited of the small bowel is unremarkable Vascular/Lymphatic: Abdominal aortic normal caliber. No retroperitoneal periportal lymphadenopathy. Musculoskeletal: No aggressive osseous lesion IMPRESSION: 1. Mild dilatation of the pancreatic duct is felt within normal limits. No pancreatic mass lesion. 2. 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 Recommend follow-up MRI with without contrast in 12 months. This recommendation follows ACR consensus guidelines: Managing Incidental Findings on Abdominal CT: White Paper of the ACR Incidental Findings Committee. J Am Coll Radiol 2010;7:754-773 3. Bilateral small benign Bosniak 1 renal cysts 4. Normal liver parenchyma. Electronically Signed   By: Genevive BiStewart  Edmunds M.D.   On: 03/06/2015 07:37  US Abdomen Complete  03/05/2015  CLINICAL DATA:  Nausea and vomiting. EXAM: ULTRASOUND ABDOMEN COMPLETE COMPARISON:  10/14/2013 abdominal sonogram. FINDINGS: Gallbladder: No gallstones or wall thickening visualized. No sonographic Murphy sign noted. Common bile  duct: Diameter: 5 mm Liver: Liver parenchyma is diffusely mildly echogenic with mild posterior acoustic attenuation, suggesting mild diffuse hepatic steatosis. No liver mass is detected, noting decreased sensitivity in the setting of an echogenic liver. No liver surface irregularity is demonstrated. IVC: No abnormality visualized. Pancreas: There is borderline dilatation of the main pancreatic duct (3 mm diameter, previously 2 mm). No mass in the visualized pancreas, noted limited visualization of the pancreatic head and tail due to overlying bowel gas. Spleen: Size and appearance within normal limits. Right Kidney: Length: 10.4 cm. Echogenicity within normal limits. No mass or hydronephrosis visualized. Left Kidney: Length: 10.6 cm. Echogenicity within normal limits. No mass or hydronephrosis visualized. Abdominal aorta: No aneurysm visualized. Other findings: None. IMPRESSION: 1. Borderline dilated main pancreatic duct. Recommend CT abdomen with intravenous contrast or MRI abdomen with and without intravenous contrast to exclude a pancreatic head mass. 2. Mild diffuse hepatic steatosis. 3. Otherwise normal abdominal sonogram, with no cholelithiasis. Electronically Signed   By: Delbert Phenix M.D.   On: 03/05/2015 14:28    Medications: I have reviewed the patient's current medications.  Assessment/Plan: 1. Post perennial nausea and vomiting. She has had EGD a year ago for similar symptoms with no etiology determined. Ultrasound of the gallbladder on this admission was normal there was diffuse hepatic steatosis. MRCP did not show any abnormalities of the pancreas other than slightly dilated pancreatic duct and small cysts. For UGI today.   Dellas Guard JR,Willard Farquharson L 03/06/2015, 8:07 AM  Pager: [phone] If no answer or after hours call [phone]

## 2015-03-06 NOTE — Progress Notes (Signed)
MRI notified the RN that they were going to pick the patient up from her room. RN notified the PCP to get order to have patient travel with out telemetry  Awaiting any new orders.

## 2015-03-06 NOTE — Progress Notes (Signed)
[facility] PROGRESS NOTE  [name] ZOX:[id number] DOB: 27-May-1945 DOA: 03/02/2015 PCP: Gwynneth Aliment, MD  Assessment/Plan: 1. Syncope- ? Cause, cardiac enzymes 3 negative, echocardiogram done shows no significant abnormality, orthostatic vital signs negative. Continue to monitor on telemetry.  patient will benefit from Holter monitoring as outpatient. 2. Nausea and vomiting- patient has recurrent nausea and vomiting going on for past one month. GI has seen the patient and ordered upper GI series. Upper GI series is pending. Continue Zofran when necessary for nausea and vomiting. Abdominal ultrasound done this morning showed 3 mm dilated pancreatic duct and MR I abdomen with and without contrast showed Mild dilatation of the pancreatic duct is felt within normal limits. No pancreatic mass lesion. 3. 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 Less likely asmall intraductal papillary mucinous tumor. Recommend follow-up MRI with without contrast in 12 months 4. Hypertension- blood pressure is stable, continue the home medications of Maxzide 25, losartan. 5. DVT prophylaxis- Lovenox  Code Status: Full code Family Communication: No family at bedside Disposition Plan: Home when medically stable   Consultants:  None  Procedures:  None  Antibiotics:  None  HPI/Subjective: 69 year old female who  has a past medical history of Hypertension; Hyperlipidemia; GERD (gastroesophageal reflux disease); and Arthritis. Today presents to the hospital after patient had episode of syncope. As per patient she has been feeling dizzy especially on eating up from sitting position for past few days. She also has been having episodes of vomiting whenever she eats something. She ate this morning after that she started throwing up then patient went to her house and when she  got out of the car and started to walk to her home as per patient's friend she passed out. Patient did not hit her head or face has friend was able to break the fall. Patient did not respond for a few minutes with eyes rolled back. After patient's son came out when patient began to come around. There was no witnessed seizure-like activity, patient denies chest pain or shortness of breath. No headache or blurred vision. Denies dysuria urgency or frequency of urination. In the ED lab work did not show significant abnormality. EKG showed bradycardia with prolonged PR interval 144 ms  This morning patient continues to have nausea after eating. Upper GI series is still pending. Abdominal ultrasound was done this morning which showed dilated pancreatic duct. MRI abdomen did not show significant abnormality.  Objective: Filed Vitals:   03/06/15 0628  BP: 129/84  Pulse: 51  Temp: 98.1 F (36.7 C)  Resp: 22    Intake/Output Summary (Last 24 hours) at 03/06/15 1051 Last data filed at 03/06/15 0807  Gross per 24 hour  Intake    240 ml  Output      0 ml  Net    240 ml   Filed Weights   03/02/15 1802 03/03/15 0622 03/06/15 0628  Weight: 117 kg (257 lb 15 oz) 116.529 kg (256 lb 14.4 oz) 115.894 kg (255 lb 8 oz)    Exam:   General:  Appears in no acute distress  Cardiovascular: S1-S2 regular  Respiratory: Clear to auscultation bilaterally  Abdomen: Soft, positive right upper quadrant tenderness to palpation , no organomegaly  Musculoskeletal: No cyanosis/clubbing/edema of the lower extremities   Data Reviewed: Basic Metabolic Panel:  Recent Labs Lab 03/02/15 1409 03/03/15 0536 03/06/15 0450  NA 138 141 141  K 3.6 3.6 3.5  CL 103 105 108  CO2 28 27 29   GLUCOSE 127* 94 98  BUN 14 11 7   CREATININE 1.04* 1.11* 1.21*  CALCIUM 9.0 8.7* 8.4*   Liver Function Tests:  Recent Labs Lab 03/02/15 1409 03/03/15 0536 03/06/15 0450  AST 17 12* 18  ALT 8* 7* 8*  ALKPHOS 57 50 50   BILITOT 0.6 0.9 0.3  PROT 7.1 6.3* 6.2*  ALBUMIN 3.9 3.6 3.3*    Recent Labs Lab 03/02/15 1409  LIPASE 54*   No results for input(s): AMMONIA in the last 168 hours. CBC:  Recent Labs Lab 03/02/15 1409 03/03/15 0536 03/06/15 0450  WBC 3.4* 4.2 3.7*  HGB 12.2 11.9* 11.7*  HCT 38.0 37.0 36.4  MCV 80.9 81.7 82.0  PLT 196 174 159   Cardiac Enzymes:  Recent Labs Lab 03/02/15 1409 03/02/15 2005 03/03/15 0012 03/03/15 0536  TROPONINI <0.03 <0.03 <0.03 <0.03    CBG:  Recent Labs Lab 03/03/15 0741 03/03/15 1653 03/04/15 0724 03/05/15 0521 03/06/15 0619  GLUCAP 90 99 82 85 72    No results found for this or any previous visit (from the past 240 hour(s)).   Studies: Mr Abdomen W Wo Contrast  03/06/2015  CLINICAL DATA:  Recurrent nausea and vomiting for several months. Unable eat for 5 days. Mildly dilated pancreatic duct on ultrasound. MRI recommended for further evaluation. EXAM: MRI ABDOMEN WITHOUT AND WITH CONTRAST TECHNIQUE: Multiplanar multisequence MR imaging of the abdomen was performed both before and after the administration of intravenous contrast. CONTRAST:  20mL MULTIHANCE GADOBENATE DIMEGLUMINE 529 MG/ML IV SOLN COMPARISON:  Ultrasound 03/05/2015 FINDINGS: Lower chest:  Trace bilateral pleural effusions. Hepatobiliary: Normal hepatic signal intensity. No significant hepatic cyst steatosis noted on opposed phase imaging. No focal lesion. No intrahepatic biliary duct dilatation. The gallbladder is normal. No calculi identified. The common bile duct is normal caliber. Pancreas: The pancreatic duct is felt to be within normal limits of the body and tail measuring 2.7 mm. There is some mild dilatation the through the pancreatic head just proximal to the ampulla. There is a small cystic lesion adjacent to the distal pancreatic duct measuring 5 mm on image 94, series 7). There is a second small cystic lesion also in the head of the pancreas but more removed from the  pancreatic duct measuring 6 mm on image 80, series 7. These 2 lesions are also well demonstrated on image 83, series 700. The pancreatic parenchyma itself appears normal without evidence of abnormal enhancement. Spleen: Normal spleen. Adrenals/urinary tract: Several small nonenhancing cysts within the renal parenchyma are less than 1 cm. Stomach/Bowel: Stomach and limited of the small bowel is unremarkable Vascular/Lymphatic: Abdominal aortic normal caliber. No retroperitoneal periportal lymphadenopathy. Musculoskeletal: No aggressive osseous lesion IMPRESSION: 1. Mild dilatation of the pancreatic duct is felt within normal limits. No pancreatic mass lesion. 2. 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 Recommend follow-up MRI with without contrast in 12 months. This recommendation follows ACR consensus guidelines: Managing Incidental Findings on Abdominal CT: White Paper of the ACR Incidental Findings Committee. J Am Coll Radiol 2010;7:754-773 3. Bilateral small benign Bosniak 1 renal cysts 4. Normal liver parenchyma. Electronically Signed   By: Genevive BiStewart  Edmunds M.D.   On: 03/06/2015 07:37   [place] Abdomen Complete  03/05/2015  CLINICAL DATA:  Nausea and vomiting. EXAM: ULTRASOUND ABDOMEN COMPLETE COMPARISON:  10/14/2013 abdominal sonogram.  FINDINGS: Gallbladder: No gallstones or wall thickening visualized. No sonographic Murphy sign noted. Common bile duct: Diameter: 5 mm Liver: Liver parenchyma is diffusely mildly echogenic with mild posterior acoustic attenuation, suggesting mild diffuse hepatic steatosis. No liver mass is detected, noting decreased sensitivity in the setting of an echogenic liver. No liver surface irregularity is demonstrated. IVC: No abnormality visualized. Pancreas: There is borderline dilatation of the main pancreatic duct (3 mm diameter, previously 2 mm). No mass  in the visualized pancreas, noted limited visualization of the pancreatic head and tail due to overlying bowel gas. Spleen: Size and appearance within normal limits. Right Kidney: Length: 10.4 cm. Echogenicity within normal limits. No mass or hydronephrosis visualized. Left Kidney: Length: 10.6 cm. Echogenicity within normal limits. No mass or hydronephrosis visualized. Abdominal aorta: No aneurysm visualized. Other findings: None. IMPRESSION: 1. Borderline dilated main pancreatic duct. Recommend CT abdomen with intravenous contrast or MRI abdomen with and without intravenous contrast to exclude a pancreatic head mass. 2. Mild diffuse hepatic steatosis. 3. Otherwise normal abdominal sonogram, with no cholelithiasis. Electronically Signed   By: Delbert Phenix M.D.   On: 03/05/2015 14:28    Scheduled Meds: . enoxaparin (LOVENOX) injection  60 mg Subcutaneous Q24H  . feeding supplement  1 Container Oral TID BM  . losartan  100 mg Oral Daily  . multivitamin  5 mL Oral Daily  . pantoprazole  40 mg Oral Daily  . simvastatin  20 mg Oral QPM  . sodium chloride  3 mL Intravenous Q12H  . triamterene-hydrochlorothiazide  1 tablet Oral Daily   Continuous Infusions: . sodium chloride 100 mL/hr at 03/06/15 0402    Active Problems:   Essential hypertension   Syncope and collapse   Syncope   Nausea and vomiting    Time spent: 25 min    [facility] S  [facility] Pager [phone]. If 7PM-7AM, please contact night-coverage at [email or web address], password [facility] 03/06/2015, 10:51 AM  LOS: 2 days

## 2015-03-06 NOTE — Progress Notes (Signed)
Patient HR sustaining in the 50's today. MD aware, will continue to monitor closely.

## 2015-03-07 DIAGNOSIS — G43A0 Cyclical vomiting, not intractable: Secondary | ICD-10-CM

## 2015-03-07 MED ORDER — SUCRALFATE 1 GM/10ML PO SUSP
1.0000 g | Freq: Three times a day (TID) | ORAL | Status: DC
  Administered 2015-03-07 – 2015-03-09 (×6): 1 g via ORAL
  Filled 2015-03-07 (×6): qty 10

## 2015-03-07 MED ORDER — BOOST / RESOURCE BREEZE PO LIQD
1.0000 | Freq: Every morning | ORAL | Status: DC
  Administered 2015-03-09: 1 via ORAL

## 2015-03-07 NOTE — Progress Notes (Signed)
[facility] PROGRESS NOTE  [name]:[id number] DOB: 16-Jun-1945 DOA: 03/02/2015 PCP: Gwynneth Aliment, MD  Assessment/Plan: 1. Syncope- ? Cause, cardiac enzymes 3 negative, echocardiogram done shows no significant abnormality, orthostatic vital signs negative. Continue to monitor on telemetry.  patient will benefit from Holter monitoring as outpatient. Called cardiology to set up outpatient Holter monitoring. They will contact the patient after discharge. 2. Bradycardia- patient continues to have bradycardia and is asymptomatic. Holter monitoring to be set up as outpatient. 3. Nausea and vomiting- patient has recurrent nausea and vomiting going on for past one month. GI has seen the patient and ordered upper GI series. Upper GI series came out negative.  Continue Zofran  when necessary for nausea and vomiting. Abdominal ultrasound showed 3 mm dilated pancreatic duct and MR I abdomen with and without contrast showed Mild dilatation of the pancreatic duct is felt within normal limits. No pancreatic mass lesion. Gastric emptying study ordered per GI. Continue sucralfate, Protonix 40 mg by mouth daily 4. 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 Recommend follow-up MRI with without contrast in 12 months 5. Hypertension- blood pressure is stable, continue the home medications of Maxzide 25, losartan. 6. DVT prophylaxis- Lovenox  Code Status: Full code Family Communication: No family at bedside Disposition Plan: Home when medically stable   Consultants:  None  Procedures:  Echocardiogram  Abdominal ultrasound  Antibiotics:  None  HPI/Subjective: 69 year old female who  has a past medical history of Hypertension; Hyperlipidemia; GERD (gastroesophageal reflux disease); and Arthritis. Today  presents to the hospital after patient had episode of syncope. As per patient she has been feeling dizzy especially on eating up from sitting position for past few days. She also has been having episodes of vomiting whenever she eats something. She ate this morning after that she started throwing up then patient went to her house and when she got out of the car and started to walk to her home as per patient's friend she passed out. Patient did not hit her head or face has friend was able to break the fall. Patient did not respond for a few minutes with eyes rolled back. After patient's son came out when patient began to come around. There was no witnessed seizure-like activity, patient denies chest pain or shortness of breath. No headache or blurred vision. Denies dysuria urgency or frequency of urination. In the ED lab work did not show significant abnormality.EKG showed bradycardia with prolonged PR interval 144 ms During the hospital stay Patient continued to have nausea and vomiting after eating so adominal ultrasound was done which showed dilated pancreatic duct. MRI abdomen did not show significant abnormality. Upper GI series showed no significant abnormality  This morning patient continues to have nausea, vomiting after eating.  Objective: Filed Vitals:   03/07/15 1415  BP: 148/74  Pulse: 55  Temp: 98 F (36.7 C)  Resp: 20    Intake/Output Summary (Last 24 hours) at 03/07/15 1532 Last data filed at 03/07/15 1400  Gross per 24 hour  Intake   5280 ml  Output      0 ml  Net   5280 ml   Filed Weights   03/03/15 0622 03/06/15 0628 03/07/15 0510  Weight: 116.529 kg (256 lb 14.4 oz) 115.894 kg (255 lb 8 oz) 115.35 kg (254 lb 4.8 oz)    Exam:  General:  Appears in no acute distress  Cardiovascular: S1-S2 regular  Respiratory: Clear to auscultation bilaterally  Abdomen: Soft, positive right upper quadrant tenderness to palpation , no organomegaly  Musculoskeletal: No  cyanosis/clubbing/edema of the lower extremities   Data Reviewed: Basic Metabolic Panel:  Recent Labs Lab 03/02/15 1409 03/03/15 0536 03/06/15 0450  NA 138 141 141  K 3.6 3.6 3.5  CL 103 105 108  CO2 28 27 29   GLUCOSE 127* 94 98  BUN 14 11 7   CREATININE 1.04* 1.11* 1.21*  CALCIUM 9.0 8.7* 8.4*   Liver Function Tests:  Recent Labs Lab 03/02/15 1409 03/03/15 0536 03/06/15 0450  AST 17 12* 18  ALT 8* 7* 8*  ALKPHOS 57 50 50  BILITOT 0.6 0.9 0.3  PROT 7.1 6.3* 6.2*  ALBUMIN 3.9 3.6 3.3*    Recent Labs Lab 03/02/15 1409  LIPASE 54*   No results for input(s): AMMONIA in the last 168 hours. CBC:  Recent Labs Lab 03/02/15 1409 03/03/15 0536 03/06/15 0450  WBC 3.4* 4.2 3.7*  HGB 12.2 11.9* 11.7*  HCT 38.0 37.0 36.4  MCV 80.9 81.7 82.0  PLT 196 174 159   Cardiac Enzymes:  Recent Labs Lab 03/02/15 1409 03/02/15 2005 03/03/15 0012 03/03/15 0536  TROPONINI <0.03 <0.03 <0.03 <0.03    CBG:  Recent Labs Lab 03/03/15 0741 03/03/15 1653 03/04/15 0724 03/05/15 0521 03/06/15 0619  GLUCAP 90 99 82 85 72    No results found for this or any previous visit (from the past 240 hour(s)).   Studies: Mr Abdomen W Wo Contrast  03/06/2015  CLINICAL DATA:  Recurrent nausea and vomiting for several months. Unable eat for 5 days. Mildly dilated pancreatic duct on ultrasound. MRI recommended for further evaluation. EXAM: MRI ABDOMEN WITHOUT AND WITH CONTRAST TECHNIQUE: Multiplanar multisequence MR imaging of the abdomen was performed both before and after the administration of intravenous contrast. CONTRAST:  20mL MULTIHANCE GADOBENATE DIMEGLUMINE 529 MG/ML IV SOLN COMPARISON:  Ultrasound 03/05/2015 FINDINGS: Lower chest:  Trace bilateral pleural effusions. Hepatobiliary: Normal hepatic signal intensity. No significant hepatic cyst steatosis noted on opposed phase imaging. No focal lesion. No intrahepatic biliary duct dilatation. The gallbladder is normal. No calculi  identified. The common bile duct is normal caliber. Pancreas: The pancreatic duct is felt to be within normal limits of the body and tail measuring 2.7 mm. There is some mild dilatation the through the pancreatic head just proximal to the ampulla. There is a small cystic lesion adjacent to the distal pancreatic duct measuring 5 mm on image 94, series 7). There is a second small cystic lesion also in the head of the pancreas but more removed from the pancreatic duct measuring 6 mm on image 80, series 7. These 2 lesions are also well demonstrated on image 83, series 700. The pancreatic parenchyma itself appears normal without evidence of abnormal enhancement. Spleen: Normal spleen. Adrenals/urinary tract: Several small nonenhancing cysts within the renal parenchyma are less than 1 cm. Stomach/Bowel: Stomach and limited of the small bowel is unremarkable Vascular/Lymphatic: Abdominal aortic normal caliber. No retroperitoneal periportal lymphadenopathy. Musculoskeletal: No aggressive osseous lesion IMPRESSION: 1. Mild dilatation of the pancreatic duct is felt within normal limits. No pancreatic mass lesion. 2. 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 Recommend follow-up MRI with without contrast in 12 months. This recommendation follows ACR consensus guidelines: Managing Incidental Findings on Abdominal CT: White  Paper of the ACR Incidental Findings Committee. J Am Coll Radiol 2010;7:754-773 3. Bilateral small benign Bosniak 1 renal cysts 4. Normal liver parenchyma. Electronically Signed   By: Genevive Bi M.D.   On: 03/06/2015 07:37   Dg Ugi W/high Density W/kub  03/06/2015  CLINICAL DATA:  Nausea and vomiting, unexplained. EXAM: UPPER GI SERIES WITH KUB TECHNIQUE: After obtaining a scout radiograph a routine upper GI series was performed using thin and high density barium.  FLUOROSCOPY TIME:  Radiation Exposure Index (as provided by the fluoroscopic device): If the device does not provide the exposure index: Fluoroscopy Time (in minutes and seconds):  1 minutes 28 seconds. Number of Acquired Images:  11 COMPARISON:  MR abdomen 03/06/2015. FINDINGS: Scout view of the abdomen shows a normal bowel gas pattern. Postoperative changes in the lumbar spine. Single contrast examination of the upper gastrointestinal tract shows normal esophageal motility. No esophageal fold thickening, stricture or obstruction. Small hiatal hernia. Stomach is otherwise unremarkable. IMPRESSION: Small hiatal hernia. Electronically Signed   By: Leanna Battles M.D.   On: 03/06/2015 14:29    Scheduled Meds: . enoxaparin (LOVENOX) injection  60 mg Subcutaneous Q24H  . [START ON 03/08/2015] feeding supplement  1 Container Oral q morning - 10a  . losartan  100 mg Oral Daily  . pantoprazole  40 mg Oral Daily  . simvastatin  20 mg Oral QPM  . sodium chloride  3 mL Intravenous Q12H  . sucralfate  1 g Oral TID WC & HS  . triamterene-hydrochlorothiazide  1 tablet Oral Daily   Continuous Infusions: . sodium chloride 100 mL/hr at 03/07/15 0144    Active Problems:   Essential hypertension   Syncope and collapse   Syncope   Nausea and vomiting    Time spent: 25 min    [facility]  [facility] Pager [phone]. If 7PM-7AM, please contact night-coverage at [email or web address], password [facility] 03/07/2015, 3:32 PM  LOS: 3 days

## 2015-03-07 NOTE — Care Management Important Message (Signed)
Important Message  Patient Details  Name: [name] A Boak MRN: [id number] Date of Birth: 12/07/1945   Medicare Important Message Given:  [facility]    Haskell FlirtJamison, Asencion Guisinger 03/07/2015, 12:13 PMImportant Message  Patient Details  Name: [name] A Roop MRN: [id number] Date of Birth: 12/07/1945   Medicare Important Message Given:  Yes-second notification given    Haskell FlirtJamison, Leylani Duley 03/07/2015, 12:13 PM

## 2015-03-07 NOTE — Progress Notes (Signed)
Subjective: Nausea and vomiting persists (regurgitation after eating, food not digested, feels it makes it into the stomach). No improvement in past to several trials of PPI.  Objective: Vital signs in last 24 hours: Temp:  [97.4 F (36.3 C)-98.1 F (36.7 C)] 98.1 F (36.7 C) (11/08 0510) Pulse Rate:  [47-55] 50 (11/08 0510) Resp:  [18-20] 20 (11/08 0510) BP: (118-134)/(52-79) 129/79 mmHg (11/08 0510) SpO2:  [96 %-98 %] 98 % (11/08 0510) Weight:  [115.35 kg (254 lb 4.8 oz)] 115.35 kg (254 lb 4.8 oz) (11/08 0510) Weight change: -0.544 kg (-1 lb 3.2 oz) Last BM Date: 03/02/15 (pt not eating)  PE: GEN:  Obese, NAD  Lab Results: CBC    Component Value Date/Time   WBC 3.7* 03/06/2015 0450   RBC 4.44 03/06/2015 0450   HGB 11.7* 03/06/2015 0450   HCT 36.4 03/06/2015 0450   PLT 159 03/06/2015 0450   MCV 82.0 03/06/2015 0450   MCH 26.4 03/06/2015 0450   MCHC 32.1 03/06/2015 0450   RDW 15.4 03/06/2015 0450   LYMPHSABS 0.6* 05/30/2014 1747   MONOABS 0.6 05/30/2014 1747   EOSABS 0.0 05/30/2014 1747   BASOSABS 0.0 05/30/2014 1747   CMP     Component Value Date/Time   NA 141 03/06/2015 0450   K 3.5 03/06/2015 0450   CL 108 03/06/2015 0450   CO2 29 03/06/2015 0450   GLUCOSE 98 03/06/2015 0450   BUN 7 03/06/2015 0450   CREATININE 1.21* 03/06/2015 0450   CALCIUM 8.4* 03/06/2015 0450   PROT 6.2* 03/06/2015 0450   ALBUMIN 3.3* 03/06/2015 0450   AST 18 03/06/2015 0450   ALT 8* 03/06/2015 0450   ALKPHOS 50 03/06/2015 0450   BILITOT 0.3 03/06/2015 0450   GFRNONAA 45* 03/06/2015 0450   GFRAA 52* 03/06/2015 0450   Studies/Results: UGI series:  Small hiatal hernia, otherwise normal.  Assessment:  1.  Nausea and vomiting.  Wonder if GERD (with regurgitation) versus gastroparesis versus rumination versus functional process.  Plan:  1.  Sucralfate suspension. 2.  Gastric emptying study tomorrow. 3.  She's apparently dealt with these symptoms for several months, doesn't  appear to have lost much weight, thus could likely discharge her home tomorrow with close outpatient follow-up. 4.  Will follow.   Freddy JakschOUTLAW,Pio Eatherly M 03/07/2015, 12:58 PM   Pager [phone] If no answer or after 5 PM call [phone]

## 2015-03-07 NOTE — Progress Notes (Signed)
Nutrition Follow-up  DOCUMENTATION CODES:   Morbid obesity  INTERVENTION:  - Continue Boost Breeze but will decrease to once/day for now - Encourage sips of fluids as tolerated - RD will continue to monitor for needs  NUTRITION DIAGNOSIS:   Inadequate oral intake related to poor appetite, vomiting as evidenced by energy intake < 75% for > or equal to 1 month. -ongoing  GOAL:   Patient will meet greater than or equal to 90% of their needs -unmet  MONITOR:   Diet advancement, PO intake, Supplement acceptance, Labs, Weight trends  ASSESSMENT:   69 year old female who  has a past medical history of Hypertension; Hyperlipidemia; GERD (gastroesophageal reflux disease); and Arthritis.  11/8 Pt has mainly been refusing intakes since assessment 11/4. Pending UGI results and GI note from 1258 today states that gastric emptying study to be done 11/9 and that pt is a possible d/c 11/9 with her to follow-up as an outpatient.  Pt reports that she has been having ongoing nausea and that with intakes she continues to have nausea and vomiting. She states even the sight of food elicits this response. She plans to consume liquids for dinner tonight and states "if it comes up then it comes up but I am going to try."  Not meeting needs. Medications reviewed. Labs reviewed; CBGs: 72-124 mg/dL, creatinine elevated, Ca: 8.4 mg/dL, GFR: 52.   16/111/4 - Patient reports positive for poor appetite, nausea, vomiting and abdominal pain. Per  - Nurse states that a GI consult is pending.  - Patient describes symptoms started happening about 1 month prior. Since that time she has been eating very little due to poor tolerance.  - She states her usual weight is 270 lbs greater than 1 year ago; she is currently 256 lbs (5% weight loss; which is non-significant for the timeframe).  - She is unable to describe if any foods in particular are increasing symptoms, but describes an incident following eating a rich,  cream-cheese cake.  - Patient describes some recent taste and smell changes, and reports her normal foods just don't taste the same.  - Boost Breeze supplement was at bedside. Patient reported willingness to try it, but she is intentionally limiting intake because she was afraid of vomiting. - Encouraged patient to continue to try drinking Boost if she as much as she is able to tolerate.  - NFPE performed - patient has mild fat wasting - orbital's; but otherwise is well nourished.   Diet Order:  Diet NPO time specified  Skin:  Reviewed, no issues  Last BM:  11/3  Height:   Ht Readings from Last 1 Encounters:  03/02/15 5\' 4"  (1.626 m)    Weight:   Wt Readings from Last 1 Encounters:  03/07/15 254 lb 4.8 oz (115.35 kg)    Ideal Body Weight:  54.5 kg  BMI:  Body mass index is 43.63 kg/(m^2).  Estimated Nutritional Needs:   Kcal:  1750-2050 kcal  Protein:  115-125 gm  Fluid:  1.7-2 L/day  EDUCATION NEEDS:   Education needs no appropriate at this time     Trenton GammonJessica Izyk Marty, RD, LDN Inpatient Clinical Dietitian Pager # [phone] After hours/weekend pager # [phone]

## 2015-03-08 ENCOUNTER — Inpatient Hospital Stay (HOSPITAL_COMMUNITY): Payer: Commercial Managed Care - HMO

## 2015-03-08 DIAGNOSIS — I10 Essential (primary) hypertension: Secondary | ICD-10-CM

## 2015-03-08 DIAGNOSIS — R001 Bradycardia, unspecified: Secondary | ICD-10-CM

## 2015-03-08 DIAGNOSIS — K219 Gastro-esophageal reflux disease without esophagitis: Secondary | ICD-10-CM

## 2015-03-08 DIAGNOSIS — R55 Syncope and collapse: Principal | ICD-10-CM

## 2015-03-08 DIAGNOSIS — R1110 Vomiting, unspecified: Secondary | ICD-10-CM

## 2015-03-08 DIAGNOSIS — E876 Hypokalemia: Secondary | ICD-10-CM | POA: Diagnosis present

## 2015-03-08 LAB — CBC
HCT: 37 % (ref 36.0–46.0)
Hemoglobin: 12.1 g/dL (ref 12.0–15.0)
MCH: 26.4 pg (ref 26.0–34.0)
MCHC: 32.7 g/dL (ref 30.0–36.0)
MCV: 80.6 fL (ref 78.0–100.0)
PLATELETS: 172 10*3/uL (ref 150–400)
RBC: 4.59 MIL/uL (ref 3.87–5.11)
RDW: 15.4 % (ref 11.5–15.5)
WBC: 3.5 10*3/uL — AB (ref 4.0–10.5)

## 2015-03-08 LAB — COMPREHENSIVE METABOLIC PANEL
ALBUMIN: 3.4 g/dL — AB (ref 3.5–5.0)
ALT: 12 U/L — ABNORMAL LOW (ref 14–54)
AST: 29 U/L (ref 15–41)
Alkaline Phosphatase: 51 U/L (ref 38–126)
Anion gap: 6 (ref 5–15)
BUN: 6 mg/dL (ref 6–20)
CHLORIDE: 107 mmol/L (ref 101–111)
CO2: 28 mmol/L (ref 22–32)
Calcium: 8.7 mg/dL — ABNORMAL LOW (ref 8.9–10.3)
Creatinine, Ser: 0.92 mg/dL (ref 0.44–1.00)
GFR calc Af Amer: >60 mL/min (ref >60)
GLUCOSE: 92 mg/dL (ref 65–99)
POTASSIUM: 3.2 mmol/L — AB (ref 3.5–5.1)
Sodium: 141 mmol/L (ref 135–145)
Total Bilirubin: 0.7 mg/dL (ref 0.3–1.2)
Total Protein: 6.5 g/dL (ref 6.5–8.1)

## 2015-03-08 LAB — GLUCOSE, CAPILLARY: Glucose-Capillary: 74 mg/dL (ref 65–99)

## 2015-03-08 LAB — MAGNESIUM: MAGNESIUM: 2 mg/dL (ref 1.7–2.4)

## 2015-03-08 MED ORDER — TECHNETIUM TC 99M SULFUR COLLOID
2.0000 | Freq: Once | INTRAVENOUS | Status: DC | PRN
  Administered 2015-03-08: 2 via INTRAVENOUS
  Filled 2015-03-08: qty 2

## 2015-03-08 MED ORDER — POTASSIUM CHLORIDE CRYS ER 20 MEQ PO TBCR
40.0000 meq | EXTENDED_RELEASE_TABLET | Freq: Once | ORAL | Status: AC
  Administered 2015-03-08: 40 meq via ORAL
  Filled 2015-03-08: qty 2

## 2015-03-08 MED ORDER — POLYETHYLENE GLYCOL 3350 17 G PO PACK
17.0000 g | PACK | Freq: Every day | ORAL | Status: DC
  Administered 2015-03-08 – 2015-03-09 (×2): 17 g via ORAL
  Filled 2015-03-08 (×2): qty 1

## 2015-03-08 MED ORDER — HYDROCODONE-ACETAMINOPHEN 5-325 MG PO TABS
1.0000 | ORAL_TABLET | ORAL | Status: DC | PRN
  Administered 2015-03-08: 1 via ORAL
  Filled 2015-03-08: qty 1

## 2015-03-08 MED ORDER — POTASSIUM CHLORIDE 10 MEQ/100ML IV SOLN
10.0000 meq | INTRAVENOUS | Status: DC
  Administered 2015-03-08: 10 meq via INTRAVENOUS
  Filled 2015-03-08: qty 100

## 2015-03-08 MED ORDER — HYDRALAZINE HCL 20 MG/ML IJ SOLN: 10.0000 mg | INTRAMUSCULAR | Status: DC | PRN

## 2015-03-08 MED ORDER — POTASSIUM CHLORIDE 20 MEQ PO PACK: 40.0000 meq | PACK | Freq: Once | ORAL | Status: DC

## 2015-03-08 MED ORDER — PANTOPRAZOLE SODIUM 40 MG PO TBEC
40.0000 mg | DELAYED_RELEASE_TABLET | Freq: Two times a day (BID) | ORAL | Status: DC
  Administered 2015-03-08 – 2015-03-09 (×2): 40 mg via ORAL
  Filled 2015-03-08 (×2): qty 1

## 2015-03-08 NOTE — Progress Notes (Signed)
[facility] Progress Note    Patient: [name]  ZOX:[id number]  DOB: 06-07-1945  DOA: 03/02/2015 Date of Service: the patient was seen and examined on 03/08/2015  Subjective: Patient was seen after gastric emptying study Continues to have nausea and one episode of vomiting after drinking ensure. Does not have any dizziness or lightheadedness no chest pain. Bowel movements are small but daily, complaining of mild abdominal pain Nutrition: Was nothing by mouth last night  Brief Summary of Hospitalization: Day 4 of admission Patient presented with syncope with collapse, workup so far is unremarkable. Also developed nausea and vomiting GI is following.  Assessment and Plan 1. Syncope and collapse bradycardia,  Oximetry vitals are negative, echo cardiogram shows no identifiable cause, telemetry monitoring shows bradycardia without any acute events, cardiology is consulted and will be contacted the patient is an outpatient for Holter monitoring. We will ambulate her in the hospital prior to discharge.  2. Nausea and vomiting Continues to have nausea and vomiting, Upper GI series shows small hiatal hernia and no abnormality Gastric emptying study shows normal motility GI is consulted, and since it does not appear that the patient has lost significant weight increase patient can be discharged home with close outpatient follow-up. Since the patient continues to have symptoms of nausea and vomiting I will change her PPI to twice a day, continue Carafate, small frequent meals, We'll monitor her intake tomorrow, and decide about discharge.  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  Recommend follow-up MRI with and without contrast in 12 months. Patient will also follow-up with GI as an outpatient.  4. Hypokalemia Potassium is trending  down today. We will replace orally and monitor.  5. Essential hypertension. Blood pressure currently stable. Continue losartan.  DVT Prophylaxis: subcutaneous Heparin Nutrition: Advance as tolerated regular diet   Advance goals of care discussion: Full code  Disposition:  Discharge in one day pending adequate oral intake, to home.  Consultants: Gastroenterology  Procedures: Upper GI series Gastric emptying Echocardiogram   Intake/Output Summary (Last 24 hours) at 03/08/15 1300 Last data filed at 03/08/15 0700  Gross per 24 hour  Intake 2531.67 ml  Output      0 ml  Net 2531.67 ml   Filed Weights   03/06/15 0628 03/07/15 0510 03/08/15 0506  Weight: 115.894 kg (255 lb 8 oz) 115.35 kg (254 lb 4.8 oz) 114.261 kg (251 lb 14.4 oz)    Objective: Physical Exam: Filed Vitals:   03/07/15 1415 03/07/15 2203 03/08/15 0204 03/08/15 0506  BP: 148/74 136/73 137/60 146/85  Pulse: 55 56 52 60  Temp: 98 F (36.7 C) 98.3 F (36.8 C) 98.6 F (37 C) 98.5 F (36.9 C)  TempSrc: Oral Oral Oral Oral  Resp: Height:      Weight:    114.261 kg (251 lb 14.4 oz)  SpO2: 97% 94% 97%      General: Appear in mild distress Eyes: PERRl ENT: Oral Mucosa moist. Neck: no JVD Cardiovascular: S1 and S2 Present, no Murmur, Peripheral Pulses present Respiratory: Bilateral Air entry prsent and Clear to Auscultation, no Crackles, no wheezes Abdomen: Bowel Sound present, Soft and no tenderness Skin: no Rash Extremities: no Pedal edema, no calf tenderness Neurologic: Grossly no focal neuro deficit.  Data Reviewed: CBC:  Recent Labs Lab 03/02/15 1409 03/03/15 0536 03/06/15  0450 03/08/15 0500  WBC 3.4* 4.2 3.7* 3.5*  HGB 12.2 11.9* 11.7* 12.1  HCT 38.0 37.0 36.4 37.0  MCV 80.9 81.7 82.0 80.6  PLT 196 174 159 172   Basic Metabolic Panel:  Recent Labs Lab 03/02/15 1409 03/03/15 0536 03/06/15 0450 03/08/15 0500  NA 138 141 141 141  K 3.6 3.6 3.5 3.2*  CL 103 105 108  107  CO2 28 27 29 28   GLUCOSE 127* 94 98 92  BUN 14 11 7 6   CREATININE 1.04* 1.11* 1.21* 0.92  CALCIUM 9.0 8.7* 8.4* 8.7*  MG  --   --   --  2.0   Liver Function Tests:  Recent Labs Lab 03/02/15 1409 03/03/15 0536 03/06/15 0450 03/08/15 0500  AST 17 12* 18 29  ALT 8* 7* 8* 12*  ALKPHOS 57 50 50 51  BILITOT 0.6 0.9 0.3 0.7  PROT 7.1 6.3* 6.2* 6.5  ALBUMIN 3.9 3.6 3.3* 3.4*    Recent Labs Lab 03/02/15 1409  LIPASE 54*   No results for input(s): AMMONIA in the last 168 hours.  Cardiac Enzymes:  Recent Labs Lab 03/02/15 1409 03/02/15 2005 03/03/15 0012 03/03/15 0536  TROPONINI <0.03 <0.03 <0.03 <0.03   BNP (last 3 results) No results for input(s): BNP in the last 8760 hours.  ProBNP (last 3 results) No results for input(s): PROBNP in the last 8760 hours.  CBG:  Recent Labs Lab 03/03/15 1653 03/04/15 0724 03/05/15 0521 03/06/15 0619 03/08/15 1237  GLUCAP 99 82 85 72 74    No results found for this or any previous visit (from the past 240 hour(s)).   Studies: Nm Gastric Emptying  03/08/2015  CLINICAL DATA:  Nausea, vomiting and epigastric pain for several months. Subsequent encounter. EXAM: NUCLEAR MEDICINE GASTRIC EMPTYING SCAN TECHNIQUE: After oral ingestion of radiolabeled meal, sequential abdominal images were obtained for 4 hours. Percentage of activity emptying the stomach was calculated at 1 hour, 2 hour, 3 hour, and 4 hours. RADIOPHARMACEUTICALS:  2.0 mCi Tc-3271m MDP labeled sulfur colloid orally COMPARISON:  Upper GI series 03/06/2015. FINDINGS: Expected location of the stomach in the left upper quadrant. Ingested meal empties the stomach gradually over the course of the study. 16.6 emptied at 1 hr ( normal >= 10%) 32.0 emptied at 2 hr ( normal >= 40%) 49.2 emptied at 3 hr ( normal >= 70%) 88.1 emptied at 4 hr ( normal >= 90%) IMPRESSION: Normal gastric emptying through 3 hours with minimal delay noted at 4 hours. Electronically Signed   By: Drusilla Kannerhomas   Dalessio M.D.   On: 03/08/2015 12:39   Dg Ugi W/high Density W/kub  03/06/2015  CLINICAL DATA:  Nausea and vomiting, unexplained. EXAM: UPPER GI SERIES WITH KUB TECHNIQUE: After obtaining a scout radiograph a routine upper GI series was performed using thin and high density barium. FLUOROSCOPY TIME:  Radiation Exposure Index (as provided by the fluoroscopic device): If the device does not provide the exposure index: Fluoroscopy Time (in minutes and seconds):  1 minutes 28 seconds. Number of Acquired Images:  11 COMPARISON:  MR abdomen 03/06/2015. FINDINGS: Scout view of the abdomen shows a normal bowel gas pattern. Postoperative changes in the lumbar spine. Single contrast examination of the upper gastrointestinal tract shows normal esophageal motility. No esophageal fold thickening, stricture or obstruction. Small hiatal hernia. Stomach is otherwise unremarkable. IMPRESSION: Small hiatal hernia. Electronically Signed   By: Leanna BattlesMelinda  Blietz M.D.   On: 03/06/2015 14:29    Scheduled Meds: . enoxaparin (LOVENOX) injection  60 mg Subcutaneous Q24H  . feeding supplement  1 Container Oral q morning - 10a  . losartan  100 mg Oral Daily  . pantoprazole  40 mg Oral BID AC  . polyethylene glycol  17 g Oral Daily  . simvastatin  20 mg Oral QPM  . sodium chloride  3 mL Intravenous Q12H  . sucralfate  1 g Oral TID WC & HS   Time spent: 25 minutes.  Author: Lynden Oxford, MD [facility] Pager: [phone] 03/08/2015 1:00 PM  If 7PM-7AM, please contact night-coverage at [email or web address], password [facility]

## 2015-03-08 NOTE — Progress Notes (Signed)
Patient off floor getting gastric emptying study.  If she is able to eat ok without significant nausea or vomiting, OR if she is ready to go home (she's had these symptoms supposedly at home for months), she can be discharged home today after her GES is completed.  If she remains in hospital, I will see her back tomorrow.

## 2015-03-09 ENCOUNTER — Encounter (HOSPITAL_COMMUNITY): Payer: Self-pay | Admitting: Internal Medicine

## 2015-03-09 DIAGNOSIS — I5189 Other ill-defined heart diseases: Secondary | ICD-10-CM | POA: Diagnosis present

## 2015-03-09 LAB — CBC WITH DIFFERENTIAL/PLATELET
BASOS ABS: 0 10*3/uL (ref 0.0–0.1)
BASOS PCT: 0 %
EOS ABS: 0.3 10*3/uL (ref 0.0–0.7)
Eosinophils Relative: 5 %
HEMATOCRIT: 40.5 % (ref 36.0–46.0)
HEMOGLOBIN: 13.4 g/dL (ref 12.0–15.0)
Lymphocytes Relative: 48 %
Lymphs Abs: 2.5 10*3/uL (ref 0.7–4.0)
MCH: 26.4 pg (ref 26.0–34.0)
MCHC: 33.1 g/dL (ref 30.0–36.0)
MCV: 79.9 fL (ref 78.0–100.0)
MONOS PCT: 7 %
Monocytes Absolute: 0.4 10*3/uL (ref 0.1–1.0)
NEUTROS ABS: 2.1 10*3/uL (ref 1.7–7.7)
NEUTROS PCT: 40 %
Platelets: 208 10*3/uL (ref 150–400)
RBC: 5.07 MIL/uL (ref 3.87–5.11)
RDW: 15.4 % (ref 11.5–15.5)
WBC: 5.3 10*3/uL (ref 4.0–10.5)

## 2015-03-09 LAB — BASIC METABOLIC PANEL
ANION GAP: 9 (ref 5–15)
BUN: 10 mg/dL (ref 6–20)
CHLORIDE: 103 mmol/L (ref 101–111)
CO2: 28 mmol/L (ref 22–32)
CREATININE: 1.1 mg/dL — AB (ref 0.44–1.00)
Calcium: 9.5 mg/dL (ref 8.9–10.3)
GFR calc non Af Amer: 50 mL/min — ABNORMAL LOW (ref >60)
GFR, EST AFRICAN AMERICAN: 58 mL/min — AB (ref >60)
Glucose, Bld: 105 mg/dL — ABNORMAL HIGH (ref 65–99)
POTASSIUM: 3.7 mmol/L (ref 3.5–5.1)
SODIUM: 140 mmol/L (ref 135–145)

## 2015-03-09 MED ORDER — POLYETHYLENE GLYCOL 3350 17 G PO PACK: 17.0000 g | PACK | Freq: Every day | ORAL | Status: DC

## 2015-03-09 MED ORDER — SUCRALFATE 1 GM/10ML PO SUSP: 1.0000 g | Freq: Three times a day (TID) | ORAL | Status: DC

## 2015-03-09 MED ORDER — PANTOPRAZOLE SODIUM 40 MG PO TBEC: 40.0000 mg | DELAYED_RELEASE_TABLET | Freq: Two times a day (BID) | ORAL | Status: DC

## 2015-03-09 MED ORDER — ONDANSETRON HCL 4 MG PO TABS: 4.0000 mg | ORAL_TABLET | Freq: Four times a day (QID) | ORAL | Status: DC | PRN

## 2015-03-09 NOTE — Discharge Instructions (Signed)
Discharge Instructions: Discharge Diagnoses: gastroesophageal reflux disease  Recommendations for Outpatient Follow-up:  1. Take small frequent meals and take your medication a prescribed 2. Medications and dosages:dosage change: protonix twice a day before meals, begin: carafate  Diet recommendation: small frequent meals.  Activity: gradually increase your activity, take your time in changing your position from sitting to standing.  You were cared for by a hospitalist during your hospital stay. If you have any questions about your discharge medications or the care you received while you were in the hospital after you are discharged, you can call the unit and asked to speak with the hospitalist on call if the hospitalist that took care of you is not available. Once you are discharged, your primary care physician will handle any further medical issues. Please note that NO REFILLS for any discharge medications will be authorized once you are discharged, as it is imperative that you return to your primary care physician (or establish a relationship with a primary care physician if you do not have one) for your aftercare needs so that they can reassess your need for medications and monitor your lab values.

## 2015-03-09 NOTE — Progress Notes (Signed)
Pt previous shift report and patient statement she ate a regular dinner last night with no N/V.  Pt currently eating breakfast this morning and states she is going slow and is having no issues. Pt states she is ready to go home. Arthur Speagle A

## 2015-03-09 NOTE — Discharge Summary (Signed)
Discharge Summary [facility]   Patient: [name]   HQI:[id number]  DOB: 04/10/1946  PCP: Gwynneth Aliment, MD Date of admission: 03/02/2015  Date of discharge: No discharge date for patient encounter.  Discharge Diagnoses:  Principal Problem:   Syncope and collapse Active Problems:   Essential hypertension   GERD   Nausea and vomiting   Hypokalemia   Bradycardia  Recommendations for Outpatient Follow-up:  1. Take small frequent meals and take your medication a prescribed 2. Recommended the patient to discuss with her PCP if she has any further dizziness at home to adjust her Maxzide. 3. Medications and dosages:dosage change: protonix twice a day before meals, begin: carafate  Diet recommendation: small frequent meals.  Activity: gradually increase your activity, take your time in changing your position from sitting to standing.  Discharge Condition: stable  History of present illness: 69 year old female presenting with complaints of dizziness with passing out episode. This was preceded by episodes of nausea and vomiting. On 03/02/2015 the patient was admitted for further workup related to syncope.  Hospital Course:  1. Syncope and collapse bradycardia,  Repeated Orthostatic vitals remained negative,  echocardiogram shows no identifiable cause,  telemetry monitoring shows sinus bradycardia without any acute events,  cardiology is consulted over phone and they will contact the patient is an outpatient for Holter monitoring. Patient has admitted in the hospital multiple times without any episodes of syncope. I suspect this is most likely due to poor oral intake with continued use of her home antihypertensive medication. Recommended the patient to discuss with her PCP if she has any further dizziness at home to adjust her Maxzide.  2. Nausea and vomiting Significantly improved nausea, patient was able to tolerate 100% of her diet yesterday as well as on the day of  the discharge. Upper GI series shows small hiatal hernia and no abnormality. Gastric emptying study shows normal motility. GI is consulted, and since it does not appear that the patient has lost significant weight increase patient can be discharged home with close outpatient follow-up. Medication changes were PPI to twice a day, added Carafate, recommend small frequent meals.  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  Recommend follow-up MRI with and without contrast in 12 months. Patient will also follow-up with GI as an outpatient.  4. Hypokalemia Improved and resolved  5. Essential hypertension. Blood pressure currently stable. Continue losartan, Also resume Maxzide since the patient is now able to tolerate oral diet. Recommended the patient to discuss with her PCP if she has any further dizziness at home to adjust her Maxzide  Procedures and Results: Transthoracic Echocardiography Study Conclusions  - Left ventricle: The cavity size was normal. Wall thickness was increased in a pattern of mild LVH. Systolic function was normal. The estimated ejection fraction was in the range of 55% to 60%. Wall motion was normal; there were no regional wall motion abnormalities. Doppler parameters are consistent with abnormal left ventricular relaxation (grade 1 diastolic dysfunction).  Consultations:  Phone consultation with cardiology  Gastroenterology  Discharge Exam: Filed Weights   03/06/15 0628 03/07/15 0510 03/08/15 0506  Weight: 115.894 kg (255 lb 8 oz) 115.35 kg (254 lb 4.8 oz) 114.261 kg (251 lb 14.4 oz)   Filed Vitals:   03/09/15 0455  BP: 142/90  Pulse: 61  Temp: 97.4 F (36.3 C)  Resp: 18  General: Stable denies having any pain, alert awake and oriented in no acute distress Cardiovascular: S1 and S2 present,  no murmur Respiratory: Bilateral leg and represent an clear to auscultation GI: Bowel sounds are present no abdomen tenderness.  DISCHARGE MEDICATION: Discharge Instructions    Call MD for:  persistant dizziness or light-headedness    Complete by:  As directed      Call MD for:  persistant nausea and vomiting    Complete by:  As directed      Diet - low sodium heart healthy    Complete by:  As directed      Increase activity slowly    Complete by:  As directed           Current Discharge Medication List    START taking these medications   Details  ondansetron (ZOFRAN) 4 MG tablet Take 1 tablet (4 mg total) by mouth every 6 (six) hours as needed for nausea. Qty: 20 tablet, Refills: 0    polyethylene glycol (MIRALAX / GLYCOLAX) packet Take 17 g by mouth daily. Qty: 14 each, Refills: 0    sucralfate (CARAFATE) 1 GM/10ML suspension Take 10 mLs (1 g total) by mouth 4 (four) times daily -  with meals and at bedtime. Qty: 420 mL, Refills: 0      CONTINUE these medications which have CHANGED   Details  pantoprazole (PROTONIX) 40 MG tablet Take 1 tablet (40 mg total) by mouth 2 (two) times daily before a meal. Qty: 60 tablet, Refills: 0      CONTINUE these medications which have NOT CHANGED   Details  losartan (COZAAR) 100 MG tablet Take 100 mg by mouth daily.     Multiple Vitamins-Minerals (MULTIVITAMINS THER. W/MINERALS) TABS Take 1 tablet by mouth daily.    simvastatin (ZOCOR) 20 MG tablet Take 20 mg by mouth every evening. Refills: 0    triamterene-hydrochlorothiazide (MAXZIDE-25) 37.5-25 MG tablet Take 1 tablet by mouth daily. Refills: 5    oxyCODONE-acetaminophen (PERCOCET) 5-325 MG per tablet Take 1-2 tablets by mouth every 6 (six) hours as needed. Qty: 10 tablet, Refills: 0       Allergies  Allergen Reactions  . Lisinopril Swelling    Swelling in mouth and throat  . Penicillins Rash    Has patient had a PCN reaction causing immediate rash,  facial/tongue/throat swelling, SOB or lightheadedness with hypotension: No Has patient had a PCN reaction causing severe rash involving mucus membranes or skin necrosis: No Has patient had a PCN reaction that required hospitalization No Has patient had a PCN reaction occurring within the last 10 years: No If all of the above answers are "NO", then may proceed with Cephalosporin use.   . Tramadol Hcl Rash   Follow-up Information    Follow up with Gwynneth Aliment, MD. Go in 1 week.   Specialty:  Internal Medicine   Why:  follow up   Contact information:   [address] [place] [address] [phone]       Follow up with [facility]. Call in 3 days.   Why:  follow up   Contact information:   [address] [place] [address] [phone]       The results of significant diagnostics from this hospitalization (including imaging, microbiology, ancillary and laboratory) are listed below for reference.    Significant Diagnostic Studies: Dg Chest 2 View  03/02/2015  CLINICAL DATA:  69 year old female with syncope. EXAM: CHEST  2 VIEW  COMPARISON:  05/30/2014 and prior chest radiographs dating back to 04/05/2007 FINDINGS: The cardiomediastinal silhouette is unremarkable. There is no evidence of focal airspace disease, pulmonary edema, suspicious pulmonary nodule/mass, pleural effusion, or pneumothorax. No acute bony abnormalities are identified. Lower cervical spine fusion changes again noted. IMPRESSION: No active cardiopulmonary disease. Electronically Signed   By: Harmon Pier M.D.   On: 03/02/2015 17:43   Ct Head Wo Contrast  03/02/2015  CLINICAL DATA:  Dizziness. EXAM: CT HEAD WITHOUT CONTRAST TECHNIQUE: Contiguous axial images were obtained from the base of the skull through the vertex without intravenous contrast. COMPARISON:  04/29/2013 FINDINGS: There is prominence of the ventricles, similar to previous exam. Low attenuation throughout the  subcortical and periventricular white matter is noted compatible with chronic microvascular disease. There is no evidence for acute cortical infarct, intracranial hemorrhage or mass. The paranasal sinuses and mastoid air cells are clear. The calvarium appears intact. IMPRESSION: 1. No acute intracranial abnormalities. 2. Chronic microvascular disease and brain atrophy. Electronically Signed   By: Signa Kell M.D.   On: 03/02/2015 14:40   Mr Abdomen W Wo Contrast  03/06/2015  CLINICAL DATA:  Recurrent nausea and vomiting for several months. Unable eat for 5 days. Mildly dilated pancreatic duct on ultrasound. MRI recommended for further evaluation. EXAM: MRI ABDOMEN WITHOUT AND WITH CONTRAST TECHNIQUE: Multiplanar multisequence MR imaging of the abdomen was performed both before and after the administration of intravenous contrast. CONTRAST:  20mL MULTIHANCE GADOBENATE DIMEGLUMINE 529 MG/ML IV SOLN COMPARISON:  Ultrasound 03/05/2015 FINDINGS: Lower chest:  Trace bilateral pleural effusions. Hepatobiliary: Normal hepatic signal intensity. No significant hepatic cyst steatosis noted on opposed phase imaging. No focal lesion. No intrahepatic biliary duct dilatation. The gallbladder is normal. No calculi identified. The common bile duct is normal caliber. Pancreas: The pancreatic duct is felt to be within normal limits of the body and tail measuring 2.7 mm. There is some mild dilatation the through the pancreatic head just proximal to the ampulla. There is a small cystic lesion adjacent to the distal pancreatic duct measuring 5 mm on image 94, series 7). There is a second small cystic lesion also in the head of the pancreas but more removed from the pancreatic duct measuring 6 mm on image 80, series 7. These 2 lesions are also well demonstrated on image 83, series 700. The pancreatic parenchyma itself appears normal without evidence of abnormal enhancement. Spleen: Normal spleen. Adrenals/urinary tract: Several  small nonenhancing cysts within the renal parenchyma are less than 1 cm. Stomach/Bowel: Stomach and limited of the small bowel is unremarkable Vascular/Lymphatic: Abdominal aortic normal caliber. No retroperitoneal periportal lymphadenopathy. Musculoskeletal: No aggressive osseous lesion IMPRESSION: 1. Mild dilatation of the pancreatic duct is felt within normal limits. No pancreatic mass lesion. 2. 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 Recommend follow-up MRI with without contrast in 12 months. This recommendation follows ACR consensus guidelines: Managing Incidental Findings on Abdominal CT: White Paper of the ACR Incidental Findings Committee. J Am Coll Radiol 2010;7:754-773 3. Bilateral small benign Bosniak 1 renal cysts 4. Normal liver parenchyma. Electronically Signed   By: Genevive Bi M.D.   On: 03/06/2015 07:37   Nm Gastric Emptying  03/08/2015  CLINICAL DATA:  Nausea, vomiting and epigastric pain for several months. Subsequent encounter. EXAM: NUCLEAR MEDICINE GASTRIC EMPTYING SCAN TECHNIQUE: After oral ingestion of radiolabeled meal, sequential abdominal images were obtained for 4 hours.  Percentage of activity emptying the stomach was calculated at 1 hour, 2 hour, 3 hour, and 4 hours. RADIOPHARMACEUTICALS:  2.0 mCi [id number] MDP labeled sulfur colloid orally COMPARISON:  Upper GI series 03/06/2015. FINDINGS: Expected location of the stomach in the left upper quadrant. Ingested meal empties the stomach gradually over the course of the study. 16.6 emptied at 1 hr ( normal >= 10%) 32.0 emptied at 2 hr ( normal >= 40%) 49.2 emptied at 3 hr ( normal >= 70%) 88.1 emptied at 4 hr ( normal >= 90%) IMPRESSION: Normal gastric emptying through 3 hours with minimal delay noted at 4 hours. Electronically Signed   By: Drusilla Kannerhomas  Dalessio M.D.   On: 03/08/2015 12:39   [place] Abdomen  Complete  03/05/2015  CLINICAL DATA:  Nausea and vomiting. EXAM: ULTRASOUND ABDOMEN COMPLETE COMPARISON:  10/14/2013 abdominal sonogram. FINDINGS: Gallbladder: No gallstones or wall thickening visualized. No sonographic Murphy sign noted. Common bile duct: Diameter: 5 mm Liver: Liver parenchyma is diffusely mildly echogenic with mild posterior acoustic attenuation, suggesting mild diffuse hepatic steatosis. No liver mass is detected, noting decreased sensitivity in the setting of an echogenic liver. No liver surface irregularity is demonstrated. IVC: No abnormality visualized. Pancreas: There is borderline dilatation of the main pancreatic duct (3 mm diameter, previously 2 mm). No mass in the visualized pancreas, noted limited visualization of the pancreatic head and tail due to overlying bowel gas. Spleen: Size and appearance within normal limits. Right Kidney: Length: 10.4 cm. Echogenicity within normal limits. No mass or hydronephrosis visualized. Left Kidney: Length: 10.6 cm. Echogenicity within normal limits. No mass or hydronephrosis visualized. Abdominal aorta: No aneurysm visualized. Other findings: None. IMPRESSION: 1. Borderline dilated main pancreatic duct. Recommend CT abdomen with intravenous contrast or MRI abdomen with and without intravenous contrast to exclude a pancreatic head mass. 2. Mild diffuse hepatic steatosis. 3. Otherwise normal abdominal sonogram, with no cholelithiasis. Electronically Signed   By: Delbert PhenixJason A Poff M.D.   On: 03/05/2015 14:28   Dg Ugi W/high Density W/kub  03/06/2015  CLINICAL DATA:  Nausea and vomiting, unexplained. EXAM: UPPER GI SERIES WITH KUB TECHNIQUE: After obtaining a scout radiograph a routine upper GI series was performed using thin and high density barium. FLUOROSCOPY TIME:  Radiation Exposure Index (as provided by the fluoroscopic device): If the device does not provide the exposure index: Fluoroscopy Time (in minutes and seconds):  1 minutes 28 seconds. Number  of Acquired Images:  11 COMPARISON:  MR abdomen 03/06/2015. FINDINGS: Scout view of the abdomen shows a normal bowel gas pattern. Postoperative changes in the lumbar spine. Single contrast examination of the upper gastrointestinal tract shows normal esophageal motility. No esophageal fold thickening, stricture or obstruction. Small hiatal hernia. Stomach is otherwise unremarkable. IMPRESSION: Small hiatal hernia. Electronically Signed   By: Leanna BattlesMelinda  Blietz M.D.   On: 03/06/2015 14:29    Microbiology: No results found for this or any previous visit (from the past 240 hour(s)).   Labs: Basic Metabolic Panel:  Recent Labs Lab 03/02/15 1409 03/03/15 0536 03/06/15 0450 03/08/15 0500 03/09/15 0514  NA 138 141 141 141 140  K 3.6 3.6 3.5 3.2* 3.7  CL 103 105 108 107 103  CO2 28 27 29 28 28   GLUCOSE 127* 94 98 92 105*  BUN 14 11 7 6 10   CREATININE 1.04* 1.11* 1.21* 0.92 1.10*  CALCIUM 9.0 8.7* 8.4* 8.7* 9.5  MG  --   --   --  2.0  --    Liver Function  Tests:  Recent Labs Lab 03/02/15 1409 03/03/15 0536 03/06/15 0450 03/08/15 0500  AST 17 12* 18 29  ALT 8* 7* 8* 12*  ALKPHOS 57 50 50 51  BILITOT 0.6 0.9 0.3 0.7  PROT 7.1 6.3* 6.2* 6.5  ALBUMIN 3.9 3.6 3.3* 3.4*    Recent Labs Lab 03/02/15 1409  LIPASE 54*   No results for input(s): AMMONIA in the last 168 hours. CBC:  Recent Labs Lab 03/02/15 1409 03/03/15 0536 03/06/15 0450 03/08/15 0500 03/09/15 0514  WBC 3.4* 4.2 3.7* 3.5* 5.3  NEUTROABS  --   --   --   --  2.1  HGB 12.2 11.9* 11.7* 12.1 13.4  HCT 38.0 37.0 36.4 37.0 40.5  MCV 80.9 81.7 82.0 80.6 79.9  PLT 196 174 159 172 208   Cardiac Enzymes:  Recent Labs Lab 03/02/15 1409 03/02/15 2005 03/03/15 0012 03/03/15 0536  TROPONINI <0.03 <0.03 <0.03 <0.03   CBG:  Recent Labs Lab 03/03/15 1653 03/04/15 0724 03/05/15 0521 03/06/15 0619 03/08/15 1237  GLUCAP 99 82 85 72 74   Signed:  Cayde Held  [facility] 03/09/2015, 7:55 AM

## 2015-07-07 DIAGNOSIS — I10 Essential (primary) hypertension: Secondary | ICD-10-CM | POA: Diagnosis not present

## 2015-07-07 DIAGNOSIS — R42 Dizziness and giddiness: Secondary | ICD-10-CM | POA: Diagnosis not present

## 2015-07-07 DIAGNOSIS — K862 Cyst of pancreas: Secondary | ICD-10-CM | POA: Diagnosis not present

## 2015-07-07 DIAGNOSIS — R5383 Other fatigue: Secondary | ICD-10-CM | POA: Diagnosis not present

## 2015-07-17 ENCOUNTER — Encounter: Payer: Self-pay | Admitting: Gastroenterology

## 2015-09-05 ENCOUNTER — Ambulatory Visit: Payer: Commercial Managed Care - HMO | Admitting: Gastroenterology

## 2015-09-15 DIAGNOSIS — H524 Presbyopia: Secondary | ICD-10-CM | POA: Diagnosis not present

## 2015-09-15 DIAGNOSIS — H25811 Combined forms of age-related cataract, right eye: Secondary | ICD-10-CM | POA: Diagnosis not present

## 2015-09-28 ENCOUNTER — Other Ambulatory Visit: Payer: Self-pay | Admitting: Internal Medicine

## 2015-09-28 DIAGNOSIS — Z1231 Encounter for screening mammogram for malignant neoplasm of breast: Secondary | ICD-10-CM

## 2015-10-04 DIAGNOSIS — J0110 Acute frontal sinusitis, unspecified: Secondary | ICD-10-CM | POA: Diagnosis not present

## 2015-10-16 ENCOUNTER — Encounter: Payer: Self-pay | Admitting: Gastroenterology

## 2015-10-23 ENCOUNTER — Ambulatory Visit: Payer: Commercial Managed Care - HMO

## 2015-10-30 ENCOUNTER — Ambulatory Visit (INDEPENDENT_AMBULATORY_CARE_PROVIDER_SITE_OTHER): Payer: Medicare Other | Admitting: Gastroenterology

## 2015-10-30 ENCOUNTER — Encounter: Payer: Self-pay | Admitting: Gastroenterology

## 2015-10-30 VITALS — BP 138/80 | HR 72 | Ht 63.0 in | Wt 261.6 lb

## 2015-10-30 DIAGNOSIS — R1013 Epigastric pain: Secondary | ICD-10-CM

## 2015-10-30 NOTE — Patient Instructions (Addendum)
MRI with MRCP in 02/2016 for pancreatic cyst follow up. We will call you when it is time to set this up.   Colonoscopy for routine risk screening in 04/2020. You should try to lose weight, focusing on lower overall daily calories.   [facility] [phone] N. [address] [place], [place] [address] Contact Office: [phone]

## 2015-10-30 NOTE — Progress Notes (Signed)
Review of pertinent gastrointestinal problems: 1. Routine risk for colon cancer.  Colonoscopy Dr. Evette CristalGanem April 2005 for "family history of polyps" the examination was normal and he recommended repeat colonoscopy at 5 years. Colonoscopy Dr. Evette CristalGanem 2010 again done for family history of polyps, again the examination was normal, again he recommended repeat colonoscopy at five-year interval. Colonoscopy January 2012 Dr. Sheryn Bisonavid Patterson for "history of colon cancer" (not sure where that never came from), the examination was normal and he recommended repeat colonoscopy at 10 year interval 2. Dysphagia EGD Dr. Sheryn Bisonavid Patterson 1 2012 showed Schatzki's ring that was dilated to 54 [place]. He also noted a small hiatal hernia. 3. Abdominal pain 2015 led to ultrasound and HIDA scan that were both essentially normal. Repeat EGD Dr. Wendall Papaan Jacobs found mild gastritis that was H. pylori negative.  HPI: This is a  pleasant 70 year old woman pancreatic cyst, consider repeat colonoscopy for colon cancer screening     who was referred to me by Dorothyann PengSanders, Robyn, MD  to evaluate  pancreatic cyst, colon cancer screening .    Chief complaint is pancreatic cysts, colon cancer screening  MRI 02/2015: done for nausea vomiting while in patient (seen by [facility]);  Also had SBFT and GES that were both normal. The pancreatic duct is felt to be within normal limits of the body and tail measuring 2.7 mm. There is some mild dilatation the through the pancreatic head just proximal to the ampulla. There is a small cystic lesion adjacent to the distal pancreatic duct measuring 5 mm on image 94, series 7). There is a second small cystic lesion also in the head of the pancreas but more removed from the pancreatic duct measuring 6 mm on image 80, series 7. These 2 lesions are also well demonstrated on image 83, series 700  Never had pancreatitis that she is aware.  Cousin with pancreatic cancer.  Never etoh drinker.  Non smoker.  Overall  her weight is up, has gained.   Has been vomiting occasionally.  She has morbid obesity with a BMI of 46  Review of systems: Pertinent positive and negative review of systems were noted in the above HPI section. Complete review of systems was performed and was otherwise normal.   Past Medical History  Diagnosis Date  . Hypertension   . Hyperlipidemia   . GERD (gastroesophageal reflux disease)   . Arthritis   . Diastolic dysfunction   . Pancreatic cyst 02/2015    Recommend follow-up MRI in 12 months.    Past Surgical History  Procedure Laterality Date  . Neck surgery    . Abdominal hysterectomy    . Replacement total knee bilateral    . Back surgery    . Thyroidectomy, partial    . Anterior cervical decomp/discectomy fusion N/A 07/20/2012    Procedure: ANTERIOR CERVICAL DISCECTOMY FUSION C5-6, C6-7 with transgraft(Alphatec), local bone graft, plate and screws ;  Surgeon: Kerrin ChampagneJames E Nitka, MD;  Location: [facility] OR;  Service: Orthopedics;  Laterality: N/A;    Current Outpatient Prescriptions  Medication Sig Dispense Refill  . BIOTIN PO Take 1 capsule by mouth daily.    [name]. losartan (COZAAR) 100 MG tablet Take 100 mg by mouth daily.     . Multiple Vitamins-Minerals (MULTIVITAMINS THER. W/MINERALS) TABS Take 1 tablet by mouth daily.    [name]. omeprazole (PRILOSEC) 40 MG capsule Take 40 mg by mouth 2 (two) times daily.    . simvastatin (ZOCOR) 20 MG tablet Take 20 mg by mouth  every evening.  0  . triamterene-hydrochlorothiazide (MAXZIDE-25) 37.5-25 MG tablet Take 0.5 tablets by mouth daily.   5   No current facility-administered medications for this visit.    Allergies as of 10/30/2015 - Review Complete 10/30/2015  Allergen Reaction Noted  . Lisinopril Swelling 11/10/2011  . Penicillins Rash   . Tramadol hcl Rash     Family History  Problem Relation Age of Onset  . Ovarian cancer Mother   . Heart disease Mother   . Prostate cancer Father   . Heart disease Father   . Colon cancer  Father   . Breast cancer Sister   . Prostate cancer Brother   . Cystic fibrosis Sister   . Heart disease Sister   . Pancreatic cancer Neg Hx   . Rectal cancer Neg Hx   . Stomach cancer Neg Hx     Social History   Social History  . Marital Status: Divorced    Spouse Name: N/A  . Number of Children: 2  . Years of Education: N/A   Occupational History  . Retired    Social History Main Topics  . Smoking status: Never Smoker   . Smokeless tobacco: Never Used  . Alcohol Use: No  . Drug Use: No  . Sexual Activity: Not on file   Other Topics Concern  . Not on file   Social History Narrative     Physical Exam: BP 138/80 mmHg  Pulse 72  Ht 5\' 3"  (1.6 m)  Wt 261 lb 9.6 oz (118.661 kg)  BMI 46.35 kg/m2 Constitutional: generally well-appearing Psychiatric: alert and oriented x3 Eyes: extraocular movements intact Mouth: oral pharynx moist, no lesions Neck: supple no lymphadenopathy Cardiovascular: heart regular rate and rhythm Lungs: clear to auscultation bilaterally Abdomen: soft, nontender, nondistended, no obvious ascites, no peritoneal signs, normal bowel sounds Extremities: no lower extremity edema bilaterally Skin: no lesions on visible extremities   Assessment and plan: 70 y.o. female with  Incidentally noted pancreatic cysts, chronic upper GI nausea, dyspepsia, routine risk for colon cancer  First her upper GI symptoms are chronic. She has been gaining weight. She has had numerous tests including abdominal ultrasound, HIDA scan, EGD, small bowel follow-through, gastric emptying scan. I think her morbid obesity is causing a lot of her symptoms and I recommended she try to focus on lower calorie intake daily. I offered nutritional consultation but she declined since she has visited them in the past and says they are helpful. She asked about bariatric surgeries and we will give her the number that she needed to call in order to initiate that relationship. Her last  screening colonoscopy was 2012 and it was normal. She is at routine risk and she does not need repeat colon cancer screening until 2022. We'll put her in our reminder system for that. MRI evaluation of the pancreas showed 2 very small cysts in the head. These are unlikely anything serious but do require interval imaging. We will arrange for repeat MRI of the pancreas November 2017 which will be 1 year since her previous imaging tests.   Rob Buntinganiel Jacobs, MD [facility] 10/30/2015, 1:51 PM  Cc: Dorothyann PengSanders, Robyn, MD

## 2015-11-05 ENCOUNTER — Emergency Department (HOSPITAL_COMMUNITY)
Admission: EM | Admit: 2015-11-05 | Discharge: 2015-11-05 | Disposition: A | Discharge status: Home / Self Care | Payer: Medicare Other | Attending: Physician Assistant | Admitting: Physician Assistant

## 2015-11-05 ENCOUNTER — Encounter (HOSPITAL_COMMUNITY): Payer: Self-pay | Admitting: Emergency Medicine

## 2015-11-05 DIAGNOSIS — Z79899 Other long term (current) drug therapy: Secondary | ICD-10-CM | POA: Insufficient documentation

## 2015-11-05 DIAGNOSIS — Z96653 Presence of artificial knee joint, bilateral: Secondary | ICD-10-CM | POA: Diagnosis not present

## 2015-11-05 DIAGNOSIS — I10 Essential (primary) hypertension: Secondary | ICD-10-CM | POA: Diagnosis not present

## 2015-11-05 DIAGNOSIS — M545 Low back pain: Secondary | ICD-10-CM | POA: Insufficient documentation

## 2015-11-05 DIAGNOSIS — M5489 Other dorsalgia: Secondary | ICD-10-CM | POA: Diagnosis not present

## 2015-11-05 LAB — URINALYSIS, ROUTINE W REFLEX MICROSCOPIC
BILIRUBIN URINE: NEGATIVE
GLUCOSE, UA: NEGATIVE mg/dL
HGB URINE DIPSTICK: NEGATIVE
KETONES UR: NEGATIVE mg/dL
Leukocytes, UA: NEGATIVE
Nitrite: NEGATIVE
PH: 6.5 (ref 5.0–8.0)
Protein, ur: NEGATIVE mg/dL
Specific Gravity, Urine: 1.006 (ref 1.005–1.030)

## 2015-11-05 MED ORDER — OXYCODONE-ACETAMINOPHEN 5-325 MG PO TABS: 1.0000 | ORAL_TABLET | Freq: Four times a day (QID) | ORAL | Status: DC | PRN

## 2015-11-05 MED ORDER — OXYCODONE-ACETAMINOPHEN 5-325 MG PO TABS
1.0000 | ORAL_TABLET | Freq: Once | ORAL | Status: AC
  Administered 2015-11-05: 1 via ORAL
  Filled 2015-11-05: qty 1

## 2015-11-05 MED ORDER — CYCLOBENZAPRINE HCL 10 MG PO TABS
10.0000 mg | ORAL_TABLET | Freq: Once | ORAL | Status: AC
  Administered 2015-11-05: 10 mg via ORAL
  Filled 2015-11-05: qty 1

## 2015-11-05 MED ORDER — CYCLOBENZAPRINE HCL 10 MG PO TABS: 10.0000 mg | ORAL_TABLET | Freq: Three times a day (TID) | ORAL | Status: DC | PRN

## 2015-11-05 NOTE — ED Notes (Signed)
Per EMS:  Pt has a hx of chronic back pain (decompressions, fusions and degenerative disc disease).  Pt sts she has been having severe back pain x 3 weeks but today was "at my breaking point".  Pt tearful and experiences significant pain increase with movement/palpation.  Pt sts she took 2 aleve today without any relief.

## 2015-11-05 NOTE — ED Provider Notes (Addendum)
CSN: [id number]     Arrival date & time 11/05/15  1555 History   First MD Initiated Contact with Patient 11/05/15 1607     Chief Complaint  Patient presents with  . Back Pain     (Consider location/radiation/quality/duration/timing/severity/associated sxs/prior Treatment) HPI Comments: Patient is a 70 year old female presenting with back pain. Patient history of chronic back pain. She's had multiple fusions and surgeries in the past. Most recently 2009 by Dr. Tanda Rockers. Patient reports that she usually requires no pain medication. She has occasional flares or she gets hydrocodone from her primary care physician. Patient states that she is having increasing pain over the last 3 weeks. She thinks that she "tweaked it". Patient had no fever, numbness tingling, trauma. It is worse with movement. No pain at rest.  Patient is a 70 y.o. female presenting with back pain.  Back Pain Associated symptoms: no chest pain, no fever and no weakness     Past Medical History  Diagnosis Date  . Hypertension   . Hyperlipidemia   . GERD (gastroesophageal reflux disease)   . Arthritis   . Diastolic dysfunction   . Pancreatic cyst 02/2015    Recommend follow-up MRI in 12 months.   Past Surgical History  Procedure Laterality Date  . Neck surgery    . Abdominal hysterectomy    . Replacement total knee bilateral    . Back surgery    . Thyroidectomy, partial    . Anterior cervical decomp/discectomy fusion N/A 07/20/2012    Procedure: ANTERIOR CERVICAL DISCECTOMY FUSION C5-6, C6-7 with transgraft(Alphatec), local bone graft, plate and screws ;  Surgeon: Kerrin Champagne, MD;  Location: [facility] OR;  Service: Orthopedics;  Laterality: N/A;   Family History  Problem Relation Age of Onset  . Ovarian cancer Mother   . Heart disease Mother   . Prostate cancer Father   . Heart disease Father   . Colon cancer Father   . Breast cancer Sister   . Prostate cancer Brother   . Cystic fibrosis Sister   . Heart disease  Sister   . Pancreatic cancer Neg Hx   . Rectal cancer Neg Hx   . Stomach cancer Neg Hx    Social History  Substance Use Topics  . Smoking status: Never Smoker   . Smokeless tobacco: Never Used  . Alcohol Use: No   OB History    No data available     Review of Systems  Constitutional: Positive for activity change. Negative for fever, fatigue and unexpected weight change.  Respiratory: Negative for shortness of breath.   Cardiovascular: Negative for chest pain.  Genitourinary: Negative for dyspareunia.  Musculoskeletal: Positive for back pain.  Neurological: Negative for dizziness, syncope and weakness.      Allergies  Lisinopril; Penicillins; and Tramadol hcl  Home Medications   Prior to Admission medications   Medication Sig Start Date End Date Taking? Authorizing Provider  BIOTIN PO Take 1 capsule by mouth daily.    Historical Provider, MD  losartan (COZAAR) 100 MG tablet Take 100 mg by mouth daily.  07/21/12 10/29/17  Historical Provider, MD  Multiple Vitamins-Minerals (MULTIVITAMINS THER. W/MINERALS) TABS Take 1 tablet by mouth daily.    Historical Provider, MD  omeprazole (PRILOSEC) 40 MG capsule Take 40 mg by mouth 2 (two) times daily.    Historical Provider, MD  simvastatin (ZOCOR) 20 MG tablet Take 20 mg by mouth every evening. 02/08/15   Historical Provider, MD  triamterene-hydrochlorothiazide (MAXZIDE-25) 37.5-25 MG tablet Take 0.5  tablets by mouth daily.  02/17/15   Historical Provider, MD   BP 146/77 mmHg  Pulse 71  Temp(Src) 99 F (37.2 C) (Oral)  Resp 26  SpO2 99% Physical Exam  Constitutional: She is oriented to person, place, and time. She appears well-developed and well-nourished.  HENT:  Head: Normocephalic and atraumatic.  Eyes: Conjunctivae are normal. Right eye exhibits no discharge.  Neck: Neck supple.  Pulmonary/Chest: Effort normal.  Abdominal: Soft. She exhibits no distension. There is no tenderness.  Musculoskeletal: Normal range of motion.  She exhibits no edema.  Mildly limited by pain.  Neurological: She is oriented to person, place, and time. No cranial nerve deficit.  Normal sensation bilateral lower legs.. 4 out of 4 strength bilaterally lower legs.  Skin: Skin is warm and dry. No rash noted. She is not diaphoretic.  Psychiatric: She has a normal mood and affect. Her behavior is normal.  Nursing note and vitals reviewed.   ED Course  Procedures (including critical care time) Labs Review Labs Reviewed  URINE CULTURE  URINALYSIS, ROUTINE W REFLEX MICROSCOPIC (NOT AT [facility])    Imaging Review No results found. I have personally reviewed and evaluated these images and lab results as part of my medical decision-making.   EKG Interpretation None      MDM   Final diagnoses:  None    She is a very pleasant [age] year old female presenting with back pain. This is been going on for 3 weeks. Patient reports it has been getting worse. She needs pain control for home. Patient denies  red flags. She has no fever, weakness, numbness, tingling, bowel or bladder incontinence. Patient has had no recent trauma. Therefore I do not think that imaging is required. We will give her pain control make sure that she is able to walk, urinate. Plan to discharge home with follow-up with her PCP in one to 2 days.    Eames Dibiasio Randall AnLyn Ozie Dimaria, MD 11/05/15 1630  5:35 PM Patient making multiple requests to leave,. Pending UA.  Patient ambulatory without issue after pain medication. Return precuations given.   Tynika Luddy Randall AnLyn Wilberto Console, MD 11/05/15 1735

## 2015-11-06 LAB — URINE CULTURE

## 2015-11-07 ENCOUNTER — Ambulatory Visit: Payer: Commercial Managed Care - HMO

## 2015-11-07 DIAGNOSIS — M4726 Other spondylosis with radiculopathy, lumbar region: Secondary | ICD-10-CM | POA: Diagnosis not present

## 2015-11-07 DIAGNOSIS — M4327 Fusion of spine, lumbosacral region: Secondary | ICD-10-CM | POA: Diagnosis not present

## 2015-11-09 ENCOUNTER — Other Ambulatory Visit: Payer: Self-pay | Admitting: Sports Medicine

## 2015-11-09 DIAGNOSIS — M545 Low back pain: Secondary | ICD-10-CM

## 2015-11-09 DIAGNOSIS — M48061 Spinal stenosis, lumbar region without neurogenic claudication: Secondary | ICD-10-CM

## 2015-11-09 DIAGNOSIS — R531 Weakness: Secondary | ICD-10-CM

## 2015-11-18 ENCOUNTER — Ambulatory Visit
Admission: RE | Admit: 2015-11-18 | Discharge: 2015-11-18 | Disposition: A | Discharge status: Home / Self Care | Payer: Medicare Other | Source: Ambulatory Visit | Attending: Sports Medicine | Admitting: Sports Medicine

## 2015-11-18 DIAGNOSIS — R531 Weakness: Secondary | ICD-10-CM

## 2015-11-18 DIAGNOSIS — M48061 Spinal stenosis, lumbar region without neurogenic claudication: Secondary | ICD-10-CM

## 2015-11-18 DIAGNOSIS — M545 Low back pain: Secondary | ICD-10-CM

## 2015-11-18 DIAGNOSIS — M5126 Other intervertebral disc displacement, lumbar region: Secondary | ICD-10-CM | POA: Diagnosis not present

## 2015-12-07 DIAGNOSIS — M4806 Spinal stenosis, lumbar region: Secondary | ICD-10-CM | POA: Diagnosis not present

## 2015-12-07 DIAGNOSIS — M4316 Spondylolisthesis, lumbar region: Secondary | ICD-10-CM | POA: Diagnosis not present

## 2015-12-27 DIAGNOSIS — M545 Low back pain: Secondary | ICD-10-CM | POA: Diagnosis not present

## 2015-12-27 DIAGNOSIS — I10 Essential (primary) hypertension: Secondary | ICD-10-CM | POA: Diagnosis not present

## 2015-12-27 DIAGNOSIS — Z01818 Encounter for other preprocedural examination: Secondary | ICD-10-CM | POA: Diagnosis not present

## 2015-12-27 DIAGNOSIS — R799 Abnormal finding of blood chemistry, unspecified: Secondary | ICD-10-CM | POA: Diagnosis not present

## 2016-01-18 ENCOUNTER — Other Ambulatory Visit: Payer: Self-pay | Admitting: Specialist

## 2016-01-18 ENCOUNTER — Encounter (HOSPITAL_COMMUNITY): Payer: Self-pay

## 2016-01-18 NOTE — Pre-Procedure Instructions (Signed)
[name]  01/18/2016      [facility] AT [facility], [place] - [address] [place] [address] Phone: [phone] Fax: [phone]  [facility] - [place], [place] - [address] [place] [address] Phone: [phone] Fax: [phone]  [id number] - [place], [place] - [address] AT [facility] [address] [place] [address] Phone: [phone] Fax: [phone]  [facility] - [place], [place] - [address] [place] [address] Phone: [phone] Fax: [phone]    Your procedure is scheduled on Tuesday, September 26th, 2017.  Report to [facility] Admitting at 5:30 A.M.   Call this number if you have problems the morning of surgery:  [phone]   Remember:  Do not eat food or drink liquids after midnight.   Take these medicines the morning of surgery with A SIP OF WATER: Gabapentin (Neurontin), Hydrocodone-Acetaminophen (Norco) if needed, Omeprazole (Prilosec), Tizanidine (Zanaflex).    Stop taking: Aspirin, NSAIDS, Aleve, Naproxen, Ibuprofen, Advil, Motrin, BC's, Goody's, Fish oil, all herbal medications, and all vitamins.     Do not wear jewelry, make-up or nail polish.  Do not wear lotions, powders, or perfumes, or deoderant.  Do not shave 48 hours prior to surgery.    Do not bring valuables to the hospital.  [facility] is not responsible for any belongings or valuables.  Contacts, dentures or bridgework may not be worn into surgery.  Leave your suitcase in the car.  After surgery it may be brought to your room.  For patients admitted to the hospital, discharge time will be determined by your treatment team.  Patients discharged the day of surgery will not be allowed to drive home.   Special instructions:  Preparing for Surgery.   Please  read over the following fact sheets that you were given. MRSA Information    [place]- Preparing For Surgery  Before surgery, you can play an important role. Because skin is not sterile, your skin needs to be as free of germs as possible. You can reduce the number of germs on your skin by washing with CHG (chlorahexidine gluconate) Soap before surgery.  CHG is an antiseptic cleaner which kills germs and bonds with the skin to continue killing germs even after washing.  Please do not use if you have an allergy to CHG or antibacterial soaps. If your skin becomes reddened/irritated stop using the CHG.  Do not shave (including legs and underarms) for at least 48 hours prior to first CHG shower. It is OK to shave your face.  Please follow these instructions carefully.   1. Shower the NIGHT BEFORE SURGERY and the MORNING OF SURGERY with CHG.   2. If you chose to wash your hair, wash your hair first as usual with your normal shampoo.  3. After you shampoo, rinse your hair and body thoroughly to remove the shampoo.  4. Use CHG as you would any other liquid soap. You can apply CHG directly to the skin and wash gently with a scrungie or a clean washcloth.   5. Apply the CHG Soap to your body ONLY FROM THE NECK DOWN.  Do not use on open wounds or open sores. Avoid contact with your eyes, ears, mouth and genitals (private parts). Wash genitals (private parts) with your normal soap.  6. Wash  thoroughly, paying special attention to the area where your surgery will be performed.  7. Thoroughly rinse your body with warm water from the neck down.  8. DO NOT shower/wash with your normal soap after using and rinsing off the CHG Soap.  9. Pat yourself dry with a CLEAN TOWEL.   10. Wear CLEAN PAJAMAS   11. Place CLEAN SHEETS on your bed the night of your first shower and DO NOT SLEEP WITH PETS.  Day of Surgery: Do not apply any deodorants/lotions. Please wear clean clothes to the hospital/surgery  center.

## 2016-01-19 ENCOUNTER — Encounter (HOSPITAL_COMMUNITY): Payer: Self-pay

## 2016-01-19 ENCOUNTER — Encounter (HOSPITAL_COMMUNITY)
Admission: RE | Admit: 2016-01-19 | Discharge: 2016-01-19 | Disposition: A | Discharge status: Home / Self Care | Payer: Medicare Other | Source: Ambulatory Visit | Attending: Specialist | Admitting: Specialist

## 2016-01-19 DIAGNOSIS — Z01818 Encounter for other preprocedural examination: Secondary | ICD-10-CM | POA: Insufficient documentation

## 2016-01-19 HISTORY — DX: Unspecified cataract: H26.9

## 2016-01-19 LAB — COMPREHENSIVE METABOLIC PANEL
ALBUMIN: 3.9 g/dL (ref 3.5–5.0)
ALT: 9 U/L — AB (ref 14–54)
AST: 17 U/L (ref 15–41)
Alkaline Phosphatase: 60 U/L (ref 38–126)
Anion gap: 7 (ref 5–15)
BILIRUBIN TOTAL: 0.5 mg/dL (ref 0.3–1.2)
BUN: 13 mg/dL (ref 6–20)
CHLORIDE: 106 mmol/L (ref 101–111)
CO2: 27 mmol/L (ref 22–32)
CREATININE: 1.14 mg/dL — AB (ref 0.44–1.00)
Calcium: 9.4 mg/dL (ref 8.9–10.3)
GFR calc Af Amer: 56 mL/min — ABNORMAL LOW (ref >60)
GFR, EST NON AFRICAN AMERICAN: 48 mL/min — AB (ref >60)
GLUCOSE: 101 mg/dL — AB (ref 65–99)
POTASSIUM: 3.9 mmol/L (ref 3.5–5.1)
Sodium: 140 mmol/L (ref 135–145)
Total Protein: 6.7 g/dL (ref 6.5–8.1)

## 2016-01-19 LAB — URINALYSIS, ROUTINE W REFLEX MICROSCOPIC
Bilirubin Urine: NEGATIVE
GLUCOSE, UA: NEGATIVE mg/dL
Hgb urine dipstick: NEGATIVE
Ketones, ur: NEGATIVE mg/dL
LEUKOCYTES UA: NEGATIVE
NITRITE: NEGATIVE
PH: 6.5 (ref 5.0–8.0)
Protein, ur: NEGATIVE mg/dL
SPECIFIC GRAVITY, URINE: 1.007 (ref 1.005–1.030)

## 2016-01-19 LAB — PROTIME-INR
INR: 1.11
PROTHROMBIN TIME: 14.4 s (ref 11.4–15.2)

## 2016-01-19 LAB — CBC
HEMATOCRIT: 39.3 % (ref 36.0–46.0)
Hemoglobin: 12.5 g/dL (ref 12.0–15.0)
MCH: 25.8 pg — ABNORMAL LOW (ref 26.0–34.0)
MCHC: 31.8 g/dL (ref 30.0–36.0)
MCV: 81 fL (ref 78.0–100.0)
PLATELETS: 200 10*3/uL (ref 150–400)
RBC: 4.85 MIL/uL (ref 3.87–5.11)
RDW: 15 % (ref 11.5–15.5)
WBC: 3.4 10*3/uL — AB (ref 4.0–10.5)

## 2016-01-19 LAB — TYPE AND SCREEN
ABO/RH(D): AB POS
Antibody Screen: NEGATIVE

## 2016-01-19 LAB — APTT: APTT: 32 s (ref 24–36)

## 2016-01-19 LAB — SURGICAL PCR SCREEN
MRSA, PCR: NEGATIVE
Staphylococcus aureus: NEGATIVE

## 2016-01-19 NOTE — Progress Notes (Signed)
PCP - Dr. Dorothyann Pengobyn Sanders Cardiologist - denies  EKG - 03/02/15 CXR - 03/02/15  Echo- 02/2015 Stress test/Cardiac Cath - denies  Patient denies chest pain and shortness of breath at PAT appointment.

## 2016-01-23 ENCOUNTER — Inpatient Hospital Stay (HOSPITAL_COMMUNITY): Payer: Medicare Other

## 2016-01-23 ENCOUNTER — Inpatient Hospital Stay (HOSPITAL_COMMUNITY): Payer: Medicare Other | Admitting: Anesthesiology

## 2016-01-23 ENCOUNTER — Inpatient Hospital Stay (HOSPITAL_COMMUNITY)
Admission: RE | Admit: 2016-01-23 | Discharge: 2016-01-29 | DRG: 460 | Disposition: A | Discharge status: Skilled Nursing Facility | Payer: Medicare Other | Source: Ambulatory Visit | Attending: Specialist | Admitting: Specialist

## 2016-01-23 ENCOUNTER — Encounter (HOSPITAL_COMMUNITY)
Admission: RE | Disposition: A | Discharge status: Skilled Nursing Facility | Payer: Self-pay | Source: Ambulatory Visit | Attending: Specialist

## 2016-01-23 ENCOUNTER — Encounter (HOSPITAL_COMMUNITY): Payer: Self-pay | Admitting: Urology

## 2016-01-23 DIAGNOSIS — Z8042 Family history of malignant neoplasm of prostate: Secondary | ICD-10-CM

## 2016-01-23 DIAGNOSIS — Z803 Family history of malignant neoplasm of breast: Secondary | ICD-10-CM | POA: Diagnosis not present

## 2016-01-23 DIAGNOSIS — K219 Gastro-esophageal reflux disease without esophagitis: Secondary | ICD-10-CM | POA: Diagnosis present

## 2016-01-23 DIAGNOSIS — E785 Hyperlipidemia, unspecified: Secondary | ICD-10-CM | POA: Diagnosis present

## 2016-01-23 DIAGNOSIS — R262 Difficulty in walking, not elsewhere classified: Secondary | ICD-10-CM

## 2016-01-23 DIAGNOSIS — M79604 Pain in right leg: Secondary | ICD-10-CM

## 2016-01-23 DIAGNOSIS — M48062 Spinal stenosis, lumbar region with neurogenic claudication: Secondary | ICD-10-CM | POA: Diagnosis not present

## 2016-01-23 DIAGNOSIS — M4156 Other secondary scoliosis, lumbar region: Secondary | ICD-10-CM | POA: Diagnosis present

## 2016-01-23 DIAGNOSIS — M419 Scoliosis, unspecified: Secondary | ICD-10-CM | POA: Diagnosis present

## 2016-01-23 DIAGNOSIS — D62 Acute posthemorrhagic anemia: Secondary | ICD-10-CM | POA: Diagnosis not present

## 2016-01-23 DIAGNOSIS — M5126 Other intervertebral disc displacement, lumbar region: Secondary | ICD-10-CM | POA: Diagnosis not present

## 2016-01-23 DIAGNOSIS — I10 Essential (primary) hypertension: Secondary | ICD-10-CM | POA: Diagnosis not present

## 2016-01-23 DIAGNOSIS — Z419 Encounter for procedure for purposes other than remedying health state, unspecified: Secondary | ICD-10-CM

## 2016-01-23 DIAGNOSIS — Z7982 Long term (current) use of aspirin: Secondary | ICD-10-CM | POA: Diagnosis not present

## 2016-01-23 DIAGNOSIS — M4316 Spondylolisthesis, lumbar region: Secondary | ICD-10-CM | POA: Diagnosis not present

## 2016-01-23 DIAGNOSIS — Z23 Encounter for immunization: Secondary | ICD-10-CM | POA: Diagnosis not present

## 2016-01-23 DIAGNOSIS — H269 Unspecified cataract: Secondary | ICD-10-CM | POA: Diagnosis present

## 2016-01-23 DIAGNOSIS — Z8249 Family history of ischemic heart disease and other diseases of the circulatory system: Secondary | ICD-10-CM | POA: Diagnosis not present

## 2016-01-23 DIAGNOSIS — Z01818 Encounter for other preprocedural examination: Secondary | ICD-10-CM

## 2016-01-23 DIAGNOSIS — Z981 Arthrodesis status: Secondary | ICD-10-CM | POA: Diagnosis not present

## 2016-01-23 DIAGNOSIS — Z800 Family history of malignant neoplasm of digestive organs: Secondary | ICD-10-CM

## 2016-01-23 DIAGNOSIS — M549 Dorsalgia, unspecified: Secondary | ICD-10-CM | POA: Diagnosis not present

## 2016-01-23 DIAGNOSIS — R531 Weakness: Secondary | ICD-10-CM | POA: Diagnosis not present

## 2016-01-23 DIAGNOSIS — R29898 Other symptoms and signs involving the musculoskeletal system: Secondary | ICD-10-CM

## 2016-01-23 DIAGNOSIS — Z8041 Family history of malignant neoplasm of ovary: Secondary | ICD-10-CM

## 2016-01-23 DIAGNOSIS — M4326 Fusion of spine, lumbar region: Secondary | ICD-10-CM | POA: Diagnosis not present

## 2016-01-23 DIAGNOSIS — M4806 Spinal stenosis, lumbar region: Secondary | ICD-10-CM | POA: Diagnosis not present

## 2016-01-23 HISTORY — PX: TRANSFORAMINAL LUMBAR INTERBODY FUSION (TLIF) WITH PEDICLE SCREW FIXATION 2 LEVEL: SHX6142

## 2016-01-23 SURGERY — TRANSFORAMINAL LUMBAR INTERBODY FUSION (TLIF) WITH PEDICLE SCREW FIXATION 2 LEVEL
Anesthesia: General | Site: Spine Lumbar

## 2016-01-23 MED ORDER — PROMETHAZINE HCL 25 MG/ML IJ SOLN
INTRAMUSCULAR | Status: AC
  Administered 2016-01-23: 20:00:00
  Filled 2016-01-23: qty 1

## 2016-01-23 MED ORDER — GABAPENTIN 300 MG PO CAPS
600.0000 mg | ORAL_CAPSULE | Freq: Three times a day (TID) | ORAL | Status: DC
  Administered 2016-01-24 – 2016-01-29 (×16): 600 mg via ORAL
  Filled 2016-01-23 (×17): qty 2

## 2016-01-23 MED ORDER — EPHEDRINE SULFATE 50 MG/ML IJ SOLN
INTRAMUSCULAR | Status: DC | PRN
  Administered 2016-01-23: 20 mg via INTRAVENOUS
  Administered 2016-01-23 (×2): 5 mg via INTRAVENOUS

## 2016-01-23 MED ORDER — LIDOCAINE 2% (20 MG/ML) 5 ML SYRINGE
INTRAMUSCULAR | Status: AC
  Filled 2016-01-23: qty 5

## 2016-01-23 MED ORDER — ALBUMIN HUMAN 5 % IV SOLN
INTRAVENOUS | Status: DC | PRN
  Administered 2016-01-23: 16:00:00 via INTRAVENOUS

## 2016-01-23 MED ORDER — ACETAMINOPHEN 650 MG RE SUPP: 650.0000 mg | RECTAL | Status: DC | PRN

## 2016-01-23 MED ORDER — HYDROCODONE-ACETAMINOPHEN 5-325 MG PO TABS
1.0000 | ORAL_TABLET | ORAL | Status: DC | PRN
  Administered 2016-01-24 – 2016-01-29 (×9): 2 via ORAL
  Filled 2016-01-23 (×9): qty 2

## 2016-01-23 MED ORDER — SODIUM CHLORIDE 0.9% FLUSH
3.0000 mL | Freq: Two times a day (BID) | INTRAVENOUS | Status: DC
  Administered 2016-01-24 – 2016-01-29 (×10): 3 mL via INTRAVENOUS

## 2016-01-23 MED ORDER — TRIAMTERENE-HCTZ 37.5-25 MG PO TABS
0.5000 | ORAL_TABLET | Freq: Every day | ORAL | Status: DC
  Administered 2016-01-24 – 2016-01-29 (×6): 0.5 via ORAL
  Filled 2016-01-23 (×6): qty 0.5

## 2016-01-23 MED ORDER — BUPIVACAINE HCL 0.5 % IJ SOLN
INTRAMUSCULAR | Status: DC | PRN
  Administered 2016-01-23: 20 mL

## 2016-01-23 MED ORDER — PROPOFOL 10 MG/ML IV BOLUS
INTRAVENOUS | Status: DC | PRN
  Administered 2016-01-23: 130 mg via INTRAVENOUS

## 2016-01-23 MED ORDER — DOCUSATE SODIUM 100 MG PO CAPS
100.0000 mg | ORAL_CAPSULE | Freq: Two times a day (BID) | ORAL | Status: DC
  Administered 2016-01-24 – 2016-01-29 (×12): 100 mg via ORAL
  Filled 2016-01-23 (×12): qty 1

## 2016-01-23 MED ORDER — VANCOMYCIN HCL 10 G IV SOLR
1500.0000 mg | INTRAVENOUS | Status: AC
  Administered 2016-01-23: 1500 mg via INTRAVENOUS
  Filled 2016-01-23: qty 1500

## 2016-01-23 MED ORDER — THROMBIN 20000 UNITS EX SOLR
CUTANEOUS | Status: AC
  Filled 2016-01-23: qty 20000

## 2016-01-23 MED ORDER — PROPOFOL 10 MG/ML IV BOLUS
INTRAVENOUS | Status: AC
  Filled 2016-01-23: qty 20

## 2016-01-23 MED ORDER — SODIUM CHLORIDE 0.9% FLUSH: 3.0000 mL | INTRAVENOUS | Status: DC | PRN

## 2016-01-23 MED ORDER — ADULT MULTIVITAMIN W/MINERALS CH
1.0000 | ORAL_TABLET | Freq: Every day | ORAL | Status: DC
  Administered 2016-01-24 – 2016-01-29 (×6): 1 via ORAL
  Filled 2016-01-23 (×6): qty 1

## 2016-01-23 MED ORDER — SIMVASTATIN 20 MG PO TABS
20.0000 mg | ORAL_TABLET | Freq: Every evening | ORAL | Status: DC
  Administered 2016-01-24 – 2016-01-28 (×5): 20 mg via ORAL
  Filled 2016-01-23 (×5): qty 1

## 2016-01-23 MED ORDER — LIDOCAINE HCL (CARDIAC) 20 MG/ML IV SOLN
INTRAVENOUS | Status: DC | PRN
  Administered 2016-01-23: 80 mg via INTRAVENOUS

## 2016-01-23 MED ORDER — SUCCINYLCHOLINE CHLORIDE 200 MG/10ML IV SOSY
PREFILLED_SYRINGE | INTRAVENOUS | Status: DC | PRN
  Administered 2016-01-23: 100 mg via INTRAVENOUS

## 2016-01-23 MED ORDER — POLYETHYLENE GLYCOL 3350 17 G PO PACK: 17.0000 g | PACK | Freq: Every day | ORAL | Status: DC | PRN

## 2016-01-23 MED ORDER — PHENOL 1.4 % MT LIQD: 1.0000 | OROMUCOSAL | Status: DC | PRN

## 2016-01-23 MED ORDER — BUPIVACAINE LIPOSOME 1.3 % IJ SUSP
INTRAMUSCULAR | Status: DC | PRN
  Administered 2016-01-23: 20 mL

## 2016-01-23 MED ORDER — FENTANYL CITRATE (PF) 100 MCG/2ML IJ SOLN
INTRAMUSCULAR | Status: AC
  Filled 2016-01-23: qty 2

## 2016-01-23 MED ORDER — MENTHOL 3 MG MT LOZG: 1.0000 | LOZENGE | OROMUCOSAL | Status: DC | PRN

## 2016-01-23 MED ORDER — HYDROMORPHONE HCL 1 MG/ML IJ SOLN
0.2500 mg | INTRAMUSCULAR | Status: DC | PRN
  Administered 2016-01-23 (×3): 0.5 mg via INTRAVENOUS

## 2016-01-23 MED ORDER — KETOROLAC TROMETHAMINE 30 MG/ML IJ SOLN
30.0000 mg | Freq: Once | INTRAMUSCULAR | Status: AC
  Filled 2016-01-23 (×2): qty 1

## 2016-01-23 MED ORDER — MIDAZOLAM HCL 2 MG/2ML IJ SOLN
INTRAMUSCULAR | Status: AC
  Filled 2016-01-23: qty 2

## 2016-01-23 MED ORDER — LOSARTAN POTASSIUM 50 MG PO TABS
100.0000 mg | ORAL_TABLET | Freq: Every day | ORAL | Status: DC
  Administered 2016-01-24 – 2016-01-29 (×6): 100 mg via ORAL
  Filled 2016-01-23 (×6): qty 2

## 2016-01-23 MED ORDER — BUPIVACAINE HCL (PF) 0.5 % IJ SOLN
INTRAMUSCULAR | Status: AC
  Filled 2016-01-23: qty 20

## 2016-01-23 MED ORDER — DEXAMETHASONE SODIUM PHOSPHATE 10 MG/ML IJ SOLN
INTRAMUSCULAR | Status: DC | PRN
  Administered 2016-01-23: 10 mg via INTRAVENOUS

## 2016-01-23 MED ORDER — FENTANYL CITRATE (PF) 100 MCG/2ML IJ SOLN
INTRAMUSCULAR | Status: DC | PRN
  Administered 2016-01-23 (×6): 50 ug via INTRAVENOUS
  Administered 2016-01-23: 100 ug via INTRAVENOUS

## 2016-01-23 MED ORDER — ASPIRIN EC 81 MG PO TBEC
81.0000 mg | DELAYED_RELEASE_TABLET | Freq: Every day | ORAL | Status: DC
  Administered 2016-01-24 – 2016-01-29 (×6): 81 mg via ORAL
  Filled 2016-01-23 (×6): qty 1

## 2016-01-23 MED ORDER — 0.9 % SODIUM CHLORIDE (POUR BTL) OPTIME
TOPICAL | Status: DC | PRN
  Administered 2016-01-23 (×2): 1000 mL

## 2016-01-23 MED ORDER — EPHEDRINE 5 MG/ML INJ
INTRAVENOUS | Status: AC
  Filled 2016-01-23: qty 10

## 2016-01-23 MED ORDER — HYDROMORPHONE HCL 1 MG/ML IJ SOLN
INTRAMUSCULAR | Status: AC
  Filled 2016-01-23: qty 1

## 2016-01-23 MED ORDER — BISACODYL 5 MG PO TBEC
5.0000 mg | DELAYED_RELEASE_TABLET | Freq: Every day | ORAL | Status: DC | PRN
  Administered 2016-01-27: 5 mg via ORAL
  Filled 2016-01-23: qty 1

## 2016-01-23 MED ORDER — MORPHINE SULFATE (PF) 2 MG/ML IV SOLN: 1.0000 mg | INTRAVENOUS | Status: DC | PRN

## 2016-01-23 MED ORDER — THROMBIN 20000 UNITS EX KIT
PACK | CUTANEOUS | Status: DC | PRN
  Administered 2016-01-23: 20 mL via TOPICAL

## 2016-01-23 MED ORDER — ROCURONIUM BROMIDE 10 MG/ML (PF) SYRINGE
PREFILLED_SYRINGE | INTRAVENOUS | Status: AC
  Filled 2016-01-23: qty 10

## 2016-01-23 MED ORDER — LACTATED RINGERS IV SOLN
INTRAVENOUS | Status: DC | PRN
  Administered 2016-01-23: 10:00:00 via INTRAVENOUS

## 2016-01-23 MED ORDER — MIDAZOLAM HCL 5 MG/5ML IJ SOLN
INTRAMUSCULAR | Status: DC | PRN
  Administered 2016-01-23 (×2): 2 mg via INTRAVENOUS

## 2016-01-23 MED ORDER — FLEET ENEMA 7-19 GM/118ML RE ENEM
1.0000 | ENEMA | Freq: Once | RECTAL | Status: DC | PRN
Start: 2016-01-23 — End: 2016-01-29

## 2016-01-23 MED ORDER — SODIUM CHLORIDE 0.9 % IV SOLN
INTRAVENOUS | Status: DC
  Administered 2016-01-24: 02:00:00 via INTRAVENOUS

## 2016-01-23 MED ORDER — PHENYLEPHRINE HCL 10 MG/ML IJ SOLN
INTRAMUSCULAR | Status: DC | PRN
  Administered 2016-01-23 (×5): 80 ug via INTRAVENOUS

## 2016-01-23 MED ORDER — ROCURONIUM BROMIDE 100 MG/10ML IV SOLN
INTRAVENOUS | Status: DC | PRN
  Administered 2016-01-23: 10 mg via INTRAVENOUS
  Administered 2016-01-23: 50 mg via INTRAVENOUS
  Administered 2016-01-23: 10 mg via INTRAVENOUS
  Administered 2016-01-23: 30 mg via INTRAVENOUS
  Administered 2016-01-23: 20 mg via INTRAVENOUS

## 2016-01-23 MED ORDER — DEXAMETHASONE SODIUM PHOSPHATE 10 MG/ML IJ SOLN
INTRAMUSCULAR | Status: AC
  Filled 2016-01-23: qty 1

## 2016-01-23 MED ORDER — SUCCINYLCHOLINE CHLORIDE 200 MG/10ML IV SOSY
PREFILLED_SYRINGE | INTRAVENOUS | Status: AC
  Filled 2016-01-23: qty 10

## 2016-01-23 MED ORDER — CHLORHEXIDINE GLUCONATE 4 % EX LIQD: 60.0000 mL | Freq: Once | CUTANEOUS | Status: DC

## 2016-01-23 MED ORDER — ACETAMINOPHEN 325 MG PO TABS
650.0000 mg | ORAL_TABLET | ORAL | Status: DC | PRN
  Administered 2016-01-25 – 2016-01-28 (×2): 650 mg via ORAL
  Filled 2016-01-23 (×2): qty 2

## 2016-01-23 MED ORDER — VANCOMYCIN HCL IN DEXTROSE 1-5 GM/200ML-% IV SOLN
INTRAVENOUS | Status: AC
  Filled 2016-01-23: qty 200

## 2016-01-23 MED ORDER — ONDANSETRON HCL 4 MG/2ML IJ SOLN
INTRAMUSCULAR | Status: AC
Start: 2016-01-23 — End: 2016-01-23
  Filled 2016-01-23: qty 2

## 2016-01-23 MED ORDER — BUPIVACAINE LIPOSOME 1.3 % IJ SUSP
20.0000 mL | INTRAMUSCULAR | Status: DC
  Filled 2016-01-23: qty 20

## 2016-01-23 MED ORDER — PHENYLEPHRINE 40 MCG/ML (10ML) SYRINGE FOR IV PUSH (FOR BLOOD PRESSURE SUPPORT)
PREFILLED_SYRINGE | INTRAVENOUS | Status: AC
  Filled 2016-01-23: qty 10

## 2016-01-23 MED ORDER — SUGAMMADEX SODIUM 500 MG/5ML IV SOLN
INTRAVENOUS | Status: DC | PRN
  Administered 2016-01-23: 474.4 mg via INTRAVENOUS

## 2016-01-23 MED ORDER — ONDANSETRON HCL 4 MG/2ML IJ SOLN
INTRAMUSCULAR | Status: DC | PRN
  Administered 2016-01-23: 4 mg via INTRAVENOUS

## 2016-01-23 MED ORDER — PHENYLEPHRINE HCL 10 MG/ML IJ SOLN
INTRAMUSCULAR | Status: DC | PRN
  Administered 2016-01-23: 25 ug/min via INTRAVENOUS

## 2016-01-23 MED ORDER — SODIUM CHLORIDE 0.9 % IV SOLN: 250.0000 mL | INTRAVENOUS | Status: DC

## 2016-01-23 MED ORDER — PNEUMOCOCCAL VAC POLYVALENT 25 MCG/0.5ML IJ INJ
0.5000 mL | INJECTION | INTRAMUSCULAR | Status: AC
  Administered 2016-01-25: 0.5 mL via INTRAMUSCULAR
  Filled 2016-01-23: qty 0.5

## 2016-01-23 MED ORDER — ONDANSETRON HCL 4 MG/2ML IJ SOLN: 4.0000 mg | INTRAMUSCULAR | Status: DC | PRN

## 2016-01-23 MED ORDER — TIZANIDINE HCL 2 MG PO TABS
2.0000 mg | ORAL_TABLET | Freq: Two times a day (BID) | ORAL | Status: DC | PRN
  Filled 2016-01-23: qty 2

## 2016-01-23 MED ORDER — PROMETHAZINE HCL 25 MG/ML IJ SOLN
6.2500 mg | INTRAMUSCULAR | Status: DC | PRN
  Administered 2016-01-23: 6.25 mg via INTRAVENOUS

## 2016-01-23 MED ORDER — OXYCODONE-ACETAMINOPHEN 5-325 MG PO TABS
1.0000 | ORAL_TABLET | ORAL | Status: DC | PRN
  Administered 2016-01-24 – 2016-01-28 (×6): 2 via ORAL
  Filled 2016-01-23 (×6): qty 2

## 2016-01-23 MED ORDER — PANTOPRAZOLE SODIUM 40 MG PO TBEC
80.0000 mg | DELAYED_RELEASE_TABLET | Freq: Every day | ORAL | Status: DC
Start: 2016-01-24 — End: 2016-01-29
  Administered 2016-01-24 – 2016-01-29 (×6): 80 mg via ORAL
  Filled 2016-01-23 (×6): qty 2

## 2016-01-23 MED ORDER — LACTATED RINGERS IV SOLN
INTRAVENOUS | Status: DC | PRN
  Administered 2016-01-23 (×3): via INTRAVENOUS

## 2016-01-23 SURGICAL SUPPLY — 80 items
BLADE SURG ROTATE 9660 (MISCELLANEOUS) IMPLANT
BUR MATCHSTICK NEURO 3.0 LAGG (BURR) ×4 IMPLANT
BUR RND FLUTED 2.5 (BURR) IMPLANT
BUR SABER RD CUTTING 3.0 (BURR) IMPLANT
BUR SABER RD CUTTING 3.0MM (BURR)
CAGE BULLET CONCORDE 9X10X27 (Cage) ×3 IMPLANT
CAGE BULLET CONCORDE 9X10X27MM (Cage) ×1 IMPLANT
CAGE CONCORDE BULLET 9X7X27 (Cage) ×4 IMPLANT
CONNECTOR CROSS SFX A2 (Connector) ×4 IMPLANT
CONNECTOR EXPEDIUM TI 55MM (Connector) ×8 IMPLANT
COVER MAYO STAND STRL (DRAPES) ×4 IMPLANT
COVER SURGICAL LIGHT HANDLE (MISCELLANEOUS) ×4 IMPLANT
DERMABOND ADVANCED (GAUZE/BANDAGES/DRESSINGS) ×2
DERMABOND ADVANCED .7 DNX12 (GAUZE/BANDAGES/DRESSINGS) ×2 IMPLANT
DRAIN HEMOVAC 7FR (DRAIN) ×4 IMPLANT
DRAPE C-ARM 42X72 X-RAY (DRAPES) ×8 IMPLANT
DRAPE C-ARMOR (DRAPES) ×4 IMPLANT
DRAPE MICROSCOPE LEICA (MISCELLANEOUS) ×4 IMPLANT
DRAPE SURG 17X23 STRL (DRAPES) ×12 IMPLANT
DRAPE TABLE COVER HEAVY DUTY (DRAPES) ×4 IMPLANT
DRSG MEPILEX BORDER 4X4 (GAUZE/BANDAGES/DRESSINGS) IMPLANT
DRSG MEPILEX BORDER 4X8 (GAUZE/BANDAGES/DRESSINGS) ×4 IMPLANT
DURAPREP 26ML APPLICATOR (WOUND CARE) ×4 IMPLANT
ELECT BLADE 6.5 EXT (BLADE) IMPLANT
ELECT CAUTERY BLADE 6.4 (BLADE) ×4 IMPLANT
ELECT REM PT RETURN 9FT ADLT (ELECTROSURGICAL) ×4
ELECTRODE REM PT RTRN 9FT ADLT (ELECTROSURGICAL) ×2 IMPLANT
EVACUATOR 1/8 PVC DRAIN (DRAIN) IMPLANT
GLOVE BIOGEL PI IND STRL 8 (GLOVE) ×2 IMPLANT
GLOVE BIOGEL PI INDICATOR 8 (GLOVE) ×2
GLOVE ECLIPSE 9.0 STRL (GLOVE) ×4 IMPLANT
GLOVE ORTHO TXT STRL SZ7.5 (GLOVE) ×4 IMPLANT
GLOVE SURG 8.5 LATEX PF (GLOVE) ×4 IMPLANT
GOWN STRL REUS W/ TWL LRG LVL3 (GOWN DISPOSABLE) ×2 IMPLANT
GOWN STRL REUS W/TWL 2XL LVL3 (GOWN DISPOSABLE) ×8 IMPLANT
GOWN STRL REUS W/TWL LRG LVL3 (GOWN DISPOSABLE) ×2
KIT BASIN OR (CUSTOM PROCEDURE TRAY) ×4 IMPLANT
KIT POSITION SURG JACKSON T1 (MISCELLANEOUS) ×4 IMPLANT
KIT ROOM TURNOVER OR (KITS) ×4 IMPLANT
MANIFOLD NEPTUNE II (INSTRUMENTS) ×4 IMPLANT
NEEDLE 22X1 1/2 (OR ONLY) (NEEDLE) ×4 IMPLANT
NEEDLE ASP BONE MRW 11GX15 (NEEDLE) IMPLANT
NEEDLE BONE MARROW 8GAX6 (NEEDLE) IMPLANT
NEEDLE SPNL 18GX3.5 QUINCKE PK (NEEDLE) ×4 IMPLANT
NS IRRIG 1000ML POUR BTL (IV SOLUTION) ×8 IMPLANT
PACK LAMINECTOMY ORTHO (CUSTOM PROCEDURE TRAY) ×4 IMPLANT
PAD ARMBOARD 7.5X6 YLW CONV (MISCELLANEOUS) ×8 IMPLANT
PATTIES SURGICAL .75X.75 (GAUZE/BANDAGES/DRESSINGS) IMPLANT
PATTIES SURGICAL 1X1 (DISPOSABLE) ×4 IMPLANT
PUTTY BONE DBX 2.5 MIS (Bone Implant) ×4 IMPLANT
RASP HELIOCORDIAL MED (MISCELLANEOUS) ×4 IMPLANT
ROD EXPEDIUM PER BENT 65MM (Rod) ×4 IMPLANT
ROD EXPEDIUM PRE BENT 5.5X75 (Rod) ×4 IMPLANT
ROD EXPEDIUM PREBENT 85MM (Rod) ×4 IMPLANT
SCREW CORTICAL VIPER 7X40MM (Screw) ×12 IMPLANT
SCREW CORTICAL VIPER 7X45MM (Screw) ×8 IMPLANT
SCREW SET SINGLE INNER (Screw) ×24 IMPLANT
SHAFT TORQUE X25 (MISCELLANEOUS) ×4 IMPLANT
SPONGE LAP 4X18 X RAY DECT (DISPOSABLE) IMPLANT
SPONGE SURGIFOAM ABS GEL 100 (HEMOSTASIS) ×4 IMPLANT
SURGIFLO W/THROMBIN 8M KIT (HEMOSTASIS) IMPLANT
SUT VIC AB 0 CT1 27 (SUTURE) ×2
SUT VIC AB 0 CT1 27XBRD ANBCTR (SUTURE) ×2 IMPLANT
SUT VIC AB 1 CTX 36 (SUTURE) ×4
SUT VIC AB 1 CTX36XBRD ANBCTR (SUTURE) ×4 IMPLANT
SUT VIC AB 2-0 CT1 27 (SUTURE) ×2
SUT VIC AB 2-0 CT1 TAPERPNT 27 (SUTURE) ×2 IMPLANT
SUT VIC AB 3-0 X1 27 (SUTURE) ×4 IMPLANT
SUT VICRYL 0 CT 1 36IN (SUTURE) ×8 IMPLANT
SYR 20CC LL (SYRINGE) ×4 IMPLANT
SYR CONTROL 10ML LL (SYRINGE) ×8 IMPLANT
TAP CANN VIPER2 DL 5.0 (TAP) ×4 IMPLANT
TAP CANN VIPER2 DL 6.0 (TAP) ×4 IMPLANT
TAP CANN VIPER2 DL 7.0 (TAP) ×4 IMPLANT
TAP VIPER MIS 4.35MM (TAP) ×4 IMPLANT
TOWEL OR 17X24 6PK STRL BLUE (TOWEL DISPOSABLE) ×4 IMPLANT
TOWEL OR 17X26 10 PK STRL BLUE (TOWEL DISPOSABLE) ×4 IMPLANT
TRAY FOLEY CATH 16FRSI W/METER (SET/KITS/TRAYS/PACK) ×4 IMPLANT
WATER STERILE IRR 1000ML POUR (IV SOLUTION) ×4 IMPLANT
YANKAUER SUCT BULB TIP NO VENT (SUCTIONS) ×4 IMPLANT

## 2016-01-23 NOTE — Anesthesia Postprocedure Evaluation (Signed)
Anesthesia Post Note  Patient: [name]  Procedure(s) Performed: Procedure(s) (LRB): TRANSFORAMINAL LUMBAR INTERBODY FUSION (TLIF) WITH PEDICLE SCREW FIXATION 2 LEVEL WITH HARDWARE REMOVAL AND EXTENSION OF HARDWARE. (N/A)  Patient location during evaluation: PACU Anesthesia Type: General Level of consciousness: awake Pain management: pain level controlled Vital Signs Assessment: post-procedure vital signs reviewed and stable Respiratory status: spontaneous breathing Cardiovascular status: stable Postop Assessment: no signs of nausea or vomiting Anesthetic complications: no    Last Vitals:  Vitals:   01/23/16 1905 01/23/16 2015  BP: 116/75   Pulse: (!) 51   Resp: (!) 8   Temp:  36.2 C    Last Pain:  Vitals:   01/23/16 1810  TempSrc:   PainSc: Asleep                 Shabrea Weldin

## 2016-01-23 NOTE — Transfer of Care (Signed)
Immediate Anesthesia Transfer of Care Note  Patient: [name]  Procedure(s) Performed: Procedure(s) with comments: TRANSFORAMINAL LUMBAR INTERBODY FUSION (TLIF) WITH PEDICLE SCREW FIXATION 2 LEVEL WITH HARDWARE REMOVAL AND EXTENSION OF HARDWARE. (N/A) - L1-L3   Patient Location: PACU  Anesthesia Type:General  Level of Consciousness: awake  Airway & Oxygen Therapy: Patient Spontanous Breathing and Patient connected to face mask oxygen  Post-op Assessment: Report given to RN and Post -op Vital signs reviewed and stable  Post vital signs: Reviewed and stable  Last Vitals:  Vitals:   01/23/16 0629  BP: (!) 153/71  Pulse: 60  Resp: 20  Temp: 36.7 C    Last Pain:  Vitals:   01/23/16 0629  TempSrc: Oral         Complications: No apparent anesthesia complications

## 2016-01-23 NOTE — Op Note (Signed)
01/23/2016  5:24 PM  PATIENT:  [name]  70 y.o. 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    SURGEON:  Jessy Oto, MD     ASSISTANT:  Benjiman Core, PA-C  (Present throughout the entire procedure and necessary for completion of procedure in a timely manner)     ANESTHESIA:  General,supplemented with local marcaine 0.5% 1:1 exparel 1.3% total 30 cc  Dr. Larence Penning. [place].     COMPLICATIONS:  None.  DRAINS: Foley to SD. Hemovac x 1 right lower lumbar.  EBL: 700cc  CELL SAVER RETURNED: 325CC     COMPONENTS: Implant Name Type Inv. Item Serial No. Manufacturer Lot No. LRB No. Used  SCREW CORTICAL VIPER 7X40MM - [id number] Screw SCREW CORTICAL VIPER 7X40MM  DEPUY SPINE  N/A 3  SCREW CORTICAL VIPER 7X45MM - [id number] Screw SCREW CORTICAL VIPER 7X45MM  DEPUY SPINE  N/A 2  PUTTY 2.5 DBX MIS - [id number] Bone Implant PUTTY 2.5 DBX MIS [id number] MUSCULOSKELETL TRANSPLANT FNDN 8711 N/A 1  CAGE BULLET CONCORDE 9X10X27MM - [id number] Cage CAGE BULLET CONCORDE 9X10X27MM  DEPUY SPINE  N/A 1  CAGE CONCORDE BULLET 9X7X27 - [id number] Cage CAGE CONCORDE BULLET [id number]  DEPUY SPINE  N/A 1  ROD EXPEDIUM PRE BENT 5.5X75 - [id number] Rod ROD EXPEDIUM PRE BENT 5.5X75  DEPUY SPINE  N/A 1  CONNECTOR EXPEDIUM TI 55MM - [id number] Connector CONNECTOR EXPEDIUM TI 55MM  DEPUY SPINE  N/A 2  ROD EXPEDIUM PER BENT 65MM - [id number] Rod ROD EXPEDIUM PER BENT 65MM  DEPUY SPINE  N/A 1  SCREW SET SINGLE INNER - [id number] Screw SCREW SET SINGLE INNER  DEPUY SPINE  N/A 6  ROD EXPEDIUM  PREBENT 85MM - [id number] Rod ROD EXPEDIUM PREBENT 85MM   DEPUY SPINE   N/A 1    FINDINGS:Severe lumbar spinal stenosis at the L2-3 level adjacent to L3 to S1 fusion. Retained hardware L3 to L5 pedicle screws and rods. Moderate spinal stenosis L1-2 with degenerative disc disease.  Retrolisthesis L2-3 grade one.    PROCEDURE:    The patient was met in the holding area, and the appropriate Right L1-2 and L2-3 lumbar levels identified and marked with an "X" and my initials. I had discussion with the patient in the preop holding area regarding consent form. Patient understands the rationale for the fusion site as the L1-2 and L2-3 segment For stenosis and degenerative spondylolisthesis with recurrent herniated disc right L2-3 above previous fusion L3 to L5  The patient was then transported to OR and was placed under general anestheticwithout difficulty. The patient received appropriate preoperative antibiotic prophylaxis vancomycin for penicillin allergy. . Nursing staff inserted a Foley catheter under sterile conditions. The patient was then turned to a prone position using the [place] spine frame. PAS. all pressure points well padded the arms at the side to 90 90. Standard prep with DuraPrep solution draped in the usual manner from the lower dorsal spine the mid sacral segment. Iodine Vi-Drape was used and the incision was marked. Time-out procedure was called and correct. Loupe magnification and headlight were used during this portion procedure.  Skin in the midline between L2and L4  was then infiltrated with local marcaine 0.5% 1:1 exparel 1.3% total of 30 cc used. Incision was then made through the skin and subcutaneous layers down to the patient's lumbodorsal fascia and spinous processes. The incision then carried sharply excising the supraspinous ligament and then continuing the lateral aspect of the spinous processes T12,L1 and L2 and carried lateral over the lateral facets at L2-3 and L4 and L5.Belenda Cruise  elevators used to carefully elevate the paralumbar muscles off of the posterior elements using electrocautery carefully drilled bleeding and perform dissection of the muscle tissues of the preserving the facet at T12-L1. Continuing the exposure out laterally to expose the retained pedicle screws and rods at L3, L4 and  L5. Bleeding controlled using electrocautery monopolar electrocautery.  Viper retractor was used for the upper part of the incision.  C-arm fluoroscopy was then brought into the field and using C-arm fluoroscopy then a hole made into the lateral aspect of the right pedicle of L2 observed in the pedicle using ball-tipped nerve hook and hockey stick nerve probe initial entry was determined on fluoroscopy to be good position alignment so that a ball handled probe was then used to probe the right L2 pedicle to a depth of nearly 45 mm observed on C-arm fluoroscopy to be beyond the midpoint of the lumbar vertebra and then position alignment within the right L2 pedicle this was then removed and the pedicle channel probed demonstrating patency no sign of rupture the cortex of the pedicle. Tapping with a 5 mm screw then 6.0 and then a 7 mm tap, a 7.0 mm x 45 mm screw was placed on the table for use st the right side at the L2 level following TLIF. C-arm fluoroscopy was then brought into the field and using C-arm fluoroscopy then a hole made into the medial  aspect of the pedicle of L2 observed in the pedicle using ball tipped nerve hook and hockey stick nerve probe initial entry was determined on fluoroscopy to be good position alignment so that a ball handled probe was then used to probe the left L2 pedicle opening to a depth of nearly 45 mm observed on C-arm fluoroscopy to be beyond the midpoint of the lumbar vertebra and then position alignment within the left L2 pedicle this was then removed and the pedicle channel probed demonstrating patency no sign of rupture the cortex of the pedicle. Tapping with a  6 mm screw tap then a 7.0 mm tap, a 7.0 mm x 107m screw was placed on the left side at the L2 level. C-arm fluoroscopy then a hole made into the medial aspect of the right pedicle of L1 observed in the pedicle using ball tipped nerve hook and hockey stick nerve probe initial entry was determined on fluoroscopy to be good position alignment so that a ball handled probe was then used to probe the right L1 pedicle to a depth of nearly 40 mm observed on C-arm fluoroscopy to be well aligned within the right L1 pedicle, the pedicle channel probed demonstrating patency no sign of rupture the cortex of the pedicle. Tapping with a 6 mm screw tap and then a 7.0 mm tap then 7.0 mm x 40 mm screw was placed  on the right side at the L1 level. C-arm fluoroscopy was used to localize the hole made in the medial aspect of the pedicle of L1 on the left localizing the pedicle within the spinal canal with nerve hook and hockey-stick nerve probe carefully passed down the center  of the L1 pedicle to a depth of nearly 40 mm. Observed on C-arm fluoroscopy to be in good position alignment channel was probed with a ball-tipped probe ensure patency no sign of cortical disruption. Following tapping with a 5 mm tap and a 62m tap and a  7.0 mm tap a 7.0 x 40 mm screw was placed  on the left side at the L1 level.  The right inferior articular process of L1and L2 were resected in order to provide for exposure of the right side L1-2 and L2-3 neuroforamen for ease of placement of TLIFs (transforaminal lumbar interbody fusion) essential portions of the lamina were also resected first beginning with the Leksell rongeur and then resecting using 2 and 3 mm Kerrison the central and right portions of the lamina of L1 and L2 performing foraminotomies on the right side at the L3 level. The inferior articular process L1 and L2 were resected on the right side. The L3 nerve root identified bilaterally and the medial aspect of the L3 pedicle. Superior  articular process of L3 was then resected from the right side further decompressing the right L3 nerve and providing for exposure of the area just superior to the L3 pedicle for a placement of cage. The OR microscope was draped sterilely and brought into the field to allow for freeing up of the right side of the thecal sac at the L2-3 Level elevating the L3 nerve root away from the medial right L3 pedicle, exposing the right L2-3 disc above the right L3 pedicle, Incising the disc and then resecting the disc with micropituitary and then pituitary with teeth.The right side recut resecting the superior articular process of L3 overlying the L2 nerve root as it exited at the L2-3 level decompressing the lateral recess along the medial aspect of the pedicle of L3 and resecting the superior to the process of L3 and decompressing the right L2 neuroforamen.The disc herniation did continue subligamentous along the right side of the Posterior upper vertebral body of L3 ventral to the right L3 nerve root. The posterior longitudinal ligament was incised and the disc herniation material resected with nerve hook and micropituitary. A blunt nerve hook used to explore the spinal canal and ensure Both the right L3 and L2 nerve roots were well decompressed.At the L2 level similarly lateral recess and the neuroforamen were resected decompressing the L1and L2 nerve roots and foraminotomies was widely performed over the right L1 nerve and L2 nerve roots.  Attention then turned to placement of the right transforaminal lumbar interbody fusion cages. Using a Penfield 4 the lateral aspect of the thecal sac and the inferior aspect of the L3 nerve root on the right side at the L2-3 level was carefully drilled. The thecal sac could then easily be retracted in the posterior lateral aspect of the L2-3 disc was exposed 15 blade scalpel used to incise the osteotome used to resect a small portion of bone off the superior aspect of the posterior  superior vertebral body of L2 in order to ease the entry into the L2-3 disc space. A pituitary rongeur was then able to be introduced in the disc space debrided it of degenerative disc material. 7 mm dilator was used to dialate  the L2-3 disc space on the right side attempts were made to dilate further in increments to 928mand then 10 mm successfully and using small curettes and the disc space was debrided a minimal degenerative disc present in the endplates debrided to bleeding endplate bone.Trial  cage placement within the disc place could only allow for an 10 mm cage. An 10 mm lordotic Concorde cage was carefully packed with morcellized bone graft and the been harvested from previous laminotomies additional bone graft local morselized was then packed into the intervertebral disc space using the 8 mm trial to impacted the graft multiple times. With this then a 10 mm x 27 mm lorditic cage was introdudced into the disc space on the right side in the correct degree convergence and then impacted then subset beneath the posterior aspect of the disc space by about 3 or 4 mm. Bleeding controlled using bipolar electrocautery thrombin soaked gel cottonoids. Then turned to the right L1-2 level similarly the exposure the posterior lateral aspect this was carried out using a Penfield 4 bipolar electrocautery to control small bleeders present. Derricho retractor used to retract the thecal sac and nerve root, a 15 blade scalpel was used to incise posterior lateral aspect of the disc at the L1-2 disc space.The space was debrided of degenerative disc material using pituitary along root the entire disc space was then debrided of degenerative disc material using pituitary rongeurs curettage down to bleeding bone endplates. Residual disc was resected using pituitary. This space was then carefully sounded to a 7 mm cage trial provided the best fit the lordotic cage was chosen the 7 mm x 27 mm. The intervertebral disc space was then  packed with autogenous local bone graft that been harvested from the central laminectomy, cancellous allograft chips and vivigen and the trial was used to pack the graft using an 50m trial cage. This provided excellent bone graft within the intervertebral disc space at L1-2 so that the permanent 9 mm cage by 27 mm was then packed with local bone graft placed into the intervertebral disc space and impacted into place in the correct degree of convergence. Bleeding controlled using bipolar electrocautery. The cage was subset beneath the posterior aspect of the L1-2 disc space margin by about 3-4 mm.   Bleeding was hemostasis attention was turned to the placement of the rods and Rod sleeves . The exposed retained hardware left L3 to L5 pedicle screw fasteners and rods and the right L3-L4..Marland KitchenThere was not enough space between the fasteners at L3-L4 and L4-5 on the left to allow for placement of a connector sleeve so the  the caps and cap fasteners at the left L3 to L5 were removed.The left L3-5 rod removed. The next 3.5 hours were spent removing the left sided pedicle screws at L3, L4 and L5. These were Monarch pedicle screws and the T-15 star driver for removal of the screws was available but unfortunately each of the star openings into the screw heads stripped and thus began a difficult screw removing process. The lowest screw left L5 loosened and was able to be removed nearly intact with the T-15 star screw driver but the left L4 and L3 screws resisted removal with easy outs and various types of screw drivers being trialled. Finally a metal cutting fluted burr was used to slot the head of the left L4 pedicle screw and a flat screw driver able to engage the head and back it out, but this required multiple attempts and also attempts at the use of the conical reverse threaded easy out. The screw finally was able to be removed. The left L3 screw was nearly as difficult at the left L4 pedicle screw with attempts at  using an easy out allowing about 2-3 turns of the  screw. Unfortunately the tip of the conical reverse threaded easy out broke within the head of the left L3 pedicle screws and a new metal cutting burr was necessary to allow for removal of the left L3 pedicle screw. Eventually  The screw head was drilled off with the metal cutting fluted drill and the shank of the screw was able to be partially backed out with a screw gripping locking pliers and then the reverse threaded hollow easy out. The incision irrigated with copious amounts of irrigant. The left L4 pedicle screw marked as a 6.40m x 40 mm screw so that the trajectory of the left L4 pedicle opening was sounded with a ball tipped probe and then tapped with 7.0 mm tap and a 7.0 x 466mcorticotrajectory screw was insert in the left L4 pedicle obtaining excellent purchase.   The right side retained rods and screws at the L3 to L5 kept intact and the rod and pedicle screws on the right L3 and L4 exposed to accept a rod sleeve connector on the right side L3-4 level. With the transforaminal lumbar interbody fusion portion of the case  completed bleeders were controlled using bipolar electrocautery thrombin-soaked Gelfoam were appropriate.The L3 pedicle screw on the right and then carefully placed and aligned to allow for placement of rods. The 8547mrebent rod was then contoured using a template on the left side and  placed into the pedicle screws on the left extending from L4 pedicle screw into each of the left L2 and L1 pedicle fastener and caps carefully placed loosely tightened. Attention turned to the right side were similarly screws were carefully adjusted to allow for a better pattern screws to allow for placement of fixation rod template for the rod was then taken and a precontoured quarter inch 7m63mtanium rod was placed. The rod placed into the connector sleeve at the L3-4 level and reduced into the right L2 and L1 pedicle fasteners. Both rod connector  sleeve screws were tightened to 80 foot lbs. The right L1 rod fastener cap was then tightened 85 pounds similarly on  At the right side disc space at L1-2 screw fasteners distracted to compress the L2-3 disc Posteriorly and the L2 fastener cap was torqued to 85 foot lbs, the right side between L1 and L2 then compressed by loosening the cap at the L1 level, using the compressor and tightening the right L1 cap to 85 foot lbs.  Similarly this was done on the left side at L1-2 and L4-5 sleeve screws were compressed and tightened 85 pounds. A cross link was then caliper sized at the L1-2 level and measured as an A-2 size. This was then installed at the rod area between the L1 and L2 fasteners and each the the hook clamps tightened to 80 mm HG and then the central coupling clamp tightened to 80 foot lbs. Irrigation was carried out with copious amounts of saline solution this was done throughout the case. Cell Saver was used during the case.Permanent C-arm images were obtained in AP and lateral and oblique planes. Remaining local bone graft was then applied along the left and right lateral posterior lateral region extending from L1 through L3 posterior facets following decortication of the facets and along the left L1-2 interlaminar area posteriorly. Excess Gelfoam was then removed the lumbodorsal musculature carefully exam debrided of any devitalized tissue following removal of Vicryl retractors were the bleeders were controlled using electrocautery and the area dorsal lumbar muscle were then approximated in the midline with  interrupted #1 Vicryl sutures loose the dorsal fascia was reattached to the spinous process of T12, L1 and L2 to superiorly and  inferiorly this was done with #1 Vicryl sutures. The para lumbar muscle approximated over the lower incision site with #1 vicryl A Hemovac drain was placed along the right posterior elements exiting over the right lower lumbar spine. The subcutaneous layers then  approximated over the drain using interrupted 0 Vicryl sutures and 2-0 Vicryl sutures. Skin was closed with a running subcutaneous stitch of 4-0 Vicryl Dermabond was applied then MedPlex bandage. All instrument and sponge counts were correct. The patient was then returned to a supine position on her bed reactivated extubated and returned to the recovery room in satisfactory condition.    Benjiman Core, PA-C perform the duties of assistant surgeon during this case. He was present from the beginning of the case to the end of the case assisting in transfer the patient from his stretcher to the OR table and back to the stretcher at the end of the case. Assisted in careful retraction and suction of the laminectomy site delicate neural structures operating under the operating room microscope. He performed closure of the incision from the fascia to the skin applying the dressing.      NITKA,[name] E  01/23/2016, 5:24 PM

## 2016-01-23 NOTE — Brief Op Note (Signed)
PATIENT ID:      Nicky Pughallis L Korman  MRN:     [id number] DOB/AGE:    69/27/1947 / 69 y.o.       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    PROCEDURE:  Procedure(s): TRANSFORAMINAL LUMBAR INTERBODY FUSION (TLIF) WITH PEDICLE SCREW FIXATION 2 LEVEL WITH HARDWARE REMOVAL AND EXTENSION OF HARDWARE.    SURGEON: Zaylei Mullane E   ASSISTANT: Andee LinemanJames Owens,PA-C          ANESTHESIA: General and supplemented with local marcaine 0.5% 1:1 exparel 1.3% total  30cc. Dr. Noreene LarssonJoslin,/ Dr. Jacklynn BueMassagee.  EBL:750CC  DRAINS:Hemovac right lower lumbar, foley to SD.   COMPONENTS:   Implant Name Type Inv. Item Serial No. Manufacturer Lot No. LRB No. Used  SCREW CORTICAL VIPER 7X40MM - [id number] Screw SCREW CORTICAL VIPER 7X40MM  DEPUY SPINE  N/A 3  SCREW CORTICAL VIPER 7X45MM - [id number] Screw SCREW CORTICAL VIPER 7X45MM  DEPUY SPINE  N/A 2  PUTTY 2.5 DBX MIS - [id number] Bone Implant PUTTY 2.5 DBX MIS [id number] MUSCULOSKELETL TRANSPLANT FNDN 8711 N/A 1  CAGE BULLET CONCORDE 9X10X27MM - [id number] Cage CAGE BULLET CONCORDE 9X10X27MM  DEPUY SPINE  N/A 1  CAGE CONCORDE BULLET 9X7X27 - [id number] Cage CAGE CONCORDE BULLET 9X7X27  DEPUY SPINE  N/A 1  ROD EXPEDIUM PRE BENT 5.5X75 - [id number] Rod ROD EXPEDIUM PRE BENT 5.5X75  DEPUY SPINE  N/A 1  CONNECTOR EXPEDIUM TI 55MM - [id number] Connector CONNECTOR EXPEDIUM TI 55MM  DEPUY SPINE  N/A 2  ROD EXPEDIUM PER BENT 65MM - [id number] Rod ROD EXPEDIUM PER BENT 65MM  DEPUY SPINE   N/A 1  SCREW SET SINGLE INNER - [id number] Screw SCREW SET SINGLE INNER  DEPUY SPINE  N/A 6  ROD EXPEDIUM PREBENT 85MM - [id number] Rod ROD EXPEDIUM PREBENT 85MM   DEPUY SPINE   N/A 1   FINDINGS: Severe lumbar spinal stenosis L2-3 with retrolisthesis, moderate spinal stenosis L1-2 with DDD.  COMPLICATIONS:  None   CONDITION:  stable    Janesia Joswick E 01/23/2016, 4:56 PM

## 2016-01-23 NOTE — H&P (Signed)
[name] is an 70 y.o. female.   This patient returns today in followup of her back.  She has been experiencing problems with persistent ongoing discomfort in her back.  Relates that her pain in her back is no better.  She states that she is at a point where she is in tears, crying at night and during the daytime because of severe ongoing back pain.  The results of her MRI scan are available for review.  She has been experiencing pain into her back, radiation into her right leg greater than left.  Pain into her mid-lumbar spine that is transverse, pain that is over the right back and over the lateral aspect of the right thigh and calf.  Reports that she cannot walk a mile.  She cannot walk more than just a few hundred feet at the most.  Has discomfort such that she is unable to even grocery shop.  She is leaning on carts to try and get around while in a grocery store.  The pain is present when she is standing in one place.  Sitting, she also experiences discomfort.  Her clinical exam does not show a focal neurologic deficit.  She has had previous lumbar fusion surgery at L4-5 and at L3-4.  Most of her pain is in her back but also some radiation in her buttocks and thighs.  She has limitations with standing, walking.  Cannot walk a mile.  She only walks short distance.  Not able to grocery shop.  Has to use a cart, an electrical cart, when she goes to grocery shop and also leans on the cart for support.  Standing in one place is uncomfortable, too.  Most of the pain is into the thighs posteriorly and in the sides of her thighs.  Clinically has decreased hip flexion strength on the right side, but no other focal deficit.          Past Medical History:  Diagnosis Date  . Arthritis   . Cataract of right eye   . Diastolic dysfunction   . GERD (gastroesophageal reflux disease)   . Hyperlipidemia   . Hypertension   . Pancreatic cyst 02/2015   Recommend follow-up MRI in 12 months.    Past Surgical  History:  Procedure Laterality Date  . ABDOMINAL HYSTERECTOMY    . ANTERIOR CERVICAL DECOMP/DISCECTOMY FUSION N/A 07/20/2012   Procedure: ANTERIOR CERVICAL DISCECTOMY FUSION C5-6, C6-7 with transgraft(Alphatec), local bone graft, plate and screws ;  Surgeon: Kerrin ChampagneJames E Rylin Saez, MD;  Location: [facility] OR;  Service: Orthopedics;  Laterality: N/A;  . BACK SURGERY    . COLONOSCOPY    . ESOPHAGOGASTRODUODENOSCOPY    . NECK SURGERY    . REPLACEMENT TOTAL KNEE BILATERAL    . THYROIDECTOMY, PARTIAL      Family History  Problem Relation Age of Onset  . Ovarian cancer Mother   . Heart disease Mother   . Prostate cancer Father   . Heart disease Father   . Colon cancer Father   . Breast cancer Sister   . Prostate cancer Brother   . Cystic fibrosis Sister   . Heart disease Sister   . Pancreatic cancer Neg Hx   . Rectal cancer Neg Hx   . Stomach cancer Neg Hx    Social History:  reports that she has never smoked. She has never used smokeless tobacco. She reports that she does not drink alcohol or use drugs.  Allergies:  Allergies  Allergen Reactions  .  Lisinopril Swelling    Swelling in mouth and throat  . Penicillins Rash    Has patient had a PCN reaction causing immediate rash, facial/tongue/throat swelling, SOB or lightheadedness with hypotension: No Has patient had a PCN reaction causing severe rash involving mucus membranes or skin necrosis: No Has patient had a PCN reaction that required hospitalization No Has patient had a PCN reaction occurring within the last 10 years: No If all of the above answers are "NO", then may proceed with Cephalosporin use.   . Tramadol Hcl Rash    Medications Prior to Admission  Medication Sig Dispense Refill  . aspirin EC 81 MG tablet Take 81 mg by mouth daily.    [name] gabapentin (NEURONTIN) 300 MG capsule Take 600 mg by mouth 3 (three) times daily.    [name] HYDROcodone-acetaminophen (NORCO) 7.5-325 MG tablet Take 1 tablet by mouth every 6 (six) hours as needed  for pain.    [name] losartan (COZAAR) 100 MG tablet Take 100 mg by mouth daily.     . Multiple Vitamins-Minerals (MULTIVITAMINS THER. W/MINERALS) TABS Take 1 tablet by mouth daily.    [name] omeprazole (PRILOSEC) 40 MG capsule Take 40 mg by mouth 2 (two) times daily.    . simvastatin (ZOCOR) 20 MG tablet Take 20 mg by mouth every evening.  0  . triamterene-hydrochlorothiazide (MAXZIDE-25) 37.5-25 MG tablet Take 0.5 tablets by mouth daily.   5  . BIOTIN PO Take 1 capsule by mouth daily.    . cyclobenzaprine (FLEXERIL) 10 MG tablet Take 1 tablet (10 mg total) by mouth 3 (three) times daily as needed for muscle spasms. (Patient not taking: Reported on 01/16/2016) 15 tablet 0  . oxyCODONE-acetaminophen (PERCOCET/ROXICET) 5-325 MG tablet Take 1 tablet by mouth every 6 (six) hours as needed for severe pain. (Patient not taking: Reported on 01/16/2016) 7 tablet 0  . tiZANidine (ZANAFLEX) 2 MG tablet Take 1-2 tablets by mouth 2 (two) times daily as needed for muscle spasms.  0    No results found for this or any previous visit (from the past 48 hour(s)). No results found.  Review of Systems  Constitutional: Negative.   HENT: Negative.   Eyes: Negative.   Respiratory: Negative.   Cardiovascular: Negative.   Gastrointestinal: Negative.   Genitourinary: Negative.   Musculoskeletal: Positive for back pain.  Skin: Negative.   Neurological: Positive for tingling.  Psychiatric/Behavioral: Negative.     Blood pressure (!) 153/71, pulse 60, temperature 98 F (36.7 C), temperature source Oral, resp. rate 20, SpO2 99 %. Physical Exam  Constitutional: She is oriented to person, place, and time. No distress.  HENT:  Head: Normocephalic and atraumatic.  Eyes: EOM are normal. Pupils are equal, round, and reactive to light.  Neck: Normal range of motion.  Respiratory: No respiratory distress.  GI: She exhibits no distension.  Musculoskeletal: She exhibits tenderness.  Neurological: She is alert and oriented to  person, place, and time.  Skin: Skin is warm and dry.  Psychiatric: She has a normal mood and affect.   RADIOGRAPHS:  The results of the most recent MRI scan with and without enhancement show progressive changes occurring at the cranial adjacent segment at L2-3.  Progressive spondylosis with moderately severe central canal stenosis.  Moderate bilateral foraminal narrowing also identified.  Foraminal narrowing not able to be totally assessed due to blurring of the images from pedicle screws at the L3 level.  Mild central narrowing also at L1-2.  At T12-L1 a tiny right paracentral disk  protrusion without stenosis.  There is a fluid collection over the posterior laminotomy area at L4-5, consistent with seroma.  L5-S1, the left facet joint is ankylosed.  Disk appears to be narrowed and calcified.  Her MRI scan is reviewed with her.  There is loss of normal lordosis over the upper lumbar segments above an L3-L5 fusion with pedicles screws and rods at L3-L5.  She has adjacent level degenerative changes occurring at the L2-3 level with retrolisthesis of L1 and L2 and L2 and L3.  Degenerative disk narrowing at L1-2 that is more severe than that at the L2-3 level.  Lateral recess narrowing is evident on the right side as well as foraminal entrapment present due to a collapsing degenerative disk that is asymmetrically collapsing on the right side and entrapping the right L2-3 neural foramen and affecting the L2 nerve root.  This likely relates to her weakness in her right hip flexion.  Her plain radiographs of her pelvis do show some mild degenerative changes of her hip joints bilaterally, right and left side.  The lumbar MRI scan does suggest rather severe central and subarticular lateral recess stenosis occurring at the L2-3 level and above the L3-4 and L4-5 fusion sites.   ASSESSMENT:  Lumbar spinal stenosis at L1-2, L2-3.  Collapse of disk spaces at both of these segments with loss of lordosis through this upper  lumbar segment area.  The patient with worsening neurogenic claudication symptoms, worsening of diskogenic pain as well, above a 2-level fusion, consistent with adjacent level degenerative disease and spinal stenosis.    PLAN:  I discussed with [name] surgical intervention, as she has been treated conservatively and has not been able to obtain significant relief with a course of epidural steroid and exercise program at home.  I do not believe that the spinal stenosis findings immediately adjacent to her fusion segments will change and I do think that with the degree of collapse of her disk spaces and asymmetric collapse with right-sided neural foramen being narrowed, decompression and fusion is the best way to deal with this and I would recommend then that we go ahead and schedule her for decompression and TLIFs on the right side at both L1-2 and L2-3.  Would use an mPACT technique, placing the screws first and then use rod/sleeve connectors to connect to the old fusion rods that extend from L3 to L4 and 5.  The risks of surgery include risk of infection, bleeding, and risk to the nerves themselves.  She is taking Flexeril and oxycodone so that I do not think that medications any stronger would really be of much benefit here in trying to relieve her discomfort.  We will go ahead and schedule her for intervention and plan to see her at the time of intervention.     Naida Sleight, PA-C 01/23/2016, 7:15 AM  Patient examined and lab reviewed with Barry Dienes, PA-C.

## 2016-01-23 NOTE — Anesthesia Procedure Notes (Signed)
Procedure Name: Intubation Date/Time: 01/23/2016 7:42 AM Performed by: Gavin PoundLOWDER, Jaicion Laurie J Pre-anesthesia Checklist: Patient identified, Emergency Drugs available, Suction available, Patient being monitored and Timeout performed Patient Re-evaluated:Patient Re-evaluated prior to inductionOxygen Delivery Method: Circle system utilized Preoxygenation: Pre-oxygenation with 100% oxygen Intubation Type: IV induction Ventilation: Mask ventilation without difficulty Laryngoscope Size: Mac and 3 Grade View: Grade I Tube type: Oral Tube size: 7.0 mm Number of attempts: 1 Placement Confirmation: ETT inserted through vocal cords under direct vision,  positive ETCO2 and breath sounds checked- equal and bilateral Secured at: 21 cm Tube secured with: Tape Dental Injury: Teeth and Oropharynx as per pre-operative assessment

## 2016-01-23 NOTE — Progress Notes (Signed)
Care of pt assumed by MA Ahnna Dungan RN 

## 2016-01-23 NOTE — Interval H&P Note (Signed)
Patient was seen and examined in the preop holding area. There has been no interval  Change in this patient's exam preop  history and physical exam  Lab tests and images have been examined and reviewed.  The Risks benefits and alternative treatments have been discussed  extensively,questions answered.  The patient has elected to undergo the discussed surgical treatment. 

## 2016-01-23 NOTE — Anesthesia Preprocedure Evaluation (Signed)
Anesthesia Evaluation  Patient identified by MRN, date of birth, ID band Patient awake    Reviewed: Allergy & Precautions, NPO status , Patient's Chart, lab work & pertinent test results  History of Anesthesia Complications Negative for: history of anesthetic complications  Airway Mallampati: II  TM Distance: >3 FB Neck ROM: Full    Dental  (+) Edentulous Upper   Pulmonary neg pulmonary ROS,    breath sounds clear to auscultation       Cardiovascular hypertension,  Rhythm:Regular Rate:Normal     Neuro/Psych  Neuromuscular disease    GI/Hepatic Neg liver ROS, GERD  ,  Endo/Other  negative endocrine ROS  Renal/GU negative Renal ROS     Musculoskeletal  (+) Arthritis , Back pain   Abdominal   Peds  Hematology negative hematology ROS (+)   Anesthesia Other Findings   Reproductive/Obstetrics                             Anesthesia Physical Anesthesia Plan  ASA: II  Anesthesia Plan: General   Post-op Pain Management:    Induction: Intravenous  Airway Management Planned: Oral ETT  Additional Equipment:   Intra-op Plan:   Post-operative Plan: Extubation in OR  Informed Consent: I have reviewed the patients History and Physical, chart, labs and discussed the procedure including the risks, benefits and alternatives for the proposed anesthesia with the patient or authorized representative who has indicated his/her understanding and acceptance.   Dental advisory given  Plan Discussed with: CRNA  Anesthesia Plan Comments:         Anesthesia Quick Evaluation

## 2016-01-24 ENCOUNTER — Encounter (HOSPITAL_COMMUNITY): Payer: Self-pay | Admitting: Specialist

## 2016-01-24 LAB — BASIC METABOLIC PANEL
Anion gap: 9 (ref 5–15)
BUN: 11 mg/dL (ref 6–20)
CALCIUM: 8.2 mg/dL — AB (ref 8.9–10.3)
CO2: 21 mmol/L — ABNORMAL LOW (ref 22–32)
CREATININE: 0.94 mg/dL (ref 0.44–1.00)
Chloride: 105 mmol/L (ref 101–111)
GFR calc Af Amer: >60 mL/min (ref >60)
Glucose, Bld: 178 mg/dL — ABNORMAL HIGH (ref 65–99)
POTASSIUM: 4 mmol/L (ref 3.5–5.1)
SODIUM: 135 mmol/L (ref 135–145)

## 2016-01-24 LAB — CBC
HCT: 30.1 % — ABNORMAL LOW (ref 36.0–46.0)
Hemoglobin: 9.5 g/dL — ABNORMAL LOW (ref 12.0–15.0)
MCH: 25.6 pg — AB (ref 26.0–34.0)
MCHC: 31.6 g/dL (ref 30.0–36.0)
MCV: 81.1 fL (ref 78.0–100.0)
PLATELETS: 154 10*3/uL (ref 150–400)
RBC: 3.71 MIL/uL — AB (ref 3.87–5.11)
RDW: 15.5 % (ref 11.5–15.5)
WBC: 8.2 10*3/uL (ref 4.0–10.5)

## 2016-01-24 MED ORDER — VANCOMYCIN HCL IN DEXTROSE 1-5 GM/200ML-% IV SOLN
1000.0000 mg | Freq: Two times a day (BID) | INTRAVENOUS | Status: DC
  Administered 2016-01-24 – 2016-01-25 (×3): 1000 mg via INTRAVENOUS
  Filled 2016-01-24 (×4): qty 200

## 2016-01-24 MED FILL — Sodium Chloride Irrigation Soln 0.9%: Qty: 3000 | Status: AC

## 2016-01-24 MED FILL — Thrombin For Soln 20000 Unit: CUTANEOUS | Qty: 1 | Status: AC

## 2016-01-24 MED FILL — Sodium Chloride IV Soln 0.9%: INTRAVENOUS | Qty: 1000 | Status: AC

## 2016-01-24 MED FILL — Heparin Sodium (Porcine) Inj 1000 Unit/ML: INTRAMUSCULAR | Qty: 30 | Status: AC

## 2016-01-24 NOTE — Care Management Note (Signed)
Case Management Note  Patient Details  Name: [name] MRN: [id number] Date of Birth: 1945-12-12  Subjective/Objective:  S/p lumbar interbody fusion, received her brace today.  Patient states her son will be staying with her after discharge.  She has medication coverage and a PCP, Velna Hatchetobin Sanders.  She will have transportation home.  NCM offered choice , [place] and [facility],  And also for 3 n 1 and rolling walker. [facility]  NCM will cont to follow for dc needs.                   Action/Plan:   Expected Discharge Date:                  Expected Discharge Plan:  Home w Home Health Services  In-House Referral:     Discharge planning Services  CM Consult  Post Acute Care Choice:  Home Health Choice offered to:  Patient  DME Arranged:  3-N-1, Walker rolling DME Agency:  [facility]  [facility]  [facility]  [facility]  Status of Service:  In process, will continue to follow  If discussed at Long Length of Stay Meetings, dates discussed:    Additional Comments:  Leone Havenaylor, Lesleigh Hughson Clinton, RN 01/24/2016, 3:54 PM

## 2016-01-24 NOTE — Evaluation (Signed)
Physical Therapy Evaluation Patient Details Name: [name] MRN: [id number] DOB: Nov 05, 1945 Today's Date: 01/24/2016   History of Present Illness  70 [facility].o. [facility] for TLIF wiht pedicale screw fixation at 2 levels, hardware removal and extension of hardware L1-L5.  Pt with significant PMHx of previous lumbar fusion, ACDF, bil TKA, HTN, and diastolic dysfunction.  Clinical Impression  Pt is mobilizing well with the support of RW.  She reports she already feels like her right leg is stronger than before surgery.  She will have the support of her son at home.   PT to follow acutely for deficits listed below.       Follow Up Recommendations No PT follow up;Supervision for mobility/OOB    Equipment Recommendations  Rolling walker with 5" wheels    Recommendations for Other Services   NA    Precautions / Restrictions Precautions Precautions: Back Precaution Booklet Issued: Yes (comment) Precaution Comments: Reviewed back precautions with pt Required Braces or Orthoses: Spinal Brace Spinal Brace: Lumbar corset;Applied in sitting position Restrictions Weight Bearing Restrictions: No      Mobility  Bed Mobility               General bed mobility comments: Pt OOB in chair  Transfers Overall transfer level: Needs assistance Equipment used: Rolling walker (2 wheeled) Transfers: Sit to/from Stand Sit to Stand: Supervision         General transfer comment: supervision for safety  Ambulation/Gait Ambulation/Gait assistance: Min assist Ambulation Distance (Feet): 130 Feet Assistive device: 4-wheeled walker Gait Pattern/deviations: Step-through pattern;Shuffle;Trunk flexed Gait velocity: decreased   General Gait Details: Verbal cues for upright posture during gait and posture in general while she is healing.  Verbal cues for safe rollator use (use of breaks)        Balance Overall balance assessment: Needs assistance Sitting-balance  support: Feet supported;No upper extremity supported Sitting balance-Leahy Scale: Good     Standing balance support: Single extremity supported;Bilateral upper extremity supported Standing balance-Leahy Scale: Fair                               Pertinent Vitals/Pain Pain Assessment: 0-10 Pain Score: 7  Faces Pain Scale: Hurts even more Pain Location: low back Pain Descriptors / Indicators: Aching;Grimacing;Guarding Pain Intervention(s): Limited activity within patient's tolerance;Monitored during session;Repositioned    Home Living Family/patient expects to be discharged to:: Private residence Living Arrangements: Alone Available Help at Discharge: Family;Available 24 hours/day (son coming to stay with her) Type of Home: House Home Access: Stairs to enter Entrance Stairs-Rails: None Entrance Stairs-Number of Steps: 1 Home Layout: One level Home Equipment: None      Prior Function Level of Independence: Independent                  Extremity/Trunk Assessment   Upper Extremity Assessment: Defer to OT evaluation           Lower Extremity Assessment: RLE deficits/detail;LLE deficits/detail RLE Deficits / Details: per pt report right leg is normally her weaker and tingling legs, functionally right and left look equal strength wise and pt self reporting that the right leg feel stronger.  No numbness. LLE Deficits / Details: per pt report right leg is normally her weaker and tingling legs, functionally right and left look equal strength wise and pt self reporting that the right leg feel stronger.  No numbness.  Cervical / Trunk Assessment: Other exceptions  Communication   Communication: No difficulties  Cognition Arousal/Alertness: Awake/alert Behavior During Therapy: WFL for tasks assessed/performed Overall Cognitive Status: Within Functional Limits for tasks assessed                             Assessment/Plan    PT Assessment Patient  needs continued PT services  PT Problem List Decreased strength;Decreased activity tolerance;Decreased balance;Decreased mobility;Decreased knowledge of use of DME;Pain;Obesity          PT Treatment Interventions DME instruction;Gait training;Functional mobility training;Therapeutic activities;Therapeutic exercise;Balance training;Patient/family education    PT Goals (Current goals can be found in the Care Plan section)  Acute Rehab PT Goals Patient Stated Goal: return home PT Goal Formulation: With patient Time For Goal Achievement: 01/31/16 Potential to Achieve Goals: Good    Frequency Min 5X/week           End of Session Equipment Utilized During Treatment: Back brace Activity Tolerance: Patient limited by pain Patient left: Other (comment) (with OT in the bathroom) Nurse Communication: Mobility status         Time: 1610-9604 PT Time Calculation (min) (ACUTE ONLY): 15 min   Charges:   PT Evaluation $PT Eval Moderate Complexity: 1 Procedure          Serra Younan B. Deroy Noah, PT, DPT [phone]   01/24/2016, 4:50 PM

## 2016-01-24 NOTE — Progress Notes (Signed)
Pharmacy Antibiotic Note  [name] is a 70 y.o. female admitted on 01/23/2016 with back pain.  Pharmacy has been consulted for Vancomycin dosing for surgical prophylaxis. WBC 3.4. Sr 1.14. Hemovac drain in right lower lumbar. Vancomycin will continue until drain no longer in place. Vanco 1500 mg x 1 earlier today.   Plan: -Vancomycin 1000 mg IV q12h  -Continue while drain in place -F/U drain removal for vancomycin discontinuation  -Check trough at steady state if therapy still ongoing  Height: 5\' 5"  (165.1 cm) Weight: 266 lb 1.5 oz (120.7 kg) IBW/kg (Calculated) : 57  Temp (24hrs), Avg:97.8 F (36.6 C), Min:97.2 F (36.2 C), Max:98.2 F (36.8 C)   Recent Labs Lab 01/19/16 0849  WBC 3.4*  CREATININE 1.14*    Estimated Creatinine Clearance: 60.7 mL/min (by C-G formula based on SCr of 1.14 mg/dL (H)).    Allergies  Allergen Reactions  . Lisinopril Swelling    Swelling in mouth and throat  . Penicillins Rash    Has patient had a PCN reaction causing immediate rash, facial/tongue/throat swelling, SOB or lightheadedness with hypotension: No Has patient had a PCN reaction causing severe rash involving mucus membranes or skin necrosis: No Has patient had a PCN reaction that required hospitalization No Has patient had a PCN reaction occurring within the last 10 years: No If all of the above answers are "NO", then may proceed with Cephalosporin use.   . Tramadol Hcl Rash    [name], [name] 01/24/2016 12:16 AM

## 2016-01-24 NOTE — Evaluation (Signed)
Occupational Therapy Evaluation Patient Details Name: [name] MRN: [id number] DOB: April 18, 1946 Today's Date: 01/24/2016    History of Present Illness 70 y.o. female [facility] for TLIF wiht pedicale screw fixation at 2 levels, hardware removal and extension of hardware L1-L5.  Pt with significant PMHx of previous lumbar fusion, ACDF, bil TKA, HTN, and diastolic dysfunction.   Clinical Impression   Pt reports she was independent with ADL PTA. Currently pt overall supervision for safety with ADL and functional mobility. Began back, safety, and ADL education with pt; she verbalized understanding. Pt planning to d/c home with 24/7 supervision from family initially. Pt would benefit from continued skilled OT to address established goals.    Follow Up Recommendations  No OT follow up;Supervision/Assistance - 24 hour    Equipment Recommendations  3 in 1 bedside comode    Recommendations for Other Services       Precautions / Restrictions Precautions Precautions: Back Precaution Booklet Issued: No (Already given by PT) Precaution Comments: Reviewed back precautions with pt Required Braces or Orthoses: Spinal Brace Spinal Brace: Lumbar corset;Applied in sitting position Restrictions Weight Bearing Restrictions: No      Mobility Bed Mobility               General bed mobility comments: Pt OOB upon arrival, returned to chair at end of session.  Transfers Overall transfer level: Needs assistance Equipment used: Rolling walker (2 wheeled) Transfers: Sit to/from Stand Sit to Stand: Supervision         General transfer comment: Supervision for safety; no physical assist needed. VCs for hand placement and technique.    Balance Overall balance assessment: Needs assistance Sitting-balance support: Feet supported;No upper extremity supported Sitting balance-Leahy Scale: Good     Standing balance support: Bilateral upper extremity  supported Standing balance-Leahy Scale: Fair                              ADL Overall ADL's : Needs assistance/impaired Eating/Feeding: Set up;Sitting   Grooming: Min guard;Standing   Upper Body Bathing: Set up;Supervision/ safety;Sitting   Lower Body Bathing: Min guard;Sit to/from stand   Upper Body Dressing : Set up;Supervision/safety;Sitting   Lower Body Dressing: Min guard;Sit to/from stand   Toilet Transfer: Supervision/safety;Ambulation;Regular Toilet;RW           Functional mobility during ADLs: Supervision/safety;Rolling walker General ADL Comments: Educated pt on maintaining back precautions during functional activities and toilet transfer technique. Discussed use of 3 in 1 for over toilet and in tub as a seat for safety.     Vision Vision Assessment?: No apparent visual deficits   Perception     Praxis      Pertinent Vitals/Pain Pain Assessment: Faces Pain Score: 7  Faces Pain Scale: Hurts even more Pain Location: back Pain Descriptors / Indicators: Aching;Sore Pain Intervention(s): Monitored during session;Repositioned     Hand Dominance     Extremity/Trunk Assessment Upper Extremity Assessment Upper Extremity Assessment: Overall WFL for tasks assessed   Lower Extremity Assessment Lower Extremity Assessment: Defer to PT evaluation RLE Deficits / Details: per pt report right leg is normally her weaker and tingling legs, functionally right and left look equal strength wise and pt self reporting that the right leg feel stronger.  No numbness. RLE Sensation:  (WFL to LT.)   Cervical / Trunk Assessment Cervical / Trunk Assessment: Other exceptions Cervical / Trunk Exceptions: h/o both lumbar and cervical  spine surgery.    Communication Communication Communication: No difficulties   Cognition Arousal/Alertness: Awake/alert Behavior During Therapy: WFL for tasks assessed/performed Overall Cognitive Status: Within Functional Limits for  tasks assessed                     General Comments       Exercises       Shoulder Instructions      Home Living Family/patient expects to be discharged to:: Private residence Living Arrangements: Alone Available Help at Discharge: Family;Available 24 hours/day (son coming to stay) Type of Home: House Home Access: Stairs to enter [employer] of Steps: 1 Entrance Stairs-Rails: None Home Layout: One level     Bathroom Shower/Tub: Tub/shower unit Shower/tub characteristics: Engineer, building services: Standard     Home Equipment: None          Prior Functioning/Environment Level of Independence: Independent                 OT Problem List: Impaired balance (sitting and/or standing);Decreased knowledge of use of DME or AE;Decreased knowledge of precautions;Obesity;Pain   OT Treatment/Interventions: Self-care/ADL training;Energy conservation;DME and/or AE instruction;Therapeutic activities;Balance training;Patient/family education    OT Goals(Current goals can be found in the care plan section) Acute Rehab OT Goals Patient Stated Goal: return home OT Goal Formulation: With patient Time For Goal Achievement: 02/07/16 Potential to Achieve Goals: Good ADL Goals Pt Will Perform Tub/Shower Transfer: Tub transfer;with supervision;ambulating;3 in 1;rolling walker Additional ADL Goal #1: Pt will independently verbally recall 3/3 back precautions and maintain throughout ADL. Additional ADL Goal #2: Pt will don/doff back brace with set up as precursor to ADL and functional mobility. Additional ADL Goal #3: Pt will perform bed mobility at a supervision level with HOB flat and no use of bed rails.  OT Frequency: Min 2X/week   Barriers to D/C:            Co-evaluation              End of Session Equipment Utilized During Treatment: Back brace;Rolling walker  Activity Tolerance: Patient tolerated treatment well Patient left: in chair;with call  bell/phone within reach   Time: 1208-1219 OT Time Calculation (min): 11 min Charges:  OT General Charges $OT Visit: 1 Procedure OT Evaluation $OT Eval Moderate Complexity: 1 Procedure G-Codes:     Gaye Alken M.S., OTR/L Pager: [phone]  01/24/2016, 2:39 PM

## 2016-01-24 NOTE — Progress Notes (Signed)
Orthopedic Tech Progress Note Patient Details:  Nicky Pughallis L Bibby December 27, 1945 [id number] Brace order completed by bio-tech vendor. Patient ID: Nicky Pughallis L Fecher, female   DOB: December 27, 1945, 70 y.o.   MRN: [id number]   Jennye MoccasinHughes, Roselynn Whitacre Craig 01/24/2016, 3:08 PM

## 2016-01-24 NOTE — Progress Notes (Signed)
OT Cancellation Note  Patient Details Name: [name] MRN: [id number] DOB: December 14, 1945   Cancelled Treatment:    Reason Eval/Treat Not Completed: Other (comment) (no brace in room). Pt currently with order for back brace and no brace present. Will follow up for OT eval as time allows.    Gaye AlkenBailey A Kellie Murrill M.S., OTR/L Pager: [phone]  01/24/2016, 11:09 AM

## 2016-01-24 NOTE — Progress Notes (Addendum)
Subjective: Patient doing very well this morning.  preop right leg pain much better.     Objective: Vital signs in last 24 hours: Temp:  [97.2 F (36.2 C)-98.5 F (36.9 C)] 98.3 F (36.8 C) (09/27 0812) Pulse Rate:  [49-88] 76 (09/27 0812) Resp:  [7-23] 15 (09/27 0812) BP: (106-148)/(68-96) 109/68 (09/27 0812) SpO2:  [93 %-100 %] 100 % (09/27 0812) Weight:  [120.7 kg (266 lb 1.5 oz)] 120.7 kg (266 lb 1.5 oz) (09/26 2346)  Intake/Output from previous day: 09/26 0701 - 09/27 0700 In: 4278 [I.V.:3825; Blood:100; IV Piggyback:250] Out: 1785 [Urine:1255; Drains:80; Blood:450] Intake/Output this shift: Total I/O In: -  Out: 900 [Urine:900]   Recent Labs  01/24/16 0449  HGB 9.5*    Recent Labs  01/24/16 0449  WBC 8.2  RBC 3.71*  HCT 30.1*  PLT 154    Recent Labs  01/24/16 0449  NA 135  K 4.0  CL 105  CO2 21*  BUN 11  CREATININE 0.94  GLUCOSE 178*  CALCIUM 8.2*   No results for input(s): LABPT, INR in the last 72 hours.  Exam;  Very pleasant BF, alert and oriented x 3 in NAD.  bilat calves nontender.  NVI.   Assessment/Plan: Doing extremely well this morning after long surgical procedure yesterday.  Will start PT.  Brace must be on when up and ambulating.    OWENS,[name] M 01/24/2016, 10:22 AM

## 2016-01-24 NOTE — Progress Notes (Signed)
Orthopedic Tech Progress Note Patient Details:  Nicky Pughallis L Bowditch 1945-05-24 [id number]  Patient ID: Nicky PughPallis L Walder, female   DOB: 1945-05-24, 70 y.o.   MRN: [id number]   Nikki DomCrawford, Jedi Catalfamo 01/24/2016, 9:37 AM Called in bio-tech brace order; [facility]

## 2016-01-25 LAB — BASIC METABOLIC PANEL
ANION GAP: 6 (ref 5–15)
BUN: 14 mg/dL (ref 6–20)
CALCIUM: 8.6 mg/dL — AB (ref 8.9–10.3)
CHLORIDE: 100 mmol/L — AB (ref 101–111)
CO2: 27 mmol/L (ref 22–32)
CREATININE: 1.27 mg/dL — AB (ref 0.44–1.00)
GFR calc non Af Amer: 42 mL/min — ABNORMAL LOW (ref >60)
GFR, EST AFRICAN AMERICAN: 49 mL/min — AB (ref >60)
Glucose, Bld: 127 mg/dL — ABNORMAL HIGH (ref 65–99)
Potassium: 3.9 mmol/L (ref 3.5–5.1)
SODIUM: 133 mmol/L — AB (ref 135–145)

## 2016-01-25 LAB — CBC WITH DIFFERENTIAL/PLATELET
BASOS ABS: 0 10*3/uL (ref 0.0–0.1)
BASOS PCT: 0 %
EOS ABS: 0.1 10*3/uL (ref 0.0–0.7)
Eosinophils Relative: 1 %
HCT: 28.6 % — ABNORMAL LOW (ref 36.0–46.0)
HEMOGLOBIN: 9 g/dL — AB (ref 12.0–15.0)
Lymphocytes Relative: 23 %
Lymphs Abs: 1.8 10*3/uL (ref 0.7–4.0)
MCH: 25.9 pg — ABNORMAL LOW (ref 26.0–34.0)
MCHC: 31.5 g/dL (ref 30.0–36.0)
MCV: 82.2 fL (ref 78.0–100.0)
MONOS PCT: 11 %
Monocytes Absolute: 0.8 10*3/uL (ref 0.1–1.0)
NEUTROS PCT: 65 %
Neutro Abs: 5.3 10*3/uL (ref 1.7–7.7)
Platelets: 166 10*3/uL (ref 150–400)
RBC: 3.48 MIL/uL — ABNORMAL LOW (ref 3.87–5.11)
RDW: 16 % — AB (ref 11.5–15.5)
WBC: 8 10*3/uL (ref 4.0–10.5)

## 2016-01-25 NOTE — Progress Notes (Signed)
PT Cancellation Note  Patient Details Name: [name] MRN: [id number] DOB: [birth year]   Cancelled Treatment:    Reason Eval/Treat Not Completed: Fatigue/lethargy limiting ability to participate.  Pt just got back in bed after being up most of the morning.  She is agreeable for PT to check back later for a walk after she gets some rest and her pain medication kicks in (RN just gave while PT in room).  Thanks,    Rollene Rotundaebecca B. Myron Lona, PT, DPT [phone]   01/25/2016, 3:04 PM

## 2016-01-25 NOTE — Progress Notes (Signed)
Occupational Therapy Treatment Patient Details Name: CITLALI GAUTNEY MRN: [id number] DOB: 1945/06/17 Today's Date: 01/25/2016    History of present illness 70 y.o. female admitted to [facility] on 01/23/16 for TLIF wiht pedicale screw fixation at 2 levels, hardware removal and extension of hardware L1-L5.  Pt with significant PMHx of previous lumbar fusion, ACDF, bil TKA, HTN, and diastolic dysfunction.   OT comments  Pt educated in back precautions related to ADL and bed mobility and multiple uses of 3 in 1. Recommended pt purchase reacher and long handled back sponge. Pt stating at one point that she wanted to go to rehab and then later stated she had plenty of assist at home upon d/c. Will continue to follow.  Follow Up Recommendations  No OT follow up;Supervision/Assistance - 24 hour    Equipment Recommendations  3 in 1 bedside comode    Recommendations for Other Services      Precautions / Restrictions Precautions Precautions: Back Precaution Comments: pt able to state 2/3 Required Braces or Orthoses: Spinal Brace Spinal Brace: Lumbar corset;Applied in sitting position       Mobility Bed Mobility Overal bed mobility: Needs Assistance Bed Mobility: Rolling;Sidelying to Sit Rolling: Supervision Sidelying to sit: Supervision       General bed mobility comments: cues for log roll technique, heavy reliance on rail  Transfers Overall transfer level: Needs assistance Equipment used: Rolling walker (2 wheeled) Transfers: Sit to/from Stand Sit to Stand: Supervision         General transfer comment: supervision for safety    Balance     Sitting balance-Leahy Scale: Good       Standing balance-Leahy Scale: Fair                     ADL Overall ADL's : Needs assistance/impaired     Grooming: Standing;Supervision/safety       Lower Body Bathing: Moderate assistance;Sit to/from stand Lower Body Bathing Details (indicate cue type and reason): recommended long  handled bath sponge Upper Body Dressing : Moderate assistance;Sitting Upper Body Dressing Details (indicate cue type and reason): back brace Lower Body Dressing: Moderate assistance;Sit to/from stand Lower Body Dressing Details (indicate cue type and reason): recommended reacher, pt does not plan to wear socks and will use slip on shoes  Toilet Transfer: Supervision/safety;RW;Ambulation;BSC   Toileting- Clothing Manipulation and Hygiene: Supervision/safety;Sit to/from Nurse, children's Details (indicate cue type and reason): instructed to use 3 in 1 as shower seat, has a grab bar, son can adjust height of 3 in 1 Functional mobility during ADLs: Supervision/safety;Rolling walker General ADL Comments: Pt is aware of multiple uses of 3 in 1.      Vision                     Perception     Praxis      Cognition   Behavior During Therapy: WFL for tasks assessed/performed Overall Cognitive Status: Within Functional Limits for tasks assessed                       Extremity/Trunk Assessment               Exercises     Shoulder Instructions       General Comments      Pertinent Vitals/ Pain       Pain Assessment: Faces Faces Pain Scale: Hurts even more Pain Location: back Pain Descriptors / Indicators: Sore  Pain Intervention(s): Monitored during session;Premedicated before session;Repositioned  Home Living                                          Prior Functioning/Environment              Frequency  Min 2X/week        Progress Toward Goals  OT Goals(current goals can now be found in the care plan section)  Progress towards OT goals: Progressing toward goals  Acute Rehab OT Goals Patient Stated Goal: return home Time For Goal Achievement: 02/07/16 Potential to Achieve Goals: Good  Plan Discharge plan remains appropriate    Co-evaluation                 End of Session Equipment Utilized During  Treatment: Back brace;Rolling walker   Activity Tolerance Patient tolerated treatment well   Patient Left in chair;with call bell/phone within reach   Nurse Communication  (need for brace when up)        Time: 1140-1207 OT Time Calculation (min): 27 min  Charges: OT General Charges $OT Visit: 1 Procedure OT Treatments $Self Care/Home Management : 23-37 mins  Evern BioMayberry, Aston Lawhorn Lynn 01/25/2016, 12:27 PM  [phone]

## 2016-01-25 NOTE — Progress Notes (Signed)
Physical Therapy Treatment Patient Details Name: [name] L Lemelin MRN: [id number] DOB: [birth year] Today's Date: 01/25/2016    History of Present Illness 70 y.o. female [facility] for TLIF wiht pedicale screw fixation at 2 levels, hardware removal and extension of hardware L1-L5.  Pt with significant PMHx of previous lumbar fusion, ACDF, bil TKA, HTN, and diastolic dysfunction.    PT Comments    Found out today that pt does not actually have 24/7 care from her son.  Her son will be there in the morning, friends and neighbors will be there in the afternoon (which she is questioning if they would provide physical help), and no one will be there at night.  She is requiring increased physical assistance today due to increased pain in general and increased right leg pain and functional weakness.  She is fearful of falling if no one is there to physically help her at home.  She would, knowing this new information, and having needed more physical assistance today compared to yesterday, benefit from SW referral to see if she could qualify for SNF placement for rehab at discharge.  [facility] on her knees after surgery and states that would be her preference of places to go.  PT will continue to follow acutely.   Follow Up Recommendations  SNF;Supervision for mobility/OOB     Equipment Recommendations  Rolling walker with 5" wheels    Recommendations for Other Services   NA     Precautions / Restrictions Precautions Precautions: Back Required Braces or Orthoses: Spinal Brace Spinal Brace: Lumbar corset;Applied in sitting position    Mobility  Bed Mobility               General bed mobility comments: Pt OOB in chair  Transfers Overall transfer level: Needs assistance Equipment used: 4-wheeled walker Transfers: Sit to/from Stand Sit to Stand: Min assist         General transfer comment: Min assist to power up to stand from lower  recliner chair, reinforcement of cues for safe hand placement.   Ambulation/Gait Ambulation/Gait assistance: Min assist Ambulation Distance (Feet): 130 Feet Assistive device: 4-wheeled walker Gait Pattern/deviations: Step-through pattern;Decreased weight shift to right;Antalgic Gait velocity: unsafe   General Gait Details: Pt with increased antalgic gait pattern today.  Verbal cues for safe RW use, upright posture during gait.  By the end of the walk pt's gait speed was too fast to be safe (she was trying to hurry to get back to chair to rest).           Balance Overall balance assessment: Needs assistance Sitting-balance support: Bilateral upper extremity supported;Feet supported;No upper extremity supported Sitting balance-Leahy Scale: Good     Standing balance support: Bilateral upper extremity supported Standing balance-Leahy Scale: Poor                      Cognition Arousal/Alertness: Awake/alert Behavior During Therapy: Anxious Overall Cognitive Status: Within Functional Limits for tasks assessed                             Pertinent Vitals/Pain Pain Assessment: 0-10 Pain Score: 7  Pain Location: low back right hip Pain Descriptors / Indicators: Aching;Burning Pain Intervention(s): Limited activity within patient's tolerance;Monitored during session;Repositioned    Home Living     Available Help at Discharge: Family;Friend(s);Available PRN/intermittently  Additional Comments: no one will be there at night, her son will leave sometime in the afternoon and her neighbors/friends will be there.  She is afraid her neighbors and friends will not provide any physical assistance.      Prior Function            PT Goals (current goals can now be found in the care plan section) Acute Rehab PT Goals Patient Stated Goal: to not fall Progress towards PT goals: Not progressing toward goals - comment (more painful and weak)     Frequency    Min 5X/week      PT Plan Discharge plan needs to be updated       End of Session Equipment Utilized During Treatment: Back brace;Gait belt Activity Tolerance: Patient limited by pain Patient left: in chair;with call bell/phone within reach     Time: 1610-9604 PT Time Calculation (min) (ACUTE ONLY): 20 min  Charges:  $Gait Training: 8-22 mins                      Sheretta Grumbine B. Jevaun Strick, PT, DPT [phone]   01/25/2016, 4:40 PM

## 2016-01-25 NOTE — Progress Notes (Signed)
Pharmacy Antibiotic Note  [name] is a 70 y.o. female admitted on 01/23/2016 with back pain.  Pharmacy has been consulted for Vancomycin dosing for surgical prophylaxis with hemovac drain in right lower lumbar. Per RN the drain was removed on 9/27. -Spoke to Dr. Otelia SergeantNitka: ok to discontinue vancomycin  Plan: -Discontinue vancomycin -Will sign off. Please contact pharmacy with any other needs.  Thank you   Height: 5\' 5"  (165.1 cm) Weight: 266 lb 1.5 oz (120.7 kg) IBW/kg (Calculated) : 57  Temp (24hrs), Avg:98.4 F (36.9 C), Min:97.7 F (36.5 C), Max:99 F (37.2 C)   Recent Labs Lab 01/19/16 0849 01/24/16 0449  WBC 3.4* 8.2  CREATININE 1.14* 0.94    Estimated Creatinine Clearance: 73.6 mL/min (by C-G formula based on SCr of 0.94 mg/dL).    Allergies  Allergen Reactions  . Lisinopril Swelling    Swelling in mouth and throat  . Penicillins Rash    Has patient had a PCN reaction causing immediate rash, facial/tongue/throat swelling, SOB or lightheadedness with hypotension: No Has patient had a PCN reaction causing severe rash involving mucus membranes or skin necrosis: No Has patient had a PCN reaction that required hospitalization No Has patient had a PCN reaction occurring within the last 10 years: No If all of the above answers are "NO", then may proceed with Cephalosporin use.   . Tramadol Hcl Rash    Harland Germanndrew Ashlynd Michna, Pharm D 01/25/2016 10:42 AM

## 2016-01-25 NOTE — Progress Notes (Signed)
     Subjective: 2 Days Post-Op Procedure(s) (LRB): TRANSFORAMINAL LUMBAR INTERBODY FUSION (TLIF) WITH PEDICLE SCREW FIXATION 2 LEVEL WITH HARDWARE REMOVAL AND EXTENSION OF HARDWARE. (N/A)PT and OT today, complains of feeling tired. VSS. Voiding without dysuria.  Patient reports pain as moderate.    Objective:   VITALS:  Temp:  [98.3 F (36.8 C)-102.2 F (39 C)] 102.2 F (39 C) (09/28 1953) Pulse Rate:  [76-120] 95 (09/28 1953) Resp:  [16-25] 22 (09/28 1953) BP: (103-134)/(52-71) 130/71 (09/28 1953) SpO2:  [97 %-99 %] 97 % (09/28 1953)  Neurologically intact ABD soft Neurovascular intact Sensation intact distally Intact pulses distally Dorsiflexion/Plantar flexion intact Incision: no drainage   LABS  Recent Labs  01/24/16 0449  HGB 9.5*  WBC 8.2  PLT 154    Recent Labs  01/24/16 0449  NA 135  K 4.0  CL 105  CO2 21*  BUN 11  CREATININE 0.94  GLUCOSE 178*   No results for input(s): LABPT, INR in the last 72 hours.   Assessment/Plan: 2 Days Post-Op Procedure(s) (LRB): TRANSFORAMINAL LUMBAR INTERBODY FUSION (TLIF) WITH PEDICLE SCREW FIXATION 2 LEVEL WITH HARDWARE REMOVAL AND EXTENSION OF HARDWARE. (N/A)  Advance diet Up with therapy  Vancomycin discontinued post 24 hour coverage. Spiked temperature to 102.2 tonight. Advised for pulmonary toilet and check Blood work, send blood cultures and check laboratory. No BM as yet, positive flatus though.  NITKA,[name] E 01/25/2016, 8:06 PM

## 2016-01-25 NOTE — Progress Notes (Addendum)
Subjective: Doing well.  Pain controlled.    Objective: Vital signs in last 24 hours: Temp:  [97.7 F (36.5 C)-99.5 F (37.5 C)] 99.5 F (37.5 C) (09/28 1125) Pulse Rate:  [72-120] 76 (09/28 1125) Resp:  [16-25] 16 (09/28 1125) BP: (93-134)/(52-70) 116/68 (09/28 1125) SpO2:  [97 %-100 %] 98 % (09/28 1125)  Intake/Output from previous day: 09/27 0701 - 09/28 0700 In: 1491.5 [I.V.:891.5; IV Piggyback:600] Out: 3040 [Urine:2950; Drains:90] Intake/Output this shift: Total I/O In: 136.7 [I.V.:136.7] Out: -    Recent Labs  01/24/16 0449  HGB 9.5*    Recent Labs  01/24/16 0449  WBC 8.2  RBC 3.71*  HCT 30.1*  PLT 154    Recent Labs  01/24/16 0449  NA 135  K 4.0  CL 105  CO2 21*  BUN 11  CREATININE 0.94  GLUCOSE 178*  CALCIUM 8.2*   No results for input(s): LABPT, INR in the last 72 hours.  Exam:  Pleasant female, alert and oriented in NAD.  Wound looks good.  No drainage or signs of infection.  bilat calves nontender, NVI.   Assessment/Plan: Per Dr Otelia SergeantNitka ok to transfer to 5N.  Social work consult.  May need short SNF placement for rehab.     OWENS,[name] M 01/25/2016, 11:47 AM

## 2016-01-26 ENCOUNTER — Encounter (HOSPITAL_COMMUNITY): Payer: Self-pay | Admitting: Specialist

## 2016-01-26 LAB — BASIC METABOLIC PANEL
ANION GAP: 5 (ref 5–15)
BUN: 13 mg/dL (ref 6–20)
CHLORIDE: 103 mmol/L (ref 101–111)
CO2: 27 mmol/L (ref 22–32)
Calcium: 8.3 mg/dL — ABNORMAL LOW (ref 8.9–10.3)
Creatinine, Ser: 1.21 mg/dL — ABNORMAL HIGH (ref 0.44–1.00)
GFR calc Af Amer: 52 mL/min — ABNORMAL LOW (ref >60)
GFR, EST NON AFRICAN AMERICAN: 45 mL/min — AB (ref >60)
GLUCOSE: 109 mg/dL — AB (ref 65–99)
POTASSIUM: 3.7 mmol/L (ref 3.5–5.1)
Sodium: 135 mmol/L (ref 135–145)

## 2016-01-26 MED ORDER — POLYETHYLENE GLYCOL 3350 17 G PO PACK
17.0000 g | PACK | Freq: Every day | ORAL | Status: DC
  Administered 2016-01-26 – 2016-01-29 (×4): 17 g via ORAL
  Filled 2016-01-26 (×4): qty 1

## 2016-01-26 MED ORDER — SODIUM CHLORIDE 0.9 % IV BOLUS (SEPSIS)
500.0000 mL | Freq: Once | INTRAVENOUS | Status: AC
  Administered 2016-01-26: 500 mL via INTRAVENOUS

## 2016-01-26 NOTE — Clinical Social Work Note (Signed)
CSW went by patient's room to discuss SNF placement. She was getting a bath at that time. Will come back later.  Denise CourtSarah Vitoria Conyer, CSW [phone]

## 2016-01-26 NOTE — Progress Notes (Signed)
Pt awake to use bathroom and she had 600cc urine, I notice that her hands are now more swollen then they were before she start receiving bolus of fluid, +2. Lungs sound clear will notify AM nurse to inform Doctor.

## 2016-01-26 NOTE — Care Management Important Message (Signed)
Important Message  Patient Details  Name: [name] MRN: [id number] Date of Birth: 1945/12/04   Medicare Important Message Given:  Yes    [name] 01/26/2016, 11:31 AM

## 2016-01-26 NOTE — Care Management Note (Signed)
Case Management Note  Patient Details  Name: [name] MRN: [id number] Date of Birth: 10/05/1945  Subjective/Objective:    S/p lumbar interbody fusion, patient son will be staying with her but will not be there 24 hrs, per pt she is needing more assistance now, SNF referral for CSW.  NCM will cont to follow for dc needs, will notiify [facility] of this information.                 Action/Plan:   Expected Discharge Date:                  Expected Discharge Plan:  Skilled Nursing Facility  In-House Referral:  Clinical Social Work  Discharge planning Services  CM Consult  Post Acute Care Choice:  Home Health Choice offered to:  Patient  DME Arranged:  3-N-1, Walker rolling DME Agency:  [facility]  HH Arranged:  PT, OT HH Agency:  [facility]  Status of Service:  Completed, signed off  If discussed at Long Length of Stay Meetings, dates discussed:    Additional Comments:  Leone Havenaylor, Sinai Mahany Clinton, RN 01/26/2016, 3:06 PM

## 2016-01-26 NOTE — Clinical Social Work Note (Signed)
Clinical Social Work Assessment  Patient Details  Name: [name] MRN: [id number] Date of Birth: 22-Feb-1946  Date of referral:  01/25/16               Reason for consult:  Facility Placement, Discharge Planning                Permission sought to share information with:  Chartered certified accountant granted to share information::  Yes, Verbal Permission Granted  Name::        Agency::  SNF's  Relationship::     Contact Information:     Housing/Transportation Living arrangements for the past 2 months:  Single Family Home Source of Information:  Patient, Medical Team Patient Interpreter Needed:  None Criminal Activity/Legal Involvement Pertinent to Current Situation/Hospitalization:  No - Comment as needed Significant Relationships:  Adult Children Lives with:  Self Do you feel safe going back to the place where you live?  Yes Need for family participation in patient care:  Yes (Comment)  Care giving concerns:  PT recommending SNF once medically stable for discharge.   Social Worker assessment / plan:  CSW met with patient. No supports at bedside. CSW introduced role and explained that PT recommendations would be discussed. Patient agreeable to SNF. [name] is first preference due to the quality care one of patient's family members once received there. CSW left voicemail for admissions coordinator at [facility]. Patient will need PTAR when she discharges. No further concerns. CSW encouraged patient to contact CSW as needed. CSW will continue to follow patient for support and facilitate discharge to SNF once medically stable.  Employment status:  Retired Nurse, adult PT Recommendations:  [place] / Referral to community resources:  [place]  Patient/Family's Response to care:  Patient agreeable to SNF. Patient's son supportive and involved in patient's care. Patient appreciated social work  intervention.  Patient/Family's Understanding of and Emotional Response to Diagnosis, Current Treatment, and Prognosis:  Patient understands the need for rehab prior to returning home. Patient appears happy with hospital care.  Emotional Assessment Appearance:  Appears stated age Attitude/Demeanor/Rapport:  Other (Pleasant) Affect (typically observed):  Accepting, Appropriate, Calm, Pleasant Orientation:  Oriented to Self, Oriented to Place, Oriented to  Time, Oriented to Situation Alcohol / Substance use:  Never Used Psych involvement (Current and /or in the community):  No (Comment)  Discharge Needs  Concerns to be addressed:  Care Coordination Readmission within the last 30 days:  No Current discharge risk:  Dependent with Mobility, Lives alone Barriers to Discharge:  No Barriers Identified   Denise Chroman, LCSW 01/26/2016, 2:14 PM

## 2016-01-26 NOTE — NC FL2 (Signed)
[place] MEDICAID FL2 LEVEL OF CARE SCREENING TOOL     IDENTIFICATION  Patient Name: [name] Birthdate: [birth year] Sex: female Admission Date (Current Location): 01/23/2016  [facility] [place]   Facility and Address:  [facility] [place], [address], [place], [place] [address]      Provider Number: [id number]  Attending Physician Name and Address:  Kerrin ChampagneJames E Kendricks Reap, MD  Relative Name and Phone Number:       Current Level of Care: Hospital Recommended Level of Care: Skilled Nursing Facility Prior Approval Number:    Date Approved/Denied:   PASRR Number: [id number]  Discharge Plan: SNF    Current Diagnoses: Patient Active Problem List   Diagnosis Date Noted  . Spinal stenosis, lumbar region, with neurogenic claudication 01/23/2016    Class: Chronic  . Other secondary scoliosis, 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    Class: Chronic  . Spondylolisthesis of 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  . Spondylolisthesis, 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  . Diastolic dysfunction   . Hypokalemia 03/08/2015  . Bradycardia 03/08/2015  . Nausea and vomiting 03/03/2015  . Syncope and collapse 03/02/2015  . Pancreatic cyst 02/28/2015  . HNP (herniated nucleus pulposus), cervical 07/20/2012    Class: Acute  . Cervical spondylosis without myelopathy 07/20/2012    Class: Chronic  . Hyperlipidemia   . GERD (gastroesophageal reflux disease)   . HYPERLIPIDEMIA 04/16/2010  . Essential hypertension 04/16/2010  . GERD 04/16/2010  . HIATAL HERNIA 04/16/2010  . OSTEOARTHRITIS 04/16/2010  . DYSPHAGIA UNSPECIFIED 04/16/2010    Orientation RESPIRATION BLADDER Height & Weight     Self, Time, Situation, Place  Normal Continent Weight: 266 lb 1.5 oz (120.7 kg) Height:  5\' 5"  (165.1 cm)  BEHAVIORAL SYMPTOMS/MOOD NEUROLOGICAL BOWEL NUTRITION STATUS   (None)  (None) Continent Diet (Regular)  AMBULATORY STATUS COMMUNICATION OF NEEDS Skin   Limited Assist Verbally Surgical  wounds                       Personal Care Assistance Level of Assistance  Bathing, Feeding, Dressing Bathing Assistance: Limited assistance Feeding assistance: Independent Dressing Assistance: Limited assistance     Functional Limitations Info  Sight, Hearing, Speech Sight Info: Adequate Hearing Info: Adequate Speech Info: Adequate    SPECIAL CARE FACTORS FREQUENCY  PT (By licensed PT), Blood pressure, OT (By licensed OT)     PT Frequency: 5 x week OT Frequency: 5 x week            Contractures Contractures Info: Not present    Additional Factors Info  Code Status, Allergies Code Status Info: Full Allergies Info: Lisinopril, Penicillins, Tramadol Hcl           Current Medications (01/26/2016):  This is the current hospital active medication list Current Facility-Administered Medications  Medication Dose Route Frequency Provider Last Rate Last Dose  . 0.9 %  sodium chloride infusion  250 mL Intravenous Continuous Kerrin ChampagneJames E Emrys Mckamie, MD      . 0.9 %  sodium chloride infusion   Intravenous Continuous Kerrin ChampagneJames E Ieesha Abbasi, MD 125 mL/hr at 01/26/16 0052 125 mL/hr at 01/26/16 0052  . acetaminophen (TYLENOL) tablet 650 mg  650 mg Oral Q4H PRN Kerrin ChampagneJames E Whitley Patchen, MD   650 mg at 01/25/16 1958   Or  . acetaminophen (TYLENOL) suppository 650 mg  650 mg Rectal Q4H PRN Kerrin ChampagneJames E Juliett Eastburn, MD      . aspirin EC tablet 81 mg  81 mg Oral Daily Fayrene FearingJames  Lynne Logan, MD   81 mg at 01/26/16 0909  . bisacodyl (DULCOLAX) EC tablet 5 mg  5 mg Oral Daily PRN Kerrin Champagne, MD      . docusate sodium (COLACE) capsule 100 mg  100 mg Oral BID Kerrin Champagne, MD   100 mg at 01/26/16 0909  . gabapentin (NEURONTIN) capsule 600 mg  600 mg Oral TID Kerrin Champagne, MD   600 mg at 01/26/16 0910  . HYDROcodone-acetaminophen (NORCO/VICODIN) 5-325 MG per tablet 1-2 tablet  1-2 tablet Oral Q4H PRN Kerrin Champagne, MD   2 tablet at 01/26/16 1256  . ketorolac (TORADOL) 30 MG/ML injection 30 mg  30 mg Intravenous Once Kerrin Champagne,  MD      . losartan (COZAAR) tablet 100 mg  100 mg Oral Daily Kerrin Champagne, MD   100 mg at 01/26/16 0910  . menthol-cetylpyridinium (CEPACOL) lozenge 3 mg  1 lozenge Oral PRN Kerrin Champagne, MD       Or  . phenol (CHLORASEPTIC) mouth spray 1 spray  1 spray Mouth/Throat PRN Kerrin Champagne, MD      . morphine 2 MG/ML injection 1-4 mg  1-4 mg Intravenous Q3H PRN Kerrin Champagne, MD      . multivitamin with minerals tablet 1 tablet  1 tablet Oral Daily Kerrin Champagne, MD   1 tablet at 01/26/16 0910  . ondansetron (ZOFRAN) injection 4 mg  4 mg Intravenous Q4H PRN Kerrin Champagne, MD      . oxyCODONE-acetaminophen (PERCOCET/ROXICET) 5-325 MG per tablet 1-2 tablet  1-2 tablet Oral Q4H PRN Kerrin Champagne, MD   2 tablet at 01/24/16 0000  . pantoprazole (PROTONIX) EC tablet 80 mg  80 mg Oral Daily Kerrin Champagne, MD   80 mg at 01/26/16 0910  . polyethylene glycol (MIRALAX / GLYCOLAX) packet 17 g  17 g Oral Daily PRN Kerrin Champagne, MD      . simvastatin (ZOCOR) tablet 20 mg  20 mg Oral QPM Kerrin Champagne, MD   20 mg at 01/25/16 1837  . sodium chloride flush (NS) 0.9 % injection 3 mL  3 mL Intravenous Q12H Kerrin Champagne, MD   3 mL at 01/26/16 1000  . sodium chloride flush (NS) 0.9 % injection 3 mL  3 mL Intravenous PRN Kerrin Champagne, MD      . sodium phosphate (FLEET) 7-19 GM/118ML enema 1 enema  1 enema Rectal Once PRN Kerrin Champagne, MD      . tiZANidine (ZANAFLEX) tablet 2-4 mg  2-4 mg Oral BID PRN Kerrin Champagne, MD      . triamterene-hydrochlorothiazide [facility]) 37.5-25 MG per tablet 0.5 tablet  0.5 tablet Oral Daily Kerrin Champagne, MD   0.5 tablet at 01/26/16 0909     Discharge Medications: Please see discharge summary for a list of discharge medications.  Relevant Imaging Results:  Relevant Lab Results:   Additional Information SS#: [id number]  Margarito Liner, LCSW

## 2016-01-26 NOTE — Progress Notes (Signed)
Physical Therapy Treatment Patient Details Name: [name] L Dority MRN: [id number] DOB: 08/07/1945 Today's Date: 01/26/2016    History of Present Illness 70 y.o. female [facility] for TLIF wiht pedicale screw fixation at 2 levels, hardware removal and extension of hardware L1-L5.  Pt with significant PMHx of previous lumbar fusion, ACDF, bil TKA, HTN, and diastolic dysfunction.    PT Comments    Pt continues to have painful gait pattern, requiring min assist for all mobility.  She is afraid to go home and not have 24/7 hands on care and would like to go to SNF reahb (which I agree is appropriate) to make sure she is steady on her feet before she goes home with intermittent supervision.  Pt needs to practice bed mobility and back precautions need to be reviewed/reinforced.    Follow Up Recommendations  SNF;Supervision for mobility/OOB     Equipment Recommendations  Rolling walker with 5" wheels    Recommendations for Other Services  NA     Precautions / Restrictions Precautions Precautions: Back Required Braces or Orthoses: Spinal Brace Spinal Brace: Lumbar corset;Applied in sitting position Restrictions Weight Bearing Restrictions: No    Mobility  Bed Mobility               General bed mobility comments: Pt is seated on BSC when PT entered  Transfers Overall transfer level: Needs assistance Equipment used: Rolling walker (2 wheeled) Transfers: Sit to/from Stand Sit to Stand: Min assist         General transfer comment: Min assist to help support trunk during transitions.  Verbal cues for safe hand placement.  Ambulation/Gait Ambulation/Gait assistance: Min assist Ambulation Distance (Feet): 130 Feet Assistive device: Rolling walker (2 wheeled) Gait Pattern/deviations: Step-through pattern;Antalgic;Trunk flexed Gait velocity: decreased (safer speed with normal RW use vs rollator) Gait velocity interpretation: Below normal speed for  age/gender General Gait Details: Verbal cues for upright posture, safe RW use.  Pt with antalgic gait pattern favoring her left leg.           Balance Overall balance assessment: Needs assistance Sitting-balance support: Feet supported;Bilateral upper extremity supported Sitting balance-Leahy Scale: Good     Standing balance support: Bilateral upper extremity supported Standing balance-Leahy Scale: Poor                      Cognition Arousal/Alertness: Awake/alert Behavior During Therapy: WFL for tasks assessed/performed Overall Cognitive Status: Within Functional Limits for tasks assessed                             Pertinent Vitals/Pain Pain Assessment: 0-10 Pain Score: 8  Pain Location: right hip and leg Pain Descriptors / Indicators: Aching;Burning Pain Intervention(s): Limited activity within patient's tolerance;Monitored during session;Repositioned           PT Goals (current goals can now be found in the care plan section) Acute Rehab PT Goals Patient Stated Goal: to not fall, get stronger so that she can go home and walk without assistance or fear of falling.  Progress towards PT goals: Progressing toward goals    Frequency    Min 5X/week      PT Plan Current plan remains appropriate       End of Session Equipment Utilized During Treatment: Gait belt;Back brace Activity Tolerance: Patient limited by pain Patient left: in chair;with call bell/phone within reach     Time: [id number] PT Time Calculation (min) (  ACUTE ONLY): 17 min  Charges:  $Gait Training: 8-22 mins                      Seanne Chirico B. Shaunita Seney, PT, DPT [phone]   01/26/2016, 10:23 AM

## 2016-01-26 NOTE — Progress Notes (Signed)
Occupational Therapy Treatment Patient Details Name: [name] MRN: [id number] DOB: 08-Apr-1946 Today's Date: 01/26/2016    History of present illness 70 y.o. female [facility] for TLIF wiht pedicale screw fixation at 2 levels, hardware removal and extension of hardware L1-L5.  Pt with significant PMHx of previous lumbar fusion, ACDF, bil TKA, HTN, and diastolic dysfunction.   OT comments  Pt moving slower with more pain today. Requiring assist for sit to stand and for pericare today. Heavily reliant on B UEs in standing. Pt stating now that she does not have adequate assistance at home for the level of care she requires for ADL. Updated d/c disposition to SNF.  Follow Up Recommendations  SNF;Supervision/Assistance - 24 hour    Equipment Recommendations  3 in 1 bedside comode    Recommendations for Other Services      Precautions / Restrictions Precautions Precautions: Back Precaution Comments: able to state 3/3 back precautions Required Braces or Orthoses: Spinal Brace Spinal Brace: Lumbar corset;Applied in sitting position       Mobility Bed Mobility               General bed mobility comments: in chair, instructed pt to stand every 45 min to an hour with assistance  Transfers Overall transfer level: Needs assistance Equipment used: Rolling walker (2 wheeled) Transfers: Sit to/from Stand Sit to Stand: Min assist         General transfer comment: Min assist to help support trunk during transitions.  Verbal cues for safe hand placement.    Balance Overall balance assessment: Needs assistance Sitting-balance support: Feet supported;Bilateral upper extremity supported Sitting balance-Leahy Scale: Good     Standing balance support: Bilateral upper extremity supported Standing balance-Leahy Scale: Poor (not able to release walker for pericare)                     ADL Overall ADL's : Needs assistance/impaired     Grooming:  Wash/dry hands;Wash/dry face;Sitting;Set up   Upper Body Bathing: Minimal assitance;Sitting Upper Body Bathing Details (indicate cue type and reason): assisted with back     Upper Body Dressing : Moderate assistance;Standing;Minimal assistance;Sitting Upper Body Dressing Details (indicate cue type and reason): back brace max, gown min     Toilet Transfer: RW;Ambulation;BSC;Min guard   Toileting- Clothing Manipulation and Hygiene: Maximal assistance;Sit to/from stand       Functional mobility during ADLs: Rolling walker;Min guard General ADL Comments: Pt moving more slowly today than previous visit.      Vision                     Perception     Praxis      Cognition   Behavior During Therapy: WFL for tasks assessed/performed Overall Cognitive Status: Within Functional Limits for tasks assessed                       Extremity/Trunk Assessment               Exercises     Shoulder Instructions       General Comments      Pertinent Vitals/ Pain       Pain Assessment: 0-10 Pain Score: 7  Pain Location: back Pain Descriptors / Indicators: Aching;Burning;Guarding;Grimacing Pain Intervention(s): Monitored during session;Repositioned  Home Living  Prior Functioning/Environment              Frequency  Min 2X/week        Progress Toward Goals  OT Goals(current goals can now be found in the care plan section)  Progress towards OT goals: Not progressing toward goals - comment (increased pain and weakness)  Acute Rehab OT Goals Patient Stated Goal: to not fall, get stronger so that she can go home and walk without assistance or fear of falling.  Time For Goal Achievement: 02/07/16 Potential to Achieve Goals: Good  Plan Discharge plan needs to be updated    Co-evaluation                 End of Session Equipment Utilized During Treatment: Back brace;Rolling walker    Activity Tolerance Patient tolerated treatment well   Patient Left in chair;with call bell/phone within reach   Nurse Communication          Time: 1005-1030 OT Time Calculation (min): 25 min  Charges: OT General Charges $OT Visit: 1 Procedure OT Treatments $Self Care/Home Management : 23-37 mins  Evern Bio 01/26/2016, 2:17 PM  [phone]

## 2016-01-26 NOTE — Progress Notes (Signed)
Lab shows h/h fairly stable and WBC stable. Cr on BMET elevated with lower Na like GI related or 3rd spacing. Will increase IVF and give a NS bolus. Check AM BMET.

## 2016-01-26 NOTE — Clinical Social Work Placement (Signed)
   CLINICAL SOCIAL WORK PLACEMENT  NOTE  Date:  01/26/2016  Patient Details  Name: Nicky Pughallis L Gerald MRN: [id number] Date of Birth: 1945-10-09  Clinical Social Work is seeking post-discharge placement for this patient at the Skilled  Nursing Facility level of care (*CSW will initial, date and re-position this form in  chart as items are completed):  Yes   Patient/family provided with [facility] Clinical Social Work Department's list of facilities offering this level of care within the geographic area requested by the patient (or if unable, by the patient's family).  Yes   Patient/family informed of their freedom to choose among providers that offer the needed level of care, that participate in Medicare, Medicaid or managed care program needed by the patient, have an available bed and are willing to accept the patient.  Yes   Patient/family informed of [facility]'s ownership interest in [facility] and [facility], as well as of the fact that they are under no obligation to receive care at these facilities.  PASRR submitted to EDS on 01/26/16     PASRR number received on       Existing PASRR number confirmed on 01/26/16     FL2 transmitted to all facilities in geographic area requested by pt/family on 01/26/16     FL2 transmitted to all facilities within larger geographic area on       Patient informed that his/her managed care company has contracts with or will negotiate with certain facilities, including the following:            Patient/family informed of bed offers received.  Patient chooses bed at       Physician recommends and patient chooses bed at      Patient to be transferred to   on  .  Patient to be transferred to facility by       Patient family notified on   of transfer.  Name of family member notified:        PHYSICIAN Please sign FL2     Additional Comment:    _______________________________________________ Margarito LinerSarah C Catheryn Slifer, LCSW 01/26/2016,  2:17 PM

## 2016-01-26 NOTE — Progress Notes (Signed)
     Subjective: 3 Days Post-Op Procedure(s) (LRB): TRANSFORAMINAL LUMBAR INTERBODY FUSION (TLIF) WITH PEDICLE SCREW FIXATION 2 LEVEL WITH HARDWARE REMOVAL AND EXTENSION OF HARDWARE. (N/A)Awake, alert and oriented x 4. Still in a stepdown unit bed despite request for transfer yesterday. Nursing reports no bed available for transfer to med-surg floor. Positive flatus. Tolerating pos, oral narcotics and nourishment.  Patient reports pain as moderate.    Objective:   VITALS:  Temp:  [98 F (36.7 C)-100.2 F (37.9 C)] 100.1 F (37.8 C) (09/29 2008) Pulse Rate:  [72-95] 88 (09/29 2008) Resp:  [15-22] 18 (09/29 2008) BP: (95-142)/(57-80) 130/73 (09/29 2008) SpO2:  [92 %-98 %] 97 % (09/29 2008)  Neurologically intact ABD soft Neurovascular intact Sensation intact distally Intact pulses distally Dorsiflexion/Plantar flexion intact Incision: no drainage No cellulitis present   LABS  Recent Labs  01/24/16 0449 01/25/16 2135  HGB 9.5* 9.0*  WBC 8.2 8.0  PLT 154 166    Recent Labs  01/25/16 2135 01/26/16 0233  NA 133* 135  K 3.9 3.7  CL 100* 103  CO2 27 27  BUN 14 13  CREATININE 1.27* 1.21*  GLUCOSE 127* 109*   No results for input(s): LABPT, INR in the last 72 hours.   Assessment/Plan: 3 Days Post-Op Procedure(s) (LRB): TRANSFORAMINAL LUMBAR INTERBODY FUSION (TLIF) WITH PEDICLE SCREW FIXATION 2 LEVEL WITH HARDWARE REMOVAL AND EXTENSION OF HARDWARE. (N/A)  Advance diet Up with therapy Discharge to SNF after transition to Med-surg floor. D/C telemetry  Lashonda Sonneborn E 01/26/2016, 9:06 PM

## 2016-01-27 LAB — BASIC METABOLIC PANEL
ANION GAP: 5 (ref 5–15)
BUN: 14 mg/dL (ref 6–20)
CO2: 26 mmol/L (ref 22–32)
Calcium: 8 mg/dL — ABNORMAL LOW (ref 8.9–10.3)
Chloride: 105 mmol/L (ref 101–111)
Creatinine, Ser: 1.1 mg/dL — ABNORMAL HIGH (ref 0.44–1.00)
GFR calc Af Amer: 58 mL/min — ABNORMAL LOW (ref >60)
GFR, EST NON AFRICAN AMERICAN: 50 mL/min — AB (ref >60)
GLUCOSE: 111 mg/dL — AB (ref 65–99)
POTASSIUM: 3.8 mmol/L (ref 3.5–5.1)
Sodium: 136 mmol/L (ref 135–145)

## 2016-01-27 LAB — CBC WITH DIFFERENTIAL/PLATELET
BASOS ABS: 0 10*3/uL (ref 0.0–0.1)
Basophils Relative: 0 %
Eosinophils Absolute: 0.2 10*3/uL (ref 0.0–0.7)
Eosinophils Relative: 3 %
HEMATOCRIT: 24.8 % — AB (ref 36.0–46.0)
HEMOGLOBIN: 7.8 g/dL — AB (ref 12.0–15.0)
LYMPHS PCT: 27 %
Lymphs Abs: 1.8 10*3/uL (ref 0.7–4.0)
MCH: 25.4 pg — ABNORMAL LOW (ref 26.0–34.0)
MCHC: 31.5 g/dL (ref 30.0–36.0)
MCV: 80.8 fL (ref 78.0–100.0)
MONO ABS: 0.6 10*3/uL (ref 0.1–1.0)
Monocytes Relative: 10 %
NEUTROS ABS: 4.1 10*3/uL (ref 1.7–7.7)
Neutrophils Relative %: 60 %
Platelets: 163 10*3/uL (ref 150–400)
RBC: 3.07 MIL/uL — AB (ref 3.87–5.11)
RDW: 15.8 % — AB (ref 11.5–15.5)
WBC: 6.7 10*3/uL (ref 4.0–10.5)

## 2016-01-27 NOTE — Progress Notes (Signed)
Subjective: 4 Days Post-Op Procedure(s) (LRB): TRANSFORAMINAL LUMBAR INTERBODY FUSION (TLIF) WITH PEDICLE SCREW FIXATION 2 LEVEL WITH HARDWARE REMOVAL AND EXTENSION OF HARDWARE. (N/A) Patient reports pain as moderate.  Finally got to med-surg floor bed this am.  Vitlas stable and denies light-headedness in light of acute blood loss anemia (tolerating well)  Objective: Vital signs in last 24 hours: Temp:  [98.1 F (36.7 C)-100.1 F (37.8 C)] 98.4 F (36.9 C) (09/30 0508) Pulse Rate:  [70-90] 70 (09/30 0508) Resp:  [16-20] 16 (09/30 0129) BP: (93-135)/(49-79) 122/65 (09/30 0928) SpO2:  [96 %-98 %] 98 % (09/30 0508)  Intake/Output from previous day: 09/29 0701 - 09/30 0700 In: 2355.3 [P.O.:240; I.V.:2115.3] Out: 1525 [Urine:1525] Intake/Output this shift: No intake/output data recorded.   Recent Labs  01/25/16 2135 01/27/16 0513  HGB 9.0* 7.8*    Recent Labs  01/25/16 2135 01/27/16 0513  WBC 8.0 6.7  RBC 3.48* 3.07*  HCT 28.6* 24.8*  PLT 166 163    Recent Labs  01/26/16 0233 01/27/16 0513  NA 135 136  K 3.7 3.8  CL 103 105  CO2 27 26  BUN 13 14  CREATININE 1.21* 1.10*  GLUCOSE 109* 111*  CALCIUM 8.3* 8.0*   No results for input(s): LABPT, INR in the last 72 hours.  Sensation intact distally Intact pulses distally Dorsiflexion/Plantar flexion intact Incision: scant drainage  Assessment/Plan: 4 Days Post-Op Procedure(s) (LRB): TRANSFORAMINAL LUMBAR INTERBODY FUSION (TLIF) WITH PEDICLE SCREW FIXATION 2 LEVEL WITH HARDWARE REMOVAL AND EXTENSION OF HARDWARE. (N/A) Up with therapy Discharge to SNF soon Will check H/H again tomorrow.  Kathryne HitchChristopher Y Lujuana Kapler 01/27/2016, 10:55 AM

## 2016-01-27 NOTE — Progress Notes (Signed)
Physical Therapy Treatment Patient Details Name: [name] MRN: [id number] DOB: Jun 24, 1945 Today's Date: 01/27/2016    History of Present Illness 70 y.o. female [facility] for TLIF wiht pedicale screw fixation at 2 levels, hardware removal and extension of hardware L1-L5.  Pt with significant PMHx of previous lumbar fusion, ACDF, bil TKA, HTN, and diastolic dysfunction.    PT Comments    Pt making gradual progress with ambulation, 120 ft with rw and min guard assistance. The patient's cadence remains slow with mild instability but no gross loss of balance. PT recommending SNF for further rehabilitation following acute stay.   Follow Up Recommendations  SNF;Supervision for mobility/OOB     Equipment Recommendations  Rolling walker with 5" wheels    Recommendations for Other Services       Precautions / Restrictions Precautions Precautions: Back Required Braces or Orthoses: Spinal Brace Spinal Brace: Lumbar corset;Applied in sitting position Restrictions Weight Bearing Restrictions: No    Mobility  Bed Mobility               General bed mobility comments: pt up in chair upon arrival, declines returning to chair following.   Transfers Overall transfer level: Needs assistance Equipment used: Rolling walker (2 wheeled) Transfers: Sit to/from Stand Sit to Stand: Min guard         General transfer comment: from chair, cues for neutral spine  Ambulation/Gait Ambulation/Gait assistance: Min guard Ambulation Distance (Feet): 120 Feet Assistive device: Rolling walker (2 wheeled) Gait Pattern/deviations: Step-through pattern;Decreased step length - right;Decreased step length - left Gait velocity: slow cadence   General Gait Details: Cues for posture provided. pt taking small strides, no loss of balance but mild instability noted.    Stairs            Wheelchair Mobility    Modified Rankin (Stroke Patients Only)       Balance  Overall balance assessment: Needs assistance Sitting-balance support: No upper extremity supported Sitting balance-Leahy Scale: Good     Standing balance support: Bilateral upper extremity supported Standing balance-Leahy Scale: Poor Standing balance comment: using rw for support                    Cognition Arousal/Alertness: Awake/alert Behavior During Therapy: WFL for tasks assessed/performed Overall Cognitive Status: Within Functional Limits for tasks assessed                      Exercises      General Comments        Pertinent Vitals/Pain Pain Assessment: Faces Faces Pain Scale: Hurts little more Pain Location: back Pain Descriptors / Indicators: Guarding Pain Intervention(s): Limited activity within patient's tolerance;Monitored during session    Home Living                      Prior Function            PT Goals (current goals can now be found in the care plan section) Acute Rehab PT Goals Patient Stated Goal: eventually get back home and be stronger PT Goal Formulation: With patient Time For Goal Achievement: 01/31/16 Potential to Achieve Goals: Good Progress towards PT goals: Progressing toward goals    Frequency    Min 5X/week      PT Plan Current plan remains appropriate    Co-evaluation             End of Session Equipment Utilized During Treatment: Gait  belt;Back brace Activity Tolerance: Patient tolerated treatment well Patient left: in chair;with call bell/phone within reach     Time: 1610-9604 PT Time Calculation (min) (ACUTE ONLY): 16 min  Charges:  $Gait Training: 8-22 mins                    G Codes:      Christiane Ha, PT, CSCS Pager [phone] Office [phone]  01/27/2016, 2:52 PM

## 2016-01-28 LAB — CBC
HCT: 25.3 % — ABNORMAL LOW (ref 36.0–46.0)
Hemoglobin: 7.9 g/dL — ABNORMAL LOW (ref 12.0–15.0)
MCH: 25.3 pg — AB (ref 26.0–34.0)
MCHC: 31.2 g/dL (ref 30.0–36.0)
MCV: 81.1 fL (ref 78.0–100.0)
Platelets: 202 10*3/uL (ref 150–400)
RBC: 3.12 MIL/uL — ABNORMAL LOW (ref 3.87–5.11)
RDW: 15.5 % (ref 11.5–15.5)
WBC: 5.8 10*3/uL (ref 4.0–10.5)

## 2016-01-28 MED ORDER — WHITE PETROLATUM GEL
Status: AC
  Administered 2016-01-28: 21:00:00
  Filled 2016-01-28: qty 1

## 2016-01-28 NOTE — Progress Notes (Signed)
Patient ID: [name], female   DOB: [birth year], 70 y.o.   MRN: [id number] H/H remains stable and so do her vitals.  Denies light-headedness.  Back dressing clean.  Has been mobilizing with therapy.  Good groos motor in bil LE.  SNF likely tomorrow.

## 2016-01-29 DIAGNOSIS — Z23 Encounter for immunization: Secondary | ICD-10-CM | POA: Diagnosis not present

## 2016-01-29 MED ORDER — METHOCARBAMOL 500 MG PO TABS: 500.0000 mg | ORAL_TABLET | Freq: Four times a day (QID) | ORAL | 0 refills | Status: DC | PRN

## 2016-01-29 MED ORDER — OXYCODONE-ACETAMINOPHEN 5-325 MG PO TABS: 1.0000 | ORAL_TABLET | Freq: Four times a day (QID) | ORAL | 0 refills | Status: DC | PRN

## 2016-01-29 NOTE — Consult Note (Signed)
   [facility]   01/29/2016  [name] 25-Jul-1945 [id number]    [facility] potential [facility]. Chart reviewed. Noted discharge plan is for SNF.  There are no identifiable [facility] needs at this time. If patient's post hospital needs change, please place a [facility] consult. For questions please contact:  Raiford Nobletika Shimeka Bacot, MSN-Ed, RN,BSN [facility] Care [facility] Hospital Liaison [phone]

## 2016-01-29 NOTE — Progress Notes (Signed)
Physical Therapy Treatment Patient Details Name: [name] MRN: [id number] DOB: [birth year] Today's Date: 01/29/2016    History of Present Illness 70 [facility].o. female [facility] for TLIF wiht pedicale screw fixation at 2 levels, hardware removal and extension of hardware L1-L5.  Pt with significant PMHx of previous lumbar fusion, ACDF, bil TKA, HTN, and diastolic dysfunction.    PT Comments    Patient progressing well towards PT goals. Improved ambulation distance with Min guard assist for safety but fatigues. Able to recall back precautions and implement into mobility. Pt not safe to return home without 24/7 supervision due to safety concerns as pt with mild imbalance so continue to recommend SNF. Will continue to follow.   Follow Up Recommendations  SNF;Supervision for mobility/OOB     Equipment Recommendations  Rolling walker with 5" wheels    Recommendations for Other Services       Precautions / Restrictions Precautions Precautions: Back Precaution Booklet Issued: Yes (comment) Precaution Comments: able to state 3/3 back precautions Required Braces or Orthoses: Spinal Brace Spinal Brace: Lumbar corset;Applied in sitting position Restrictions Weight Bearing Restrictions: No    Mobility  Bed Mobility Overal bed mobility: Needs Assistance Bed Mobility: Rolling;Sidelying to Sit Rolling: Supervision Sidelying to sit: Supervision;HOB elevated       General bed mobility comments: Cues for log roll technique. Use of rail.   Transfers Overall transfer level: Needs assistance Equipment used: Rolling walker (2 wheeled);None Transfers: Sit to/from RaytheonStand;Stand Pivot Transfers Sit to Stand: Min guard Stand pivot transfers: Min guard       General transfer comment: Min guard to stand from [facility] x1, [facility]. [facility] guard assist.  Ambulation/Gait Ambulation/Gait assistance: Min guard Ambulation Distance (Feet): 175 Feet Assistive  device: Rolling walker (2 wheeled) Gait Pattern/deviations: Step-through pattern;Decreased stride length;Decreased step length - right;Decreased step length - left Gait velocity: decreased Gait velocity interpretation: Below normal speed for age/gender General Gait Details: Slow, steady gait with cues for upright posture and to increase cadence. No LOB noted.    Stairs            Wheelchair Mobility    Modified Rankin (Stroke Patients Only)       Balance Overall balance assessment: Needs assistance Sitting-balance support: Feet supported;No upper extremity supported Sitting balance-Leahy Scale: Good Sitting balance - Comments: Requires assist donning brace EOB.   Standing balance support: During functional activity Standing balance-Leahy Scale: Poor Standing balance comment: Requires assist with pericare and UEs support in standing.                    Cognition Arousal/Alertness: Awake/alert Behavior During Therapy: WFL for tasks assessed/performed Overall Cognitive Status: Within Functional Limits for tasks assessed                      Exercises      General Comments        Pertinent Vitals/Pain Pain Assessment: No/denies pain    Home Living                      Prior Function            PT Goals (current goals can now be found in the care plan section) Progress towards PT goals: Progressing toward goals    Frequency    Min 5X/week      PT Plan Current plan remains appropriate    Co-evaluation  End of Session Equipment Utilized During Treatment: Gait belt;Back brace Activity Tolerance: Patient tolerated treatment well Patient left: Other (comment);with call bell/phone within reach (on [facility] with call bell in lap.)     Time: 4098-1191 PT Time Calculation (min) (ACUTE ONLY): 23 min  Charges:  $Gait Training: 23-37 mins                    G Codes:      [name] 01/29/2016, 9:25  AM Mylo Red, PT, DPT [phone]

## 2016-01-29 NOTE — Progress Notes (Signed)
SW spoke with heather/admission who states that pt is welcomed to come today. She states [facility].  Crista CurbBrittney Stavros Cail, MSW [phone]

## 2016-01-29 NOTE — Discharge Summary (Signed)
Patient ID: [name] MRN: [id number] DOB/AGE: March 08, 1946 70 y.o.  Admit date: 01/23/2016 Discharge date: 01/29/2016  Admission Diagnoses:  Principal Problem:   Spinal stenosis, lumbar region, with neurogenic claudication Active Problems:   Other secondary scoliosis, lumbar region   Spondylolisthesis of lumbar region   Spondylolisthesis, lumbar region   Discharge Diagnoses:  Principal Problem:   Spinal stenosis, lumbar region, with neurogenic claudication Active Problems:   Other secondary scoliosis, lumbar region   Spondylolisthesis of lumbar region   Spondylolisthesis, lumbar region  status post Procedure(s): TRANSFORAMINAL LUMBAR INTERBODY FUSION (TLIF) WITH PEDICLE SCREW FIXATION 2 LEVEL WITH HARDWARE REMOVAL AND EXTENSION OF HARDWARE.  Past Medical History:  Diagnosis Date  . Arthritis   . Cataract of right eye   . Diastolic dysfunction   . GERD (gastroesophageal reflux disease)   . Hyperlipidemia   . Hypertension   . Pancreatic cyst 02/2015   Recommend follow-up MRI in 12 months.    Surgeries: Procedure(s): TRANSFORAMINAL LUMBAR INTERBODY FUSION (TLIF) WITH PEDICLE SCREW FIXATION 2 LEVEL WITH HARDWARE REMOVAL AND EXTENSION OF HARDWARE. on 01/23/2016   Consultants:   Discharged Condition: Improved  Hospital Course: [name] is an 70 y.o. female who was admitted 01/23/2016 for operative treatment of Spinal stenosis, lumbar region, with neurogenic claudication. Patient failed conservative treatments (please see the history and physical for the specifics) and had severe unremitting pain that affects sleep, daily activities and work/hobbies. After pre-op clearance, the patient was taken to the operating room on 01/23/2016 and underwent  Procedure(s): TRANSFORAMINAL LUMBAR INTERBODY FUSION (TLIF) WITH PEDICLE SCREW FIXATION 2 LEVEL WITH HARDWARE REMOVAL AND EXTENSION OF HARDWARE.[name].    Patient was given perioperative antibiotics: Anti-infectives    Start      Dose/Rate Route Frequency Ordered Stop   01/24/16 0100  vancomycin (VANCOCIN) IVPB 1000 mg/200 mL premix  Status:  Discontinued     1,000 mg 200 mL/hr over 60 Minutes Intravenous Every 12 hours 01/24/16 0034 01/25/16 1043   01/23/16 0630  vancomycin (VANCOCIN) 1,500 mg in sodium chloride 0.9 % 500 mL IVPB     1,500 mg 250 mL/hr over 120 Minutes Intravenous To ShortStay Surgical 01/23/16 0616 01/23/16 0910   01/23/16 0620  vancomycin (VANCOCIN) 1-5 GM/200ML-% IVPB    Comments:  Lorette AngRosenberger, Meredit: cabinet override      01/23/16 0620 01/23/16 1829       Patient was given sequential compression devices and early ambulation to prevent DVT.   Patient benefited maximally from hospital stay and there were no complications. At the time of discharge, the patient was urinating/moving their bowels without difficulty, tolerating a regular diet, pain is controlled with oral pain medications and they have been cleared by PT/OT.   Recent vital signs: Patient Vitals for the past 24 hrs:  BP Temp Temp src Pulse Resp SpO2  01/29/16 0506 (!) 116/48 98.3 F (36.8 C) Oral 70 - 100 %  01/29/16 0020 (!) 108/54 - - - - -  01/29/16 0017 96/72 98.5 F (36.9 C) Oral 74 - 94 %  01/28/16 1934 128/67 99.8 F (37.7 C) Oral 84 - 100 %  01/28/16 1552 122/64 98.9 F (37.2 C) Oral 78 18 96 %     Recent laboratory studies:  Recent Labs  01/27/16 0513 01/28/16 0410  WBC 6.7 5.8  HGB 7.8* 7.9*  HCT 24.8* 25.3*  PLT 163 202  NA 136  --   K 3.8  --   CL 105  --   CO2  26  --   BUN 14  --   CREATININE 1.10*  --   GLUCOSE 111*  --   CALCIUM 8.0*  --      Discharge Medications:     Medication List    STOP taking these medications   cyclobenzaprine 10 MG tablet Commonly known as:  FLEXERIL   HYDROcodone-acetaminophen 7.5-325 MG tablet Commonly known as:  NORCO   multivitamins ther. w/minerals Tabs tablet   tiZANidine 2 MG tablet Commonly known as:  ZANAFLEX     TAKE these medications    aspirin EC 81 MG tablet Take 81 mg by mouth daily.   BIOTIN PO Take 1 capsule by mouth daily.   COZAAR 100 MG tablet Generic drug:  losartan Take 100 mg by mouth daily.   gabapentin 300 MG capsule Commonly known as:  NEURONTIN Take 600 mg by mouth 3 (three) times daily.   methocarbamol 500 MG tablet Commonly known as:  ROBAXIN Take 1 tablet (500 mg total) by mouth every 6 (six) hours as needed for muscle spasms.   omeprazole 40 MG capsule Commonly known as:  PRILOSEC Take 40 mg by mouth 2 (two) times daily.   oxyCODONE-acetaminophen 5-325 MG tablet Commonly known as:  PERCOCET/ROXICET Take 1 tablet by mouth every 6 (six) hours as needed for severe pain.   simvastatin 20 MG tablet Commonly known as:  ZOCOR Take 20 mg by mouth every evening.   triamterene-hydrochlorothiazide 37.5-25 MG tablet Commonly known as:  MAXZIDE-25 Take 0.5 tablets by mouth daily.       Diagnostic Studies: Dg Lumbar Spine Complete  Result Date: 01/23/2016 CLINICAL DATA:  70 y/o  F; L1-2 and L2-3 TLIF. EXAM: DG C-ARM 61-120 MIN; LUMBAR SPINE - COMPLETE 4+ VIEW COMPARISON:  11/18/2015 lumbar MRI. FINDINGS: Interval revision of posterior instrumented fusion with extension from L1-L5 with vertical rods fixed by transpedicular screws. Grade 2 L4-5 anterolisthesis. L1 through L3 interbody fusion. L1 through L4 laminectomy. IMPRESSION: Extension of posterior instrumented fusion from L1-L5. Electronically Signed   By: Mitzi Hansen M.D.   On: 01/23/2016 17:58   Dg C-arm 61-120 Min  Result Date: 01/23/2016 CLINICAL DATA:  70 y/o  F; L1-2 and L2-3 TLIF. EXAM: DG C-ARM 61-120 MIN; LUMBAR SPINE - COMPLETE 4+ VIEW COMPARISON:  11/18/2015 lumbar MRI. FINDINGS: Interval revision of posterior instrumented fusion with extension from L1-L5 with vertical rods fixed by transpedicular screws. Grade 2 L4-5 anterolisthesis. L1 through L3 interbody fusion. L1 through L4 laminectomy. IMPRESSION: Extension of  posterior instrumented fusion from L1-L5. Electronically Signed   By: Mitzi Hansen M.D.   On: 01/23/2016 17:58    Discharge Instructions    Call MD / Call 911    Complete by:  As directed    If you experience chest pain or shortness of breath, CALL 911 and be transported to the hospital emergency room.  If you develope a fever above 101 F, pus (white drainage) or increased drainage or redness at the wound, or calf pain, call your surgeon's office.   Constipation Prevention    Complete by:  As directed    Drink plenty of fluids.  Prune juice may be helpful.  You may use a stool softener, such as Colace (over the counter) 100 mg twice a day.  Use MiraLax (over the counter) for constipation as needed.   Diet - low sodium heart healthy    Complete by:  As directed    Discharge instructions    Complete by:  As  directed    OK TO SHOWER 5 DAYS POSTOP BUT NO TUB SOAKING.  DO NOT APPLY ANY CREAMS OR OINTMENTS TO INCISION.  DAILY DRESSING CHANGES WITH 4X4 GAUZE AND TAPE. BACK PRECAUTIONS.  MUST HAVE BRACE ON WHEN UP AND AMBULATING.  NO BENDING, TWISTING, LIFTING.   Increase activity slowly as tolerated    Complete by:  As directed    Lifting restrictions    Complete by:  As directed    No lifting      Follow-up Information    NITKA,[name] E, MD Follow up in 2 week(s).   Specialty:  Orthopedic Surgery Contact information: [address] [place] [address] [phone]        [facility] .   Why:  HHPT/ HHOT Contact information: [address] [place] [address] [phone]        Inc[facility] - Dme [facility] .   Why:  3 n 1, rolling walker will be brought to patient's room Contact information: [address] [place] [address] [phone]           Discharge Plan:  discharge to snf      Signed: Naida Sleight for Vira Browns MD [facility] 01/29/2016, 12:24 PM  Patient d/c summary note and lab  reviewed.

## 2016-01-29 NOTE — Care Management Important Message (Signed)
Important Message  Patient Details  Name: [name] MRN: [id number] Date of Birth: 1945-07-10   Medicare Important Message Given:  Yes    [name] 01/29/2016, 1:25 PM

## 2016-01-29 NOTE — Progress Notes (Signed)
SW spoke with Heather/Admissions who confirms that the pt can come to facility. SW arranged transportation for pt through [facility]. SW made pt aware. SW made nurse aware.  Crista CurbBrittney Amayrani Bennick, MSW

## 2016-01-29 NOTE — Progress Notes (Signed)
     Subjective: 6 Days Post-Op Procedure(s) (LRB): TRANSFORAMINAL LUMBAR INTERBODY FUSION (TLIF) WITH PEDICLE SCREW FIXATION 2 LEVEL WITH HARDWARE REMOVAL AND EXTENSION OF HARDWARE. (N/A) Awake,alert and oriented x 4. She is standing and transitioning better. No one at home to help during the day. BM twice today.  Patient reports pain as moderate.    Objective:   VITALS:  Temp:  [98.3 F (36.8 C)-99.8 F (37.7 C)] 98.3 F (36.8 C) (10/02 0506) Pulse Rate:  [70-84] 70 (10/02 0506) Resp:  [18] 18 (10/01 1552) BP: (96-128)/(48-72) 116/48 (10/02 0506) SpO2:  [94 %-100 %] 100 % (10/02 0506)  Neurologically intact ABD soft Neurovascular intact Sensation intact distally Intact pulses distally Dorsiflexion/Plantar flexion intact Incision: dressing C/D/I   LABS  Recent Labs  01/27/16 0513 01/28/16 0410  HGB 7.8* 7.9*  WBC 6.7 5.8  PLT 163 202    Recent Labs  01/27/16 0513  NA 136  K 3.8  CL 105  CO2 26  BUN 14  CREATININE 1.10*  GLUCOSE 111*   No results for input(s): LABPT, INR in the last 72 hours.   Assessment/Plan: 6 Days Post-Op Procedure(s) (LRB): TRANSFORAMINAL LUMBAR INTERBODY FUSION (TLIF) WITH PEDICLE SCREW FIXATION 2 LEVEL WITH HARDWARE REMOVAL AND EXTENSION OF HARDWARE. (N/A)  Up with therapy D/C IV fluids Discharge to SNF  NITKA,[name] E 01/29/2016, 12:13 PM

## 2016-02-03 IMAGING — US US ABDOMEN COMPLETE
1 series · 14 of 25 positions shown · non-contrast
Comparison: 10/07/2003

CLINICAL DATA: Epigastric abdominal pain question biliary colic,
history GERD, hyperlipidemia, hypertension

EXAM:
ULTRASOUND ABDOMEN COMPLETE

[Series 1: us abdomen complete · 0.26mm/px · 14 of 76 slices shown]
[im 1/76]
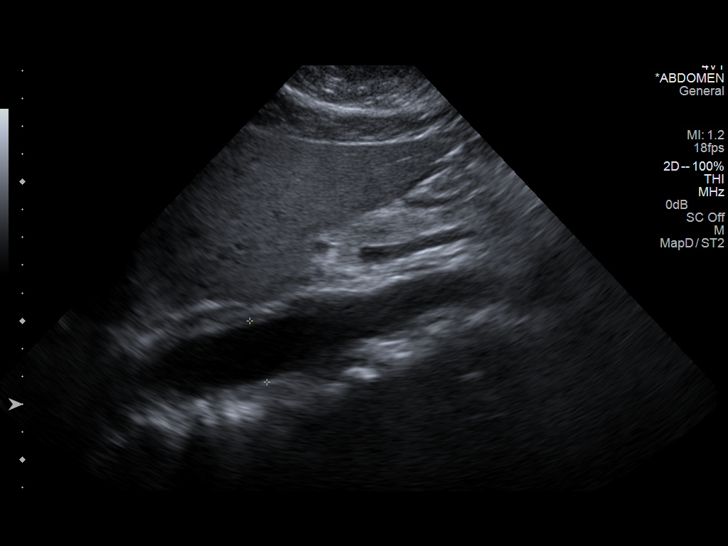
[im 7/76]
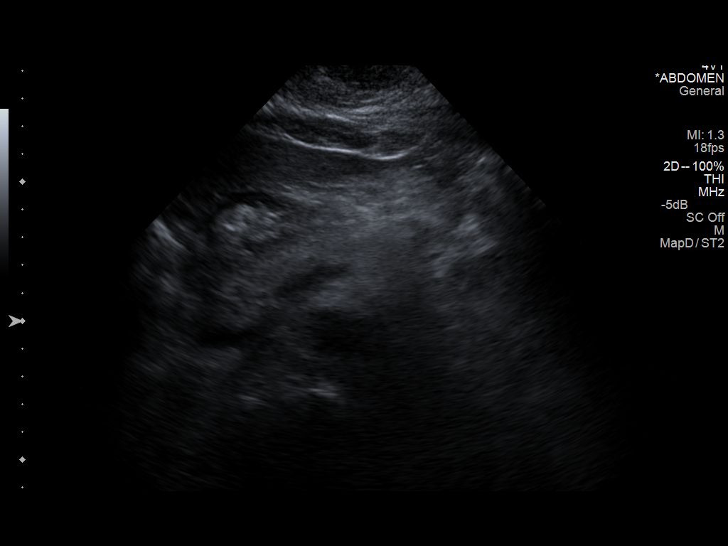
[im 13/76]
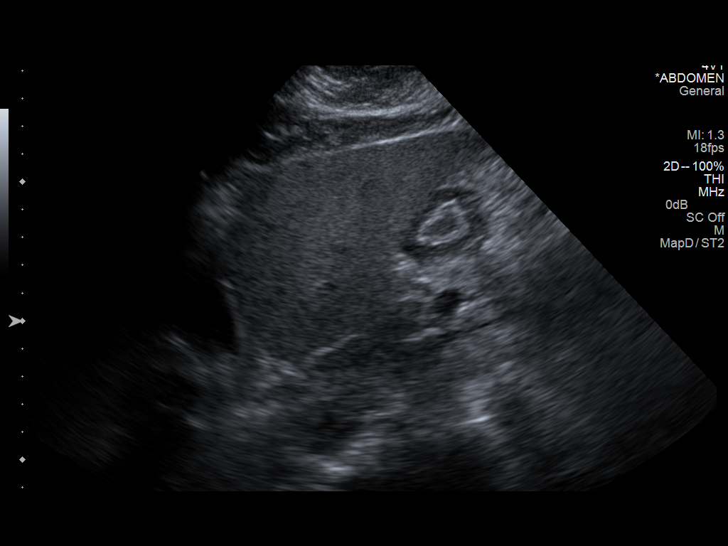
[im 19/76]
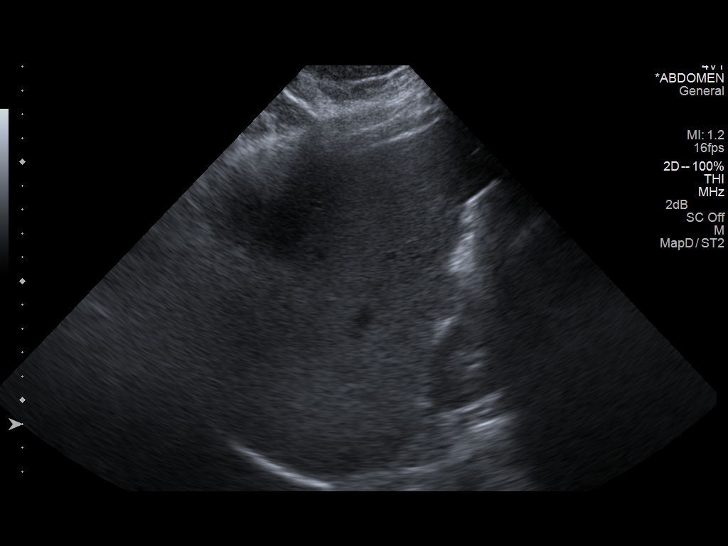
[im 26/76]
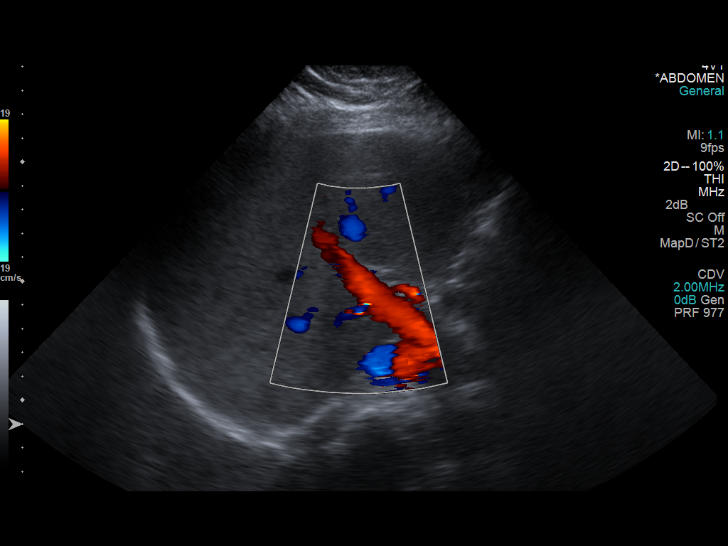
[im 29/76]
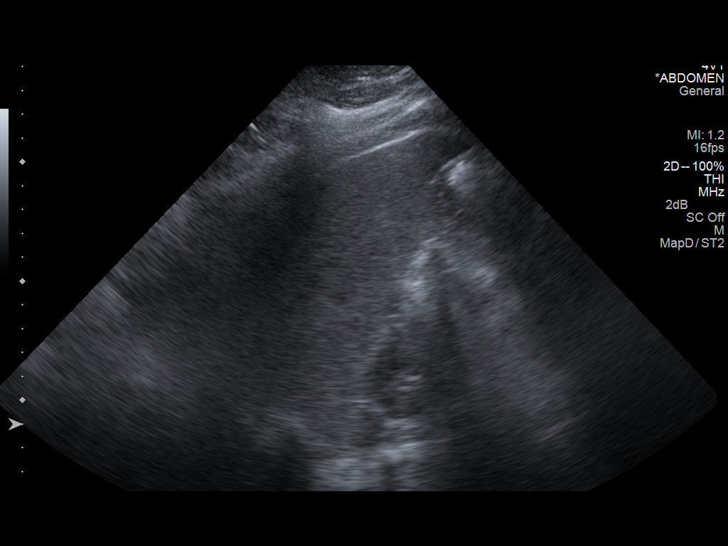
[im 35/76]
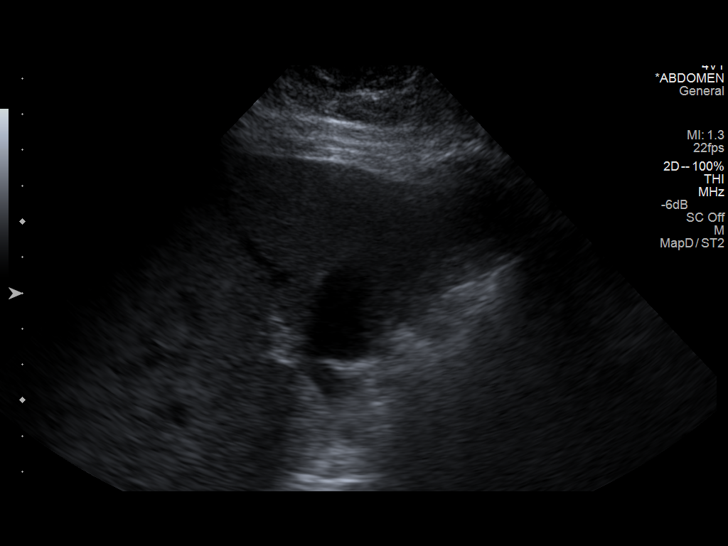
[im 41/76]
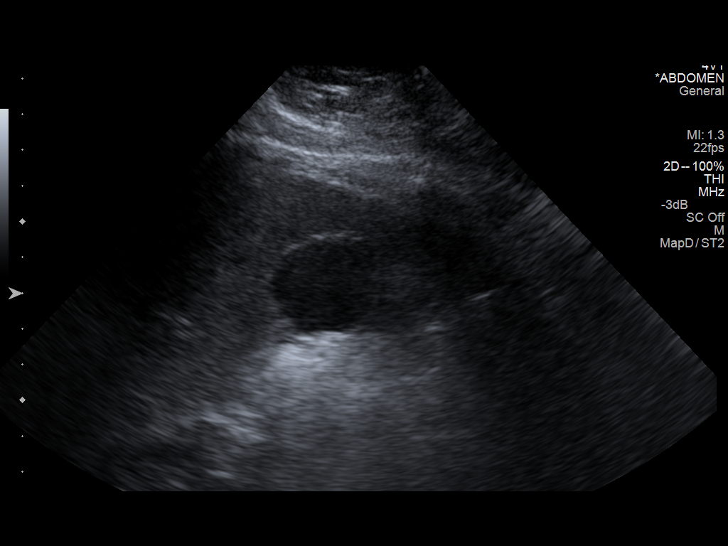
[im 47/76]
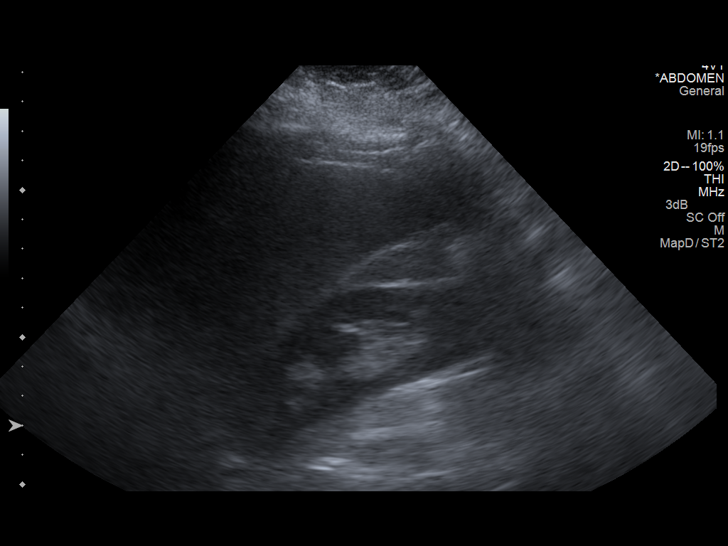
[im 51/76]
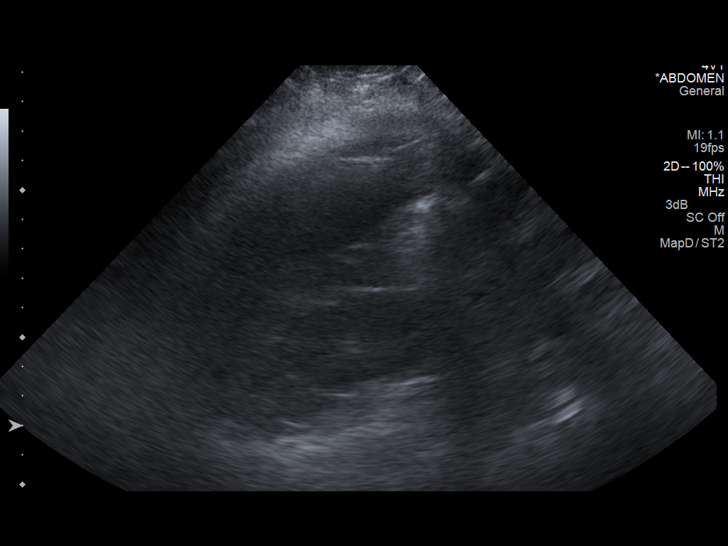
[im 57/76]
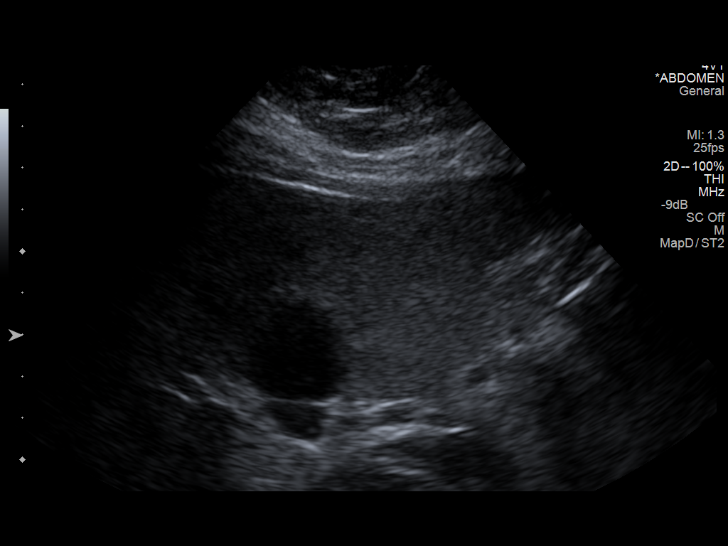
[im 63/76]
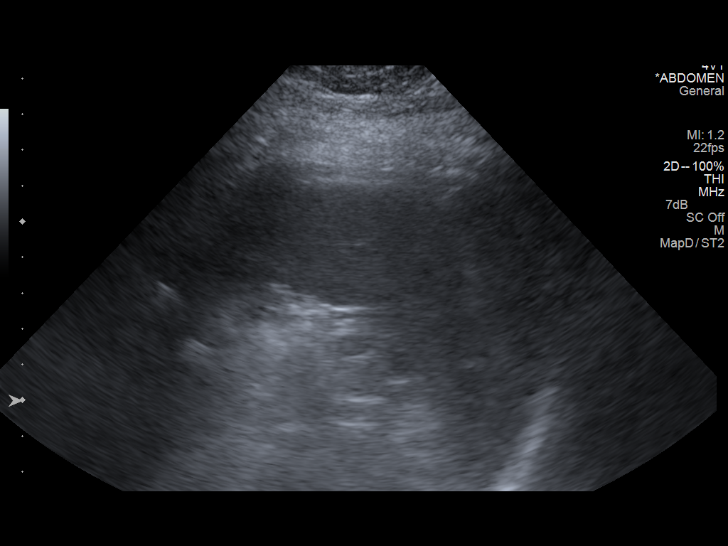
[im 69/76]
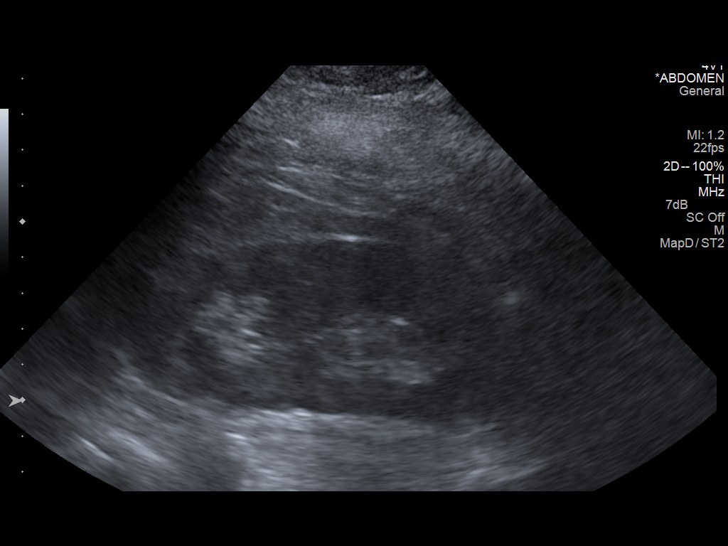
[im 76/76]
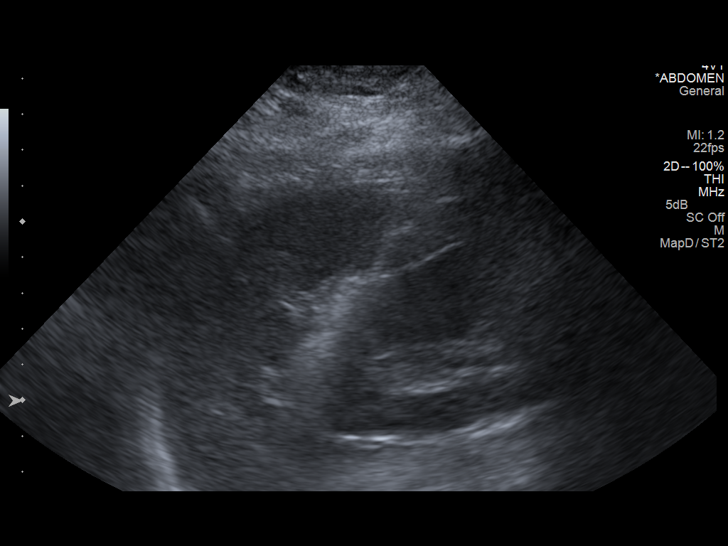

[14 of 25 positions shown; findings below may reference images not displayed]

FINDINGS: Gallbladder:

Normally distended without stones or wall thickening.

No pericholecystic fluid or sonographic Murphy sign.

Common bile duct:

Diameter: Normal caliber 5 mm diameter

Liver:

Echogenic, likely fatty infiltration, though this can be seen with
cirrhosis and certain infiltrative disorders. No focal hepatic mass
or nodularity. Hepatopetal portal venous flow.

IVC:

Normal appearance

Pancreas:

Normal appearance

Spleen:

Normal appearance, 7.5 cm length

Right Kidney:

Length: 10.4 cm.  Normal morphology without mass or hydronephrosis.

Left Kidney:

Length: 10.8 cm.  Normal morphology without mass or hydronephrosis.

Abdominal aorta:

Bifurcation obscured by bowel gas. Visualized portions normal
caliber.

Other findings:

No free fluid
IMPRESSION: Probable fatty infiltration of liver as above.

Otherwise negative exam.

## 2016-02-05 ENCOUNTER — Inpatient Hospital Stay (INDEPENDENT_AMBULATORY_CARE_PROVIDER_SITE_OTHER): Payer: Medicare Other | Admitting: Specialist

## 2016-02-05 DIAGNOSIS — M4316 Spondylolisthesis, lumbar region: Secondary | ICD-10-CM | POA: Diagnosis not present

## 2016-02-05 DIAGNOSIS — M48061 Spinal stenosis, lumbar region without neurogenic claudication: Secondary | ICD-10-CM | POA: Diagnosis not present

## 2016-02-26 ENCOUNTER — Encounter (INDEPENDENT_AMBULATORY_CARE_PROVIDER_SITE_OTHER): Payer: Self-pay | Admitting: *Deleted

## 2016-02-26 NOTE — Telephone Encounter (Signed)
This encounter was created in error - please disregard.

## 2016-02-28 ENCOUNTER — Telehealth: Payer: Self-pay

## 2016-02-28 NOTE — Telephone Encounter (Signed)
-----   Message from Donata DuffPatty L Lewis, RN sent at 10/30/2015  2:07 PM EDT -----  MRI with MRCP in 02/2016 for pancreatic cyst follow up  See 10/30/15 note

## 2016-03-01 NOTE — Telephone Encounter (Signed)
The pt had recent back surgery and is scheduled for cataract surgery this month, she wants to call back to set up for December.  She will call in a few weeks to schedule.

## 2016-03-08 ENCOUNTER — Ambulatory Visit (INDEPENDENT_AMBULATORY_CARE_PROVIDER_SITE_OTHER): Payer: Medicare Other | Admitting: Specialist

## 2016-03-14 ENCOUNTER — Ambulatory Visit (INDEPENDENT_AMBULATORY_CARE_PROVIDER_SITE_OTHER): Payer: Medicare Other | Admitting: Specialist

## 2016-03-25 DIAGNOSIS — I10 Essential (primary) hypertension: Secondary | ICD-10-CM | POA: Diagnosis not present

## 2016-03-25 DIAGNOSIS — H2511 Age-related nuclear cataract, right eye: Secondary | ICD-10-CM | POA: Diagnosis not present

## 2016-03-25 DIAGNOSIS — H2513 Age-related nuclear cataract, bilateral: Secondary | ICD-10-CM | POA: Diagnosis not present

## 2016-03-28 ENCOUNTER — Other Ambulatory Visit: Payer: Self-pay | Admitting: Sports Medicine

## 2016-03-29 NOTE — Telephone Encounter (Signed)
Rx refill request

## 2016-04-04 ENCOUNTER — Telehealth (INDEPENDENT_AMBULATORY_CARE_PROVIDER_SITE_OTHER): Payer: Self-pay | Admitting: *Deleted

## 2016-04-04 ENCOUNTER — Other Ambulatory Visit (INDEPENDENT_AMBULATORY_CARE_PROVIDER_SITE_OTHER): Payer: Self-pay | Admitting: Specialist

## 2016-04-04 MED ORDER — HYDROCODONE-ACETAMINOPHEN 5-325 MG PO TABS: 1.0000 | ORAL_TABLET | Freq: Four times a day (QID) | ORAL | 0 refills | Status: DC | PRN

## 2016-04-04 NOTE — Telephone Encounter (Signed)
Pt requesting refill of hydrocodone. 

## 2016-04-04 NOTE — Telephone Encounter (Signed)
Please advise on refill.

## 2016-04-04 NOTE — Telephone Encounter (Signed)
Rx printed and signed for her to pick up. [name]

## 2016-04-05 NOTE — Telephone Encounter (Signed)
Called and advised patient rx printed and signed ready to be picked up at front desk Thanks.

## 2016-04-25 ENCOUNTER — Ambulatory Visit: Payer: Medicare Other

## 2016-05-02 ENCOUNTER — Ambulatory Visit (INDEPENDENT_AMBULATORY_CARE_PROVIDER_SITE_OTHER): Payer: Medicare Other | Admitting: Specialist

## 2016-05-14 ENCOUNTER — Ambulatory Visit: Payer: Medicare Other

## 2016-05-14 ENCOUNTER — Telehealth (INDEPENDENT_AMBULATORY_CARE_PROVIDER_SITE_OTHER): Payer: Self-pay | Admitting: Specialist

## 2016-05-14 NOTE — Telephone Encounter (Signed)
Patient called in this morning in regards to her lower back pain she has been experiencing. She would like to know what Dr. Otelia Forbes could do for her in the meantime until her appointment in February?

## 2016-05-20 ENCOUNTER — Other Ambulatory Visit (INDEPENDENT_AMBULATORY_CARE_PROVIDER_SITE_OTHER): Payer: Self-pay | Admitting: Specialist

## 2016-05-20 MED ORDER — HYDROCODONE-ACETAMINOPHEN 5-325 MG PO TABS: 1.0000 | ORAL_TABLET | Freq: Four times a day (QID) | ORAL | 0 refills | Status: DC | PRN

## 2016-05-20 NOTE — Telephone Encounter (Signed)
Patient is aware that her rx is ready for pick up at the front desk 

## 2016-05-20 NOTE — Telephone Encounter (Signed)
Rx printed and signed. Allergic to tramadol, needs follow up appt before any further refills. jen

## 2016-05-21 ENCOUNTER — Encounter (INDEPENDENT_AMBULATORY_CARE_PROVIDER_SITE_OTHER): Payer: Self-pay

## 2016-05-21 ENCOUNTER — Ambulatory Visit (INDEPENDENT_AMBULATORY_CARE_PROVIDER_SITE_OTHER): Payer: Medicare Other | Admitting: Gastroenterology

## 2016-05-21 ENCOUNTER — Encounter: Payer: Self-pay | Admitting: Gastroenterology

## 2016-05-21 VITALS — BP 100/68 | HR 76 | Ht 63.0 in | Wt 253.4 lb

## 2016-05-21 DIAGNOSIS — R194 Change in bowel habit: Secondary | ICD-10-CM

## 2016-05-21 DIAGNOSIS — K862 Cyst of pancreas: Secondary | ICD-10-CM | POA: Diagnosis not present

## 2016-05-21 DIAGNOSIS — R1032 Left lower quadrant pain: Secondary | ICD-10-CM | POA: Diagnosis not present

## 2016-05-21 MED ORDER — HYOSCYAMINE SULFATE 0.125 MG SL SUBL: 0.1250 mg | SUBLINGUAL_TABLET | Freq: Four times a day (QID) | SUBLINGUAL | 3 refills | Status: DC | PRN

## 2016-05-21 MED ORDER — NA SULFATE-K SULFATE-MG SULF 17.5-3.13-1.6 GM/177ML PO SOLN: 1.0000 | Freq: Once | ORAL | 0 refills | Status: AC

## 2016-05-21 NOTE — Patient Instructions (Addendum)
MRI with MRCP for known pancreatic cyst follow up.    [facility]. Your appointment time is 12 noon. Please arrive 15 minutes prior to your appointment time for registration purposes. Please make certain not to have anything to eat or drink 6 hours prior to your test. In addition, if you have any metal in your body, have a pacemaker or defibrillator, please be sure to let your ordering physician know. This test typically takes 45 minutes to 1 hour to complete.  New script for levsin sublingual .125mg  tab, one tab every 5-6 hours as needed, disp 50, refills 3.  You will be set up for a colonoscopy for change in bowels, LLQ pains.

## 2016-05-21 NOTE — Progress Notes (Signed)
Review of pertinent gastrointestinal problems: 1. Routine risk for colon cancer.  Colonoscopy Dr. Evette Cristal April 2005 for "family history of polyps" the examination was normal and he recommended repeat colonoscopy at 5 years. Colonoscopy Dr. Evette Cristal 2010 again done for family history of polyps, again the examination was normal, again he recommended repeat colonoscopy at five-year interval. Colonoscopy January 2012 Dr. Sheryn Bison for "history of colon cancer" (not sure where that never came from), the examination was normal and he recommended repeat colonoscopy at 10 year interval 2. Dysphagia EGD Dr. Sheryn Bison 1 2012 showed Schatzki's ring that was dilated to 54 [place]. He also noted a small hiatal hernia. 3. Abdominal pain 2015 led to ultrasound and HIDA scan that were both essentially normal. Gastric emptying scan 02/2015 (Dr. Dulce Sellar) essentially normal.  Repeat EGD Dr. Wendall Papa found mild gastritis that was H. pylori negative. 4. Incidental pancreatic cyst: MRI 2016; The pancreatic duct is felt to be within normal limits of the body and tail measuring 2.7 mm. There is some mild dilatation the through the pancreatic head just proximal to the ampulla. There is a small cystic lesion adjacent to the distal pancreatic duct measuring 5 mm on image 94, series 7). There is a second small cystic lesion also in the head of the pancreas but more removed from the pancreatic duct measuring 6 mm on image 80, series 7. These 2 lesions are also well demonstrated on image 83, series 700;  OV 2017; recommended repeat MRI at one year interval which she decided not to schedule.   HPI: This is a   pleasant 71 year old woman whom I last saw about 6 months ago here in the office.  Chief complaint is change in her bowels, left lower quadrant pain, history of pancreatic cyst  Did not have the MRI we asked for she had during her last visit here 6 or 8 months ago because she was having back pains and eventually had a  back surgery.  She has LLQ pain, worse with BMs.  This has been going on for 3 months.  Takes MOM every other day.  Will go 2-3 times per week if she takes no MOM.  Pain is better with once she moves her bowels.  BMs are curved, small amount of blood on occaision.  The pain will be daily.  Has to really push and strain unless she takes milk of magnesia.   Takes hydrocodone  Pretty rarely, not even 2 times per week.  2-3 cousins with colon cancer also an uncle  She has lost 8 pounds since rov 6 months ago in this office.  ROS: complete GI ROS as described in HPI.  Constitutional:  She has unintentionally lost about 6 pounds to 8 pounds since her last visit.   Past Medical History:  Diagnosis Date  . Arthritis   . Cataract of right eye   . Diastolic dysfunction   . GERD (gastroesophageal reflux disease)   . Hyperlipidemia   . Hypertension   . Pancreatic cyst 02/2015   Recommend follow-up MRI in 12 months.    Past Surgical History:  Procedure Laterality Date  . ABDOMINAL HYSTERECTOMY    . ANTERIOR CERVICAL DECOMP/DISCECTOMY FUSION N/A 07/20/2012   Procedure: ANTERIOR CERVICAL DISCECTOMY FUSION C5-6, C6-7 with transgraft(Alphatec), local bone graft, plate and screws ;  Surgeon: Kerrin Champagne, MD;  Location: [facility] OR;  Service: Orthopedics;  Laterality: N/A;  . BACK SURGERY    . COLONOSCOPY    . ESOPHAGOGASTRODUODENOSCOPY    .  NECK SURGERY    . REPLACEMENT TOTAL KNEE BILATERAL    . THYROIDECTOMY, PARTIAL    . TRANSFORAMINAL LUMBAR INTERBODY FUSION (TLIF) WITH PEDICLE SCREW FIXATION 2 LEVEL N/A 01/23/2016   Procedure: TRANSFORAMINAL LUMBAR INTERBODY FUSION (TLIF) WITH PEDICLE SCREW FIXATION 2 LEVEL WITH HARDWARE REMOVAL AND EXTENSION OF HARDWARE.;  Surgeon: Kerrin Champagne, MD;  Location: [facility] OR;  Service: Orthopedics;  Laterality: N/A;  L1-L3     Current Outpatient Prescriptions  Medication Sig Dispense Refill  . aspirin EC 81 MG tablet Take 81 mg by mouth daily.    . Esomeprazole  Magnesium (NEXIUM 24HR PO) Take 1 tablet by mouth daily.    [name] gabapentin (NEURONTIN) 300 MG capsule TAKE ONE CAPSULE BY MOUTH THREE TIMES A Day 90 capsule 3  . HYDROcodone-acetaminophen (NORCO/VICODIN) 5-325 MG tablet Take 1 tablet by mouth every 6 (six) hours as needed for moderate pain. 50 tablet 0  . losartan (COZAAR) 100 MG tablet Take 100 mg by mouth daily.     . simvastatin (ZOCOR) 20 MG tablet Take 20 mg by mouth every evening.  0  . triamterene-hydrochlorothiazide (MAXZIDE-25) 37.5-25 MG tablet Take 0.5 tablets by mouth daily.   5   No current facility-administered medications for this visit.     Allergies as of 05/21/2016 - Review Complete 05/21/2016  Allergen Reaction Noted  . Lisinopril Swelling 11/10/2011  . Penicillins Rash   . Tramadol hcl Rash     Family History  Problem Relation Age of Onset  . Ovarian cancer Mother   . Heart disease Mother   . Prostate cancer Father   . Heart disease Father   . Colon cancer Father   . Breast cancer Sister   . Prostate cancer Brother   . Cystic fibrosis Sister   . Heart disease Sister   . Pancreatic cancer Neg Hx   . Rectal cancer Neg Hx   . Stomach cancer Neg Hx     Social History   Social History  . Marital status: Divorced    Spouse name: N/A  . Number of children: 2  . Years of education: N/A   Occupational History  . Retired    Social History Main Topics  . Smoking status: Never Smoker  . Smokeless tobacco: Never Used  . Alcohol use No  . Drug use: No  . Sexual activity: Not on file   Other Topics Concern  . Not on file   Social History Narrative  . No narrative on file     Physical Exam: Ht 5\' 3"  (1.6 m) Comment: height measured without shoes  Wt 253 lb 6 oz (114.9 kg)   BMI 44.88 kg/m  Constitutional: generally well-appearing, except for morbid obesity Psychiatric: alert and oriented x3 Abdomen: soft, very mildly tender left lower quadrant, nondistended, no obvious ascites, no peritoneal signs,  normal bowel sounds No peripheral edema noted in lower extremities  Assessment and plan: 71 y.o. female with change in bowels, left lower quadrant pain, history of pancreatic cyst  She had an incidental pancreatic cyst noted in 2016. This is causing no symptoms that I can tell. I think it is unlikely anything serious, we are going to set her up with repeat imaging with MRI, MRCP per standard cyst follow-up protocol. If no changes then she would not need repeat imaging for 2 years from now. She is really here to discuss changes in her bowels and left lower quadrant pains which is a new problem for her. I think this  is probably spasm related given that it improves after she moves her bowels. I'm calling her in an antispasmodic medicine. See the patient instructions for exact details. I also recommended with her change in her bowels and her left lower quadrant pain that we proceed with colonoscopy at her soonest convenience to exclude more serious pathology such as neoplasm. Her last colonoscopy was 6 years ago.  Please see the "Patient Instructions" section for addition details about the plan.    Rob Buntinganiel Jacobs, MD [facility] 05/21/2016, 10:06 AM

## 2016-05-24 ENCOUNTER — Ambulatory Visit
Admission: RE | Admit: 2016-05-24 | Discharge: 2016-05-24 | Disposition: A | Discharge status: Home / Self Care | Payer: Medicare Other | Source: Ambulatory Visit | Attending: Internal Medicine | Admitting: Internal Medicine

## 2016-05-24 DIAGNOSIS — Z1231 Encounter for screening mammogram for malignant neoplasm of breast: Secondary | ICD-10-CM

## 2016-05-27 ENCOUNTER — Ambulatory Visit (HOSPITAL_COMMUNITY)
Admission: RE | Admit: 2016-05-27 | Discharge: 2016-05-27 | Disposition: A | Discharge status: Home / Self Care | Payer: Medicare Other | Source: Ambulatory Visit | Attending: Gastroenterology | Admitting: Gastroenterology

## 2016-05-27 ENCOUNTER — Telehealth: Payer: Self-pay | Admitting: Gastroenterology

## 2016-05-27 ENCOUNTER — Other Ambulatory Visit: Payer: Self-pay | Admitting: Gastroenterology

## 2016-05-27 DIAGNOSIS — K862 Cyst of pancreas: Secondary | ICD-10-CM | POA: Insufficient documentation

## 2016-05-27 DIAGNOSIS — R935 Abnormal findings on diagnostic imaging of other abdominal regions, including retroperitoneum: Secondary | ICD-10-CM | POA: Diagnosis not present

## 2016-05-27 LAB — POCT I-STAT CREATININE: Creatinine, Ser: 1.2 mg/dL — ABNORMAL HIGH (ref 0.44–1.00)

## 2016-05-27 MED ORDER — GADOBENATE DIMEGLUMINE 529 MG/ML IV SOLN
20.0000 mL | Freq: Once | INTRAVENOUS | Status: AC | PRN
  Administered 2016-05-27: 20 mL via INTRAVENOUS

## 2016-05-27 NOTE — Telephone Encounter (Signed)
The pt called back and states the prep is too expensive.  I have called the pharmacy and gave the voucher info and she is aware she will pay $50

## 2016-05-27 NOTE — Telephone Encounter (Signed)
Pt has no idea what the prep is going to cost, she has not called her pharmacy.  She will call and see if it is more than $50.  If so, she will call back for a voucher.

## 2016-05-28 ENCOUNTER — Telehealth: Payer: Self-pay | Admitting: Gastroenterology

## 2016-05-28 NOTE — Telephone Encounter (Signed)
See results note. 

## 2016-06-03 ENCOUNTER — Ambulatory Visit: Payer: Medicare Other | Admitting: Nurse Practitioner

## 2016-06-07 ENCOUNTER — Ambulatory Visit (INDEPENDENT_AMBULATORY_CARE_PROVIDER_SITE_OTHER): Payer: Medicare Other | Admitting: Specialist

## 2016-06-10 ENCOUNTER — Ambulatory Visit (INDEPENDENT_AMBULATORY_CARE_PROVIDER_SITE_OTHER): Payer: Medicare Other | Admitting: Specialist

## 2016-06-10 ENCOUNTER — Other Ambulatory Visit: Payer: Self-pay | Admitting: Sports Medicine

## 2016-06-14 DIAGNOSIS — H2513 Age-related nuclear cataract, bilateral: Secondary | ICD-10-CM | POA: Diagnosis not present

## 2016-06-14 DIAGNOSIS — I10 Essential (primary) hypertension: Secondary | ICD-10-CM | POA: Diagnosis not present

## 2016-06-14 DIAGNOSIS — H25013 Cortical age-related cataract, bilateral: Secondary | ICD-10-CM | POA: Diagnosis not present

## 2016-07-01 ENCOUNTER — Other Ambulatory Visit: Payer: Self-pay | Admitting: Sports Medicine

## 2016-07-03 ENCOUNTER — Ambulatory Visit: Payer: Medicare Other | Admitting: Nurse Practitioner

## 2016-07-04 ENCOUNTER — Telehealth (INDEPENDENT_AMBULATORY_CARE_PROVIDER_SITE_OTHER): Payer: Self-pay

## 2016-07-04 NOTE — Telephone Encounter (Signed)
01/23/16-SU    Pt called and would like an appointment ASAP. States she is having pain near the incision site. She only wants to see Dr [name].    [phone]

## 2016-07-05 NOTE — Telephone Encounter (Signed)
I called and LMOM that since she only wants to see Dr Otelia SergeantNitka that the first avaliable is on 07/11/2016 @ 930 and that she may have to wait as she is a work in.

## 2016-07-08 ENCOUNTER — Telehealth (INDEPENDENT_AMBULATORY_CARE_PROVIDER_SITE_OTHER): Payer: Self-pay | Admitting: Specialist

## 2016-07-08 NOTE — Telephone Encounter (Signed)
Patient called saying that [name] has been scheduled to appear in court for jury duty May 15th. [name] was wanting a letter from Dr. Otelia SergeantNitka stating that is excused from jury duty due to her back pain. Her jury # [id number] Panel # [id number] Please mail to her home address. Thank you!

## 2016-07-08 NOTE — Telephone Encounter (Signed)
Patient called saying that she has been scheduled to appear in court for jury duty May 15th. She was wanting a letter from Dr. Otelia SergeantNitka stating that is excused from jury duty due to her back pain. Her jury # [id number] Panel # [phone]

## 2016-07-10 ENCOUNTER — Encounter (INDEPENDENT_AMBULATORY_CARE_PROVIDER_SITE_OTHER): Payer: Self-pay | Admitting: Specialist

## 2016-07-10 NOTE — Telephone Encounter (Signed)
Jury note performed and printed so that it may be signed to give this patient. jen

## 2016-07-11 ENCOUNTER — Ambulatory Visit (INDEPENDENT_AMBULATORY_CARE_PROVIDER_SITE_OTHER): Payer: Medicare Other

## 2016-07-11 ENCOUNTER — Ambulatory Visit (INDEPENDENT_AMBULATORY_CARE_PROVIDER_SITE_OTHER): Payer: Medicare Other | Admitting: Specialist

## 2016-07-11 ENCOUNTER — Encounter (INDEPENDENT_AMBULATORY_CARE_PROVIDER_SITE_OTHER): Payer: Self-pay | Admitting: Specialist

## 2016-07-11 VITALS — BP 131/79 | HR 67 | Ht 63.0 in | Wt 253.0 lb

## 2016-07-11 DIAGNOSIS — M461 Sacroiliitis, not elsewhere classified: Secondary | ICD-10-CM | POA: Diagnosis not present

## 2016-07-11 DIAGNOSIS — M1612 Unilateral primary osteoarthritis, left hip: Secondary | ICD-10-CM | POA: Diagnosis not present

## 2016-07-11 DIAGNOSIS — M545 Low back pain: Secondary | ICD-10-CM

## 2016-07-11 MED ORDER — METHYLPREDNISOLONE 2 MG PO TABS: ORAL_TABLET | ORAL | 0 refills | Status: DC

## 2016-07-11 NOTE — Progress Notes (Signed)
Office Visit Note   Patient: [name]           Date of Birth: 09-12-1945           MRN: [id number] Visit Date: 07/11/2016              Requested by: No referring provider defined for this encounter. PCP: No PCP Per Patient   Assessment & Plan: Visit Diagnoses:  1. Low back pain, unspecified back pain laterality, unspecified chronicity, with sciatica presence unspecified   2. SI (sacroiliac) joint inflammation (HCC)   3. Primary osteoarthritis of left hip     Plan: For patient's pain we'll attempt conservative treatment with Medrol dose pack Taper. States that she is scheduled to have an eye procedure next week and she was advised to contact her surgeon to see if it is okay to start the medication that was prescribed today. Follow-up with [place] in 6 weeks. If she continues to have the same areas of pain addressed today we did discuss possible left SI joint and left hip intra-articular injections. All questions answered.  Follow-Up Instructions: Return in about 6 weeks (around 08/22/2016).   Orders:  Orders Placed This Encounter  Procedures  . XR Lumbar Spine 2-3 Views   Meds ordered this encounter  Medications  . methylPREDNISolone (MEDROL) 2 MG tablet    Sig: 6 day taper to be taken as directed    Dispense:  21 tablet    Refill:  0      Procedures: No procedures performed   Clinical Data: No additional findings.   Subjective: Chief Complaint  Patient presents with  . Lower Back - Pain    [name] is here for pain in her back. She states that the pain starts at about the incision and radiates to the left side and there is a burning sensation into the left hip.  She states that there is a soreness in the left lower back also  Patient has not been seen since her 2 week postop appointment after lumbar fusion. States that she had a close friend that passed away in April 11, 2023 and then a young family member just recently. States that she had been doing well up  until about 4 weeks ago when she began having pain at the left SI joint and also some discomfort in the left groin pain when she is ambulating. No completes of pain radiating down either leg.  Review of Systems  Constitutional: Negative.   HENT: Negative.   Eyes: Positive for visual disturbance (Scheduled for eye surgery next week).  Respiratory: Negative.   Musculoskeletal: Positive for back pain.  Psychiatric/Behavioral: Negative.      Objective: Vital Signs: BP 131/79 (BP Location: Left Arm, Patient Position: Sitting)   Pulse 67   Ht 5\' 3"  (1.6 m)   Wt 253 lb (114.8 kg)   BMI 44.82 kg/m   Physical Exam  Constitutional: She is oriented to person, place, and time. No distress.  HENT:  Head: Normocephalic.  Eyes: EOM are normal. Pupils are equal, round, and reactive to light.  Neck: Normal range of motion.  Pulmonary/Chest: No respiratory distress.  Musculoskeletal:  Negative logroll bilateral hips. No lumbar paraspinal tenderness. She is moderate to markedly tender over the left SI joint. Negative straight leg raise.  Neurological: She is alert and oriented to person, place, and time.  Skin: Skin is warm and dry.  Psychiatric: She has a normal mood and affect.    Ortho Exam  Specialty Comments:  No specialty comments available.  Imaging: Xr Lumbar Spine 2-3 Views  Result Date: 07/11/2016 X-rays lumbar spine show that her hardware is intact. No acute findings. Bilateral SI joint degenerative changes. Also has bilateral hip DJD.    PMFS History: Patient Active Problem List   Diagnosis Date Noted  . Spinal stenosis, lumbar region, with neurogenic claudication 01/23/2016    Class: Chronic  . Other secondary scoliosis, 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    Class: Chronic  . Spondylolisthesis of 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  . Spondylolisthesis, 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  . Diastolic dysfunction   . Hypokalemia 03/08/2015  . Bradycardia 03/08/2015  . Nausea and vomiting  03/03/2015  . Syncope and collapse 03/02/2015  . Pancreatic cyst 02/28/2015  . HNP (herniated nucleus pulposus), cervical 07/20/2012    Class: Acute  . Cervical spondylosis without myelopathy 07/20/2012    Class: Chronic  . Hyperlipidemia   . GERD (gastroesophageal reflux disease)   . HYPERLIPIDEMIA 04/16/2010  . Essential hypertension 04/16/2010  . GERD 04/16/2010  . HIATAL HERNIA 04/16/2010  . OSTEOARTHRITIS 04/16/2010  . DYSPHAGIA UNSPECIFIED 04/16/2010   Past Medical History:  Diagnosis Date  . Arthritis   . Cataract of right eye   . Diastolic dysfunction   . GERD (gastroesophageal reflux disease)   . Hyperlipidemia   . Hypertension   . Pancreatic cyst 02/2015   Recommend follow-up MRI in 12 months.    Family History  Problem Relation Age of Onset  . Ovarian cancer Mother   . Heart disease Mother   . Prostate cancer Father   . Heart disease Father   . Colon cancer Father   . Breast cancer Sister   . Prostate cancer Brother   . Cystic fibrosis Sister   . Heart disease Sister   . Pancreatic cancer Neg Hx   . Rectal cancer Neg Hx   . Stomach cancer Neg Hx     Past Surgical History:  Procedure Laterality Date  . ABDOMINAL HYSTERECTOMY    . ANTERIOR CERVICAL DECOMP/DISCECTOMY FUSION N/A 07/20/2012   Procedure: ANTERIOR CERVICAL DISCECTOMY FUSION C5-6, C6-7 with transgraft(Alphatec), local bone graft, plate and screws ;  Surgeon: Kerrin ChampagneJames E Nitka, MD;  Location: [facility] OR;  Service: Orthopedics;  Laterality: N/A;  . BACK SURGERY    . COLONOSCOPY    . ESOPHAGOGASTRODUODENOSCOPY    . NECK SURGERY    . REPLACEMENT TOTAL KNEE BILATERAL    . THYROIDECTOMY, PARTIAL    . TRANSFORAMINAL LUMBAR INTERBODY FUSION (TLIF) WITH PEDICLE SCREW FIXATION 2 LEVEL N/A 01/23/2016   Procedure: TRANSFORAMINAL LUMBAR INTERBODY FUSION (TLIF) WITH PEDICLE SCREW FIXATION 2 LEVEL WITH HARDWARE REMOVAL AND EXTENSION OF HARDWARE.;  Surgeon: Kerrin ChampagneJames E Nitka, MD;  Location: [facility] OR;  Service: Orthopedics;   Laterality: N/A;  L1-L3    Social History   Occupational History  . Retired    Social History Main Topics  . Smoking status: Never Smoker  . Smokeless tobacco: Never Used  . Alcohol use No  . Drug use: No  . Sexual activity: Not on file

## 2016-07-11 NOTE — Telephone Encounter (Signed)
Patient seen in office today and will get letter then

## 2016-07-16 ENCOUNTER — Encounter: Payer: Medicare Other | Admitting: Gastroenterology

## 2016-07-16 DIAGNOSIS — H2511 Age-related nuclear cataract, right eye: Secondary | ICD-10-CM | POA: Diagnosis not present

## 2016-07-18 ENCOUNTER — Telehealth (INDEPENDENT_AMBULATORY_CARE_PROVIDER_SITE_OTHER): Payer: Self-pay | Admitting: Specialist

## 2016-07-18 ENCOUNTER — Telehealth (INDEPENDENT_AMBULATORY_CARE_PROVIDER_SITE_OTHER): Payer: Self-pay | Admitting: *Deleted

## 2016-07-18 NOTE — Telephone Encounter (Signed)
Pt called stating the RX should be sent to [facility] [address]

## 2016-07-18 NOTE — Telephone Encounter (Signed)
[facility] called stating this prescription is written for 2 mg instead 4 mg for Methylprednisolone.  They state it comes in 4 mg tablets not 2mg , please call them and verify the 4 mg.  [facility] is [facility]. CB[address][phone].  Thank you.

## 2016-07-18 NOTE — Telephone Encounter (Signed)
Called and spoke with pharmacist tech and verified 4mg  tabs vs 2mg 

## 2016-07-18 NOTE — Telephone Encounter (Signed)
Can you call and ok this change
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
## 2016-07-29 ENCOUNTER — Other Ambulatory Visit: Payer: Self-pay | Admitting: Sports Medicine

## 2016-08-06 ENCOUNTER — Encounter: Payer: Self-pay | Admitting: Gastroenterology

## 2016-08-08 ENCOUNTER — Ambulatory Visit (INDEPENDENT_AMBULATORY_CARE_PROVIDER_SITE_OTHER): Payer: Medicare Other | Admitting: Specialist

## 2016-08-19 ENCOUNTER — Telehealth: Payer: Self-pay

## 2016-08-19 ENCOUNTER — Ambulatory Visit: Payer: Medicare Other | Admitting: Nurse Practitioner

## 2016-08-19 NOTE — Telephone Encounter (Signed)
Pt states that she is feeling faint and vomiting. She is concerned that she cannot go until 230 pm tomorrow without eating for her procedure. Per Dr. Daine Gip to cx without fee if she reschedules. Rescheduled to 10/09/16 am and scheduled another previsit, as it will be greater than 2 months since she was first seen in [place].

## 2016-08-20 ENCOUNTER — Encounter: Payer: Medicare Other | Admitting: Gastroenterology

## 2016-08-22 ENCOUNTER — Ambulatory Visit (INDEPENDENT_AMBULATORY_CARE_PROVIDER_SITE_OTHER): Payer: Medicare Other | Admitting: Specialist

## 2016-08-23 ENCOUNTER — Encounter: Payer: Self-pay | Admitting: Nurse Practitioner

## 2016-08-23 ENCOUNTER — Other Ambulatory Visit: Payer: Self-pay | Admitting: Sports Medicine

## 2016-08-23 ENCOUNTER — Other Ambulatory Visit (INDEPENDENT_AMBULATORY_CARE_PROVIDER_SITE_OTHER): Payer: Medicare Other

## 2016-08-23 ENCOUNTER — Ambulatory Visit: Payer: Medicare Other | Admitting: Nurse Practitioner

## 2016-08-23 ENCOUNTER — Ambulatory Visit (INDEPENDENT_AMBULATORY_CARE_PROVIDER_SITE_OTHER): Payer: Medicare Other | Admitting: Nurse Practitioner

## 2016-08-23 VITALS — BP 140/84 | HR 58 | Temp 97.4°F | Ht 64.0 in | Wt 257.0 lb

## 2016-08-23 DIAGNOSIS — R5383 Other fatigue: Secondary | ICD-10-CM

## 2016-08-23 DIAGNOSIS — E782 Mixed hyperlipidemia: Secondary | ICD-10-CM

## 2016-08-23 DIAGNOSIS — I10 Essential (primary) hypertension: Secondary | ICD-10-CM

## 2016-08-23 DIAGNOSIS — Z9889 Other specified postprocedural states: Secondary | ICD-10-CM | POA: Insufficient documentation

## 2016-08-23 DIAGNOSIS — E876 Hypokalemia: Secondary | ICD-10-CM

## 2016-08-23 DIAGNOSIS — K219 Gastro-esophageal reflux disease without esophagitis: Secondary | ICD-10-CM | POA: Diagnosis not present

## 2016-08-23 DIAGNOSIS — E890 Postprocedural hypothyroidism: Secondary | ICD-10-CM | POA: Insufficient documentation

## 2016-08-23 LAB — COMPREHENSIVE METABOLIC PANEL
ALBUMIN: 4.2 g/dL (ref 3.5–5.2)
ALT: 4 U/L (ref 0–35)
AST: 12 U/L (ref 0–37)
Alkaline Phosphatase: 67 U/L (ref 39–117)
BUN: 11 mg/dL (ref 6–23)
CALCIUM: 9.5 mg/dL (ref 8.4–10.5)
CHLORIDE: 101 meq/L (ref 96–112)
CO2: 30 meq/L (ref 19–32)
Creatinine, Ser: 1.1 mg/dL (ref 0.40–1.20)
GFR: 63.23 mL/min (ref >60.00)
Glucose, Bld: 91 mg/dL (ref 70–99)
POTASSIUM: 3.9 meq/L (ref 3.5–5.1)
Sodium: 138 mEq/L (ref 135–145)
Total Bilirubin: 0.4 mg/dL (ref 0.2–1.2)
Total Protein: 7.6 g/dL (ref 6.0–8.3)

## 2016-08-23 LAB — CBC WITH DIFFERENTIAL/PLATELET
BASOS PCT: 0.8 % (ref 0.0–3.0)
Basophils Absolute: 0 10*3/uL (ref 0.0–0.1)
EOS ABS: 0.2 10*3/uL (ref 0.0–0.7)
EOS PCT: 4.6 % (ref 0.0–5.0)
HEMATOCRIT: 37.7 % (ref 36.0–46.0)
HEMOGLOBIN: 12 g/dL (ref 12.0–15.0)
Lymphocytes Relative: 44 % (ref 12.0–46.0)
Lymphs Abs: 1.7 10*3/uL (ref 0.7–4.0)
MCHC: 31.7 g/dL (ref 30.0–36.0)
MCV: 76.5 fl — AB (ref 78.0–100.0)
MONOS PCT: 8.5 % (ref 3.0–12.0)
Monocytes Absolute: 0.3 10*3/uL (ref 0.1–1.0)
NEUTROS ABS: 1.6 10*3/uL (ref 1.4–7.7)
Neutrophils Relative %: 42.1 % — ABNORMAL LOW (ref 43.0–77.0)
PLATELETS: 236 10*3/uL (ref 150.0–400.0)
RBC: 4.93 Mil/uL (ref 3.87–5.11)
RDW: 18.5 % — AB (ref 11.5–15.5)
WBC: 3.8 10*3/uL — AB (ref 4.0–10.5)

## 2016-08-23 LAB — LIPID PANEL
CHOL/HDL RATIO: 4
CHOLESTEROL: 168 mg/dL (ref 0–200)
HDL: 45.6 mg/dL (ref >39.00)
NONHDL: 122.36
Triglycerides: 215 mg/dL — ABNORMAL HIGH (ref 0.0–149.0)
VLDL: 43 mg/dL — AB (ref 0.0–40.0)

## 2016-08-23 LAB — TSH: TSH: 1.91 u[IU]/mL (ref 0.35–4.50)

## 2016-08-23 LAB — SEDIMENTATION RATE: Sed Rate: 69 mm/hr — ABNORMAL HIGH (ref 0–30)

## 2016-08-23 LAB — LDL CHOLESTEROL, DIRECT: LDL DIRECT: 60 mg/dL

## 2016-08-23 MED ORDER — SIMVASTATIN 20 MG PO TABS: 20.0000 mg | ORAL_TABLET | Freq: Every day | ORAL | 1 refills | Status: DC

## 2016-08-23 MED ORDER — TRIAMTERENE-HCTZ 37.5-25 MG PO TABS: 0.5000 | ORAL_TABLET | Freq: Every day | ORAL | 1 refills | Status: DC

## 2016-08-23 MED ORDER — ESOMEPRAZOLE MAGNESIUM 20 MG PO CPDR: 20.0000 mg | DELAYED_RELEASE_CAPSULE | Freq: Every day | ORAL | 1 refills | Status: DC

## 2016-08-23 MED ORDER — LOSARTAN POTASSIUM 100 MG PO TABS: 100.0000 mg | ORAL_TABLET | Freq: Every day | ORAL | 1 refills | Status: DC

## 2016-08-23 NOTE — Progress Notes (Signed)
Pre visit review using our clinic review tool, if applicable. No additional management support is needed unless otherwise documented below in the visit note. 

## 2016-08-23 NOTE — Patient Instructions (Addendum)
If you have medicare related insurance (such as traditional Medicare, [employer], [employer], or similar), Please make an appointment at the scheduling desk with Noreene Larsson, the [employer], for your Wellness visit in this office, which is a benefit with your insurance.  Sign medical release to get record from previous pcp.  Will review records prior to sending medication refills.

## 2016-08-23 NOTE — Progress Notes (Signed)
Subjective:    Patient ID: [name], female    DOB: 1946-02-20, 71 y.o.   MRN: [id number]  Patient presents today for establish care (new patient)  HPI Previous pcp: [facility]. Last seen 09/2015.  Last CPE: unknown.  Needs medication refilled.  Fatigue: Ongoing for several months. Also has joint stiffness with prolonged inactivity.  Chronic back pain due to spinal stenosis and DDD: Managed by [facility] with gabapentin.   GERD: With nausea and early satiety. Had endoscopy last year.  Current past of [facility]  HTN: Controlled  Hyperlipidemia: Denies any adverse reaction with lipitor.  Immunizations: (TDAP, Hep C screen, Pneumovax, Influenza, zoster)  Health Maintenance  Topic Date Due  .  Hepatitis C: One time screening is recommended by Center for Disease Control  (CDC) for  adults born from [birth year] through 1964.   25-Jan-1946  . Tetanus Vaccine  01/20/1966  . DEXA scan (bone density measurement)  01/21/2012  . Flu Shot  11/27/2016  . Pneumonia vaccines (2 of 2 - PCV13) 01/24/2017  . Mammogram  05/24/2018  . Colon Cancer Screening  05/16/2020   Weight:  Wt Readings from Last 3 Encounters:  08/23/16 257 lb (116.6 kg)  07/11/16 253 lb (114.8 kg)  05/21/16 253 lb 6 oz (114.9 kg)   Fall Risk: Fall Risk  08/23/2016  Falls in the past year? Yes  Number falls in past yr: 2 or more  Injury with Fall? No   Home Safety:home alone, has adult son who checks on her frequently Depression/Suicide: Depression screen [facility] 2/9 08/23/2016  Decreased Interest 0  Down, Depressed, Hopeless 0  PHQ - 2 Score 0   No flowsheet data found. Colonoscopy (every 5-87yr, >50-726yr:scheduled for 10/06/2016 with [facility] Dexa (every 2-5y71yr>65y25yreeded Mammogram (yearly, >72yr24yr to date, done by [facility] Vision:up to date, cataracts removed Dental:upper and lower dentures Advanced Directive: Advanced Directives 01/23/2016  Does Patient Have a  Medical Advance Directive? No  Would patient like information on creating a medical advance directive? -    Medications and allergies reviewed with patient and updated if appropriate.  Patient Active Problem List   Diagnosis Date Noted  . S/P partial thyroidectomy 08/23/2016  . Spinal stenosis, lumbar region, with neurogenic claudication 01/23/2016    Class: Chronic  . Other secondary scoliosis, 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    Class: Chronic  . Spondylolisthesis of 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  . Spondylolisthesis, 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  . Diastolic dysfunction   . Hypokalemia 03/08/2015  . Bradycardia 03/08/2015  . Nausea and vomiting 03/03/2015  . Syncope and collapse 03/02/2015  . Pancreatic cyst 02/28/2015  . HNP (herniated nucleus pulposus), cervical 07/20/2012    Class: Acute  . Cervical spondylosis without myelopathy 07/20/2012    Class: Chronic  . Hyperlipidemia   . GERD (gastroesophageal reflux disease)   . HYPERLIPIDEMIA 04/16/2010  . Essential hypertension 04/16/2010  . GERD 04/16/2010  . HIATAL HERNIA 04/16/2010  . OSTEOARTHRITIS 04/16/2010  . DYSPHAGIA UNSPECIFIED 04/16/2010    Current Outpatient Prescriptions on File Prior to Visit  Medication Sig Dispense Refill  . aspirin EC 81 MG tablet Take 81 mg by mouth daily.    . gabMarland Kitchenpentin (NEURONTIN) 300 MG capsule Take 1 capsule orally three times daily, office visit needed before next refill 90 capsule 0  . hyoscyamine (LEVSIN SL) 0.125 MG SL tablet Place 1 tablet (0.125 mg total) under the tongue every 6 (six) hours as needed. 50 tablet 3   No current facility-administered  medications on file prior to visit.     Past Medical History:  Diagnosis Date  . Arthritis   . Cataract of right eye   . Diastolic dysfunction   . Dizzy   . GERD (gastroesophageal reflux disease)   . Hyperlipidemia   . Hypertension   . Pancreatic cyst 02/2015   Recommend follow-up MRI in 12 months.    Past Surgical History:  Procedure  Laterality Date  . ABDOMINAL HYSTERECTOMY    . ANTERIOR CERVICAL DECOMP/DISCECTOMY FUSION N/A 07/20/2012   Procedure: ANTERIOR CERVICAL DISCECTOMY FUSION C5-6, C6-7 with transgraft(Alphatec), local bone graft, plate and screws ;  Surgeon: Jessy Oto, MD;  Location: [place];  Service: Orthopedics;  Laterality: N/A;  . BACK SURGERY    . COLONOSCOPY    . ESOPHAGOGASTRODUODENOSCOPY    . NECK SURGERY    . REPLACEMENT TOTAL KNEE BILATERAL    . THYROIDECTOMY, PARTIAL    . TRANSFORAMINAL LUMBAR INTERBODY FUSION (TLIF) WITH PEDICLE SCREW FIXATION 2 LEVEL N/A 01/23/2016   Procedure: TRANSFORAMINAL LUMBAR INTERBODY FUSION (TLIF) WITH PEDICLE SCREW FIXATION 2 LEVEL WITH HARDWARE REMOVAL AND EXTENSION OF HARDWARE.;  Surgeon: Jessy Oto, MD;  Location: [place];  Service: Orthopedics;  Laterality: N/A;  L1-L3     Social History   Social History  . Marital status: Divorced    Spouse name: N/A  . Number of children: 2  . Years of education: N/A   Occupational History  . Retired    Social History Main Topics  . Smoking status: Never Smoker  . Smokeless tobacco: Never Used  . Alcohol use No  . Drug use: No  . Sexual activity: Not Asked   Other Topics Concern  . None   Social History Narrative  . None    Family History  Problem Relation Age of Onset  . Ovarian cancer Mother   . Heart disease Mother   . Prostate cancer Father   . Heart disease Father   . Colon cancer Father   . Breast cancer Sister   . Prostate cancer Brother   . Cystic fibrosis Sister   . Heart disease Sister   . Pancreatic cancer Neg Hx   . Rectal cancer Neg Hx   . Stomach cancer Neg Hx         Review of Systems  Constitutional: Negative for fever, malaise/fatigue and weight loss.  HENT: Negative for congestion and sore throat.   Eyes:       Negative for visual changes  Respiratory: Negative for cough and shortness of breath.   Cardiovascular: Negative for chest pain, palpitations and leg swelling.    Gastrointestinal: Negative for blood in stool, constipation, diarrhea and heartburn.  Genitourinary: Negative for dysuria, frequency and urgency.  Musculoskeletal: Negative for falls, joint pain and myalgias.  Skin: Negative for rash.  Neurological: Negative for dizziness, sensory change and headaches.  Endo/Heme/Allergies: Does not bruise/bleed easily.  Psychiatric/Behavioral: Negative for depression, substance abuse and suicidal ideas. The patient is not nervous/anxious.     Objective:   Vitals:   08/23/16 1122  BP: 140/84  Pulse: (!) 58  Temp: 97.4 F (36.3 C)    Body mass index is 44.11 kg/m.   Physical Examination:  Physical Exam  Constitutional: She is oriented to person, place, and time and well-developed, well-nourished, and in no distress. No distress.  HENT:  Right Ear: External ear normal.  Left Ear: External ear normal.  Nose: Nose normal.  Mouth/Throat: Oropharynx is clear and moist.  No oropharyngeal exudate.  Eyes: Conjunctivae and EOM are normal. Pupils are equal, round, and reactive to light. No scleral icterus.  Neck: Normal range of motion. Neck supple. No thyromegaly present.  Cardiovascular: Normal rate, normal heart sounds and intact distal pulses.   Pulmonary/Chest: Effort normal and breath sounds normal. She exhibits no tenderness.  Abdominal: Soft. Bowel sounds are normal. She exhibits no distension. There is no tenderness.  Musculoskeletal: Normal range of motion. She exhibits no edema or tenderness.  Lymphadenopathy:    She has no cervical adenopathy.  Neurological: She is alert and oriented to person, place, and time.  Skin: Skin is warm and dry.  Psychiatric: Affect and judgment normal.    ASSESSMENT and PLAN:  Taytem was seen today for establish care.  Diagnoses and all orders for this visit:  Essential hypertension -     Comprehensive metabolic panel; Future -     triamterene-hydrochlorothiazide (MAXZIDE-25) 37.5-25 MG tablet; Take  0.5 tablets by mouth daily. -     losartan (COZAAR) 100 MG tablet; Take 1 tablet (100 mg total) by mouth daily.  Hypokalemia -     Comprehensive metabolic panel; Future  Mixed hyperlipidemia -     Lipid panel; Future -     simvastatin (ZOCOR) 20 MG tablet; Take 1 tablet (20 mg total) by mouth daily at 6 PM.  Fatigue, unspecified type -     CBC w/Diff; Future -     TSH; Future -     CK Total (and CKMB); Future -     Sed Rate (ESR); Future  Gastroesophageal reflux disease, esophagitis presence not specified -     esomeprazole (NEXIUM 24HR) 20 MG capsule; Take 1 capsule (20 mg total) by mouth daily.   No problem-specific Assessment & Plan notes found for this encounter.     Follow up: Return in about 3 months (around 11/22/2016) for HTN and , hyperlipidemia (fasting, need repeat lipid and BMP).  Wilfred Lacy, NP

## 2016-08-24 LAB — CK TOTAL AND CKMB (NOT AT ARMC): Total CK: 158 U/L (ref 7–177)

## 2016-09-04 ENCOUNTER — Telehealth: Payer: Self-pay | Admitting: Nurse Practitioner

## 2016-09-04 NOTE — Telephone Encounter (Signed)
ROI faxed to [facility]

## 2016-09-05 ENCOUNTER — Ambulatory Visit (INDEPENDENT_AMBULATORY_CARE_PROVIDER_SITE_OTHER): Payer: Medicare Other | Admitting: Specialist

## 2016-09-06 ENCOUNTER — Telehealth: Payer: Self-pay | Admitting: Nurse Practitioner

## 2016-09-06 NOTE — Telephone Encounter (Signed)
[facility]'d [facility]

## 2016-09-09 ENCOUNTER — Telehealth: Payer: Self-pay | Admitting: Nurse Practitioner

## 2016-09-09 NOTE — Telephone Encounter (Signed)
Rec'd from [facility] forward 42 pages to N[facility] Bonna Gainsharlotte Lum NP

## 2016-09-10 ENCOUNTER — Telehealth: Payer: Self-pay | Admitting: Nurse Practitioner

## 2016-09-10 ENCOUNTER — Encounter: Payer: Self-pay | Admitting: Nurse Practitioner

## 2016-09-10 NOTE — Progress Notes (Deleted)
Subjective:   [name] is a 71 y.o. female who presents for Medicare Annual (Subsequent) preventive examination.  Review of Systems:  No ROS.  Medicare Wellness Visit.    Sleep patterns: {SX; SLEEP PATTERNS:18802::"feels rested on waking","does not get up to void","gets up *** times nightly to void","sleeps *** hours nightly"}.   Home Safety/Smoke Alarms:   Living environment; residence and Firearm Safety: {Rehab home environment / accessibility:30080::"no firearms","firearms stored safely"}. Seat Belt Safety/Bike Helmet: Wears seat belt.   Counseling:   Eye Exam-  Dental-  Female:   Pap- N/A      Mammo-  Last 05/24/16,BI-RADS CATEGORY  1: Negative      Dexa scan- none       CCS- Last 05/16/10, normal, recall 10 years      Objective:     Vitals: There were no vitals taken for this visit.  There is no height or weight on file to calculate BMI.   Tobacco History  Smoking Status  . Never Smoker  Smokeless Tobacco  . Never Used     Counseling given: Not Answered   Past Medical History:  Diagnosis Date  . Arthritis   . Cataract of right eye   . Diastolic dysfunction   . Dizzy   . GERD (gastroesophageal reflux disease)   . Hyperlipidemia   . Hypertension   . Pancreatic cyst 02/2015   Recommend follow-up MRI in 12 months.   Past Surgical History:  Procedure Laterality Date  . ABDOMINAL HYSTERECTOMY    . ANTERIOR CERVICAL DECOMP/DISCECTOMY FUSION N/A 07/20/2012   Procedure: ANTERIOR CERVICAL DISCECTOMY FUSION C5-6, C6-7 with transgraft(Alphatec), local bone graft, plate and screws ;  Surgeon: Kerrin Champagne, MD;  Location: [facility];  Service: Orthopedics;  Laterality: N/A;  . BACK SURGERY    . COLONOSCOPY    . ESOPHAGOGASTRODUODENOSCOPY    . NECK SURGERY    . REPLACEMENT TOTAL KNEE BILATERAL    . THYROIDECTOMY, PARTIAL    . TRANSFORAMINAL LUMBAR INTERBODY FUSION (TLIF) WITH PEDICLE SCREW FIXATION 2 LEVEL N/A 01/23/2016   Procedure: TRANSFORAMINAL LUMBAR  INTERBODY FUSION (TLIF) WITH PEDICLE SCREW FIXATION 2 LEVEL WITH HARDWARE REMOVAL AND EXTENSION OF HARDWARE.;  Surgeon: Kerrin Champagne, MD;  Location: [facility];  Service: Orthopedics;  Laterality: N/A;  L1-L3    Family History  Problem Relation Age of Onset  . Ovarian cancer Mother   . Heart disease Mother   . Prostate cancer Father   . Heart disease Father   . Colon cancer Father   . Breast cancer Sister   . Prostate cancer Brother   . Cystic fibrosis Sister   . Heart disease Sister   . Pancreatic cancer Neg Hx   . Rectal cancer Neg Hx   . Stomach cancer Neg Hx    History  Sexual Activity  . Sexual activity: Not on file    Outpatient Encounter Prescriptions as of 09/11/2016  Medication Sig  . aspirin EC 81 MG tablet Take 81 mg by mouth daily.  [name] esomeprazole (NEXIUM 24HR) 20 MG capsule Take 1 capsule (20 mg total) by mouth daily.  [name] gabapentin (NEURONTIN) 300 MG capsule Take 1 capsule orally three times daily, office visit needed before next refill  . hyoscyamine (LEVSIN SL) 0.125 MG SL tablet Place 1 tablet (0.125 mg total) under the tongue every 6 (six) hours as needed.  [name] losartan (COZAAR) 100 MG tablet Take 1 tablet (100 mg total) by mouth daily.  . simvastatin (ZOCOR) 20  MG tablet Take 1 tablet (20 mg total) by mouth daily at 6 PM.  . triamterene-hydrochlorothiazide (MAXZIDE-25) 37.5-25 MG tablet Take 0.5 tablets by mouth daily.   No facility-administered encounter medications on file as of 09/11/2016.     Activities of Daily Living In your present state of health, do you have any difficulty performing the following activities: 01/23/2016 01/19/2016  Hearing? N N  Vision? N N  Difficulty concentrating or making decisions? N N  Walking or climbing stairs? N N  Dressing or bathing? N N  Doing errands, shopping? Malvin JohnsY Y  Some recent data might be hidden    Patient Care Team: Nche, Bonna Gainsharlotte Lum, NP as PCP - General (Internal Medicine)    Assessment:    Physical assessment  deferred to PCP.  Exercise Activities and Dietary recommendations   Diet (meal preparation, eat out, water intake, caffeinated beverages, dairy products, fruits and vegetables): {Desc; diets:16563} Breakfast: Lunch:  Dinner:      Goals    None     Fall Risk Fall Risk  08/23/2016  Falls in the past year? Yes  Number falls in past yr: 2 or more  Injury with Fall? No   Depression Screen PHQ 2/9 Scores 08/23/2016  PHQ - 2 Score 0     Cognitive Function        Immunization History  Administered Date(s) Administered  . Influenza,inj,Quad PF,36+ Mos 03/03/2015  . Pneumococcal Polysaccharide-23 06/03/2012, 01/25/2016  . Tdap 06/03/2012   Screening Tests Health Maintenance  Topic Date Due  . Hepatitis C Screening  01/29/1946  . TETANUS/TDAP  01/20/1966  . DEXA SCAN  01/21/2012  . INFLUENZA VACCINE  11/27/2016  . PNA vac Low Risk Adult (2 of 2 - PCV13) 01/24/2017  . MAMMOGRAM  05/24/2018  . COLONOSCOPY  05/16/2020      Plan:     I have personally reviewed and noted the following in the patient's chart:   . Medical and social history . Use of alcohol, tobacco or illicit drugs  . Current medications and supplements . Functional ability and status . Nutritional status . Physical activity . Advanced directives . List of other physicians . Vitals . Screenings to include cognitive, depression, and falls . Referrals and appointments  In addition, I have reviewed and discussed with patient certain preventive protocols, quality metrics, and best practice recommendations. A written personalized care plan for preventive services as well as general preventive health recommendations were provided to patient.     Wanda PlumpJill A Wine, RN  09/10/2016

## 2016-09-10 NOTE — Progress Notes (Deleted)
Pre visit review using our clinic review tool, if applicable. No additional management support is needed unless otherwise documented below in the visit note. 

## 2016-09-11 ENCOUNTER — Ambulatory Visit: Payer: Medicare Other

## 2016-09-25 ENCOUNTER — Ambulatory Visit (AMBULATORY_SURGERY_CENTER): Payer: Self-pay

## 2016-09-25 VITALS — Ht 64.0 in | Wt 261.2 lb

## 2016-09-25 DIAGNOSIS — R194 Change in bowel habit: Secondary | ICD-10-CM

## 2016-09-25 MED ORDER — NA SULFATE-K SULFATE-MG SULF 17.5-3.13-1.6 GM/177ML PO SOLN: 1.0000 | Freq: Once | ORAL | 0 refills | Status: AC

## 2016-09-25 NOTE — Progress Notes (Signed)
Denies allergies to eggs or soy products. Denies complication of anesthesia or sedation. Denies use of weight loss medication. Denies use of O2.   Emmi instructions given for colonoscopy.  

## 2016-09-30 ENCOUNTER — Encounter: Payer: Self-pay | Admitting: Gastroenterology

## 2016-10-08 ENCOUNTER — Telehealth: Payer: Self-pay | Admitting: Nurse Practitioner

## 2016-10-08 ENCOUNTER — Telehealth: Payer: Self-pay | Admitting: Gastroenterology

## 2016-10-08 ENCOUNTER — Emergency Department (HOSPITAL_COMMUNITY)
Admission: EM | Admit: 2016-10-08 | Discharge: 2016-10-08 | Disposition: A | Discharge status: Home / Self Care | Payer: Medicare Other | Attending: Emergency Medicine | Admitting: Emergency Medicine

## 2016-10-08 ENCOUNTER — Emergency Department (HOSPITAL_COMMUNITY): Payer: Medicare Other

## 2016-10-08 ENCOUNTER — Encounter (HOSPITAL_COMMUNITY): Payer: Self-pay | Admitting: *Deleted

## 2016-10-08 DIAGNOSIS — R1031 Right lower quadrant pain: Secondary | ICD-10-CM | POA: Insufficient documentation

## 2016-10-08 DIAGNOSIS — R531 Weakness: Secondary | ICD-10-CM

## 2016-10-08 DIAGNOSIS — Z7982 Long term (current) use of aspirin: Secondary | ICD-10-CM | POA: Diagnosis not present

## 2016-10-08 DIAGNOSIS — I10 Essential (primary) hypertension: Secondary | ICD-10-CM | POA: Insufficient documentation

## 2016-10-08 DIAGNOSIS — Z96653 Presence of artificial knee joint, bilateral: Secondary | ICD-10-CM | POA: Insufficient documentation

## 2016-10-08 DIAGNOSIS — Z79899 Other long term (current) drug therapy: Secondary | ICD-10-CM | POA: Insufficient documentation

## 2016-10-08 DIAGNOSIS — E871 Hypo-osmolality and hyponatremia: Secondary | ICD-10-CM | POA: Insufficient documentation

## 2016-10-08 LAB — CBC WITH DIFFERENTIAL/PLATELET
BASOS ABS: 0 10*3/uL (ref 0.0–0.1)
BASOS PCT: 0 %
EOS ABS: 0.1 10*3/uL (ref 0.0–0.7)
EOS PCT: 3 %
HCT: 35.2 % — ABNORMAL LOW (ref 36.0–46.0)
Hemoglobin: 11.6 g/dL — ABNORMAL LOW (ref 12.0–15.0)
LYMPHS PCT: 49 %
Lymphs Abs: 1.9 10*3/uL (ref 0.7–4.0)
MCH: 24.7 pg — ABNORMAL LOW (ref 26.0–34.0)
MCHC: 33 g/dL (ref 30.0–36.0)
MCV: 74.9 fL — AB (ref 78.0–100.0)
Monocytes Absolute: 0.3 10*3/uL (ref 0.1–1.0)
Monocytes Relative: 8 %
Neutro Abs: 1.5 10*3/uL — ABNORMAL LOW (ref 1.7–7.7)
Neutrophils Relative %: 40 %
PLATELETS: 243 10*3/uL (ref 150–400)
RBC: 4.7 MIL/uL (ref 3.87–5.11)
RDW: 15.1 % (ref 11.5–15.5)
WBC: 3.8 10*3/uL — ABNORMAL LOW (ref 4.0–10.5)

## 2016-10-08 LAB — COMPREHENSIVE METABOLIC PANEL
ALBUMIN: 3.6 g/dL (ref 3.5–5.0)
ALT: 7 U/L — ABNORMAL LOW (ref 14–54)
ANION GAP: 8 (ref 5–15)
AST: 17 U/L (ref 15–41)
Alkaline Phosphatase: 64 U/L (ref 38–126)
BILIRUBIN TOTAL: 0.4 mg/dL (ref 0.3–1.2)
BUN: 8 mg/dL (ref 6–20)
CO2: 26 mmol/L (ref 22–32)
Calcium: 9.2 mg/dL (ref 8.9–10.3)
Chloride: 93 mmol/L — ABNORMAL LOW (ref 101–111)
Creatinine, Ser: 1.08 mg/dL — ABNORMAL HIGH (ref 0.44–1.00)
GFR calc Af Amer: 59 mL/min — ABNORMAL LOW (ref >60)
GFR calc non Af Amer: 51 mL/min — ABNORMAL LOW (ref >60)
GLUCOSE: 107 mg/dL — AB (ref 65–99)
Potassium: 4 mmol/L (ref 3.5–5.1)
SODIUM: 127 mmol/L — AB (ref 135–145)
TOTAL PROTEIN: 6.9 g/dL (ref 6.5–8.1)

## 2016-10-08 LAB — I-STAT TROPONIN, ED: TROPONIN I, POC: 0 ng/mL (ref 0.00–0.08)

## 2016-10-08 LAB — URINALYSIS, ROUTINE W REFLEX MICROSCOPIC
Bilirubin Urine: NEGATIVE
GLUCOSE, UA: NEGATIVE mg/dL
Hgb urine dipstick: NEGATIVE
Ketones, ur: NEGATIVE mg/dL
LEUKOCYTES UA: NEGATIVE
Nitrite: NEGATIVE
PROTEIN: NEGATIVE mg/dL
Specific Gravity, Urine: 1.001 — ABNORMAL LOW (ref 1.005–1.030)
pH: 7 (ref 5.0–8.0)

## 2016-10-08 LAB — TSH: TSH: 2.489 u[IU]/mL (ref 0.350–4.500)

## 2016-10-08 MED ORDER — SODIUM CHLORIDE 0.9 % IV BOLUS (SEPSIS)
1000.0000 mL | Freq: Once | INTRAVENOUS | Status: AC
  Administered 2016-10-08: 1000 mL via INTRAVENOUS

## 2016-10-08 MED ORDER — IOPAMIDOL (ISOVUE-300) INJECTION 61%
INTRAVENOUS | Status: AC
  Administered 2016-10-08: 100 mL
  Filled 2016-10-08: qty 100

## 2016-10-08 NOTE — Telephone Encounter (Signed)
Patient canceled procedure for tomorrow 10/09/2016 due to falling at her sons house and now being at the hospital. Patient states she will call back when she can reschedule.

## 2016-10-08 NOTE — ED Provider Notes (Signed)
[facility]-EMERGENCY DEPT Provider Note   CSN: [id number] Arrival date & time: 10/08/16  0553     History   Chief Complaint Chief Complaint  Patient presents with  . Weakness    HPI [name] is a 71 y.o. female who presents with generalized weakness. PMH significant for chronic back pain, HTN/HLD, GERD, pancreatic cyst. She states that she's had a change in bowel habits for several months. She is mostly constipated and strains to pass her stool. Two days ago she started to "feel bad". She has been nauseous, had vomiting, decreased appetite, and frequent urination. She states that she is drinking a lot of fluids and within a couple of minutes of drinking she will have to go to the bathroom and will urinate a large volume. Yesterday she felt so weak that she called her son to pick her up. This morning she got up to get some water and her legs gave out due to weakness and she fell. She denies any serious injury from this. She denies fever, chills, headache, LOC, chest pain, SOB, diarrhea, dysuria. She reports that she is scheduled for a colonoscopy tomorrow but feels too weak to do the prep. Past surgical hx significant for thyroidectomy, hysterectomy. Has had colonoscopy/EGD by Dr. Christella Hartigan in the past. Of note she was admitted for syncopal work up in 2016 and it was recommended her Maxzide was adjusted as well as Holter monitoring - neither of which has been done.  HPI  Past Medical History:  Diagnosis Date  . Allergy   . Arthritis   . Cataract of right eye   . Diastolic dysfunction   . Dizzy   . GERD (gastroesophageal reflux disease)   . Hyperlipidemia   . Hypertension   . Pancreatic cyst 02/2015   Recommend follow-up MRI in 12 months.    Patient Active Problem List   Diagnosis Date Noted  . S/P partial thyroidectomy 08/23/2016  . Spinal stenosis, lumbar region, with neurogenic claudication 01/23/2016    Class: Chronic  . Other secondary scoliosis, 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   Class: Chronic  . Spondylolisthesis of 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  . Diastolic dysfunction   . Hypokalemia 03/08/2015  . Bradycardia 03/08/2015  . Nausea and vomiting 03/03/2015  . Syncope and collapse 03/02/2015  . Pancreatic cyst 02/28/2015  . HNP (herniated nucleus pulposus), cervical 07/20/2012    Class: Acute  . Cervical spondylosis without myelopathy 07/20/2012    Class: Chronic  . Venous (peripheral) insufficiency 06/08/2010  . Asymptomatic varicose veins 06/07/2010  . HYPERLIPIDEMIA 04/16/2010  . Essential hypertension 04/16/2010  . GERD 04/16/2010  . HIATAL HERNIA 04/16/2010  . OSTEOARTHRITIS 04/16/2010  . DYSPHAGIA UNSPECIFIED 04/16/2010  . Atrophic vaginitis 03/31/2009    Past Surgical History:  Procedure Laterality Date  . ABDOMINAL HYSTERECTOMY    . ANTERIOR CERVICAL DECOMP/DISCECTOMY FUSION N/A 07/20/2012   Procedure: ANTERIOR CERVICAL DISCECTOMY FUSION C5-6, C6-7 with transgraft(Alphatec), local bone graft, plate and screws ;  Surgeon: Kerrin Champagne, MD;  Location: [facility] OR;  Service: Orthopedics;  Laterality: N/A;  . BACK SURGERY    . COLONOSCOPY    . ESOPHAGOGASTRODUODENOSCOPY    . NECK SURGERY    . REPLACEMENT TOTAL KNEE BILATERAL    . THYROIDECTOMY, PARTIAL    . TRANSFORAMINAL LUMBAR INTERBODY FUSION (TLIF) WITH PEDICLE SCREW FIXATION 2 LEVEL N/A 01/23/2016   Procedure: TRANSFORAMINAL LUMBAR INTERBODY FUSION (TLIF) WITH PEDICLE SCREW FIXATION 2 LEVEL WITH HARDWARE REMOVAL AND EXTENSION OF HARDWARE.;  Surgeon: Kerrin Champagne, MD;  Location: [facility] OR;  Service: Orthopedics;  Laterality: N/A;  L1-L3     OB History    No data available       Home Medications    Prior to Admission medications   Medication Sig Start Date End Date Taking? Authorizing Provider  aspirin EC 81 MG tablet Take 81 mg by mouth daily.    [provider]  esomeprazole (NEXIUM 24HR) 20 MG capsule Take 1 capsule (20 mg total) by mouth daily. 08/23/16   Nche, Bonna Gains, NP    gabapentin (NEURONTIN) 300 MG capsule Take 1 capsule orally three times daily, office visit needed before next refill 07/01/16   Andrena Mews, DO  losartan (COZAAR) 100 MG tablet Take 1 tablet (100 mg total) by mouth daily. 08/23/16 12/01/21  Nche, Bonna Gains, NP  simvastatin (ZOCOR) 20 MG tablet Take 1 tablet (20 mg total) by mouth daily at 6 PM. 08/23/16   Nche, Bonna Gains, NP  triamterene-hydrochlorothiazide (MAXZIDE-25) 37.5-25 MG tablet Take 0.5 tablets by mouth daily. 08/23/16   Nche, Bonna Gains, NP    Family History Family History  Problem Relation Age of Onset  . Ovarian cancer Mother   . Heart disease Mother   . Prostate cancer Father   . Heart disease Father   . Colon cancer Father   . Breast cancer Sister   . Prostate cancer Brother   . Cystic fibrosis Sister   . Heart disease Sister   . Pancreatic cancer Neg Hx   . Rectal cancer Neg Hx   . Stomach cancer Neg Hx     Social History Social History  Substance Use Topics  . Smoking status: Never Smoker  . Smokeless tobacco: Never Used  . Alcohol use No     Allergies   Lisinopril; Penicillins; and Tramadol hcl   Review of Systems Review of Systems  Constitutional: Positive for appetite change. Negative for chills and fever.  Respiratory: Negative for shortness of breath.   Cardiovascular: Negative for chest pain.  Gastrointestinal: Positive for abdominal pain, constipation, nausea and vomiting. Negative for diarrhea.  Genitourinary: Positive for frequency. Negative for dysuria.  Neurological: Positive for weakness (generalized). Negative for syncope, light-headedness and headaches.     Physical Exam Updated Vital Signs BP (!) 147/88 (BP Location: Left Wrist)   Pulse 66   Temp 97.9 F (36.6 C) (Oral)   Resp 18   Ht 5\' 4"  (1.626 m)   Wt 118.4 kg (261 lb)   SpO2 98%   BMI 44.80 kg/m   Physical Exam  Constitutional: She is oriented to person, place, and time. She appears well-developed and  well-nourished. No distress.  HENT:  Head: Normocephalic and atraumatic.  Eyes: Conjunctivae are normal. Pupils are equal, round, and reactive to light. Right eye exhibits no discharge. Left eye exhibits no discharge. No scleral icterus.  Neck: Normal range of motion.  Cardiovascular: Normal rate and regular rhythm.  Exam reveals no gallop and no friction rub.   No murmur heard. Pulmonary/Chest: Effort normal and breath sounds normal. No respiratory distress. She has no wheezes. She has no rales. She exhibits no tenderness.  Abdominal: Soft. Bowel sounds are normal. She exhibits no distension and no mass. There is tenderness (RLQ tenderness). There is no rebound and no guarding. No hernia.  Musculoskeletal:  5/5 strength bilaterally in legs  Neurological: She is alert and oriented to person, place, and time.  Skin: Skin is warm and dry.  Psychiatric: She has a normal mood and  affect. Her behavior is normal.  Nursing note and vitals reviewed.    ED Treatments / Results  Labs (all labs ordered are listed, but only abnormal results are displayed) Labs Reviewed  COMPREHENSIVE METABOLIC PANEL - Abnormal; Notable for the following:       Result Value   Sodium 127 (*)    Chloride 93 (*)    Glucose, Bld 107 (*)    Creatinine, Ser 1.08 (*)    ALT 7 (*)    GFR calc non Af Amer 51 (*)    GFR calc Af Amer 59 (*)    All other components within normal limits  URINALYSIS, ROUTINE W REFLEX MICROSCOPIC - Abnormal; Notable for the following:    Color, Urine STRAW (*)    APPearance HAZY (*)    Specific Gravity, Urine 1.001 (*)    All other components within normal limits  CBC WITH DIFFERENTIAL/PLATELET - Abnormal; Notable for the following:    WBC 3.8 (*)    Hemoglobin 11.6 (*)    HCT 35.2 (*)    MCV 74.9 (*)    MCH 24.7 (*)    Neutro Abs 1.5 (*)    All other components within normal limits  TSH  I-STAT TROPOININ, ED    EKG  EKG Interpretation  Date/Time:  Tuesday October 08 2016  07:38:12 EDT Ventricular Rate:  55 PR Interval:    QRS Duration: 91 QT Interval:  450 QTC Calculation: 431 R Axis:   -16 Text Interpretation:  Sinus rhythm Borderline left axis deviation No significant change since last tracing Confirmed by Drema Pry [phone]) on 10/08/2016 7:46:18 AM       Radiology Ct Abdomen Pelvis W Contrast  Result Date: 10/08/2016 CLINICAL DATA:  Right lower quadrant pain for 2 days EXAM: CT ABDOMEN AND PELVIS WITH CONTRAST TECHNIQUE: Multidetector CT imaging of the abdomen and pelvis was performed using the standard protocol following bolus administration of intravenous contrast. CONTRAST:  ISOVUE-300 IOPAMIDOL (ISOVUE-300) INJECTION 61% COMPARISON:  None. FINDINGS: Lower chest: Dependent atelectasis. Hepatobiliary: Mild diffuse hepatic steatosis.  Normal gallbladder. Pancreas: Unremarkable Spleen: Unremarkable Adrenals/Urinary Tract: Adrenal glands are unremarkable. Small low-density lesions in the kidneys are nonspecific. Stomach/Bowel: The appendix is unremarkable. Ascending colon is relatively decompressed. There is no evidence of small-bowel obstruction. No obvious mass in the colon. Vascular/Lymphatic: Mild vascular calcifications in the aorta and iliac arterial system. No evidence of retroperitoneal adenopathy. Reproductive: Uterus is absent. Adnexa are unremarkable. Bladder is distended. Other: No free-fluid. Musculoskeletal: Anterolisthesis L5 upon S1 is present. Posterior elements of L5 and S1 are fused. There is hardware present within L1 through L5. Left pedicle screw in L5 has been removed. The right L5 pedicle screw remains in place. L5-S1 fusion is also present across the disc space and posterior elements. There is no vertebral compression deformity. No obvious breakage of the hardware. IMPRESSION: No acute intra- abdominal pathology Bladder distention. Electronically Signed   By: Jolaine Click M.D.   On: 10/08/2016 08:40    Procedures Procedures  (including critical care time)  Medications Ordered in ED Medications  sodium chloride 0.9 % bolus 1,000 mL (1,000 mLs Intravenous New Bag/Given 10/08/16 0904)  iopamidol (ISOVUE-300) 61 % injection (100 mLs  Contrast Given 10/08/16 0816)     Initial Impression / Assessment and Plan / ED Course  I have reviewed the triage vital signs and the nursing notes.  Pertinent labs & imaging results that were available during my care of the patient were reviewed by me  and considered in my medical decision making (see chart for details).  71 year old with generalized weakness and hyponatremia. Vitals are normal. CBC remarkable for mild leukopenia and anemia. CMP remarkable for hyponatremia of 127, hypochloremia. Trop is 0. EKG is sinus bradycardia. TSH is normal. CT of abdomen and pelvis is negative. Will give fluids and walk patient and reassess.  Pt was able to walk - per nursing. She is requesting d/c. Advised her to follow up with her PCP regarding her BP meds which may be causing hyponatremia. She verbalized understanding.  Final Clinical Impressions(s) / ED Diagnoses   Final diagnoses:  Generalized weakness  Hyponatremia  Right lower quadrant abdominal pain    New Prescriptions New Prescriptions   No medications on file     Beryle QuantGekas, Kelly Marie, PA-C 10/08/16 1626    Pricilla LovelessGoldston, Scott, MD 10/09/16 [phone]

## 2016-10-08 NOTE — Telephone Encounter (Signed)
Ok, thanks, no charge.  Can you offer the spot to anyone on my [facility] waitlist.  thanks

## 2016-10-08 NOTE — ED Notes (Signed)
Patient transported to CT 

## 2016-10-08 NOTE — Discharge Instructions (Signed)
Please follow up with your doctor to possibly have your blood pressure medicine adjusted Return for worsening symptoms

## 2016-10-08 NOTE — ED Triage Notes (Signed)
Pt c/o generalized weakness since onset last night. Pt reports NV since Sunday with RLQ pain. Pt also had a fall this morning after having weakness in her legs

## 2016-10-09 ENCOUNTER — Encounter: Payer: Medicare Other | Admitting: Gastroenterology

## 2016-10-09 ENCOUNTER — Telehealth: Payer: Self-pay | Admitting: Nurse Practitioner

## 2016-10-09 NOTE — Telephone Encounter (Signed)
Stop maxzide. Continue losartan. Make follow up appt within 1week to be re evalauated.

## 2016-10-09 NOTE — Telephone Encounter (Signed)
Left message for patient to call back.  Wanted to let her know we can do a one time billing on copay for upcoming appt.

## 2016-10-09 NOTE — Telephone Encounter (Signed)
Please advise 

## 2016-10-09 NOTE — Telephone Encounter (Signed)
That would be fine, patient is in good standing at this office. Please let her know.

## 2016-10-09 NOTE — Telephone Encounter (Signed)
Pt verbalized undertand to stop maxide and still take losartan. Pt has appt with [name] to re eval BP on 10/17/2016 but she doesn't have the money for copay. Can we bill her for this appt? Please call pt it this is ok.

## 2016-10-09 NOTE — Telephone Encounter (Signed)
Pt was seen in ER yesterday, she was told her Potassium and Sodim were low because of her BP medicine, they told her she needs a  New BP without a diuretic in it. Please advise. She did not take her BP meds yesterday or today. Please call back

## 2016-10-10 ENCOUNTER — Emergency Department (HOSPITAL_COMMUNITY): Payer: Medicare Other

## 2016-10-10 ENCOUNTER — Telehealth (INDEPENDENT_AMBULATORY_CARE_PROVIDER_SITE_OTHER): Payer: Self-pay | Admitting: Orthopaedic Surgery

## 2016-10-10 ENCOUNTER — Telehealth: Payer: Self-pay | Admitting: Gastroenterology

## 2016-10-10 ENCOUNTER — Encounter (HOSPITAL_COMMUNITY): Payer: Self-pay | Admitting: Emergency Medicine

## 2016-10-10 ENCOUNTER — Emergency Department (HOSPITAL_COMMUNITY)
Admission: EM | Admit: 2016-10-10 | Discharge: 2016-10-10 | Disposition: A | Discharge status: Home / Self Care | Payer: Medicare Other | Attending: Emergency Medicine | Admitting: Emergency Medicine

## 2016-10-10 DIAGNOSIS — R1013 Epigastric pain: Secondary | ICD-10-CM | POA: Diagnosis not present

## 2016-10-10 DIAGNOSIS — Z79899 Other long term (current) drug therapy: Secondary | ICD-10-CM | POA: Diagnosis not present

## 2016-10-10 DIAGNOSIS — R079 Chest pain, unspecified: Secondary | ICD-10-CM | POA: Diagnosis not present

## 2016-10-10 DIAGNOSIS — R531 Weakness: Secondary | ICD-10-CM | POA: Diagnosis not present

## 2016-10-10 DIAGNOSIS — Z96653 Presence of artificial knee joint, bilateral: Secondary | ICD-10-CM | POA: Diagnosis not present

## 2016-10-10 DIAGNOSIS — I10 Essential (primary) hypertension: Secondary | ICD-10-CM | POA: Insufficient documentation

## 2016-10-10 LAB — CBC WITH DIFFERENTIAL/PLATELET
BASOS PCT: 1 %
Basophils Absolute: 0 10*3/uL (ref 0.0–0.1)
Eosinophils Absolute: 0.1 10*3/uL (ref 0.0–0.7)
Eosinophils Relative: 1 %
HEMATOCRIT: 35 % — AB (ref 36.0–46.0)
HEMOGLOBIN: 11.7 g/dL — AB (ref 12.0–15.0)
LYMPHS ABS: 1.3 10*3/uL (ref 0.7–4.0)
Lymphocytes Relative: 30 %
MCH: 25.3 pg — AB (ref 26.0–34.0)
MCHC: 33.4 g/dL (ref 30.0–36.0)
MCV: 75.8 fL — AB (ref 78.0–100.0)
MONO ABS: 0.5 10*3/uL (ref 0.1–1.0)
Monocytes Relative: 13 %
NEUTROS ABS: 2.3 10*3/uL (ref 1.7–7.7)
Neutrophils Relative %: 55 %
Platelets: 242 10*3/uL (ref 150–400)
RBC: 4.62 MIL/uL (ref 3.87–5.11)
RDW: 15.7 % — AB (ref 11.5–15.5)
WBC: 4.2 10*3/uL (ref 4.0–10.5)

## 2016-10-10 LAB — COMPREHENSIVE METABOLIC PANEL
ALBUMIN: 4.1 g/dL (ref 3.5–5.0)
ALK PHOS: 60 U/L (ref 38–126)
ALT: 8 U/L — ABNORMAL LOW (ref 14–54)
AST: 20 U/L (ref 15–41)
Anion gap: 10 (ref 5–15)
BILIRUBIN TOTAL: 0.5 mg/dL (ref 0.3–1.2)
BUN: 10 mg/dL (ref 6–20)
CO2: 27 mmol/L (ref 22–32)
Calcium: 9.2 mg/dL (ref 8.9–10.3)
Chloride: 98 mmol/L — ABNORMAL LOW (ref 101–111)
Creatinine, Ser: 1.11 mg/dL — ABNORMAL HIGH (ref 0.44–1.00)
GFR calc Af Amer: 57 mL/min — ABNORMAL LOW (ref >60)
GFR calc non Af Amer: 49 mL/min — ABNORMAL LOW (ref >60)
GLUCOSE: 100 mg/dL — AB (ref 65–99)
POTASSIUM: 3.9 mmol/L (ref 3.5–5.1)
SODIUM: 135 mmol/L (ref 135–145)
TOTAL PROTEIN: 7.3 g/dL (ref 6.5–8.1)

## 2016-10-10 LAB — URINALYSIS, ROUTINE W REFLEX MICROSCOPIC
Bilirubin Urine: NEGATIVE
GLUCOSE, UA: NEGATIVE mg/dL
HGB URINE DIPSTICK: NEGATIVE
Ketones, ur: NEGATIVE mg/dL
LEUKOCYTES UA: NEGATIVE
Nitrite: NEGATIVE
PH: 7 (ref 5.0–8.0)
Protein, ur: NEGATIVE mg/dL

## 2016-10-10 LAB — I-STAT TROPONIN, ED: Troponin i, poc: 0 ng/mL (ref 0.00–0.08)

## 2016-10-10 LAB — LIPASE, BLOOD: Lipase: 67 U/L — ABNORMAL HIGH (ref 11–51)

## 2016-10-10 MED ORDER — SUCRALFATE 1 GM/10ML PO SUSP: 1.0000 g | Freq: Three times a day (TID) | ORAL | 0 refills | Status: DC

## 2016-10-10 MED ORDER — OMEPRAZOLE 20 MG PO CPDR: 20.0000 mg | DELAYED_RELEASE_CAPSULE | Freq: Every day | ORAL | 0 refills | Status: DC

## 2016-10-10 MED ORDER — GI COCKTAIL ~~LOC~~
30.0000 mL | Freq: Once | ORAL | Status: AC
  Administered 2016-10-10: 30 mL via ORAL
  Filled 2016-10-10: qty 30

## 2016-10-10 MED ORDER — SODIUM CHLORIDE 0.9 % IV BOLUS (SEPSIS)
500.0000 mL | Freq: Once | INTRAVENOUS | Status: AC
  Administered 2016-10-10: 500 mL via INTRAVENOUS

## 2016-10-10 NOTE — Discharge Instructions (Signed)
We believe your symptoms are a result of GERD (acid reflux).  Please read through the included information and follow up with your regular doctor.  In the meantime, we encourage you to try an over-the-counter medication such as Prilosec OTC.  Give it at least a week at see if your symptoms improve. ° °Return to the Emergency Department with new or worsening symptoms that concern you. ° ° °Heartburn °Heartburn is a type of pain or discomfort that can happen in the throat or chest. It is often described as a burning pain. It may also cause a bad taste in the mouth. Heartburn may feel worse when you lie down or bend over, and it is often worse at night. Heartburn may be caused by stomach contents that move back up into the esophagus (reflux). °HOME CARE INSTRUCTIONS °Take these actions to decrease your discomfort and to help avoid complications. °Diet °Follow a diet as recommended by your health care provider. This may involve avoiding foods and drinks such as: °Coffee and tea (with or without caffeine). °Drinks that contain alcohol. °Energy drinks and sports drinks. °Carbonated drinks or sodas. °Chocolate and cocoa. °Peppermint and mint flavorings. °Garlic and onions. °Horseradish. °Spicy and acidic foods, including peppers, chili powder, curry powder, vinegar, hot sauces, and barbecue sauce. °Citrus fruit juices and citrus fruits, such as oranges, lemons, and limes. °Tomato-based foods, such as red sauce, chili, salsa, and pizza with red sauce. °Fried and fatty foods, such as donuts, french fries, potato chips, and high-fat dressings. °High-fat meats, such as hot dogs and fatty cuts of red and white meats, such as rib eye steak, sausage, ham, and bacon. °High-fat dairy items, such as whole milk, butter, and cream cheese. °Eat small, frequent meals instead of large meals. °Avoid drinking large amounts of liquid with your meals. °Avoid eating meals during the 2-3 hours before bedtime. °Avoid lying down right after you  eat. °Do not exercise right after you eat. ° General Instructions  °Pay attention to any changes in your symptoms. °Take over-the-counter and prescription medicines only as told by your health care provider. Do not take aspirin, ibuprofen, or other NSAIDs unless your health care provider told you to do so. °Do not use any tobacco products, including cigarettes, chewing tobacco, and e-cigarettes. If you need help quitting, ask your health care provider. °Wear loose-fitting clothing. Do not wear anything tight around your waist that causes pressure on your abdomen. °Raise (elevate) the head of your bed about 6 inches (15 cm). °Try to reduce your stress, such as with yoga or meditation. If you need help reducing stress, ask your health care provider. °If you are overweight, reduce your weight to an amount that is healthy for you. Ask your health care provider for guidance about a safe weight loss goal. °Keep all follow-up visits as told by your health care provider. This is important. °SEEK MEDICAL CARE IF: °You have new symptoms. °You have unexplained weight loss. °You have difficulty swallowing, or it hurts to swallow. °You have wheezing or a persistent cough. °Your symptoms do not improve with treatment. °You have frequent heartburn for more than two weeks. °SEEK IMMEDIATE MEDICAL CARE IF: °You have pain in your arms, neck, jaw, teeth, or back. °You feel sweaty, dizzy, or light-headed. °You have chest pain or shortness of breath. °You vomit and your vomit looks like blood or coffee grounds. °Your stool is bloody or black. °  °This information is not intended to replace advice given to you by your health   care provider. Make sure you discuss any questions you have with your health care provider. °  °Document Released: 09/01/2008 Document Revised: 01/04/2015 Document Reviewed: 08/10/2014 °Elsevier Interactive Patient Education ©2016 [employer] ° °Food Choices for Gastroesophageal Reflux Disease, Adult °When you have  gastroesophageal reflux disease (GERD), the foods you eat and your eating habits are very important. Choosing the right foods can help ease the discomfort of GERD. °WHAT GENERAL GUIDELINES DO I NEED TO FOLLOW? °Choose fruits, vegetables, whole grains, low-fat dairy products, and low-fat meat, fish, and poultry. °Limit fats such as oils, salad dressings, butter, nuts, and avocado. °Keep a food diary to identify foods that cause symptoms. °Avoid foods that cause reflux. These may be different for different people. °Eat frequent small meals instead of three large meals each day. °Eat your meals slowly, in a relaxed setting. °Limit fried foods. °Cook foods using methods other than frying. °Avoid drinking alcohol. °Avoid drinking large amounts of liquids with your meals. °Avoid bending over or lying down until 2-3 hours after eating. °WHAT FOODS ARE NOT RECOMMENDED? °The following are some foods and drinks that may worsen your symptoms: °Vegetables °Tomatoes. Tomato juice. Tomato and spaghetti sauce. Chili peppers. Onion and garlic. Horseradish. °Fruits °Oranges, grapefruit, and lemon (fruit and juice). °Meats °High-fat meats, fish, and poultry. This includes hot dogs, ribs, ham, sausage, salami, and bacon. °Dairy °Whole milk and chocolate milk. Sour cream. Cream. Butter. Ice cream. Cream cheese.  °Beverages °Coffee and tea, with or without caffeine. Carbonated beverages or energy drinks. °Condiments °Hot sauce. Barbecue sauce.  °Sweets/Desserts °Chocolate and cocoa. Donuts. Peppermint and spearmint. °Fats and Oils °High-fat foods, including French fries and potato chips. °Other °Vinegar. Strong spices, such as black pepper, white pepper, red pepper, cayenne, curry powder, cloves, ginger, and chili powder. °The items listed above may not be a complete list of foods and beverages to avoid. Contact your dietitian for more information. °  °This information is not intended to replace advice given to you by your health care  provider. Make sure you discuss any questions you have with your health care provider. °  °Document Released: 04/15/2005 Document Revised: 05/06/2014 Document Reviewed: 02/17/2013 °Elsevier Interactive Patient Education ©2016 [employer] ° ° °

## 2016-10-10 NOTE — Telephone Encounter (Signed)
Noted, thanks!

## 2016-10-10 NOTE — Telephone Encounter (Signed)
Will forward to Dr Christella HartiganJacobs for review

## 2016-10-10 NOTE — Telephone Encounter (Signed)
Patient called wanting Dr Otelia SergeantNitka to know that she went to the ER Tuesday and again today. Patient said she fell Tuesday morning. Patient state her legs gave way and she fell. Patient said she will not have her co-pay and need to keep her appointment on 10/17/16. The number to contact patient is [phone]

## 2016-10-10 NOTE — Telephone Encounter (Signed)
Patient called wanting Dr Otelia SergeantNitka to know that she went to the ER Tuesday and again today. Patient said she fell Tuesday morning. Patient state her legs gave way and she fell. Patient said she will not have her co-pay and need to keep her appointment on 10/17/16.

## 2016-10-10 NOTE — Telephone Encounter (Signed)
The pt was rescheduled to 8/31 at 8 am because she wanted an earlier time.  She was re instructed and she verbalized understanding

## 2016-10-10 NOTE — ED Provider Notes (Signed)
Emergency Department Provider Note   I have reviewed the triage vital signs and the nursing notes.   HISTORY  Chief Complaint Emesis; Diarrhea; and Chest Pain   HPI [name] is a 71 y.o. female presents to the emergency department for evaluation of worsening generalized weakness with some associated urinary frequency, dry mouth, vomiting yesterday, and chest/epigastric pain. The patient has not been taking her Nexium. She was seen in the emergency department 2 days ago with similar symptoms and found to be hyponatremic. She was able to ambulate and hyponatremia was thought to be due to medication. She was discharged home and has discontinued some of her blood pressure medication but continues to feel weak and feels like its worsening. She also developed some chest discomfort/epigastric abdominal discomfort. No dyspnea. No fevers or chills. No new abdominal pain. Denies dysuria. No radiation of symptoms. No unilateral weakness or numbness.  Past Medical History:  Diagnosis Date  . Allergy   . Arthritis   . Cataract of right eye   . Diastolic dysfunction   . Dizzy   . GERD (gastroesophageal reflux disease)   . Hyperlipidemia   . Hypertension   . Pancreatic cyst 02/2015   Recommend follow-up MRI in 12 months.    Patient Active Problem List   Diagnosis Date Noted  . S/P partial thyroidectomy 08/23/2016  . Spinal stenosis, lumbar region, with neurogenic claudication 01/23/2016    Class: Chronic  . Other secondary scoliosis, 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    Class: Chronic  . Spondylolisthesis of 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  . Diastolic dysfunction   . Hypokalemia 03/08/2015  . Bradycardia 03/08/2015  . Nausea and vomiting 03/03/2015  . Syncope and collapse 03/02/2015  . Pancreatic cyst 02/28/2015  . HNP (herniated nucleus pulposus), cervical 07/20/2012    Class: Acute  . Cervical spondylosis without myelopathy 07/20/2012    Class: Chronic  . Venous (peripheral)  insufficiency 06/08/2010  . Asymptomatic varicose veins 06/07/2010  . HYPERLIPIDEMIA 04/16/2010  . Essential hypertension 04/16/2010  . GERD 04/16/2010  . HIATAL HERNIA 04/16/2010  . OSTEOARTHRITIS 04/16/2010  . DYSPHAGIA UNSPECIFIED 04/16/2010  . Atrophic vaginitis 03/31/2009    Past Surgical History:  Procedure Laterality Date  . ABDOMINAL HYSTERECTOMY    . ANTERIOR CERVICAL DECOMP/DISCECTOMY FUSION N/A 07/20/2012   Procedure: ANTERIOR CERVICAL DISCECTOMY FUSION C5-6, C6-7 with transgraft(Alphatec), local bone graft, plate and screws ;  Surgeon: Kerrin Champagne, MD;  Location: [facility] OR;  Service: Orthopedics;  Laterality: N/A;  . BACK SURGERY    . COLONOSCOPY    . ESOPHAGOGASTRODUODENOSCOPY    . NECK SURGERY    . REPLACEMENT TOTAL KNEE BILATERAL    . THYROIDECTOMY, PARTIAL    . TRANSFORAMINAL LUMBAR INTERBODY FUSION (TLIF) WITH PEDICLE SCREW FIXATION 2 LEVEL N/A 01/23/2016   Procedure: TRANSFORAMINAL LUMBAR INTERBODY FUSION (TLIF) WITH PEDICLE SCREW FIXATION 2 LEVEL WITH HARDWARE REMOVAL AND EXTENSION OF HARDWARE.;  Surgeon: Kerrin Champagne, MD;  Location: [facility] OR;  Service: Orthopedics;  Laterality: N/A;  L1-L3     Current Outpatient Rx  . Order #: [id number] Class: Historical Med  . Order #: [id number] Class: Normal  . Order #: [id number] Class: Normal  . Order #: [id number] Class: Historical Med  . Order #: [id number] Class: Historical Med  . Order #: [id number] Class: Normal  . Order #: [id number] Class: Historical Med  . Order #: [id number] Class: Normal  . Order #: [id number] Class: Normal  . Order #: [id number] Class: Print  . Order #: [id number] Class: Print    Allergies Lisinopril; Penicillins; and Tramadol  hcl  Family History  Problem Relation Age of Onset  . Ovarian cancer Mother   . Heart disease Mother   . Prostate cancer Father   . Heart disease Father   . Colon cancer Father   . Breast cancer Sister   . Prostate cancer Brother   . Cystic fibrosis Sister   . Heart disease Sister    . Pancreatic cancer Neg Hx   . Rectal cancer Neg Hx   . Stomach cancer Neg Hx     Social History Social History  Substance Use Topics  . Smoking status: Never Smoker  . Smokeless tobacco: Never Used  . Alcohol use No    Review of Systems  Constitutional: No fever/chills. Positive fatigue.  Eyes: No visual changes. ENT: No sore throat. Cardiovascular: Positive chest pain. Respiratory: Denies shortness of breath. Gastrointestinal: Positive epigastric abdominal pain. Positive nausea and vomiting.  No diarrhea.  No constipation. Genitourinary: Negative for dysuria. Positive urinary frequency.  Musculoskeletal: Negative for back pain. Skin: Negative for rash. Neurological: Negative for headaches, focal weakness or numbness.  10-point ROS otherwise negative.  ____________________________________________   PHYSICAL EXAM:  VITAL SIGNS: ED Triage Vitals  Enc Vitals Group     BP 10/10/16 0916 140/75     Pulse Rate 10/10/16 0916 74     Resp 10/10/16 0916 16     Temp 10/10/16 0916 98.5 F (36.9 C)     Temp src --      SpO2 10/10/16 0916 100 %     Weight 10/10/16 0916 260 lb (117.9 kg)     Height 10/10/16 0916 5\' 4"  (1.626 m)     Pain Score 10/10/16 0909 7   Constitutional: Alert and oriented. Well appearing and in no acute distress. Eyes: Conjunctivae are normal.  Head: Atraumatic. Nose: No congestion/rhinnorhea. Mouth/Throat: Mucous membranes are dry. Neck: No stridor. Cardiovascular: Normal rate, regular rhythm. Good peripheral circulation. Grossly normal heart sounds.   Respiratory: Normal respiratory effort.  No retractions. Lungs CTAB. Gastrointestinal: Soft with epigastric abdominal discomfort. No distention.  Musculoskeletal: No lower extremity tenderness nor edema. No gross deformities of extremities. Neurologic:  Normal speech and language. No gross focal neurologic deficits are appreciated.  Skin:  Skin is warm, dry and intact. No rash  noted.  ____________________________________________   LABS (all labs ordered are listed, but only abnormal results are displayed)  Labs Reviewed  COMPREHENSIVE METABOLIC PANEL - Abnormal; Notable for the following:       Result Value   Chloride 98 (*)    Glucose, Bld 100 (*)    Creatinine, Ser 1.11 (*)    ALT 8 (*)    GFR calc non Af Amer 49 (*)    GFR calc Af Amer 57 (*)    All other components within normal limits  LIPASE, BLOOD - Abnormal; Notable for the following:    Lipase 67 (*)    All other components within normal limits  CBC WITH DIFFERENTIAL/PLATELET - Abnormal; Notable for the following:    Hemoglobin 11.7 (*)    HCT 35.0 (*)    MCV 75.8 (*)    MCH 25.3 (*)    RDW 15.7 (*)    All other components within normal limits  URINALYSIS, ROUTINE W REFLEX MICROSCOPIC - Abnormal; Notable for the following:    Specific Gravity, Urine <1.005 (*)    All other components within normal limits  I-STAT TROPOININ, ED   ____________________________________________  RADIOLOGY  Dg Chest 2 View  Result Date: 10/10/2016  CLINICAL DATA:  71 year old female with nausea, vomiting and diarrhea for the past 2-3 days with new mid chest pain. Fullness and burning sensation in the chest since yesterday. EXAM: CHEST  2 VIEW COMPARISON:  Chest x-ray 03/02/2015. FINDINGS: Lung volumes are normal. No consolidative airspace disease. No pleural effusions. No pneumothorax. No pulmonary nodule or mass noted. Pulmonary vasculature and the cardiomediastinal silhouette are within normal limits. Atherosclerosis in the thoracic aorta. Orthopedic fixation hardware in the lower cervical spine and lumbar spine, incompletely visualized. IMPRESSION: 1. No radiographic evidence of acute cardiopulmonary disease. 2. Aortic atherosclerosis. Electronically Signed   By: Trudie Reedaniel  Entrikin M.D.   On: 10/10/2016 10:01    ____________________________________________  EKG:    EKG Interpretation  Date/Time:  Thursday  October 10 2016 09:16:51 EDT Ventricular Rate:  75 PR Interval:    QRS Duration: 91 QT Interval:  410 QTC Calculation: 458 R Axis:   -33 Text Interpretation:  Sinus rhythm Left axis deviation Borderline low voltage, extremity leads No STEMI.  Confirmed by Alona BeneLong, Joshua [phone]([id number]) on 10/10/2016 9:22:59 AM       ____________________________________________   PROCEDURES  Procedure(s) performed:   Procedures  None ____________________________________________   INITIAL IMPRESSION / ASSESSMENT AND PLAN / ED COURSE  Pertinent labs & imaging results that were available during my care of the patient were reviewed by me and considered in my medical decision making (see chart for details).  Patient resents to the emergency department for continued generalized weakness in her lower extremities with some associated chest and epigastric abdominal discomfort. She has urinary frequency but no dysuria, hesitancy, urgency. No fevers. Abdominal exam is largely unremarkable with only some mild epigastric discomfort. Very low suspicion for atypical ACS in this case. Will obtain troponin and chest x-ray. EKG is unremarkable. The patient's sodium has decreased significantly since late April in review of her last ED visit. Plan to repeat labs to see if sodium is following further which may explain her generalized weakness. No falls. Patient is awake and alert.    11:34 AM  patient with resolved hyponatremia. No other electrolyte abnormalities. Encouraged PCP follow up and restarting PPI.   At this time, I do not feel there is any life-threatening condition present. I have reviewed and discussed all results (EKG, imaging, lab, urine as appropriate), exam findings with patient. I have reviewed nursing notes and appropriate previous records.  I feel the patient is safe to be discharged home without further emergent workup. Discussed usual and customary return precautions. Patient and family (if present) verbalize  understanding and are comfortable with this plan.  Patient will follow-up with their primary care provider. If they do not have a primary care provider, information for follow-up has been provided to them. All questions have been answered.  ____________________________________________  FINAL CLINICAL IMPRESSION(S) / ED DIAGNOSES  Final diagnoses:  Epigastric abdominal pain  Generalized weakness     MEDICATIONS GIVEN DURING THIS VISIT:  Medications  sodium chloride 0.9 % bolus 500 mL (500 mLs Intravenous New Bag/Given 10/10/16 1011)  gi cocktail (Maalox,Lidocaine,Donnatal) (30 mLs Oral Given 10/10/16 1026)     NEW OUTPATIENT MEDICATIONS STARTED DURING THIS VISIT:  New Prescriptions   OMEPRAZOLE (PRILOSEC) 20 MG CAPSULE    Take 1 capsule (20 mg total) by mouth daily.   SUCRALFATE (CARAFATE) 1 GM/10ML SUSPENSION    Take 10 mLs (1 g total) by mouth 4 (four) times daily -  with meals and at bedtime.     Note:  This document was  prepared using Conservation officer, historic buildings and may include unintentional dictation errors.  Alona Bene, MD Emergency Medicine   Long, Arlyss Repress, MD 10/10/16 1134

## 2016-10-10 NOTE — ED Triage Notes (Signed)
Pt c/o N/V/D x 2-3 days. Pt c/o epigastic and chest pain since yesterday. Pt describes pain as burning and a fullness. Pt also reports legs have been weak. Last vomited last night. Denies SOB.

## 2016-10-10 NOTE — Telephone Encounter (Signed)
Patient called back in stating that she is going to the ED.

## 2016-10-10 NOTE — Telephone Encounter (Signed)
Error

## 2016-10-10 NOTE — Telephone Encounter (Signed)
I called and spoke with Denise OfficerPallis Forbes, her sed rate from 07/2016 was elevated at 69, renal function is slightly decreased, I would like for her to come into the office tomorrow to have blood work, IEP-serum for assessing for a gammaglobulinopathy. She has been experiencing fatigue and most of her lab work is normal except Sed rate.

## 2016-10-11 ENCOUNTER — Telehealth (INDEPENDENT_AMBULATORY_CARE_PROVIDER_SITE_OTHER): Payer: Self-pay | Admitting: *Deleted

## 2016-10-11 ENCOUNTER — Ambulatory Visit (INDEPENDENT_AMBULATORY_CARE_PROVIDER_SITE_OTHER): Payer: Medicare Other | Admitting: Specialist

## 2016-10-11 DIAGNOSIS — R700 Elevated erythrocyte sedimentation rate: Secondary | ICD-10-CM | POA: Diagnosis not present

## 2016-10-11 DIAGNOSIS — R5383 Other fatigue: Secondary | ICD-10-CM

## 2016-10-11 NOTE — Telephone Encounter (Signed)
Pt had to leave, she has been here for almost 2 hours and her ride had to leave. [phone]

## 2016-10-11 NOTE — Telephone Encounter (Signed)
Order put in and put up front and patient is aware that this is ready for pick up--- She is aware that she will need to go to [address] location to have blood work done.

## 2016-10-11 NOTE — Telephone Encounter (Signed)
She is aware that she will need to go to [address] location to have blood work done.

## 2016-10-11 NOTE — Telephone Encounter (Signed)
Order put in and put up front and patient is aware that this is ready for pick up

## 2016-10-15 LAB — PROTEIN ELECTROPHORESIS, SERUM, WITH REFLEX
ALPHA-2-GLOBULIN: 0.8 g/dL (ref 0.5–0.9)
Albumin ELP: 3.7 g/dL — ABNORMAL LOW (ref 3.8–4.8)
Alpha-1-Globulin: 0.4 g/dL — ABNORMAL HIGH (ref 0.2–0.3)
BETA 2: 0.4 g/dL (ref 0.2–0.5)
Beta Globulin: 0.4 g/dL (ref 0.4–0.6)
GAMMA GLOBULIN: 1.1 g/dL (ref 0.8–1.7)
TOTAL PROTEIN, SERUM ELECTROPHOR: 6.8 g/dL (ref 6.1–8.1)

## 2016-10-17 ENCOUNTER — Ambulatory Visit: Payer: Medicare Other | Admitting: Nurse Practitioner

## 2016-10-17 ENCOUNTER — Ambulatory Visit (INDEPENDENT_AMBULATORY_CARE_PROVIDER_SITE_OTHER): Payer: Medicare Other | Admitting: Specialist

## 2016-10-17 ENCOUNTER — Encounter (INDEPENDENT_AMBULATORY_CARE_PROVIDER_SITE_OTHER): Payer: Self-pay | Admitting: Specialist

## 2016-10-17 VITALS — BP 137/80 | HR 78 | Ht 63.0 in | Wt 253.0 lb

## 2016-10-17 DIAGNOSIS — R29898 Other symptoms and signs involving the musculoskeletal system: Secondary | ICD-10-CM | POA: Diagnosis not present

## 2016-10-17 DIAGNOSIS — M4326 Fusion of spine, lumbar region: Secondary | ICD-10-CM

## 2016-10-17 DIAGNOSIS — G9519 Other vascular myelopathies: Secondary | ICD-10-CM

## 2016-10-17 DIAGNOSIS — M48062 Spinal stenosis, lumbar region with neurogenic claudication: Secondary | ICD-10-CM

## 2016-10-17 NOTE — Progress Notes (Signed)
Office Visit Note   Patient: [name]           Date of Birth: 30-Oct-1945           MRN: [id number] Visit Date: 10/17/2016              Requested by: Dorothyann Peng, MD [address] [place], [place] [address] PCP: Anne Ng, NP   Assessment & Plan: Visit Diagnoses:  1. Bilateral leg weakness   2. Neurogenic claudication   3. Fusion of lumbar spine     Plan: Avoid bending, stooping and avoid lifting weights greater than 10 lbs. Avoid prolong standing and walking. Avoid frequent bending and stooping  No lifting greater than 10 lbs. May use ice or moist heat for pain. Weight loss is of benefit. Handicap license is approved. Tylenol 500mg  Tablet up to 4 x per day. Follow-Up Instructions: No Follow-up on file.   Orders:  No orders of the defined types were placed in this encounter.  No orders of the defined types were placed in this encounter.     Procedures: No procedures performed   Clinical Data: No additional findings.   Subjective: Chief Complaint  Patient presents with  . Lower Back - Follow-up    71 year old female presents today after being seen in the ER and calling because her legs are weak and feel funny like. They feel tired, they don't want to go, don't want to move. Able to walk, she forces herself to walk. She  Does grocery shop, there is no body but me. She lives alone but her son comes by and visits her daily. They hurt sometimes into the left thigh greater than right. She feels weak and tired in the medial thighs with walking. She is able to transition from sitting to standing without difficulty. Stairs holding on to the post for the porch to get up, she uses the left leg to get up the step. After walking the legs felt better then getting up this AM with increased pain. She has spurs on her feet in the heels. No SOB, gets tired like going to the dumpster and back. Walking a distance couple hundred feet.Neck is okay, hurts  with night cap. Mid back between shoulders is okay. She feels pain in her lower back but it's not bad. Has degeneration of the hip on the left side, it only hurts now and then.  No bowel or bladder incontinence. Dr. West Bali stopped her diuretic following going to the hospital. She is going to see her again in a week. She is taking losartin and aspirin and at night she takes simvastatin. Taken of the other BP med. No history of diabetes. Last Thursday seen by ambulance and her VSS. She was hurting and unable to move without a lot of pain.     Review of Systems  HENT: Negative.   Eyes: Negative.   Respiratory: Negative.   Cardiovascular: Negative.   Gastrointestinal: Negative.   Endocrine: Negative.   Genitourinary: Negative.   Musculoskeletal: Positive for back pain and gait problem.  Skin: Negative.   Allergic/Immunologic: Negative.   Neurological: Positive for weakness and numbness.  Hematological: Negative.   Psychiatric/Behavioral: Negative.  Negative for agitation, behavioral problems, confusion, decreased concentration, dysphoric mood, hallucinations, self-injury, sleep disturbance and suicidal ideas. The patient is not nervous/anxious and is not hyperactive.      Objective: Vital Signs: BP 137/80 (BP Location: Left Arm, Patient Position: Sitting)   Pulse  78   Ht 5\' 3"  (1.6 m)   Wt 253 lb (114.8 kg)   BMI 44.82 kg/m   Physical Exam  Constitutional: She is oriented to person, place, and time. She appears well-developed and well-nourished.  HENT:  Head: Normocephalic and atraumatic.  Eyes: EOM are normal. Pupils are equal, round, and reactive to light.  Neck: Normal range of motion. Neck supple.  Pulmonary/Chest: Effort normal and breath sounds normal.  Abdominal: Soft. Bowel sounds are normal.  Musculoskeletal: Normal range of motion.  Neurological: She is alert and oriented to person, place, and time.  Skin: Skin is warm and dry.  Psychiatric: She has a normal  mood and affect. Her behavior is normal. Judgment and thought content normal.    Ortho Exam  Specialty Comments:  No specialty comments available.  Imaging: No results found.   PMFS History: Patient Active Problem List   Diagnosis Date Noted  . Spinal stenosis, lumbar region, with neurogenic claudication 01/23/2016    Priority: High    Class: Chronic  . Other secondary scoliosis, 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    Priority: High    Class: Chronic  . HNP (herniated nucleus pulposus), cervical 07/20/2012    Priority: High    Class: Acute  . Cervical spondylosis without myelopathy 07/20/2012    Priority: High    Class: Chronic  . S/P partial thyroidectomy 08/23/2016  . Spondylolisthesis of 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  . Diastolic dysfunction   . Hypokalemia 03/08/2015  . Bradycardia 03/08/2015  . Nausea and vomiting 03/03/2015  . Syncope and collapse 03/02/2015  . Pancreatic cyst 02/28/2015  . Venous (peripheral) insufficiency 06/08/2010  . Asymptomatic varicose veins 06/07/2010  . HYPERLIPIDEMIA 04/16/2010  . Essential hypertension 04/16/2010  . GERD 04/16/2010  . HIATAL HERNIA 04/16/2010  . OSTEOARTHRITIS 04/16/2010  . DYSPHAGIA UNSPECIFIED 04/16/2010  . Atrophic vaginitis 03/31/2009   Past Medical History:  Diagnosis Date  . Allergy   . Arthritis   . Cataract of right eye   . Diastolic dysfunction   . Dizzy   . GERD (gastroesophageal reflux disease)   . Hyperlipidemia   . Hypertension   . Pancreatic cyst 02/2015   Recommend follow-up MRI in 12 months.    Family History  Problem Relation Age of Onset  . Ovarian cancer Mother   . Heart disease Mother   . Prostate cancer Father   . Heart disease Father   . Colon cancer Father   . Breast cancer Sister   . Prostate cancer Brother   . Cystic fibrosis Sister   . Heart disease Sister   . Pancreatic cancer Neg Hx   . Rectal cancer Neg Hx   . Stomach cancer Neg Hx     Past Surgical History:  Procedure Laterality  Date  . ABDOMINAL HYSTERECTOMY    . ANTERIOR CERVICAL DECOMP/DISCECTOMY FUSION N/A 07/20/2012   Procedure: ANTERIOR CERVICAL DISCECTOMY FUSION C5-6, C6-7 with transgraft(Alphatec), local bone graft, plate and screws ;  Surgeon: Kerrin Champagne, MD;  Location: [facility] OR;  Service: Orthopedics;  Laterality: N/A;  . BACK SURGERY    . COLONOSCOPY    . ESOPHAGOGASTRODUODENOSCOPY    . NECK SURGERY    . REPLACEMENT TOTAL KNEE BILATERAL    . THYROIDECTOMY, PARTIAL    . TRANSFORAMINAL LUMBAR INTERBODY FUSION (TLIF) WITH PEDICLE SCREW FIXATION 2 LEVEL N/A 01/23/2016   Procedure: TRANSFORAMINAL LUMBAR INTERBODY FUSION (TLIF) WITH PEDICLE SCREW FIXATION 2 LEVEL WITH HARDWARE REMOVAL AND EXTENSION OF HARDWARE.;  Surgeon: Kerrin Champagne, MD;  Location: [facility] OR;  Service: Orthopedics;  Laterality: N/A;  L1-L3    Social History   Occupational History  . Retired    Social History Main Topics  . Smoking status: Never Smoker  . Smokeless tobacco: Never Used  . Alcohol use No  . Drug use: No  . Sexual activity: Not on file

## 2016-10-17 NOTE — Patient Instructions (Addendum)
Avoid bending, stooping and avoid lifting weights greater than 10 lbs. Avoid prolong standing and walking. Avoid frequent bending and stooping  No lifting greater than 10 lbs. May use ice or moist heat for pain. Weight loss is of benefit. Handicap license is approved. Tylenol 500mg  Tablet up to 4 x per day.

## 2016-10-18 ENCOUNTER — Ambulatory Visit: Payer: Medicare Other | Admitting: Nurse Practitioner

## 2016-10-18 LAB — BASIC METABOLIC PANEL
BUN: 14 mg/dL (ref 7–25)
CALCIUM: 9.4 mg/dL (ref 8.6–10.4)
CO2: 26 mmol/L (ref 20–31)
Chloride: 98 mmol/L (ref 98–110)
Creat: 1.16 mg/dL — ABNORMAL HIGH (ref 0.50–0.99)
GLUCOSE: 86 mg/dL (ref 65–99)
Potassium: 4.2 mmol/L (ref 3.5–5.3)
SODIUM: 134 mmol/L — AB (ref 135–146)

## 2016-10-31 ENCOUNTER — Ambulatory Visit: Payer: Medicare Other | Admitting: Nurse Practitioner

## 2016-11-04 ENCOUNTER — Ambulatory Visit
Admission: RE | Admit: 2016-11-04 | Discharge: 2016-11-04 | Disposition: A | Discharge status: Home / Self Care | Payer: Medicare Other | Source: Ambulatory Visit | Attending: Specialist | Admitting: Specialist

## 2016-11-04 DIAGNOSIS — M47816 Spondylosis without myelopathy or radiculopathy, lumbar region: Secondary | ICD-10-CM | POA: Diagnosis not present

## 2016-11-04 DIAGNOSIS — R29898 Other symptoms and signs involving the musculoskeletal system: Secondary | ICD-10-CM

## 2016-11-04 MED ORDER — GADOBENATE DIMEGLUMINE 529 MG/ML IV SOLN
20.0000 mL | Freq: Once | INTRAVENOUS | Status: AC | PRN
  Administered 2016-11-04: 20 mL via INTRAVENOUS

## 2016-11-06 ENCOUNTER — Telehealth: Payer: Self-pay | Admitting: Nurse Practitioner

## 2016-11-19 ENCOUNTER — Other Ambulatory Visit (INDEPENDENT_AMBULATORY_CARE_PROVIDER_SITE_OTHER): Payer: Self-pay | Admitting: Specialist

## 2016-11-20 NOTE — Progress Notes (Signed)
Has follow up on 12/02/16

## 2016-12-02 ENCOUNTER — Ambulatory Visit (INDEPENDENT_AMBULATORY_CARE_PROVIDER_SITE_OTHER): Payer: Medicare Other | Admitting: Specialist

## 2016-12-03 ENCOUNTER — Telehealth (INDEPENDENT_AMBULATORY_CARE_PROVIDER_SITE_OTHER): Payer: Self-pay | Admitting: Specialist

## 2016-12-03 NOTE — Telephone Encounter (Signed)
Patient called requesting Dr. Otelia SergeantNitka give her a call with her MRI results. Thank you. [phone]

## 2016-12-03 NOTE — Telephone Encounter (Signed)
I called and advised patient that they will review her scan on Monday 12/09/16 with her and show her the pictures so she gets a clear understanding of what is going on with her.--she understands and she will be here @ 830am  12/09/16

## 2016-12-04 ENCOUNTER — Encounter (INDEPENDENT_AMBULATORY_CARE_PROVIDER_SITE_OTHER): Payer: Self-pay | Admitting: Specialist

## 2016-12-04 NOTE — Progress Notes (Signed)
Patient underwent a laboratory blood draw for an Elevated sedementation rate.  The draw performed by office personnel. Not seen by physician or examined.

## 2016-12-06 ENCOUNTER — Telehealth (INDEPENDENT_AMBULATORY_CARE_PROVIDER_SITE_OTHER): Payer: Self-pay | Admitting: *Deleted

## 2016-12-06 NOTE — Telephone Encounter (Signed)
I moved pts MRI from james's schedule on 8/13 to [name] on 9/26, that was his first available. Pt asking if this is something she can be seen earlier for.

## 2016-12-09 ENCOUNTER — Ambulatory Visit (INDEPENDENT_AMBULATORY_CARE_PROVIDER_SITE_OTHER): Payer: Medicare Other | Admitting: Surgery

## 2016-12-09 ENCOUNTER — Encounter: Payer: Medicare Other | Admitting: Gastroenterology

## 2016-12-10 NOTE — Telephone Encounter (Signed)
Wrote patients name on list to call if someone cancels

## 2016-12-18 ENCOUNTER — Telehealth (INDEPENDENT_AMBULATORY_CARE_PROVIDER_SITE_OTHER): Payer: Self-pay | Admitting: Radiology

## 2016-12-18 NOTE — Telephone Encounter (Signed)
Patient calling triage line today. She is concerned she is urinating frequently after in taking fluid.Denise KitchenMarland KitchenShe is concerned because she was told that there was a cyst on her kidney. She is wondering if her kidney are worst. She has an appointment with PCP on September 4th. This is the soonest she could be seen due to transportation. She would not like to go to ER again, she feels she just waste money there. She states urination is sometimes just only after a minute of drinking water. She would like to know your thoughts, or if there is anything she can take. Please advise. CB# [phone].

## 2016-12-18 NOTE — Telephone Encounter (Signed)
I called patient back advising I spoke with Dr. Otelia Sergeant. Labwork could be done, but the likelihood of loss of bladder function would be highly unlikely since she is still walking and standing normally. She is able to move her feet up and down with problems. She is going to adjust her diet, and see how this does. She will call us with any acute problems or changes. Otherwise she will just follow up with her PCP in the next 1-2 weeks.

## 2016-12-27 ENCOUNTER — Encounter: Payer: Medicare Other | Admitting: Gastroenterology

## 2016-12-27 ENCOUNTER — Ambulatory Visit: Payer: Medicare Other | Admitting: Nurse Practitioner

## 2016-12-31 ENCOUNTER — Ambulatory Visit: Payer: Medicare Other | Admitting: Nurse Practitioner

## 2016-12-31 ENCOUNTER — Telehealth (INDEPENDENT_AMBULATORY_CARE_PROVIDER_SITE_OTHER): Payer: Self-pay | Admitting: Specialist

## 2016-12-31 NOTE — Telephone Encounter (Signed)
Patient called asked if she can get something for pain. Patient said she do not want anything strong for the pain. Patient said her cousin told her about naproxen

## 2016-12-31 NOTE — Telephone Encounter (Signed)
Patient called asked if she can get something for pain. Patient said she do not want anything strong for the pain. Patient said her cousin told her about naproxen. The number to contact patient is [phone]

## 2017-01-06 ENCOUNTER — Encounter: Payer: Self-pay | Admitting: Nurse Practitioner

## 2017-01-06 ENCOUNTER — Ambulatory Visit (INDEPENDENT_AMBULATORY_CARE_PROVIDER_SITE_OTHER): Payer: Medicare Other | Admitting: Nurse Practitioner

## 2017-01-06 VITALS — BP 128/82 | HR 61 | Temp 98.3°F | Ht 63.0 in | Wt 252.0 lb

## 2017-01-06 DIAGNOSIS — F4322 Adjustment disorder with anxiety: Secondary | ICD-10-CM | POA: Diagnosis not present

## 2017-01-06 DIAGNOSIS — K219 Gastro-esophageal reflux disease without esophagitis: Secondary | ICD-10-CM | POA: Diagnosis not present

## 2017-01-06 DIAGNOSIS — R102 Pelvic and perineal pain: Secondary | ICD-10-CM | POA: Diagnosis not present

## 2017-01-06 DIAGNOSIS — I10 Essential (primary) hypertension: Secondary | ICD-10-CM

## 2017-01-06 DIAGNOSIS — E782 Mixed hyperlipidemia: Secondary | ICD-10-CM

## 2017-01-06 LAB — POCT URINALYSIS DIPSTICK
BILIRUBIN UA: NEGATIVE
Blood, UA: NEGATIVE
Glucose, UA: NEGATIVE
KETONES UA: NEGATIVE
LEUKOCYTES UA: NEGATIVE
Nitrite, UA: NEGATIVE
Protein, UA: NEGATIVE
Spec Grav, UA: 1.015 (ref 1.010–1.025)
Urobilinogen, UA: 0.2 E.U./dL
pH, UA: 6 (ref 5.0–8.0)

## 2017-01-06 MED ORDER — SIMVASTATIN 20 MG PO TABS: 20.0000 mg | ORAL_TABLET | Freq: Every day | ORAL | 1 refills | Status: DC

## 2017-01-06 MED ORDER — OMEPRAZOLE 20 MG PO CPDR: 20.0000 mg | DELAYED_RELEASE_CAPSULE | Freq: Every day | ORAL | 1 refills | Status: DC

## 2017-01-06 MED ORDER — AMLODIPINE BESYLATE 5 MG PO TABS: 5.0000 mg | ORAL_TABLET | Freq: Every day | ORAL | 1 refills | Status: DC

## 2017-01-06 MED ORDER — RANITIDINE HCL 300 MG PO TABS: 300.0000 mg | ORAL_TABLET | Freq: Every day | ORAL | 1 refills | Status: DC

## 2017-01-06 MED ORDER — BUSPIRONE HCL 5 MG PO TABS: 5.0000 mg | ORAL_TABLET | Freq: Two times a day (BID) | ORAL | 1 refills | Status: DC

## 2017-01-06 NOTE — Assessment & Plan Note (Signed)
Has hiatal hernia Resume omeprazole and ranitidine

## 2017-01-06 NOTE — Telephone Encounter (Signed)
Unfortunately her kidney function is borderline for use of arthritis medications like naprosyn, it is possible that the last  Blood test was when she was dehydrated. Due to the value of the tests though I can not recommend naprosyn. Motrin, alleve or advil should not be used if kidney function is borderline. jen She should take tylenol 500 mg up to 3-4 times per day. Tramadol may be an alternative if pain not improved wit tylenol.

## 2017-01-06 NOTE — Progress Notes (Signed)
Subjective:  Patient ID: [name], female    DOB: [birth year]  Age: 71 y.o. MRN: [id number]  CC: GI Problem (abd pain when eat certain food/difficult to use restroom. BP meds refill) and Follow-up (nervouse is getting bad since friend pass away, cant deal with noises/she lives in aparment)   Depression       The patient presents with depression.  This is a recurrent problem.  The current episode started more than 1 month ago.   The onset quality is gradual.   The problem occurs intermittently.  The problem has been waxing and waning since onset.  Associated symptoms include decreased concentration, fatigue, irritable, decreased interest, appetite change, indigestion and sad.  Associated symptoms include no helplessness, no hopelessness, does not have insomnia, no restlessness, no body aches, no myalgias, no headaches and no suicidal ideas.     The symptoms are aggravated by family issues and social issues.  Past treatments include nothing.  Past medical history includes chronic pain, anxiety and depression.     Pertinent negatives include no suicide attempts and no head trauma.   GERD: Chronic, intermittent. No improvement with nexium or sucralfate. Triggered by certain foods. Colonoscopy done 2012 (normal) Endoscopy done 2015 (gastritis, positive H. Pylori, treated with oral abx) Positive anorexia,  No melena, no hematochezia, no weight loss.  HTN: needs medication refilled. BP Readings from Last 3 Encounters:  01/06/17 128/82  10/17/16 137/80  10/10/16 140/76   Hyperlipidemia: Needs med refill Lab Results  Component Value Date   CHOL 168 08/23/2016   HDL 45.60 08/23/2016   LDLDIRECT 60.0 08/23/2016   TRIG 215.0 (H) 08/23/2016   CHOLHDL 4 08/23/2016    Outpatient Medications Prior to Visit  Medication Sig Dispense Refill  . aspirin EC 81 MG tablet Take 81 mg by mouth daily.    [name] losartan (COZAAR) 100 MG tablet Take 1 tablet (100 mg total) by mouth daily. 90 tablet 1    . simvastatin (ZOCOR) 20 MG tablet Take 1 tablet (20 mg total) by mouth daily at 6 PM. 90 tablet 1  . triamterene-hydrochlorothiazide (MAXZIDE-25) 37.5-25 MG tablet Take 0.5 tablets by mouth daily. 45 tablet 1  . HYDROcodone-acetaminophen (NORCO/VICODIN) 5-325 MG tablet Take 1 tablet by mouth every 6 (six) hours as needed for moderate pain.    . Multiple Vitamin (MULTIVITAMIN WITH MINERALS) TABS tablet Take 1 tablet by mouth daily.    [name] esomeprazole (NEXIUM 24HR) 20 MG capsule Take 1 capsule (20 mg total) by mouth daily. (Patient not taking: Reported on 01/06/2017) 90 capsule 1  . gabapentin (NEURONTIN) 300 MG capsule Take 1 capsule orally three times daily, office visit needed before next refill (Patient not taking: Reported on 01/06/2017) 90 capsule 0  . ibuprofen (ADVIL,MOTRIN) 200 MG tablet Take 200 mg by mouth every 6 (six) hours as needed for fever, headache, mild pain, moderate pain or cramping.     [name] omeprazole (PRILOSEC) 20 MG capsule Take 1 capsule (20 mg total) by mouth daily. 30 capsule 0  . sucralfate (CARAFATE) 1 GM/10ML suspension Take 10 mLs (1 g total) by mouth 4 (four) times daily -  with meals and at bedtime. (Patient not taking: Reported on 01/06/2017) 420 mL 0   No facility-administered medications prior to visit.     ROS See HPI  Objective:  BP 128/82   Pulse 61   Temp 98.3 F (36.8 C)   Ht  (1.6 m)   Wt 252 lb (114.3 kg)  SpO2 98%   BMI 44.64 kg/m   BP Readings from Last 3 Encounters:  01/06/17 128/82  10/17/16 137/80  10/10/16 140/76    Wt Readings from Last 3 Encounters:  01/06/17 252 lb (114.3 kg)  10/17/16 253 lb (114.8 kg)  10/10/16 260 lb (117.9 kg)    Physical Exam  Constitutional: She is oriented to person, place, and time. She is irritable. No distress.  Cardiovascular: Normal rate and regular rhythm.   Pulmonary/Chest: Effort normal and breath sounds normal.  Abdominal: Soft. Bowel sounds are normal. She exhibits no distension. There is  tenderness. There is no rebound and no guarding.  Suprapubic tenderness  Musculoskeletal: She exhibits no edema.  Neurological: She is alert and oriented to person, place, and time.  Skin: Skin is warm and dry.  Psychiatric: She has a normal mood and affect. Her behavior is normal.  Vitals reviewed.   Lab Results  Component Value Date   WBC 4.2 10/10/2016   HGB 11.7 (L) 10/10/2016   HCT 35.0 (L) 10/10/2016   PLT 242 10/10/2016   GLUCOSE 86 10/17/2016   CHOL 168 08/23/2016   TRIG 215.0 (H) 08/23/2016   HDL 45.60 08/23/2016   LDLDIRECT 60.0 08/23/2016   ALT 8 (L) 10/10/2016   AST 20 10/10/2016   NA 134 (L) 10/17/2016   K 4.2 10/17/2016   CL 98 10/17/2016   CREATININE 1.16 (H) 10/17/2016   BUN 14 10/17/2016   CO2 26 10/17/2016   TSH 2.489 10/08/2016   INR 1.11 01/19/2016    Mr Lumbar Spine W Wo Contrast  Result Date: 11/04/2016 CLINICAL DATA:  Bilateral leg weakness. Increasing thigh claudication EXAM: MRI LUMBAR SPINE WITHOUT AND WITH CONTRAST TECHNIQUE: Multiplanar and multiecho pulse sequences of the lumbar spine were obtained without and with intravenous contrast. CONTRAST:  20mL MULTIHANCE GADOBENATE DIMEGLUMINE 529 MG/ML IV SOLN COMPARISON:  Lumbar MRI 11/18/2015 FINDINGS: Segmentation: Lowest fully developed disc space L5-S1. Hypoplastic disc space at S1-S2. Numbering convention is consistent with the prior report. Alignment: Retrolisthesis T12-L1 has developed since prior study. Some of this is due to field distortion from interval placement of hardware. 12 mm anterolisthesis L4-5 is unchanged. Vertebrae: Negative for fracture or mass. No evidence of disc space infection. Conus medullaris: Extends to the Conus appears to terminate at L2 Level and appears normal. Conus not well seen due to field distortion from hardware. Paraspinal and other soft tissues: Paraspinous muscle atrophy. No fluid collection or mass. Disc levels: T12-L1: Retrolisthesis. Progressive disc degeneration  with probable central disc protrusion. Spinal canal difficult to evaluate due to field distortion from hardware. Probable mild spinal stenosis. L1-2: Interval PLIF and posterior decompression. Negative for stenosis L2-3:  PLIF with posterior decompression.  Negative for stenosis L3-4: Bilateral pedicle screw fusion and posterior decompression. Negative for stenosis L4-5: Bilateral pedicle screw fusion with posterior decompression. Grade 2 anterior slip is unchanged. Moderate right foraminal encroachment is unchanged. L5-S1:  Mild disc and facet degeneration.  Negative for stenosis. IMPRESSION: Progressive disc degeneration at T12-L1 with retrolisthesis and probable central disc protrusion. Spinal canal difficult to evaluate due to field distortion from hardware. Probable mild spinal stenosis. Lumbar fusion L1 through L5. Grade 2 anterior slip L4-5 unchanged. Moderate right foraminal encroachment L4-5 unchanged. Electronically Signed   By: Marlan Palau M.D.   On: 11/04/2016 13:28    Assessment & Plan:   Corabelle was seen today for gi problem and follow-up.  Diagnoses and all orders for this visit:  Adjustment disorder with anxiety -  Discontinue: busPIRone (BUSPAR) 5 MG tablet; Take 1 tablet (5 mg total) by mouth 2 (two) times daily.  Suprapubic abdominal pain -     POCT urinalysis dipstick  Gastroesophageal reflux disease, esophagitis presence not specified -     omeprazole (PRILOSEC) 20 MG capsule; Take 1 capsule (20 mg total) by mouth daily before breakfast. -     ranitidine (ZANTAC) 300 MG tablet; Take 1 tablet (300 mg total) by mouth at bedtime.  Essential hypertension -     amLODipine (NORVASC) 5 MG tablet; Take 1 tablet (5 mg total) by mouth at bedtime.  Mixed hyperlipidemia -     simvastatin (ZOCOR) 20 MG tablet; Take 1 tablet (20 mg total) by mouth daily at 6 PM.   I have discontinued Ms. Soltis's gabapentin, triamterene-hydrochlorothiazide, esomeprazole, losartan, ibuprofen,  omeprazole, and sucralfate. I am also having her start on amLODipine, omeprazole, and ranitidine. Additionally, I am having her maintain her aspirin EC, HYDROcodone-acetaminophen, multivitamin with minerals, ACETAMINOPHEN 8 HOUR PO, simvastatin, and busPIRone.  Meds ordered this encounter  Medications  . ACETAMINOPHEN 8 HOUR PO    Sig: Take 650 mg by mouth 2 (two) times daily as needed.  [name]. amLODipine (NORVASC) 5 MG tablet    Sig: Take 1 tablet (5 mg total) by mouth at bedtime.    Dispense:  90 tablet    Refill:  1    Order Specific Question:   Supervising Provider    Answer:   Tresa GarterPLOTNIKOV, ALEKSEI V [[id number]]  . omeprazole (PRILOSEC) 20 MG capsule    Sig: Take 1 capsule (20 mg total) by mouth daily before breakfast.    Dispense:  90 capsule    Refill:  1    Order Specific Question:   Supervising Provider    Answer:   Tresa GarterPLOTNIKOV, ALEKSEI V [[id number]]  . ranitidine (ZANTAC) 300 MG tablet    Sig: Take 1 tablet (300 mg total) by mouth at bedtime.    Dispense:  90 tablet    Refill:  1    Order Specific Question:   Supervising Provider    Answer:   Tresa GarterPLOTNIKOV, ALEKSEI V [[id number]]  . simvastatin (ZOCOR) 20 MG tablet    Sig: Take 1 tablet (20 mg total) by mouth daily at 6 PM.    Dispense:  90 tablet    Refill:  1    Order Specific Question:   Supervising Provider    Answer:   Tresa GarterPLOTNIKOV, ALEKSEI V [[id number]]  . DISCONTD: busPIRone (BUSPAR) 5 MG tablet    Sig: Take 1 tablet (5 mg total) by mouth 2 (two) times daily.    Dispense:  60 tablet    Refill:  1    Order Specific Question:   Supervising Provider    Answer:   Tresa GarterPLOTNIKOV, ALEKSEI V [[id number]]  . busPIRone (BUSPAR) 5 MG tablet    Sig: Take 1 tablet (5 mg total) by mouth 2 (two) times daily.    Dispense:  60 tablet    Refill:  1    Order Specific Question:   Supervising Provider    Answer:   Tresa GarterPLOTNIKOV, ALEKSEI V [[id number]]    Follow-up: Return in about 4 weeks (around 02/03/2017) for anxiety and HTN (fasting).  Alysia Pennaharlotte Aylee Littrell, NP

## 2017-01-06 NOTE — Assessment & Plan Note (Signed)
Due to worsening kidney function and abnormal electrolyte, discontinued maxzide. continu losartan, Start amlodipine

## 2017-01-08 NOTE — Telephone Encounter (Signed)
I called and advised patient she states that she understands.

## 2017-01-22 ENCOUNTER — Ambulatory Visit (INDEPENDENT_AMBULATORY_CARE_PROVIDER_SITE_OTHER): Payer: Medicare Other | Admitting: Specialist

## 2017-01-22 ENCOUNTER — Encounter (INDEPENDENT_AMBULATORY_CARE_PROVIDER_SITE_OTHER): Payer: Self-pay

## 2017-01-28 ENCOUNTER — Telehealth (INDEPENDENT_AMBULATORY_CARE_PROVIDER_SITE_OTHER): Payer: Self-pay | Admitting: Specialist

## 2017-01-28 NOTE — Telephone Encounter (Signed)
I called and advised patient that she should contact her PCP and see if it would be ok to take this, she states that she already took it earlier around 10am.

## 2017-01-28 NOTE — Telephone Encounter (Signed)
Patient called asked if she can take Zyrtec for the allergies she is having. Patient said she wanted to check with Dr Otelia Sergeant before she takes it. The number to contact patient is [phone]

## 2017-02-03 ENCOUNTER — Encounter: Payer: Self-pay | Admitting: Nurse Practitioner

## 2017-02-03 ENCOUNTER — Ambulatory Visit (INDEPENDENT_AMBULATORY_CARE_PROVIDER_SITE_OTHER): Payer: Medicare Other | Admitting: Nurse Practitioner

## 2017-02-03 VITALS — BP 142/90 | HR 60 | Temp 97.5°F | Ht 63.0 in | Wt 253.0 lb

## 2017-02-03 DIAGNOSIS — Z23 Encounter for immunization: Secondary | ICD-10-CM | POA: Diagnosis not present

## 2017-02-03 DIAGNOSIS — F4322 Adjustment disorder with anxiety: Secondary | ICD-10-CM

## 2017-02-03 DIAGNOSIS — I10 Essential (primary) hypertension: Secondary | ICD-10-CM

## 2017-02-03 MED ORDER — BUSPIRONE HCL 5 MG PO TABS: 5.0000 mg | ORAL_TABLET | Freq: Two times a day (BID) | ORAL | 1 refills | Status: DC

## 2017-02-03 NOTE — Progress Notes (Signed)
Subjective:  Patient ID: [name], female    DOB: Nov 27, 1945  Age: 71 y.o. MRN: [id number]  CC: Follow-up (follow up--had a little papsi--flu shot?)   HPI Anxiety: Improved mood with buspar BID.  HTN: Stable with amlodipine.  GERD: Controlled with zantac and omeprazole.  Outpatient Medications Prior to Visit  Medication Sig Dispense Refill  . ACETAMINOPHEN 8 HOUR PO Take 650 mg by mouth 2 (two) times daily as needed.    [name] amLODipine (NORVASC) 5 MG tablet Take 1 tablet (5 mg total) by mouth at bedtime. 90 tablet 1  . aspirin EC 81 MG tablet Take 81 mg by mouth daily.    [name] HYDROcodone-acetaminophen (NORCO/VICODIN) 5-325 MG tablet Take 1 tablet by mouth every 6 (six) hours as needed for moderate pain.    . Multiple Vitamin (MULTIVITAMIN WITH MINERALS) TABS tablet Take 1 tablet by mouth daily.    [name] omeprazole (PRILOSEC) 20 MG capsule Take 1 capsule (20 mg total) by mouth daily before breakfast. 90 capsule 1  . ranitidine (ZANTAC) 300 MG tablet Take 1 tablet (300 mg total) by mouth at bedtime. 90 tablet 1  . simvastatin (ZOCOR) 20 MG tablet Take 1 tablet (20 mg total) by mouth daily at 6 PM. 90 tablet 1  . busPIRone (BUSPAR) 5 MG tablet Take 1 tablet (5 mg total) by mouth 2 (two) times daily. 60 tablet 1   No facility-administered medications prior to visit.     ROS Review of Systems  Respiratory: Negative for shortness of breath.   Cardiovascular: Negative for chest pain and leg swelling.  Musculoskeletal: Negative for falls.  Skin: Negative.   Neurological: Negative for dizziness and headaches.  Psychiatric/Behavioral: Positive for depression. Negative for suicidal ideas. The patient is nervous/anxious. The patient does not have insomnia.      Objective:  BP (!) 142/90   Pulse 60   Temp (!) 97.5 F (36.4 C)   Ht  (1.6 m)   Wt 253 lb (114.8 kg)   SpO2 99%   BMI 44.82 kg/m   BP Readings from Last 3 Encounters:  02/03/17 (!) 142/90  01/06/17 128/82    10/17/16 137/80    Wt Readings from Last 3 Encounters:  02/03/17 253 lb (114.8 kg)  01/06/17 252 lb (114.3 kg)  10/17/16 253 lb (114.8 kg)    Physical Exam  Constitutional: She is oriented to person, place, and time. No distress.  Cardiovascular: Normal rate and regular rhythm.   Pulmonary/Chest: Effort normal and breath sounds normal.  Musculoskeletal: She exhibits no edema.  Neurological: She is alert and oriented to person, place, and time.  Vitals reviewed.   Lab Results  Component Value Date   WBC 4.2 10/10/2016   HGB 11.7 (L) 10/10/2016   HCT 35.0 (L) 10/10/2016   PLT 242 10/10/2016   GLUCOSE 86 10/17/2016   CHOL 168 08/23/2016   TRIG 215.0 (H) 08/23/2016   HDL 45.60 08/23/2016   LDLDIRECT 60.0 08/23/2016   ALT 8 (L) 10/10/2016   AST 20 10/10/2016   NA 134 (L) 10/17/2016   K 4.2 10/17/2016   CL 98 10/17/2016   CREATININE 1.16 (H) 10/17/2016   BUN 14 10/17/2016   CO2 26 10/17/2016   TSH 2.489 10/08/2016   INR 1.11 01/19/2016    Mr Lumbar Spine W Wo Contrast  Result Date: 11/04/2016 CLINICAL DATA:  Bilateral leg weakness. Increasing thigh claudication EXAM: MRI LUMBAR SPINE WITHOUT AND WITH CONTRAST TECHNIQUE: Multiplanar and multiecho pulse sequences of  the lumbar spine were obtained without and with intravenous contrast. CONTRAST:  20mL MULTIHANCE GADOBENATE DIMEGLUMINE 529 MG/ML IV SOLN COMPARISON:  Lumbar MRI 11/18/2015 FINDINGS: Segmentation: Lowest fully developed disc space L5-S1. Hypoplastic disc space at S1-S2. Numbering convention is consistent with the prior report. Alignment: Retrolisthesis T12-L1 has developed since prior study. Some of this is due to field distortion from interval placement of hardware. 12 mm anterolisthesis L4-5 is unchanged. Vertebrae: Negative for fracture or mass. No evidence of disc space infection. Conus medullaris: Extends to the Conus appears to terminate at L2 Level and appears normal. Conus not well seen due to field distortion  from hardware. Paraspinal and other soft tissues: Paraspinous muscle atrophy. No fluid collection or mass. Disc levels: T12-L1: Retrolisthesis. Progressive disc degeneration with probable central disc protrusion. Spinal canal difficult to evaluate due to field distortion from hardware. Probable mild spinal stenosis. L1-2: Interval PLIF and posterior decompression. Negative for stenosis L2-3:  PLIF with posterior decompression.  Negative for stenosis L3-4: Bilateral pedicle screw fusion and posterior decompression. Negative for stenosis L4-5: Bilateral pedicle screw fusion with posterior decompression. Grade 2 anterior slip is unchanged. Moderate right foraminal encroachment is unchanged. L5-S1:  Mild disc and facet degeneration.  Negative for stenosis. IMPRESSION: Progressive disc degeneration at T12-L1 with retrolisthesis and probable central disc protrusion. Spinal canal difficult to evaluate due to field distortion from hardware. Probable mild spinal stenosis. Lumbar fusion L1 through L5. Grade 2 anterior slip L4-5 unchanged. Moderate right foraminal encroachment L4-5 unchanged. Electronically Signed   By: Marlan Palau M.D.   On: 11/04/2016 13:28    Assessment & Plan:   Rakayla was seen today for follow-up.  Diagnoses and all orders for this visit:  Essential hypertension  Adjustment disorder with anxiety -     busPIRone (BUSPAR) 5 MG tablet; Take 1 tablet (5 mg total) by mouth 2 (two) times daily.  Need for influenza vaccination -     Flu vaccine HIGH DOSE PF   I am having Ms. Cathlean Cower maintain her aspirin EC, HYDROcodone-acetaminophen, multivitamin with minerals, ACETAMINOPHEN 8 HOUR PO, amLODipine, omeprazole, ranitidine, simvastatin, and busPIRone.  Meds ordered this encounter  Medications  . busPIRone (BUSPAR) 5 MG tablet    Sig: Take 1 tablet (5 mg total) by mouth 2 (two) times daily.    Dispense:  180 tablet    Refill:  1    Order Specific Question:   Supervising Provider     Answer:   Tresa Garter [[id number]]    Follow-up: Return in about 6 months (around 08/04/2017) for with new NP (fasting).  Alysia Penna, NP

## 2017-02-28 ENCOUNTER — Encounter (INDEPENDENT_AMBULATORY_CARE_PROVIDER_SITE_OTHER): Payer: Self-pay | Admitting: Specialist

## 2017-02-28 ENCOUNTER — Telehealth (INDEPENDENT_AMBULATORY_CARE_PROVIDER_SITE_OTHER): Payer: Self-pay | Admitting: Specialist

## 2017-02-28 ENCOUNTER — Ambulatory Visit (INDEPENDENT_AMBULATORY_CARE_PROVIDER_SITE_OTHER): Payer: Medicare Other | Admitting: Specialist

## 2017-02-28 VITALS — BP 166/84 | HR 52 | Ht 63.0 in | Wt 253.0 lb

## 2017-02-28 DIAGNOSIS — M47816 Spondylosis without myelopathy or radiculopathy, lumbar region: Secondary | ICD-10-CM

## 2017-02-28 LAB — BASIC METABOLIC PANEL
BUN / CREAT RATIO: 10 (calc) (ref 6–22)
BUN: 11 mg/dL (ref 7–25)
CALCIUM: 9.5 mg/dL (ref 8.6–10.4)
CHLORIDE: 106 mmol/L (ref 98–110)
CO2: 26 mmol/L (ref 20–32)
Creat: 1.05 mg/dL — ABNORMAL HIGH (ref 0.60–0.93)
GLUCOSE: 80 mg/dL (ref 65–99)
Potassium: 4.4 mmol/L (ref 3.5–5.3)
SODIUM: 146 mmol/L (ref 135–146)

## 2017-02-28 LAB — EXTRA LAV TOP TUBE

## 2017-02-28 MED ORDER — NAPROXEN 500 MG PO TABS
500.0000 mg | ORAL_TABLET | Freq: Two times a day (BID) | ORAL | 2 refills | Status: DC
Start: 2017-02-28 — End: 2017-06-05

## 2017-02-28 NOTE — Telephone Encounter (Signed)
Returned call to patient left message for a return call °

## 2017-02-28 NOTE — Patient Instructions (Signed)
Avoid frequent bending and stooping  No lifting greater than 10 lbs. May use ice or moist heat for pain. Weight loss is of benefit. Handicap license is approved.   

## 2017-02-28 NOTE — Progress Notes (Signed)
Office Visit Note   Patient: [name]           Date of Birth: 1946/02/25           MRN: [id number] Visit Date: 02/28/2017              Requested by: Anne NgNche, Charlotte Lum, NP [address] [place], [place] [id number] PCP: Anne NgNche, Charlotte Lum, NP   Assessment & Plan: Visit Diagnoses:  1. Spondylosis without myelopathy or radiculopathy, lumbar region     Plan: Avoid frequent bending and stooping  No lifting greater than 10 lbs. May use ice or moist heat for pain. Weight loss is of benefit. Handicap license is approved.  Follow-Up Instructions: Return in about 4 weeks (around 03/28/2017).   Orders:  No orders of the defined types were placed in this encounter.  No orders of the defined types were placed in this encounter.     Procedures: No procedures performed   Clinical Data: No additional findings.   Subjective: Chief Complaint  Patient presents with  . Lower Back - Follow-up    MRI Review    HPI  Review of Systems  Constitutional: Negative.   HENT: Negative.   Eyes: Negative.   Respiratory: Negative.   Cardiovascular: Negative.   Gastrointestinal: Negative.   Endocrine: Negative.   Genitourinary: Negative.   Musculoskeletal: Negative.   Skin: Negative.   Allergic/Immunologic: Negative.   Neurological: Negative.   Hematological: Negative.   Psychiatric/Behavioral: Negative.      Objective: Vital Signs: BP (!) 166/84 (BP Location: Left Arm, Patient Position: Sitting)   Pulse (!) 52   Ht 5\' 3"  (1.6 m)   Wt 253 lb (114.8 kg)   BMI 44.82 kg/m   Physical Exam  Constitutional: She is oriented to person, place, and time. She appears well-developed and well-nourished.  HENT:  Head: Normocephalic and atraumatic.  Eyes: Pupils are equal, round, and reactive to light. EOM are normal.  Neck: Normal range of motion. Neck supple.  Pulmonary/Chest: Effort normal and breath sounds normal.  Abdominal: Soft. Bowel sounds are normal.  Neurological:  She is alert and oriented to person, place, and time.  Skin: Skin is warm and dry.  Psychiatric: She has a normal mood and affect. Her behavior is normal. Judgment and thought content normal.    Back Exam   Tenderness  The patient is experiencing tenderness in the lumbar.  Range of Motion  Extension: abnormal  Flexion: abnormal   Muscle Strength  Right Quadriceps:  5/5  Left Quadriceps:  5/5  Right Hamstrings:  5/5  Left Hamstrings:  5/5   Tests  Straight leg raise right: negative  Reflexes  Patellar: normal Achilles: normal Biceps: normal Babinski's sign: normal   Other  Toe Walk: normal Heel Walk: normal Scars: present      Specialty Comments:  No specialty comments available.  Imaging: No results found.   PMFS History: Patient Active Problem List   Diagnosis Date Noted  . Spinal stenosis, lumbar region, with neurogenic claudication 01/23/2016    Priority: High    Class: Chronic  . Other secondary scoliosis, 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    Priority: High    Class: Chronic  . HNP (herniated nucleus pulposus), cervical 07/20/2012    Priority: High    Class: Acute  . Cervical spondylosis without myelopathy 07/20/2012    Priority: High    Class: Chronic  . S/P partial thyroidectomy 08/23/2016  . Spondylolisthesis of 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  . Diastolic  dysfunction   . Hypokalemia 03/08/2015  . Bradycardia 03/08/2015  . Nausea and vomiting 03/03/2015  . Syncope and collapse 03/02/2015  . Pancreatic cyst 02/28/2015  . Venous (peripheral) insufficiency 06/08/2010  . Asymptomatic varicose veins 06/07/2010  . HYPERLIPIDEMIA 04/16/2010  . Essential hypertension 04/16/2010  . GERD 04/16/2010  . HIATAL HERNIA 04/16/2010  . OSTEOARTHRITIS 04/16/2010  . DYSPHAGIA UNSPECIFIED 04/16/2010  . Atrophic vaginitis 03/31/2009   Past Medical History:  Diagnosis Date  . Allergy   . Arthritis   . Cataract of right eye   . Diastolic dysfunction   . Dizzy   .  GERD (gastroesophageal reflux disease)   . Hyperlipidemia   . Hypertension   . Pancreatic cyst 02/2015   Recommend follow-up MRI in 12 months.    Family History  Problem Relation Age of Onset  . Ovarian cancer Mother   . Heart disease Mother   . Prostate cancer Father   . Heart disease Father   . Colon cancer Father   . Breast cancer Sister   . Prostate cancer Brother   . Cystic fibrosis Sister   . Heart disease Sister   . Pancreatic cancer Neg Hx   . Rectal cancer Neg Hx   . Stomach cancer Neg Hx     Past Surgical History:  Procedure Laterality Date  . ABDOMINAL HYSTERECTOMY    . ANTERIOR CERVICAL DECOMP/DISCECTOMY FUSION N/A 07/20/2012   Procedure: ANTERIOR CERVICAL DISCECTOMY FUSION C5-6, C6-7 with transgraft(Alphatec), local bone graft, plate and screws ;  Surgeon: Kerrin Champagne, MD;  Location: [facility] OR;  Service: Orthopedics;  Laterality: N/A;  . BACK SURGERY    . COLONOSCOPY    . ESOPHAGOGASTRODUODENOSCOPY    . NECK SURGERY    . REPLACEMENT TOTAL KNEE BILATERAL    . THYROIDECTOMY, PARTIAL    . TRANSFORAMINAL LUMBAR INTERBODY FUSION (TLIF) WITH PEDICLE SCREW FIXATION 2 LEVEL N/A 01/23/2016   Procedure: TRANSFORAMINAL LUMBAR INTERBODY FUSION (TLIF) WITH PEDICLE SCREW FIXATION 2 LEVEL WITH HARDWARE REMOVAL AND EXTENSION OF HARDWARE.;  Surgeon: Kerrin Champagne, MD;  Location: [facility] OR;  Service: Orthopedics;  Laterality: N/A;  L1-L3    Social History   Occupational History  . Retired    Social History Main Topics  . Smoking status: Never Smoker  . Smokeless tobacco: Never Used  . Alcohol use No  . Drug use: No  . Sexual activity: Not on file

## 2017-03-05 ENCOUNTER — Other Ambulatory Visit (INDEPENDENT_AMBULATORY_CARE_PROVIDER_SITE_OTHER): Payer: Self-pay | Admitting: Specialist

## 2017-03-05 ENCOUNTER — Telehealth (INDEPENDENT_AMBULATORY_CARE_PROVIDER_SITE_OTHER): Payer: Self-pay | Admitting: Specialist

## 2017-03-05 DIAGNOSIS — M4807 Spinal stenosis, lumbosacral region: Secondary | ICD-10-CM

## 2017-03-05 DIAGNOSIS — M4804 Spinal stenosis, thoracic region: Secondary | ICD-10-CM

## 2017-03-05 NOTE — Telephone Encounter (Signed)
Patient has been advised of her labs and her rx was faxed to [facility]

## 2017-03-05 NOTE — Progress Notes (Signed)
To be done with lumbar myelogram and to have post myelogram CT scan of both the thoracic and lumbar.

## 2017-03-05 NOTE — Telephone Encounter (Signed)
Patient called asking about her lab results and her medication that was discussed at her last appointment. CB # [phone]

## 2017-04-09 ENCOUNTER — Other Ambulatory Visit: Payer: Medicare Other

## 2017-05-05 ENCOUNTER — Ambulatory Visit
Admission: RE | Admit: 2017-05-05 | Discharge: 2017-05-05 | Disposition: A | Discharge status: Home / Self Care | Payer: Medicare Other | Source: Ambulatory Visit | Attending: Specialist | Admitting: Specialist

## 2017-05-05 VITALS — BP 105/59 | HR 54

## 2017-05-05 DIAGNOSIS — M4804 Spinal stenosis, thoracic region: Secondary | ICD-10-CM

## 2017-05-05 DIAGNOSIS — M4156 Other secondary scoliosis, lumbar region: Secondary | ICD-10-CM

## 2017-05-05 DIAGNOSIS — M4316 Spondylolisthesis, lumbar region: Secondary | ICD-10-CM

## 2017-05-05 DIAGNOSIS — M4807 Spinal stenosis, lumbosacral region: Secondary | ICD-10-CM

## 2017-05-05 DIAGNOSIS — M48062 Spinal stenosis, lumbar region with neurogenic claudication: Secondary | ICD-10-CM

## 2017-05-05 MED ORDER — IOPAMIDOL (ISOVUE-M 300) INJECTION 61%
10.0000 mL | Freq: Once | INTRAMUSCULAR | Status: AC | PRN
  Administered 2017-05-05: 10 mL via INTRATHECAL

## 2017-05-05 MED ORDER — MEPERIDINE HCL 100 MG/ML IJ SOLN
75.0000 mg | Freq: Once | INTRAMUSCULAR | Status: AC
  Administered 2017-05-05: 75 mg via INTRAMUSCULAR

## 2017-05-05 MED ORDER — DIAZEPAM 5 MG PO TABS
5.0000 mg | ORAL_TABLET | Freq: Once | ORAL | Status: AC
  Administered 2017-05-05: 5 mg via ORAL

## 2017-05-05 MED ORDER — ONDANSETRON HCL 4 MG/2ML IJ SOLN
4.0000 mg | Freq: Once | INTRAMUSCULAR | Status: AC
  Administered 2017-05-05: 4 mg via INTRAMUSCULAR

## 2017-05-05 NOTE — Discharge Instructions (Signed)

## 2017-05-07 ENCOUNTER — Ambulatory Visit (INDEPENDENT_AMBULATORY_CARE_PROVIDER_SITE_OTHER): Payer: Medicare Other | Admitting: Specialist

## 2017-05-28 ENCOUNTER — Other Ambulatory Visit (INDEPENDENT_AMBULATORY_CARE_PROVIDER_SITE_OTHER): Payer: Self-pay | Admitting: Specialist

## 2017-06-02 ENCOUNTER — Ambulatory Visit (INDEPENDENT_AMBULATORY_CARE_PROVIDER_SITE_OTHER): Payer: Medicare Other | Admitting: Specialist

## 2017-06-02 ENCOUNTER — Encounter (INDEPENDENT_AMBULATORY_CARE_PROVIDER_SITE_OTHER): Payer: Self-pay | Admitting: Specialist

## 2017-06-02 VITALS — BP 143/75 | HR 61 | Ht 63.0 in | Wt 253.0 lb

## 2017-06-02 DIAGNOSIS — Z981 Arthrodesis status: Secondary | ICD-10-CM | POA: Diagnosis not present

## 2017-06-02 DIAGNOSIS — M5136 Other intervertebral disc degeneration, lumbar region: Secondary | ICD-10-CM | POA: Diagnosis not present

## 2017-06-02 DIAGNOSIS — M960 Pseudarthrosis after fusion or arthrodesis: Secondary | ICD-10-CM | POA: Diagnosis not present

## 2017-06-02 NOTE — Progress Notes (Signed)
Office Visit Note   Patient: [name]           Date of Birth: 04/20/1946           MRN: [id number] Visit Date: 06/02/2017              Requested by: Anne Ng, NP [address] [place], [place] [address] PCP: Anne Ng, NP   Assessment & Plan: Visit Diagnoses:  1. Pseudarthrosis following spinal fusion   2. Status post lumbar spinal fusion   3. Degenerative disc disease, lumbar     Plan: Avoid frequent bending and stooping  No lifting greater than 10 lbs. May use ice or moist heat for pain. Weight loss is of benefit. Handicap license is approved.  Follow-Up Instructions: Return in about 3 months (around 08/30/2017).   Orders:  No orders of the defined types were placed in this encounter.  No orders of the defined types were placed in this encounter.     Procedures: No procedures performed   Clinical Data: No additional findings.   Subjective: Chief Complaint  Patient presents with  . Middle Back - Follow-up    Ct Myelogram Review  . Lower Back - Follow-up    Ct Myelogram Review    72 year old female with history of bilateral knee osteoarthritis. She had extension of her fusion to the L1-2 and L2-3 level from a previous lumbar fusion to L3-4. Underwent a Myelogram and post myelogram CT of the lumbar spine and thoracic spine. She has complaints of discomfort into her back, no bowel or bladder difficulties. Still having pain that in into her buttocks and legs. No      Review of Systems  Constitutional: Negative.   HENT: Negative.   Eyes: Negative.   Respiratory: Negative.   Cardiovascular: Negative.   Gastrointestinal: Negative.   Endocrine: Negative.   Genitourinary: Negative.   Musculoskeletal: Negative.   Skin: Negative.   Allergic/Immunologic: Negative.   Neurological: Negative.   Hematological: Negative.   Psychiatric/Behavioral: Negative.      Objective: Vital Signs: BP (!) 143/75 (BP Location: Left Arm,  Patient Position: Sitting)   Pulse 61   Ht 5\' 3"  (1.6 m)   Wt 253 lb (114.8 kg)   BMI 44.82 kg/m   Physical Exam  Constitutional: She is oriented to person, place, and time. She appears well-developed and well-nourished.  HENT:  Head: Normocephalic and atraumatic.  Eyes: EOM are normal. Pupils are equal, round, and reactive to light.  Neck: Normal range of motion. Neck supple.  Pulmonary/Chest: Effort normal and breath sounds normal.  Abdominal: Soft. Bowel sounds are normal.  Neurological: She is alert and oriented to person, place, and time.  Skin: Skin is warm and dry.  Psychiatric: She has a normal mood and affect. Her behavior is normal. Judgment and thought content normal.    Back Exam   Tenderness  The patient is experiencing tenderness in the lumbar.  Range of Motion  Extension: abnormal  Flexion: abnormal  Lateral bend right: abnormal  Lateral bend left: abnormal  Rotation right: abnormal  Rotation left: abnormal   Muscle Strength  Right Quadriceps:  5/5  Left Quadriceps:  5/5  Right Hamstrings:  5/5  Left Hamstrings:  5/5   Tests  Straight leg raise right: negative Straight leg raise left: negative  Reflexes  Patellar: normal Achilles: normal Biceps: normal Babinski's sign: normal   Other  Toe walk: normal Heel walk: normal Sensation: normal Gait: normal  Comments:  Inciison is well healed motor without focal deficit      Specialty Comments:  No specialty comments available.  Imaging: No results found.   PMFS History: Patient Active Problem List   Diagnosis Date Noted  . Spinal stenosis, lumbar region, with neurogenic claudication 01/23/2016    Priority: High    Class: Chronic  . Other secondary scoliosis, 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    Priority: High    Class: Chronic  . HNP (herniated nucleus pulposus), cervical 07/20/2012    Priority: High    Class: Acute  . Cervical spondylosis without myelopathy 07/20/2012    Priority: High     Class: Chronic  . S/P partial thyroidectomy 08/23/2016  . Spondylolisthesis of 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  . Diastolic dysfunction   . Hypokalemia 03/08/2015  . Bradycardia 03/08/2015  . Nausea and vomiting 03/03/2015  . Syncope and collapse 03/02/2015  . Pancreatic cyst 02/28/2015  . Venous (peripheral) insufficiency 06/08/2010  . Asymptomatic varicose veins 06/07/2010  . HYPERLIPIDEMIA 04/16/2010  . Essential hypertension 04/16/2010  . GERD 04/16/2010  . HIATAL HERNIA 04/16/2010  . OSTEOARTHRITIS 04/16/2010  . DYSPHAGIA UNSPECIFIED 04/16/2010  . Atrophic vaginitis 03/31/2009   Past Medical History:  Diagnosis Date  . Allergy   . Arthritis   . Cataract of right eye   . Diastolic dysfunction   . Dizzy   . GERD (gastroesophageal reflux disease)   . Hyperlipidemia   . Hypertension   . Pancreatic cyst 02/2015   Recommend follow-up MRI in 12 months.    Family History  Problem Relation Age of Onset  . Ovarian cancer Mother   . Heart disease Mother   . Prostate cancer Father   . Heart disease Father   . Colon cancer Father   . Breast cancer Sister   . Prostate cancer Brother   . Cystic fibrosis Sister   . Heart disease Sister   . Pancreatic cancer Neg Hx   . Rectal cancer Neg Hx   . Stomach cancer Neg Hx     Past Surgical History:  Procedure Laterality Date  . ABDOMINAL HYSTERECTOMY    . ANTERIOR CERVICAL DECOMP/DISCECTOMY FUSION N/A 07/20/2012   Procedure: ANTERIOR CERVICAL DISCECTOMY FUSION C5-6, C6-7 with transgraft(Alphatec), local bone graft, plate and screws ;  Surgeon: Kerrin ChampagneJames E Glenn Christo, MD;  Location: [facility] OR;  Service: Orthopedics;  Laterality: N/A;  . BACK SURGERY    . COLONOSCOPY    . ESOPHAGOGASTRODUODENOSCOPY    . NECK SURGERY    . REPLACEMENT TOTAL KNEE BILATERAL    . THYROIDECTOMY, PARTIAL    . TRANSFORAMINAL LUMBAR INTERBODY FUSION (TLIF) WITH PEDICLE SCREW FIXATION 2 LEVEL N/A 01/23/2016   Procedure: TRANSFORAMINAL LUMBAR INTERBODY FUSION (TLIF) WITH  PEDICLE SCREW FIXATION 2 LEVEL WITH HARDWARE REMOVAL AND EXTENSION OF HARDWARE.;  Surgeon: Kerrin ChampagneJames E Johnni Wunschel, MD;  Location: [facility] OR;  Service: Orthopedics;  Laterality: N/A;  L1-L3    Social History   Occupational History  . Occupation: Retired  Tobacco Use  . Smoking status: Never Smoker  . Smokeless tobacco: Never Used  Substance and Sexual Activity  . Alcohol use: No    Alcohol/week: 0.0 oz  . Drug use: No  . Sexual activity: Not on file

## 2017-06-02 NOTE — Patient Instructions (Signed)
Avoid frequent bending and stooping  No lifting greater than 10 lbs. May use ice or moist heat for pain. Weight loss is of benefit. Handicap license is approved.   

## 2017-06-03 ENCOUNTER — Other Ambulatory Visit: Payer: Self-pay | Admitting: Nurse Practitioner

## 2017-06-03 DIAGNOSIS — I10 Essential (primary) hypertension: Secondary | ICD-10-CM

## 2017-06-03 DIAGNOSIS — E782 Mixed hyperlipidemia: Secondary | ICD-10-CM

## 2017-06-03 NOTE — Telephone Encounter (Signed)
rx sent, pt need to keep an appt with new PCP

## 2017-06-04 ENCOUNTER — Ambulatory Visit (INDEPENDENT_AMBULATORY_CARE_PROVIDER_SITE_OTHER): Payer: Medicare Other | Admitting: Family

## 2017-06-04 ENCOUNTER — Other Ambulatory Visit (INDEPENDENT_AMBULATORY_CARE_PROVIDER_SITE_OTHER): Payer: Medicare Other

## 2017-06-04 ENCOUNTER — Encounter: Payer: Self-pay | Admitting: Family

## 2017-06-04 VITALS — BP 156/86 | HR 69 | Temp 97.6°F | Ht 63.0 in | Wt 259.0 lb

## 2017-06-04 DIAGNOSIS — R110 Nausea: Secondary | ICD-10-CM

## 2017-06-04 LAB — COMPREHENSIVE METABOLIC PANEL
ALT: 5 U/L (ref 0–35)
AST: 14 U/L (ref 0–37)
Albumin: 4.1 g/dL (ref 3.5–5.2)
Alkaline Phosphatase: 61 U/L (ref 39–117)
BILIRUBIN TOTAL: 0.4 mg/dL (ref 0.2–1.2)
BUN: 13 mg/dL (ref 6–23)
CALCIUM: 9.2 mg/dL (ref 8.4–10.5)
CO2: 31 mEq/L (ref 19–32)
CREATININE: 1.09 mg/dL (ref 0.40–1.20)
Chloride: 101 mEq/L (ref 96–112)
GFR: 63.75 mL/min (ref >60.00)
Glucose, Bld: 97 mg/dL (ref 70–99)
Potassium: 4 mEq/L (ref 3.5–5.1)
SODIUM: 139 meq/L (ref 135–145)
TOTAL PROTEIN: 7.6 g/dL (ref 6.0–8.3)

## 2017-06-04 LAB — CBC WITH DIFFERENTIAL/PLATELET
BASOS PCT: 0.8 % (ref 0.0–3.0)
Basophils Absolute: 0 10*3/uL (ref 0.0–0.1)
EOS ABS: 0.2 10*3/uL (ref 0.0–0.7)
Eosinophils Relative: 6.4 % — ABNORMAL HIGH (ref 0.0–5.0)
HEMATOCRIT: 38.7 % (ref 36.0–46.0)
HEMOGLOBIN: 12.7 g/dL (ref 12.0–15.0)
Lymphocytes Relative: 43.1 % (ref 12.0–46.0)
Lymphs Abs: 1.6 10*3/uL (ref 0.7–4.0)
MCHC: 32.7 g/dL (ref 30.0–36.0)
MCV: 78.4 fl (ref 78.0–100.0)
Monocytes Absolute: 0.4 10*3/uL (ref 0.1–1.0)
Monocytes Relative: 10.4 % (ref 3.0–12.0)
Neutro Abs: 1.5 10*3/uL (ref 1.4–7.7)
Neutrophils Relative %: 39.3 % — ABNORMAL LOW (ref 43.0–77.0)
Platelets: 206 10*3/uL (ref 150.0–400.0)
RBC: 4.94 Mil/uL (ref 3.87–5.11)
RDW: 16.5 % — ABNORMAL HIGH (ref 11.5–15.5)
WBC: 3.7 10*3/uL — AB (ref 4.0–10.5)

## 2017-06-04 LAB — AMYLASE: AMYLASE: 62 U/L (ref 27–131)

## 2017-06-04 LAB — LIPASE: Lipase: 82 U/L — ABNORMAL HIGH (ref 11.0–59.0)

## 2017-06-04 NOTE — Progress Notes (Signed)
[name] is a 72 y.o. female with the following history as recorded in [employer]:  Patient Active Problem List   Diagnosis Date Noted  . S/P partial thyroidectomy 08/23/2016  . Spinal stenosis, lumbar region, with neurogenic claudication 01/23/2016    Class: Chronic  . Other secondary scoliosis, 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    Class: Chronic  . Spondylolisthesis of 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  . Diastolic dysfunction   . Hypokalemia 03/08/2015  . Bradycardia 03/08/2015  . Nausea and vomiting 03/03/2015  . Syncope and collapse 03/02/2015  . Pancreatic cyst 02/28/2015  . HNP (herniated nucleus pulposus), cervical 07/20/2012    Class: Acute  . Cervical spondylosis without myelopathy 07/20/2012    Class: Chronic  . Venous (peripheral) insufficiency 06/08/2010  . Asymptomatic varicose veins 06/07/2010  . HYPERLIPIDEMIA 04/16/2010  . Essential hypertension 04/16/2010  . GERD 04/16/2010  . HIATAL HERNIA 04/16/2010  . OSTEOARTHRITIS 04/16/2010  . DYSPHAGIA UNSPECIFIED 04/16/2010  . Atrophic vaginitis 03/31/2009    Current Outpatient Medications  Medication Sig Dispense Refill  . amLODipine (NORVASC) 5 MG tablet TAKE 1 TABLET BY MOUTH AT  BEDTIME 90 tablet 0  . aspirin EC 81 MG tablet Take 81 mg by mouth daily.    . busPIRone (BUSPAR) 5 MG tablet Take 1 tablet (5 mg total) by mouth 2 (two) times daily. 180 tablet 1  . Multiple Vitamin (MULTIVITAMIN WITH MINERALS) TABS tablet Take 1 tablet by mouth daily.    . naproxen (NAPROSYN) 500 MG tablet Take 1 tablet (500 mg total) by mouth 2 (two) times daily with a meal. 60 tablet 2  . simvastatin (ZOCOR) 20 MG tablet TAKE 1 TABLET BY MOUTH  DAILY AT 6 PM. 90 tablet 0   No current facility-administered medications for this visit.     Allergies: Lisinopril; Penicillins; and Tramadol hcl  Past Medical History:  Diagnosis Date  . Allergy   . Arthritis   . Cataract of right eye   . Diastolic dysfunction   . Dizzy   . GERD  (gastroesophageal reflux disease)   . Hyperlipidemia   . Hypertension   . Pancreatic cyst 02/2015   Recommend follow-up MRI in 12 months.    Past Surgical History:  Procedure Laterality Date  . ABDOMINAL HYSTERECTOMY    . ANTERIOR CERVICAL DECOMP/DISCECTOMY FUSION N/A 07/20/2012   Procedure: ANTERIOR CERVICAL DISCECTOMY FUSION C5-6, C6-7 with transgraft(Alphatec), local bone graft, plate and screws ;  Surgeon: Kerrin ChampagneJames E Nitka, MD;  Location: [facility] OR;  Service: Orthopedics;  Laterality: N/A;  . BACK SURGERY    . COLONOSCOPY    . ESOPHAGOGASTRODUODENOSCOPY    . NECK SURGERY    . REPLACEMENT TOTAL KNEE BILATERAL    . THYROIDECTOMY, PARTIAL    . TRANSFORAMINAL LUMBAR INTERBODY FUSION (TLIF) WITH PEDICLE SCREW FIXATION 2 LEVEL N/A 01/23/2016   Procedure: TRANSFORAMINAL LUMBAR INTERBODY FUSION (TLIF) WITH PEDICLE SCREW FIXATION 2 LEVEL WITH HARDWARE REMOVAL AND EXTENSION OF HARDWARE.;  Surgeon: Kerrin ChampagneJames E Nitka, MD;  Location: [facility] OR;  Service: Orthopedics;  Laterality: N/A;  L1-L3     Family History  Problem Relation Age of Onset  . Ovarian cancer Mother   . Heart disease Mother   . Prostate cancer Father   . Heart disease Father   . Colon cancer Father   . Breast cancer Sister   . Prostate cancer Brother   . Cystic fibrosis Sister   . Heart disease Sister   . Pancreatic cancer Neg Hx   . Rectal cancer Neg Hx   .  Stomach cancer Neg Hx     Social History   Tobacco Use  . Smoking status: Never Smoker  . Smokeless tobacco: Never Used  Substance Use Topics  . Alcohol use: No    Alcohol/week: 0.0 oz    Subjective:  Patient presents with concerns for chronic nausea; notes that symptoms present x "years;" States that 15+ years ago, there was discussion of removing her gallbladder due to the persisting nausea but she opted against surgery;  went to ER last summer with these symptoms- had normal abd/pelvic CT and was told to see her GI in follow-up; she notes she opted not to see her GI in  August as scheduled because she is "just sick of going to the doctor and nothing being found." Admits she is "very frustrated" with the chronic nausea; has documented GERD but does not feel that Prilosec is helping; is adamant that she will not take any medication for her reflux at this time; last endoscopy done in 2015 and told that "had a little sore." Patient is adamant that she will not get colonoscopy- states that home health ordered Colguard yesterday;  She does mention strong FH of "cancer"- notes that mother had ovarian cancer; on recent pelvic CT, ovaries were "unremarkable." Has had hysterectomy;  Mammogram done in 04/2016;   Objective:  Vitals:   06/04/17 0954  BP: (!) 156/86  Pulse: 69  Temp: 97.6 F (36.4 C)  TempSrc: Oral  SpO2: 99%  Weight: 259 lb (117.5 kg)  Height: 5\' 3"  (1.6 m)    General: Well developed, well nourished, in no acute distress  Skin : Warm and dry.  Head: Normocephalic and atraumatic  Eyes: Sclera and conjunctiva clear; pupils round and reactive to light; extraocular movements intact  Ears: External normal; canals clear; tympanic membranes normal  Oropharynx: Pink, supple. No suspicious lesions  Neck: Supple without thyromegaly, adenopathy  Lungs: Respirations unlabored; clear to auscultation bilaterally without wheeze, rales, rhonchi  CVS exam: normal rate and regular rhythm.  Abdomen: Soft; nontender; nondistended; normoactive bowel sounds; no masses or hepatosplenomegaly  Neurologic: Alert and oriented; speech intact; face symmetrical; moves all extremities well; CNII-XII intact without focal deficit  Assessment:  1. Chronic nausea     Plan:  It is difficult to completely understand what the patient wants from our office today ; she repeatedly states she is "just done with going to the doctor" and then will change to stating she would prefer to just see her GI rather than have me or this office do anything for her; she defers any type of imaging (  discussed abdominal ultrasound, HIDA scan); she defers any medications of any type; she does finally agree to updating labs and seeing her GI in follow-up. She notes she plans to walk up to her GI office today and get the appointment scheduled.  She will plan to keep her follow-up to establish with Ashleigh in April 2019 for new PCP.   Spent 25+ minutes with patient; > 50% in counseling and discussing treatment options for her chronic nausea.   No Follow-up on file.  Orders Placed This Encounter  Procedures  . CBC w/Diff    Standing Status:   Future    Number of Occurrences:   1    Standing Expiration Date:   06/04/2018  . Comprehensive metabolic panel    Standing Status:   Future    Number of Occurrences:   1    Standing Expiration Date:   06/04/2018  .  Amylase    Standing Status:   Future    Number of Occurrences:   1    Standing Expiration Date:   06/04/2018  . Lipase    Standing Status:   Future    Number of Occurrences:   1    Standing Expiration Date:   06/04/2018  . CA 125    Standing Status:   Future    Number of Occurrences:   1    Standing Expiration Date:   06/04/2018  . Ambulatory referral to Gastroenterology    Referral Priority:   Routine    Referral Type:   Consultation    Referral Reason:   Specialty Services Required    Referred to Provider:   Rachael Fee, MD    Number of Visits Requested:   1    Requested Prescriptions    No prescriptions requested or ordered in this encounter

## 2017-06-05 ENCOUNTER — Telehealth: Payer: Self-pay | Admitting: Nurse Practitioner

## 2017-06-05 ENCOUNTER — Other Ambulatory Visit (INDEPENDENT_AMBULATORY_CARE_PROVIDER_SITE_OTHER): Payer: Self-pay | Admitting: Specialist

## 2017-06-05 DIAGNOSIS — M47816 Spondylosis without myelopathy or radiculopathy, lumbar region: Secondary | ICD-10-CM

## 2017-06-05 LAB — CA 125: CA 125: 4 U/mL (ref <35)

## 2017-06-05 NOTE — Telephone Encounter (Signed)
Patient called asking for a refill on her naproxen, states she's in a lot of pain and would like something to hold her over til her next appointment in May. CB # [phone]

## 2017-06-05 NOTE — Telephone Encounter (Signed)
Patient had questions regarding her labs. I was able to give her the llipase level and the readings. She wanted to know if there was something she could do regarding her levels: if there was something she could eat or not eat or if you could recommend anything at all. Please advise and I will call her back tomorrow. I did reiterate the importance of getting her MRI done as well.

## 2017-06-05 NOTE — Telephone Encounter (Signed)
Copied from CRM [id number]. Topic: Quick Communication - See Telephone Encounter >> Jun 05, 2017  4:11 PM Floria RavelingStovall, Shana A wrote: CRM for notification. See Telephone encounter for: pt called in and has some questions about her lab work that she had yesterday.  She would like nurse to call her   Best number [phone]  06/05/17.

## 2017-06-05 NOTE — Addendum Note (Signed)
Addended by: Penne LashSHUE WILLS, Otis DialsHRISTY N on: 06/05/2017 02:44 PM   Modules accepted: Orders

## 2017-06-06 ENCOUNTER — Other Ambulatory Visit: Payer: Self-pay | Admitting: Family

## 2017-06-06 DIAGNOSIS — K862 Cyst of pancreas: Secondary | ICD-10-CM

## 2017-06-06 MED ORDER — NAPROXEN 500 MG PO TABS: 500.0000 mg | ORAL_TABLET | Freq: Two times a day (BID) | ORAL | 2 refills | Status: DC

## 2017-06-06 NOTE — Telephone Encounter (Signed)
Spoke with patient and info given. [name] will plan to have MRI and keep apt with Dr. Christella HartiganJacobs in April. Gave her apt info with Dr. Christella HartiganJacobs today as well.

## 2017-06-06 NOTE — Telephone Encounter (Signed)
[facility]

## 2017-06-06 NOTE — Telephone Encounter (Signed)
At this time, I don't have anything to tell her to do to help with the levels; keep the planned follow-up for MRI and GI; should be contacted about MRI.

## 2017-06-13 ENCOUNTER — Telehealth (INDEPENDENT_AMBULATORY_CARE_PROVIDER_SITE_OTHER): Payer: Self-pay | Admitting: Specialist

## 2017-06-13 NOTE — Telephone Encounter (Signed)
Placed on cancellation list 

## 2017-06-13 NOTE — Telephone Encounter (Signed)
Patient would like to be added to cancellation list if anything opens sooner than 3/21.

## 2017-06-16 ENCOUNTER — Telehealth (INDEPENDENT_AMBULATORY_CARE_PROVIDER_SITE_OTHER): Payer: Self-pay | Admitting: Specialist

## 2017-06-16 NOTE — Telephone Encounter (Signed)
Patient requesting call from Dr. Otelia SergeantNitka when possible. # [phone]

## 2017-06-16 NOTE — Telephone Encounter (Signed)
I called and worked patient in on 06/18/17

## 2017-06-17 ENCOUNTER — Encounter (INDEPENDENT_AMBULATORY_CARE_PROVIDER_SITE_OTHER): Payer: Self-pay | Admitting: Specialist

## 2017-06-17 ENCOUNTER — Ambulatory Visit (INDEPENDENT_AMBULATORY_CARE_PROVIDER_SITE_OTHER): Payer: Medicare Other

## 2017-06-17 ENCOUNTER — Ambulatory Visit (INDEPENDENT_AMBULATORY_CARE_PROVIDER_SITE_OTHER): Payer: Medicare Other | Admitting: Specialist

## 2017-06-17 VITALS — BP 146/89 | HR 64 | Ht 63.0 in | Wt 253.0 lb

## 2017-06-17 DIAGNOSIS — Z981 Arthrodesis status: Secondary | ICD-10-CM | POA: Diagnosis not present

## 2017-06-17 DIAGNOSIS — M5442 Lumbago with sciatica, left side: Secondary | ICD-10-CM

## 2017-06-17 DIAGNOSIS — M5136 Other intervertebral disc degeneration, lumbar region: Secondary | ICD-10-CM

## 2017-06-17 DIAGNOSIS — G8929 Other chronic pain: Secondary | ICD-10-CM | POA: Diagnosis not present

## 2017-06-17 NOTE — Patient Instructions (Signed)
Avoid frequent bending and stooping  No lifting greater than 10 lbs. May use ice or moist heat for pain. Weight loss is of benefit. Handicap license is approved.  Scoliosis xrays to evaluation spinal alignment. Go to PT for assessment of the TENS unit function and core strengthenening of the Thoracolumbar spine. Naprosyn for pain

## 2017-06-17 NOTE — Addendum Note (Signed)
Addended by: Penne LashSHUE WILLS, Neysa BonitoHRISTY N on: 06/17/2017 03:07 PM   Modules accepted: Orders

## 2017-06-17 NOTE — Progress Notes (Addendum)
Office Visit Note   Patient: [name]           Date of Birth: December 25, 1945           MRN: [id number] Visit Date: 06/17/2017              Requested by: Anne Ng, NP [address] [place], [place] [address] PCP: Anne Ng, NP   Assessment & Plan: Visit Diagnoses:  1. Chronic midline low back pain with left-sided sciatica   2. Degenerative disc disease, lumbar   3. 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 Her only remaining open segments are T12-L1 and T11-12. Her most recent myelogram and post myelogram shows disc protrusions T12-L1 greater than T11-12. The MRI from 10/2016 shows the T12-L1 disc to be herniated centrally. The only surgery that may be of benefit would be to fuse the last remaining segments at T11-12 and T12-L1 and consider TLIF at T12-L1. Her exam with no focal deficit. The surgery would be for pain. She does not wish any Further injection treatments, facet injections, SI injection s or ESIs. I will have her try a TENS and have her current unit assess for functionality and try T-L stabilization exercises. Obtain a true scoliosis xray set showing the cervical spine to pelvis and his assessing  For forward alignment in the sagital plane and for assessment if surgical considerations become necessary. Naprosyn for pain, last Cr 1.06 normal 05/2017. Hot showers. May use her T-L brace intermittantly to decrease the stress at the upper lumbar levels that are remaining open.  Plan:Avoid frequent bending and stooping  No lifting greater than 10 lbs. May use ice or moist heat for pain. Weight loss is of benefit. Handicap license is approved.  Scoliosis xrays to evaluation spinal alignment. Go to PT for assessment of the TENS unit function and core strengthenening of the Thoracolumbar spine. Naprosyn for pain  Follow-Up Instructions:  Return in about 6 weeks (around 07/29/2017).   Orders:  Orders Placed This Encounter  Procedures  . XR SCOLIOSIS EVAL COMPLETE SPINE 2 OR 3 VIEWS   No orders of the defined types were placed in this encounter.     Procedures: No procedures performed   Clinical Data: No additional findings.   Subjective: Chief Complaint  Patient presents with  . Lower Back - Follow-up, Pain    72 year old female with history of lumbar degenerative disc disease and spinal stenosis post extension of her fusion 9/17 to the L2 level. She is experiencing pain with standing and walking. She has pain with sitting, she sitting on the pillow helps. She points to the upper lumbar level as the area of severe pain. She sits on one pillow and applies a pillow to her back. She is going to the bathroom frequently, when ever I drink a glass of water she has to run to the bathroom, uses lactate milk to decrease gas and eats plenty of ruffage. Bending is painful but she tries to  Stay mobile. She avoids sweeping and mopping, does not move furniture.     Review of Systems  Constitutional: Negative.   HENT: Negative.   Eyes: Negative.   Respiratory: Negative.   Cardiovascular: Negative.   Gastrointestinal: Negative.   Endocrine: Negative.   Genitourinary: Negative.   Musculoskeletal: Negative.   Skin: Negative.   Allergic/Immunologic: Negative.  Neurological: Negative.   Hematological: Negative.   Psychiatric/Behavioral: Negative.      Objective: Vital Signs: BP (!) 146/89 (BP Location: Left Arm, Patient Position: Sitting)   Pulse 64   Ht 5\' 3"  (1.6 m)   Wt 253 lb (114.8 kg)   BMI 44.82 kg/m   Physical Exam  Constitutional: She is oriented to person, place, and time. She appears well-developed and well-nourished.  HENT:  Head: Normocephalic and atraumatic.  Eyes: EOM are normal. Pupils are equal, round, and reactive to light.  Neck: Normal range of motion. Neck supple.  Pulmonary/Chest:  Effort normal and breath sounds normal.  Abdominal: Soft. Bowel sounds are normal.  Neurological: She is alert and oriented to person, place, and time.  Skin: Skin is warm and dry.  Psychiatric: She has a normal mood and affect. Her behavior is normal. Judgment and thought content normal.    Back Exam   Tenderness  The patient is experiencing tenderness in the lumbar.  Range of Motion  Extension: abnormal  Flexion: abnormal  Lateral bend right: normal  Lateral bend left: normal  Rotation right: normal  Rotation left: normal   Muscle Strength  Right Quadriceps:  5/5  Left Quadriceps:  5/5  Right Hamstrings:  5/5  Left Hamstrings:  5/5   Tests  Straight leg raise right: negative Straight leg raise left: negative  Reflexes  Patellar: Hyporeflexic Achilles: Hyporeflexic Biceps: 2/4 Babinski's sign: normal   Other  Toe walk: abnormal Heel walk: abnormal Sensation: normal Gait: normal  Erythema: no back redness Scars: absent  Comments:  She stands with stooped posture      Specialty Comments:  No specialty comments available.  Imaging: No results found.   PMFS History: Patient Active Problem List   Diagnosis Date Noted  . Spinal stenosis, lumbar region, with neurogenic claudication 01/23/2016    Priority: High    Class: Chronic  . Other secondary scoliosis, 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    Priority: High    Class: Chronic  . HNP (herniated nucleus pulposus), cervical 07/20/2012    Priority: High    Class: Acute  . Cervical spondylosis without myelopathy 07/20/2012    Priority: High    Class: Chronic  . S/P partial thyroidectomy 08/23/2016  . Spondylolisthesis of 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  . Diastolic dysfunction   . Hypokalemia 03/08/2015  . Bradycardia 03/08/2015  . Nausea and vomiting 03/03/2015  . Syncope and collapse 03/02/2015  . Pancreatic cyst 02/28/2015  . Venous (peripheral) insufficiency 06/08/2010  . Asymptomatic varicose veins  06/07/2010  . HYPERLIPIDEMIA 04/16/2010  . Essential hypertension 04/16/2010  . GERD 04/16/2010  . HIATAL HERNIA 04/16/2010  . OSTEOARTHRITIS 04/16/2010  . DYSPHAGIA UNSPECIFIED 04/16/2010  . Atrophic vaginitis 03/31/2009   Past Medical History:  Diagnosis Date  . Allergy   . Arthritis   . Cataract of right eye   . Diastolic dysfunction   . Dizzy   . GERD (gastroesophageal reflux disease)   . Hyperlipidemia   . Hypertension   . Pancreatic cyst 02/2015   Recommend follow-up MRI in 12 months.    Family History  Problem Relation Age of Onset  . Ovarian cancer Mother   . Heart disease Mother   . Prostate cancer Father   . Heart disease Father   . Colon cancer Father   . Breast cancer Sister   . Prostate cancer Brother   . Cystic fibrosis Sister   . Heart disease Sister   . Pancreatic cancer Neg Hx   .  Rectal cancer Neg Hx   . Stomach cancer Neg Hx     Past Surgical History:  Procedure Laterality Date  . ABDOMINAL HYSTERECTOMY    . ANTERIOR CERVICAL DECOMP/DISCECTOMY FUSION N/A 07/20/2012   Procedure: ANTERIOR CERVICAL DISCECTOMY FUSION C5-6, C6-7 with transgraft(Alphatec), local bone graft, plate and screws ;  Surgeon: Kerrin Champagne, MD;  Location: [facility] OR;  Service: Orthopedics;  Laterality: N/A;  . BACK SURGERY    . COLONOSCOPY    . ESOPHAGOGASTRODUODENOSCOPY    . NECK SURGERY    . REPLACEMENT TOTAL KNEE BILATERAL    . THYROIDECTOMY, PARTIAL    . TRANSFORAMINAL LUMBAR INTERBODY FUSION (TLIF) WITH PEDICLE SCREW FIXATION 2 LEVEL N/A 01/23/2016   Procedure: TRANSFORAMINAL LUMBAR INTERBODY FUSION (TLIF) WITH PEDICLE SCREW FIXATION 2 LEVEL WITH HARDWARE REMOVAL AND EXTENSION OF HARDWARE.;  Surgeon: Kerrin Champagne, MD;  Location: [facility] OR;  Service: Orthopedics;  Laterality: N/A;  L1-L3    Social History   Occupational History  . Occupation: Retired  Tobacco Use  . Smoking status: Never Smoker  . Smokeless tobacco: Never Used  Substance and Sexual Activity  . Alcohol use:  No    Alcohol/week: 0.0 oz  . Drug use: No  . Sexual activity: Not on file

## 2017-06-18 ENCOUNTER — Ambulatory Visit (INDEPENDENT_AMBULATORY_CARE_PROVIDER_SITE_OTHER): Payer: Medicare Other | Admitting: Specialist

## 2017-06-20 ENCOUNTER — Other Ambulatory Visit: Payer: Self-pay | Admitting: Family

## 2017-06-23 ENCOUNTER — Other Ambulatory Visit (INDEPENDENT_AMBULATORY_CARE_PROVIDER_SITE_OTHER): Payer: Self-pay | Admitting: Specialist

## 2017-06-23 DIAGNOSIS — M47816 Spondylosis without myelopathy or radiculopathy, lumbar region: Secondary | ICD-10-CM

## 2017-06-24 NOTE — Telephone Encounter (Signed)
Naproxen refill request 

## 2017-06-26 ENCOUNTER — Telehealth (INDEPENDENT_AMBULATORY_CARE_PROVIDER_SITE_OTHER): Payer: Self-pay | Admitting: Specialist

## 2017-06-26 NOTE — Telephone Encounter (Signed)
Patient called really eager to set up her MRI for her back, if you could give her a call back at [phone]

## 2017-06-27 NOTE — Telephone Encounter (Signed)
I called pt back and advised her doctor did not put order in for MRI but was wanting pt to go get xray Scoliosis xrays to evaluation spinal alignment. Pt aware can call imaging to set up appt or can go as a walkin. Pt will call to schedule

## 2017-06-30 ENCOUNTER — Inpatient Hospital Stay: Admission: RE | Admit: 2017-06-30 | Payer: Medicare Other | Source: Ambulatory Visit

## 2017-07-01 ENCOUNTER — Other Ambulatory Visit: Payer: Self-pay | Admitting: Nurse Practitioner

## 2017-07-01 DIAGNOSIS — K219 Gastro-esophageal reflux disease without esophagitis: Secondary | ICD-10-CM

## 2017-07-02 ENCOUNTER — Other Ambulatory Visit (INDEPENDENT_AMBULATORY_CARE_PROVIDER_SITE_OTHER): Payer: Self-pay | Admitting: Specialist

## 2017-07-02 ENCOUNTER — Other Ambulatory Visit: Payer: Self-pay | Admitting: Internal Medicine

## 2017-07-02 ENCOUNTER — Ambulatory Visit
Admission: RE | Admit: 2017-07-02 | Discharge: 2017-07-02 | Disposition: A | Discharge status: Home / Self Care | Payer: Medicare Other | Source: Ambulatory Visit | Attending: Specialist | Admitting: Specialist

## 2017-07-02 DIAGNOSIS — G8929 Other chronic pain: Secondary | ICD-10-CM

## 2017-07-02 DIAGNOSIS — M5442 Lumbago with sciatica, left side: Principal | ICD-10-CM

## 2017-07-02 DIAGNOSIS — Z981 Arthrodesis status: Secondary | ICD-10-CM

## 2017-07-02 DIAGNOSIS — M5136 Other intervertebral disc degeneration, lumbar region: Secondary | ICD-10-CM

## 2017-07-03 NOTE — Progress Notes (Signed)
appt on 07/31/2017

## 2017-07-07 ENCOUNTER — Other Ambulatory Visit: Payer: Self-pay | Admitting: Nurse Practitioner

## 2017-07-07 DIAGNOSIS — Z1231 Encounter for screening mammogram for malignant neoplasm of breast: Secondary | ICD-10-CM

## 2017-07-09 ENCOUNTER — Telehealth (INDEPENDENT_AMBULATORY_CARE_PROVIDER_SITE_OTHER): Payer: Self-pay | Admitting: Specialist

## 2017-07-09 NOTE — Telephone Encounter (Signed)
Patient called wanting to let Dr Otelia SergeantNitka know that she is hurting so bad and can not take the pain any more. Patient asked if she can get to see Dr Otelia SergeantNitka sooner. Patient was very upset and crying. Patient said she is willing to get scheduled for surgery when she see Dr. Otelia SergeantNitka.  Patient asked if there is anything else she can take other than Hydrocodone because it make her sleep all the time. The number to contact patient is [phone]

## 2017-07-09 NOTE — Telephone Encounter (Signed)
Worked her in on 07/10/2017

## 2017-07-10 ENCOUNTER — Ambulatory Visit (INDEPENDENT_AMBULATORY_CARE_PROVIDER_SITE_OTHER): Payer: Medicare Other | Admitting: Specialist

## 2017-07-17 ENCOUNTER — Ambulatory Visit (INDEPENDENT_AMBULATORY_CARE_PROVIDER_SITE_OTHER): Payer: Medicare Other | Admitting: Specialist

## 2017-07-19 ENCOUNTER — Emergency Department (HOSPITAL_COMMUNITY)
Admission: EM | Admit: 2017-07-19 | Discharge: 2017-07-19 | Disposition: A | Discharge status: Home / Self Care | Payer: Medicare Other | Attending: Emergency Medicine | Admitting: Emergency Medicine

## 2017-07-19 ENCOUNTER — Encounter (HOSPITAL_COMMUNITY): Payer: Self-pay | Admitting: Emergency Medicine

## 2017-07-19 ENCOUNTER — Other Ambulatory Visit: Payer: Self-pay

## 2017-07-19 DIAGNOSIS — Z96653 Presence of artificial knee joint, bilateral: Secondary | ICD-10-CM | POA: Diagnosis not present

## 2017-07-19 DIAGNOSIS — I5030 Unspecified diastolic (congestive) heart failure: Secondary | ICD-10-CM | POA: Diagnosis not present

## 2017-07-19 DIAGNOSIS — Z79899 Other long term (current) drug therapy: Secondary | ICD-10-CM | POA: Diagnosis not present

## 2017-07-19 DIAGNOSIS — R197 Diarrhea, unspecified: Secondary | ICD-10-CM | POA: Diagnosis not present

## 2017-07-19 DIAGNOSIS — Z7982 Long term (current) use of aspirin: Secondary | ICD-10-CM | POA: Insufficient documentation

## 2017-07-19 DIAGNOSIS — R112 Nausea with vomiting, unspecified: Secondary | ICD-10-CM | POA: Diagnosis not present

## 2017-07-19 DIAGNOSIS — I110 Hypertensive heart disease with heart failure: Secondary | ICD-10-CM | POA: Diagnosis not present

## 2017-07-19 LAB — COMPREHENSIVE METABOLIC PANEL
ALT: 11 U/L — ABNORMAL LOW (ref 14–54)
ANION GAP: 12 (ref 5–15)
AST: 20 U/L (ref 15–41)
Albumin: 4.1 g/dL (ref 3.5–5.0)
Alkaline Phosphatase: 63 U/L (ref 38–126)
BILIRUBIN TOTAL: 0.8 mg/dL (ref 0.3–1.2)
BUN: 13 mg/dL (ref 6–20)
CHLORIDE: 105 mmol/L (ref 101–111)
CO2: 26 mmol/L (ref 22–32)
Calcium: 9.3 mg/dL (ref 8.9–10.3)
Creatinine, Ser: 1.04 mg/dL — ABNORMAL HIGH (ref 0.44–1.00)
GFR, EST NON AFRICAN AMERICAN: 53 mL/min — AB (ref >60)
Glucose, Bld: 108 mg/dL — ABNORMAL HIGH (ref 65–99)
POTASSIUM: 3.9 mmol/L (ref 3.5–5.1)
Sodium: 143 mmol/L (ref 135–145)
TOTAL PROTEIN: 7.8 g/dL (ref 6.5–8.1)

## 2017-07-19 LAB — LIPASE, BLOOD: LIPASE: 79 U/L — AB (ref 11–51)

## 2017-07-19 LAB — CBC
HEMATOCRIT: 40.3 % (ref 36.0–46.0)
Hemoglobin: 13.2 g/dL (ref 12.0–15.0)
MCH: 25.9 pg — ABNORMAL LOW (ref 26.0–34.0)
MCHC: 32.8 g/dL (ref 30.0–36.0)
MCV: 79 fL (ref 78.0–100.0)
Platelets: 236 10*3/uL (ref 150–400)
RBC: 5.1 MIL/uL (ref 3.87–5.11)
RDW: 15.9 % — AB (ref 11.5–15.5)
WBC: 4.4 10*3/uL (ref 4.0–10.5)

## 2017-07-19 LAB — URINALYSIS, ROUTINE W REFLEX MICROSCOPIC
Bilirubin Urine: NEGATIVE
Glucose, UA: NEGATIVE mg/dL
Hgb urine dipstick: NEGATIVE
KETONES UR: NEGATIVE mg/dL
LEUKOCYTES UA: NEGATIVE
Nitrite: NEGATIVE
PH: 7 (ref 5.0–8.0)
Protein, ur: NEGATIVE mg/dL
Specific Gravity, Urine: 1.008 (ref 1.005–1.030)

## 2017-07-19 MED ORDER — ONDANSETRON 4 MG PO TBDP: ORAL_TABLET | ORAL | 0 refills | Status: DC

## 2017-07-19 MED ORDER — ONDANSETRON 4 MG PO TBDP
4.0000 mg | ORAL_TABLET | Freq: Once | ORAL | Status: AC | PRN
  Administered 2017-07-19: 4 mg via ORAL
  Filled 2017-07-19: qty 1

## 2017-07-19 MED ORDER — ONDANSETRON 4 MG PO TBDP
4.0000 mg | ORAL_TABLET | Freq: Once | ORAL | Status: AC
  Administered 2017-07-19: 4 mg via ORAL
  Filled 2017-07-19: qty 1

## 2017-07-19 NOTE — ED Provider Notes (Signed)
[facility]-EMERGENCY DEPT Provider Note   CSN: [id number] Arrival date & time: 07/19/17  1419     History   Chief Complaint Chief Complaint  Patient presents with  . Nausea  . Emesis  . Diarrhea    HPI [name] is a 72 y.o. female.  72 yo F with a chief complaint of nausea vomiting diarrhea.  The patient thinks is related to eating some spoiled chicken.  This started yesterday.  She has had no continued events since about 6 hours ago.  She has been able to tolerate liquids.  She denies fevers.  Has some mild abdominal cramping prior to having a bowel movement or vomiting.  She denies blood in her emesis or stool.  Denies fevers or chills.  Denies sick contacts.  The history is provided by the patient.  Emesis   This is a new problem. The current episode started yesterday. The problem has not changed since onset.There has been no fever. The fever has been present for 1 to 2 days. Associated symptoms include diarrhea. Pertinent negatives include no abdominal pain (cramping prior to BM), no arthralgias, no chills, no fever, no headaches and no myalgias.  Diarrhea   Associated symptoms include vomiting. Pertinent negatives include no abdominal pain (cramping prior to BM), no chills, no headaches, no arthralgias and no myalgias.  Illness  This is a new problem. The current episode started yesterday. The problem occurs constantly. The problem has been resolved. Pertinent negatives include no chest pain, no abdominal pain (cramping prior to BM), no headaches and no shortness of breath. Nothing aggravates the symptoms. Nothing relieves the symptoms. She has tried nothing for the symptoms. The treatment provided no relief.    Past Medical History:  Diagnosis Date  . Allergy   . Arthritis   . Cataract of right eye   . Diastolic dysfunction   . Dizzy   . GERD (gastroesophageal reflux disease)   . Hyperlipidemia   . Hypertension   . Pancreatic cyst 02/2015     Recommend follow-up MRI in 12 months.    Patient Active Problem List   Diagnosis Date Noted  . S/P partial thyroidectomy 08/23/2016  . Spinal stenosis, lumbar region, with neurogenic claudication 01/23/2016    Class: Chronic  . Other secondary scoliosis, 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    Class: Chronic  . Spondylolisthesis of 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  . Diastolic dysfunction   . Hypokalemia 03/08/2015  . Bradycardia 03/08/2015  . Nausea and vomiting 03/03/2015  . Syncope and collapse 03/02/2015  . Pancreatic cyst 02/28/2015  . HNP (herniated nucleus pulposus), cervical 07/20/2012    Class: Acute  . Cervical spondylosis without myelopathy 07/20/2012    Class: Chronic  . Venous (peripheral) insufficiency 06/08/2010  . Asymptomatic varicose veins 06/07/2010  . HYPERLIPIDEMIA 04/16/2010  . Essential hypertension 04/16/2010  . GERD 04/16/2010  . HIATAL HERNIA 04/16/2010  . OSTEOARTHRITIS 04/16/2010  . DYSPHAGIA UNSPECIFIED 04/16/2010  . Atrophic vaginitis 03/31/2009    Past Surgical History:  Procedure Laterality Date  . ABDOMINAL HYSTERECTOMY    . ANTERIOR CERVICAL DECOMP/DISCECTOMY FUSION N/A 07/20/2012   Procedure: ANTERIOR CERVICAL DISCECTOMY FUSION C5-6, C6-7 with transgraft(Alphatec), local bone graft, plate and screws ;  Surgeon: Kerrin ChampagneJames E Nitka, MD;  Location: [facility] OR;  Service: Orthopedics;  Laterality: N/A;  . BACK SURGERY    . COLONOSCOPY    . ESOPHAGOGASTRODUODENOSCOPY    . NECK SURGERY    . REPLACEMENT TOTAL KNEE BILATERAL    . THYROIDECTOMY,  PARTIAL    . TRANSFORAMINAL LUMBAR INTERBODY FUSION (TLIF) WITH PEDICLE SCREW FIXATION 2 LEVEL N/A 01/23/2016   Procedure: TRANSFORAMINAL LUMBAR INTERBODY FUSION (TLIF) WITH PEDICLE SCREW FIXATION 2 LEVEL WITH HARDWARE REMOVAL AND EXTENSION OF HARDWARE.;  Surgeon: Kerrin Champagne, MD;  Location: [facility] OR;  Service: Orthopedics;  Laterality: N/A;  L1-L3      OB History   None      Home Medications    Prior to Admission  medications   Medication Sig Start Date End Date Taking? Authorizing Provider  amLODipine (NORVASC) 5 MG tablet TAKE 1 TABLET BY MOUTH AT  BEDTIME 06/03/17   Nche, Bonna Gains, NP  aspirin EC 81 MG tablet Take 81 mg by mouth daily.    [provider]  busPIRone (BUSPAR) 5 MG tablet Take 1 tablet (5 mg total) by mouth 2 (two) times daily. 02/03/17   Nche, Bonna Gains, NP  Multiple Vitamin (MULTIVITAMIN WITH MINERALS) TABS tablet Take 1 tablet by mouth daily.    [provider]  naproxen (NAPROSYN) 500 MG tablet TAKE 1 TABLET BY MOUTH TWO  TIMES DAILY WITH A MEAL 06/25/17   Kerrin Champagne, MD  omeprazole (PRILOSEC) 20 MG capsule Take 1 capsule (20 mg total) by mouth daily. Need Office visit for further refills 07/01/17   Nche, Bonna Gains, NP  ondansetron (ZOFRAN ODT) 4 MG disintegrating tablet 4mg  ODT q4 hours prn nausea/vomit 07/19/17   Melene Plan, DO  ranitidine (ZANTAC) 300 MG tablet Take 1 tablet (300 mg total) by mouth at bedtime. Need Office visit for further refills 07/01/17   Nche, Bonna Gains, NP  simvastatin (ZOCOR) 20 MG tablet TAKE 1 TABLET BY MOUTH  DAILY AT 6 PM. 06/03/17   Nche, Bonna Gains, NP    Family History Family History  Problem Relation Age of Onset  . Ovarian cancer Mother   . Heart disease Mother   . Prostate cancer Father   . Heart disease Father   . Colon cancer Father   . Breast cancer Sister   . Prostate cancer Brother   . Cystic fibrosis Sister   . Heart disease Sister   . Pancreatic cancer Neg Hx   . Rectal cancer Neg Hx   . Stomach cancer Neg Hx     Social History Social History   Tobacco Use  . Smoking status: Never Smoker  . Smokeless tobacco: Never Used  Substance Use Topics  . Alcohol use: No    Alcohol/week: 0.0 oz  . Drug use: No     Allergies   Lisinopril; Penicillins; and Tramadol hcl   Review of Systems Review of Systems  Constitutional: Negative for chills and fever.  HENT: Negative for congestion and  rhinorrhea.   Eyes: Negative for redness and visual disturbance.  Respiratory: Negative for shortness of breath and wheezing.   Cardiovascular: Negative for chest pain and palpitations.  Gastrointestinal: Positive for diarrhea and vomiting. Negative for abdominal pain (cramping prior to BM) and nausea.  Genitourinary: Negative for dysuria and urgency.  Musculoskeletal: Negative for arthralgias and myalgias.  Skin: Negative for pallor and wound.  Neurological: Negative for dizziness and headaches.     Physical Exam Updated Vital Signs BP (!) 156/105 (BP Location: Right Arm)   Pulse 73   Temp 98.6 F (37 C) (Oral)   Resp 18   SpO2 100%   Physical Exam  Constitutional: She is oriented to person, place, and time. She appears well-developed and well-nourished. No distress.  HENT:  Head: Normocephalic and atraumatic.  Eyes: Pupils are equal, round, and reactive to light. EOM are normal.  Neck: Normal range of motion. Neck supple.  Cardiovascular: Normal rate and regular rhythm. Exam reveals no gallop and no friction rub.  No murmur heard. Pulmonary/Chest: Effort normal. She has no wheezes. She has no rales.  Abdominal: Soft. She exhibits no distension and no mass. There is no tenderness. There is no guarding.  obese  Musculoskeletal: She exhibits no edema or tenderness.  Neurological: She is alert and oriented to person, place, and time.  Skin: Skin is warm and dry. She is not diaphoretic.  Psychiatric: She has a normal mood and affect. Her behavior is normal.  Nursing note and vitals reviewed.    ED Treatments / Results  Labs (all labs ordered are listed, but only abnormal results are displayed) Labs Reviewed  LIPASE, BLOOD - Abnormal; Notable for the following components:      Result Value   Lipase 79 (*)    All other components within normal limits  COMPREHENSIVE METABOLIC PANEL - Abnormal; Notable for the following components:   Glucose, Bld 108 (*)    Creatinine, Ser  1.04 (*)    ALT 11 (*)    GFR calc non Af Amer 53 (*)    All other components within normal limits  CBC - Abnormal; Notable for the following components:   MCH 25.9 (*)    RDW 15.9 (*)    All other components within normal limits  URINALYSIS, ROUTINE W REFLEX MICROSCOPIC    EKG None  Radiology No results found.  Procedures Procedures (including critical care time)  Medications Ordered in ED Medications  ondansetron (ZOFRAN-ODT) disintegrating tablet 4 mg (has no administration in time range)  ondansetron (ZOFRAN-ODT) disintegrating tablet 4 mg (4 mg Oral Given 07/19/17 1434)     Initial Impression / Assessment and Plan / ED Course  I have reviewed the triage vital signs and the nursing notes.  Pertinent labs & imaging results that were available during my care of the patient were reviewed by me and considered in my medical decision making (see chart for details).     72 yo F with a chief complaint of nausea vomiting diarrhea.  Patient is well-appearing and nontoxic.  She has no abdominal tenderness on my exam.  She is tolerating p.o. in the ED.  Her labs are reassuring.  Discharge home.  6:54 PM:  I have discussed the diagnosis/risks/treatment options with the patient and family and believe the pt to be eligible for discharge home to follow-up with PCP, GI. We also discussed returning to the ED immediately if new or worsening sx occur. We discussed the sx which are most concerning (e.g., sudden worsening pain, fever, inability to tolerate by mouth) that necessitate immediate return. Medications administered to the patient during their visit and any new prescriptions provided to the patient are listed below.  Medications given during this visit Medications  ondansetron (ZOFRAN-ODT) disintegrating tablet 4 mg (has no administration in time range)  ondansetron (ZOFRAN-ODT) disintegrating tablet 4 mg (4 mg Oral Given 07/19/17 1434)     The patient appears reasonably screen  and/or stabilized for discharge and I doubt any other medical condition or other [facility] requiring further screening, evaluation, or treatment in the ED at this time prior to discharge.    Final Clinical Impressions(s) / ED Diagnoses   Final diagnoses:  Nausea vomiting and diarrhea    ED Discharge Orders  Ordered    ondansetron (ZOFRAN ODT) 4 MG disintegrating tablet     07/19/17 1850       Melene Plan, DO 07/19/17 1854

## 2017-07-19 NOTE — Discharge Instructions (Signed)
Try Imodium for diarrhea, return for inability to eat or drink fever or pinpoint abdominal pain.

## 2017-07-19 NOTE — ED Triage Notes (Signed)
Per EMS pt ate chicken last night that may not have been cooked. Since this am, she's had severe N/V/D.

## 2017-07-21 ENCOUNTER — Telehealth (INDEPENDENT_AMBULATORY_CARE_PROVIDER_SITE_OTHER): Payer: Self-pay | Admitting: Specialist

## 2017-07-21 NOTE — Telephone Encounter (Signed)
Please call pt to discuss pt care. Pt is in a lot of pain and ready to sched surgery.

## 2017-07-22 ENCOUNTER — Telehealth: Payer: Self-pay | Admitting: Nurse Practitioner

## 2017-07-22 NOTE — Telephone Encounter (Signed)
Copied from CRM [id number]. Topic: Quick Communication - See Telephone Encounter >> Jul 22, 2017  1:01 PM Guinevere FerrariMorris, Narely Nobles E, NT wrote: CRM for notification. See Telephone encounter for: 07/22/17. Patient called and said that she have been taking  amLODipine (NORVASC) 5 MG tablet and is not feeling like her self. Patient requested that she go back to taking the medication she was taking previously. Patient doesn't remember name of medication and use [facility] [id number] [place] - [address] [phone] (Phone) [phone] (Fax)

## 2017-07-22 NOTE — Telephone Encounter (Addendum)
Called pt regarding her previous request; she states that she has been having "chest fullness" since she started taking norvasc; and this has been occurred 1-2 times per month for  the past 2-3 months she states that she would like to be put back on losartan; pt previously seen by Alysia Pennaharlotte Nche but has upcoming appointment with Alphonse GuildAshleigh Shambley 07/28/17 at 1100; the pt states that she will call back because she has to make sure that she has transportation to this appointment; will route to office for notification of this encounter..[name]

## 2017-07-23 NOTE — Telephone Encounter (Signed)
Patient wants to go ahead and schedule for surgery for her back. She says she is tired of hurting all the time.  She is sched with her PCP on 07/28/2017 and she has an appt with us on 07/31/2017.---Please advise

## 2017-07-28 ENCOUNTER — Ambulatory Visit: Payer: Medicare Other | Admitting: Nurse Practitioner

## 2017-07-31 ENCOUNTER — Ambulatory Visit (INDEPENDENT_AMBULATORY_CARE_PROVIDER_SITE_OTHER): Payer: Medicare Other | Admitting: Specialist

## 2017-07-31 ENCOUNTER — Encounter (INDEPENDENT_AMBULATORY_CARE_PROVIDER_SITE_OTHER): Payer: Self-pay | Admitting: Specialist

## 2017-07-31 VITALS — BP 146/82 | HR 54 | Ht 63.0 in | Wt 253.0 lb

## 2017-07-31 DIAGNOSIS — M4322 Fusion of spine, cervical region: Secondary | ICD-10-CM

## 2017-07-31 DIAGNOSIS — M4814 Ankylosing hyperostosis [Forestier], thoracic region: Secondary | ICD-10-CM | POA: Diagnosis not present

## 2017-07-31 DIAGNOSIS — M4326 Fusion of spine, lumbar region: Secondary | ICD-10-CM | POA: Diagnosis not present

## 2017-07-31 DIAGNOSIS — M5125 Other intervertebral disc displacement, thoracolumbar region: Secondary | ICD-10-CM | POA: Diagnosis not present

## 2017-07-31 MED ORDER — HYDROCODONE-ACETAMINOPHEN 5-325 MG PO TABS: 1.0000 | ORAL_TABLET | Freq: Four times a day (QID) | ORAL | 0 refills | Status: DC | PRN

## 2017-07-31 NOTE — Patient Instructions (Signed)
Avoid bending, stooping and avoid lifting weights greater than 10 lbs. Avoid prolong standing and walking. Order for a new walker with wheels. Surgery scheduling secretary Tivis RingerSherri Billings, will call you in the next week to schedule for surgery.  Surgery recommended is a thre level lumbar fusion T12-L1, T11-12 and T10-11 this would be done with rods, screws, hooks and possible sublaminar wires and possible cage with local bone graft and allograft (donor bone graft). Take hydrocodone for for pain. Risk of surgery includes risk of infection 1 in 200 patients, bleeding 1/2% chance you would need a transfusion.   Risk to the nerves is one in 10,000. You will need to use a brace for 3 months and wean from the brace on the 4th month. Expect improved walking and standing tolerance. Expect relief of leg pain but numbness may persist depending on the length and degree of pressure that has been present.

## 2017-07-31 NOTE — Progress Notes (Signed)
Office Visit Note   Patient: [name]           Date of Birth: Sep 25, 1945           MRN: [id number] Visit Date: 07/31/2017              Requested by: Anne NgNche, Charlotte Lum, NP [address] [place], [place] [id number] PCP: Ardith DarkParker, Caleb M, MD   Assessment & Plan: Visit Diagnoses:  1. 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 Fusion of lumbar spine   3. Forestier's disease of thoracic region   4. Fusion of spine of cervical region     Plan: Avoid bending, stooping and avoid lifting weights greater than 10 lbs. Avoid prolong standing and walking. Order for a new walker with wheels. Surgery scheduling secretary Tivis RingerSherri Billings, will call you in the next week to schedule for surgery.  Surgery recommended is a thre level lumbar fusion T12-L1, T11-12 and T10-11 this would be done with rods, screws, hooks and possible sublaminar wires and possible cage with local bone graft and allograft (donor bone graft). Take hydrocodone for for pain. Risk of surgery includes risk of infection 1 in 200 patients, bleeding 1/2% chance you would need a transfusion.   Risk to the nerves is one in 10,000. You will need to use a brace for 3 months and wean from the brace on the 4th month. Expect improved walking and standing tolerance. Expect relief of leg pain but numbness may persist depending on the length and degree of pressure that has been present.  Follow-Up Instructions: Return in about 1 month (around 08/28/2017).   Orders:  No orders of the defined types were placed in this encounter.  No orders of the defined types were placed in this encounter.     Procedures: No procedures performed   Clinical Data: No additional findings.   Subjective: Chief Complaint  Patient presents with  . Lower Back - Follow-up    72 year old female with history of back pain and a collapsing degenerative scoliosis she has had previous lumbar laminectomies and more recently  01/23/2016 with transforamenal TLIF extension of the fusion by 2 levels to the L2 level to the L1 level. She experienced good pain relief then pain has recurred with radiation along the right iguinal area. Her pain is limiting her function and she is staying at home even on Sunday. She has to do her own work and her sister is going to help her post operatively. The results of the long cassette views of her C-T-L spine shows a well compensated sagitall curves of the spine. Myelogram shows disc herniation at T12-L1 central and right foramenal with right L1 nerve compression.     Review of Systems  Constitutional: Negative.   HENT: Negative.   Eyes: Negative.   Respiratory: Negative.   Cardiovascular: Negative.   Gastrointestinal: Negative.   Endocrine: Negative.   Genitourinary: Negative.   Musculoskeletal: Negative.   Skin: Negative.   Allergic/Immunologic: Negative.   Neurological: Negative.   Hematological: Negative.   Psychiatric/Behavioral: Negative.      Objective: Vital Signs: BP (!) 146/82 (BP Location: Left Arm, Patient Position: Sitting)   Pulse (!) 54   Ht 5\' 3"  (1.6 m)   Wt 253 lb (114.8 kg)   BMI 44.82 kg/m   Physical Exam  Constitutional: She is oriented to person, place, and time. She appears well-developed and well-nourished.  HENT:  Head: Normocephalic and atraumatic.  Eyes: Pupils  are equal, round, and reactive to light. EOM are normal.  Neck: Normal range of motion. Neck supple.  Pulmonary/Chest: Effort normal and breath sounds normal.  Abdominal: Soft. Bowel sounds are normal.  Neurological: She is alert and oriented to person, place, and time.  Skin: Skin is warm and dry.  Psychiatric: She has a normal mood and affect. Her behavior is normal. Judgment and thought content normal.    Back Exam   Tenderness  The patient is experiencing tenderness in the lumbar.  Range of Motion  Extension: abnormal  Flexion: abnormal  Lateral bend right: abnormal    Lateral bend left: abnormal  Rotation right: abnormal  Rotation left: abnormal   Muscle Strength  Right Quadriceps:  5/5  Left Quadriceps:  5/5  Right Hamstrings:  5/5  Left Hamstrings:  5/5   Tests  Straight leg raise right: negative Straight leg raise left: negative  Reflexes  Patellar: normal Achilles: normal Biceps: normal Babinski's sign: normal   Other  Toe walk: normal Heel walk: normal Sensation: normal Erythema: no back redness Scars: absent  Comments:  Weak right hip flexion 4/5 with pain       Specialty Comments:  No specialty comments available.  Imaging: No results found.   PMFS History: Patient Active Problem List   Diagnosis Date Noted  . Spinal stenosis, lumbar region, with neurogenic claudication 01/23/2016    Priority: High    Class: Chronic  . Other secondary scoliosis, 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    Priority: High    Class: Chronic  . HNP (herniated nucleus pulposus), cervical 07/20/2012    Priority: High    Class: Acute  . Cervical spondylosis without myelopathy 07/20/2012    Priority: High    Class: Chronic  . S/P partial thyroidectomy 08/23/2016  . Spondylolisthesis of 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  . Diastolic dysfunction   . Hypokalemia 03/08/2015  . Bradycardia 03/08/2015  . Nausea and vomiting 03/03/2015  . Syncope and collapse 03/02/2015  . Pancreatic cyst 02/28/2015  . Venous (peripheral) insufficiency 06/08/2010  . Asymptomatic varicose veins 06/07/2010  . HYPERLIPIDEMIA 04/16/2010  . Essential hypertension 04/16/2010  . GERD 04/16/2010  . HIATAL HERNIA 04/16/2010  . OSTEOARTHRITIS 04/16/2010  . DYSPHAGIA UNSPECIFIED 04/16/2010  . Atrophic vaginitis 03/31/2009   Past Medical History:  Diagnosis Date  . Allergy   . Arthritis   . Cataract of right eye   . Diastolic dysfunction   . Dizzy   . GERD (gastroesophageal reflux disease)   . Hyperlipidemia   . Hypertension   . Pancreatic cyst 02/2015   Recommend  follow-up MRI in 12 months.    Family History  Problem Relation Age of Onset  . Ovarian cancer Mother   . Heart disease Mother   . Prostate cancer Father   . Heart disease Father   . Colon cancer Father   . Breast cancer Sister   . Prostate cancer Brother   . Cystic fibrosis Sister   . Heart disease Sister   . Pancreatic cancer Neg Hx   . Rectal cancer Neg Hx   . Stomach cancer Neg Hx     Past Surgical History:  Procedure Laterality Date  . ABDOMINAL HYSTERECTOMY    . ANTERIOR CERVICAL DECOMP/DISCECTOMY FUSION N/A 07/20/2012   Procedure: ANTERIOR CERVICAL DISCECTOMY FUSION C5-6, C6-7 with transgraft(Alphatec), local bone graft, plate and screws ;  Surgeon: Kerrin Champagne, MD;  Location: [facility] OR;  Service: Orthopedics;  Laterality: N/A;  . BACK SURGERY    . COLONOSCOPY    .  ESOPHAGOGASTRODUODENOSCOPY    . NECK SURGERY    . REPLACEMENT TOTAL KNEE BILATERAL    . THYROIDECTOMY, PARTIAL    . TRANSFORAMINAL LUMBAR INTERBODY FUSION (TLIF) WITH PEDICLE SCREW FIXATION 2 LEVEL N/A 01/23/2016   Procedure: TRANSFORAMINAL LUMBAR INTERBODY FUSION (TLIF) WITH PEDICLE SCREW FIXATION 2 LEVEL WITH HARDWARE REMOVAL AND EXTENSION OF HARDWARE.;  Surgeon: Kerrin Champagne, MD;  Location: [facility] OR;  Service: Orthopedics;  Laterality: N/A;  L1-L3    Social History   Occupational History  . Occupation: Retired  Tobacco Use  . Smoking status: Never Smoker  . Smokeless tobacco: Never Used  Substance and Sexual Activity  . Alcohol use: No    Alcohol/week: 0.0 oz  . Drug use: No  . Sexual activity: Not on file

## 2017-08-01 ENCOUNTER — Ambulatory Visit: Payer: Medicare Other

## 2017-08-04 ENCOUNTER — Ambulatory Visit: Payer: Medicare Other | Admitting: Gastroenterology

## 2017-08-05 ENCOUNTER — Ambulatory Visit: Payer: Medicare Other | Admitting: Nurse Practitioner

## 2017-08-06 ENCOUNTER — Ambulatory Visit (INDEPENDENT_AMBULATORY_CARE_PROVIDER_SITE_OTHER): Payer: Medicare Other | Admitting: Physician Assistant

## 2017-08-11 ENCOUNTER — Other Ambulatory Visit (INDEPENDENT_AMBULATORY_CARE_PROVIDER_SITE_OTHER): Payer: Self-pay | Admitting: Specialist

## 2017-08-11 ENCOUNTER — Other Ambulatory Visit: Payer: Self-pay | Admitting: Nurse Practitioner

## 2017-08-11 ENCOUNTER — Other Ambulatory Visit: Payer: Self-pay

## 2017-08-11 DIAGNOSIS — E782 Mixed hyperlipidemia: Secondary | ICD-10-CM

## 2017-08-11 DIAGNOSIS — I10 Essential (primary) hypertension: Secondary | ICD-10-CM

## 2017-08-11 NOTE — Telephone Encounter (Signed)
Ok with me. Please place any necessary orders. 

## 2017-08-11 NOTE — Telephone Encounter (Signed)
Rx has been sent to pharmacy

## 2017-08-11 NOTE — Telephone Encounter (Signed)
Please advise patient about refills. She is no longer under my care

## 2017-08-11 NOTE — Addendum Note (Signed)
Addended by: Mare LoanLAWSON, Daune Colgate M on: 08/11/2017 01:36 PM   Modules accepted: Orders

## 2017-08-12 ENCOUNTER — Ambulatory Visit (INDEPENDENT_AMBULATORY_CARE_PROVIDER_SITE_OTHER): Payer: Medicare Other | Admitting: Nurse Practitioner

## 2017-08-12 ENCOUNTER — Encounter: Payer: Self-pay | Admitting: Nurse Practitioner

## 2017-08-12 ENCOUNTER — Other Ambulatory Visit (INDEPENDENT_AMBULATORY_CARE_PROVIDER_SITE_OTHER): Payer: Medicare Other

## 2017-08-12 VITALS — BP 140/92 | HR 61 | Temp 97.7°F | Resp 18 | Ht 63.0 in | Wt 274.0 lb

## 2017-08-12 DIAGNOSIS — Z01818 Encounter for other preprocedural examination: Secondary | ICD-10-CM

## 2017-08-12 DIAGNOSIS — I10 Essential (primary) hypertension: Secondary | ICD-10-CM

## 2017-08-12 DIAGNOSIS — K862 Cyst of pancreas: Secondary | ICD-10-CM | POA: Diagnosis not present

## 2017-08-12 LAB — COMPREHENSIVE METABOLIC PANEL
ALBUMIN: 4.2 g/dL (ref 3.5–5.2)
ALK PHOS: 61 U/L (ref 39–117)
ALT: 5 U/L (ref 0–35)
AST: 12 U/L (ref 0–37)
BILIRUBIN TOTAL: 0.4 mg/dL (ref 0.2–1.2)
BUN: 18 mg/dL (ref 6–23)
CO2: 28 mEq/L (ref 19–32)
Calcium: 9.7 mg/dL (ref 8.4–10.5)
Chloride: 101 mEq/L (ref 96–112)
Creatinine, Ser: 1.07 mg/dL (ref 0.40–1.20)
GFR: 65.09 mL/min (ref >60.00)
GLUCOSE: 95 mg/dL (ref 70–99)
Potassium: 4.5 mEq/L (ref 3.5–5.1)
Sodium: 136 mEq/L (ref 135–145)
TOTAL PROTEIN: 7.7 g/dL (ref 6.0–8.3)

## 2017-08-12 LAB — CBC WITH DIFFERENTIAL/PLATELET
BASOS ABS: 0 10*3/uL (ref 0.0–0.1)
Basophils Relative: 0.9 % (ref 0.0–3.0)
Eosinophils Absolute: 0.2 10*3/uL (ref 0.0–0.7)
Eosinophils Relative: 4.8 % (ref 0.0–5.0)
HCT: 39.8 % (ref 36.0–46.0)
HEMOGLOBIN: 12.9 g/dL (ref 12.0–15.0)
LYMPHS ABS: 1.6 10*3/uL (ref 0.7–4.0)
Lymphocytes Relative: 37.5 % (ref 12.0–46.0)
MCHC: 32.3 g/dL (ref 30.0–36.0)
MCV: 78.2 fl (ref 78.0–100.0)
MONOS PCT: 8.9 % (ref 3.0–12.0)
Monocytes Absolute: 0.4 10*3/uL (ref 0.1–1.0)
NEUTROS PCT: 47.9 % (ref 43.0–77.0)
Neutro Abs: 2.1 10*3/uL (ref 1.4–7.7)
Platelets: 247 10*3/uL (ref 150.0–400.0)
RBC: 5.09 Mil/uL (ref 3.87–5.11)
RDW: 16.1 % — ABNORMAL HIGH (ref 11.5–15.5)
WBC: 4.3 10*3/uL (ref 4.0–10.5)

## 2017-08-12 NOTE — Progress Notes (Signed)
Name: [name]   MRN: [id number]    DOB: 12-01-1945   Date:08/12/2017       Progress Note  Subjective  Chief Complaint  Chief Complaint  Patient presents with  . Pre-op Exam    Back surgery     HPI   She is planning to have back surgery with Dr Otelia SergeantNitka once she obtains surgical clearance.             She has a significant medical history of hypertension, GERD, pancreatic cyst, osteoarthritis. She reports taking daily medications including amlodipine 5 and zantac 300 daily as instructed.  She also takes a daily aspirin but is on no other blood thinning medications, has been holding her aspirin per Dr Barbaraann FasterNitka's orders. She denies any personal or family history of anesthesia complication.             Today she is alert, oriented and pleasant. She states she feels well overall and is looking forward to her back surgery so that she can have relief of back pain and improved mobility. She remains active despite her back pain, cleaning house and doing daily ADLs independently.She experiences the pain most days but tries to avoid taking prescription pain medication due to worry for adverse effects. She denies weakness, injury.  She was actually seen in the office by another provider in February for nausea and after her lipase was found to be elevated, an MRI of her abdomen was ordered and she was referred to GI for further follow up. She has a known history of pancreatic cyst. She tells me today that she did not go for follow up or further testing due to cost and that her nausea has actually resolved. She says she plans to reschedule with GI after her back surgery.   Patient Active Problem List   Diagnosis Date Noted  . S/P partial thyroidectomy 08/23/2016  . Spinal stenosis, lumbar region, with neurogenic claudication 01/23/2016    Class: Chronic  . Other secondary scoliosis, 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    Class: Chronic  . Spondylolisthesis of 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  . Diastolic dysfunction    . Hypokalemia 03/08/2015  . Bradycardia 03/08/2015  . Nausea and vomiting 03/03/2015  . Syncope and collapse 03/02/2015  . Pancreatic cyst 02/28/2015  . HNP (herniated nucleus pulposus), cervical 07/20/2012    Class: Acute  . Cervical spondylosis without myelopathy 07/20/2012    Class: Chronic  . Venous (peripheral) insufficiency 06/08/2010  . Asymptomatic varicose veins 06/07/2010  . HYPERLIPIDEMIA 04/16/2010  . Essential hypertension 04/16/2010  . GERD 04/16/2010  . HIATAL HERNIA 04/16/2010  . OSTEOARTHRITIS 04/16/2010  . DYSPHAGIA UNSPECIFIED 04/16/2010  . Atrophic vaginitis 03/31/2009    Past Surgical History:  Procedure Laterality Date  . ABDOMINAL HYSTERECTOMY    . ANTERIOR CERVICAL DECOMP/DISCECTOMY FUSION N/A 07/20/2012   Procedure: ANTERIOR CERVICAL DISCECTOMY FUSION C5-6, C6-7 with transgraft(Alphatec), local bone graft, plate and screws ;  Surgeon: Kerrin ChampagneJames E Nitka, MD;  Location: [facility] OR;  Service: Orthopedics;  Laterality: N/A;  . BACK SURGERY    . COLONOSCOPY    . ESOPHAGOGASTRODUODENOSCOPY    . NECK SURGERY    . REPLACEMENT TOTAL KNEE BILATERAL    . THYROIDECTOMY, PARTIAL    . TRANSFORAMINAL LUMBAR INTERBODY FUSION (TLIF) WITH PEDICLE SCREW FIXATION 2 LEVEL N/A 01/23/2016   Procedure: TRANSFORAMINAL LUMBAR INTERBODY FUSION (TLIF) WITH PEDICLE SCREW FIXATION 2 LEVEL WITH HARDWARE REMOVAL AND EXTENSION OF HARDWARE.;  Surgeon: Kerrin ChampagneJames E Nitka, MD;  Location: [facility] OR;  Service: Orthopedics;  Laterality: N/A;  L1-L3     Family History  Problem Relation Age of Onset  . Ovarian cancer Mother   . Heart disease Mother   . Prostate cancer Father   . Heart disease Father   . Colon cancer Father   . Breast cancer Sister   . Prostate cancer Brother   . Cystic fibrosis Sister   . Heart disease Sister   . Pancreatic cancer Neg Hx   . Rectal cancer Neg Hx   . Stomach cancer Neg Hx     Social History   Socioeconomic History  . Marital status: Divorced    Spouse name: Not  on file  . Number of children: 2  . Years of education: Not on file  . Highest education level: Not on file  Occupational History  . Occupation: Retired  Engineer, production  . Financial resource strain: Not on file  . Food insecurity:    Worry: Not on file    Inability: Not on file  . Transportation needs:    Medical: Not on file    Non-medical: Not on file  Tobacco Use  . Smoking status: Never Smoker  . Smokeless tobacco: Never Used  Substance and Sexual Activity  . Alcohol use: No    Alcohol/week: 0.0 oz  . Drug use: No  . Sexual activity: Not on file  Lifestyle  . Physical activity:    Days per week: Not on file    Minutes per session: Not on file  . Stress: Not on file  Relationships  . Social connections:    Talks on phone: Not on file    Gets together: Not on file    Attends religious service: Not on file    Active member of club or organization: Not on file    Attends meetings of clubs or organizations: Not on file    Relationship status: Not on file  . Intimate partner violence:    Fear of current or ex partner: Not on file    Emotionally abused: Not on file    Physically abused: Not on file    Forced sexual activity: Not on file  Other Topics Concern  . Not on file  Social History Narrative  . Not on file     Current Outpatient Medications:  .  aspirin EC 81 MG tablet, Take 81 mg by mouth daily., Disp: , Rfl:  .  HYDROcodone-acetaminophen (NORCO/VICODIN) 5-325 MG tablet, Take 1 tablet by mouth every 6 (six) hours as needed for moderate pain., Disp: 30 tablet, Rfl: 0 .  Multiple Vitamin (MULTIVITAMIN WITH MINERALS) TABS tablet, Take 1 tablet by mouth daily., Disp: , Rfl:  .  naproxen (NAPROSYN) 500 MG tablet, TAKE 1 TABLET BY MOUTH TWO  TIMES DAILY WITH A MEAL, Disp: 180 tablet, Rfl: 1 .  ondansetron (ZOFRAN ODT) 4 MG disintegrating tablet, 4mg  ODT q4 hours prn nausea/vomit, Disp: 20 tablet, Rfl: 0 .  ranitidine (ZANTAC) 300 MG tablet, Take 1 tablet (300 mg  total) by mouth at bedtime. Need Office visit for further refills (Patient not taking: Reported on 08/12/2017), Disp: 90 tablet, Rfl: 0  Allergies  Allergen Reactions  . Lisinopril Swelling    Swelling in mouth and throat  . Penicillins Rash    Has patient had a PCN reaction causing immediate rash, facial/tongue/throat swelling, SOB or lightheadedness with hypotension: No Has patient had a PCN reaction causing severe rash involving mucus membranes or skin necrosis: No Has patient had a  PCN reaction that required hospitalization No Has patient had a PCN reaction occurring within the last 10 years: No If all of the above answers are "NO", then may proceed with Cephalosporin use.   . Tramadol Hcl Rash     Review of Systems  Constitutional: Negative for chills and fever.  HENT: Negative for hearing loss and sore throat.   Eyes: Negative for blurred vision and double vision.  Respiratory: Negative for cough and shortness of breath.   Cardiovascular: Negative for chest pain and palpitations.  Gastrointestinal: Negative for constipation, diarrhea, heartburn, nausea and vomiting.  Genitourinary: Negative for dysuria and hematuria.  Musculoskeletal: Positive for back pain. Negative for falls.  Skin: Negative for rash.  Neurological: Negative for speech change and headaches.  Endo/Heme/Allergies: Does not bruise/bleed easily.  Psychiatric/Behavioral: Negative for depression. The patient is not nervous/anxious.     Objective  Vitals:   08/12/17 1127  BP: (!) 140/92  Pulse: 61  Resp: 18  Temp: 97.7 F (36.5 C)  TempSrc: Oral  SpO2: 98%  Weight: 274 lb (124.3 kg)  Height: 5\' 3"  (1.6 m)   Body mass index is 48.54 kg/m.    Physical Exam Vital signs reviewed. Constitutional: Patient appears well-developed and well-nourished. No distress.  HENT: Head: Normocephalic and atraumatic. Nose: Nose normal. Mouth/Throat: Oropharynx is clear and moist.  Eyes: Conjunctivae and EOM are  normal. Pupils are equal, round, and reactive to light. No scleral icterus.  Neck: Normal range of motion. Neck supple.  Cardiovascular: Normal rate, regular rhythm and normal heart sounds.  No BLE edema. Distal pulses intact. Pulmonary/Chest: Effort normal and breath sounds normal. No respiratory distress. Abdominal: Soft. No distension. Musculoskeletal:  No gross deformities Neurological: She is alert and oriented to person, place, and time.  Coordination, balance, strength, speech and gait are normal.  Skin: Skin is warm and dry. No rash noted. No erythema.  Psychiatric: Patient has a normal mood and affect. behavior is normal.   Assessment & Plan RTC in 1 month for F/U:HTN, pancreatic cyst  Pre-op exam History and PE without any contraindications to back surgery. Ekg, cbc, cmet updated today. Surgically cleared with acceptable risk from medical standpoint pending CBC and CMET are stable-I will sign her surgical clearance document upon receipt of document and labwork results - EKG 12-Lead: I have personally reviewed the EKG tracing and agree with computerized printout as noted: sinus rhythm without acute abnormalities  - Comprehensive metabolic panel; Future - CBC with Differential/Platelet; Future

## 2017-08-12 NOTE — Assessment & Plan Note (Signed)
Recommended F/U with abdominal MRI and GI consult for routine monitoring of pancreatic cyst but she prefers to wait until her back surgery - will F/u at next OV

## 2017-08-12 NOTE — Assessment & Plan Note (Signed)
BP noted to be slightly elevated today as well as on past clinic readings She says she is taking amlodipine 5 daily but it is not listed on med rec today. Instructed to continue amlodipine 5 daily and work on healthy diet and exercise- DASH diet printed on AVS RTC in 1 month for F/U-requested patient bring medications with her to next OV to update med rec

## 2017-08-12 NOTE — Patient Instructions (Addendum)
Please head downstairs for lab work/x-rays. If any of your test results are critically abnormal, you will be contacted right away. Your results may be released to your MyChart for viewing before I am able to provide you with my response. I will contact you within a week about your test results and any recommendations for abnormalities.  As long as your lab work is okay, I will be able to sign your surgical clearance form when I receive it.  Please work on eating a healthy low sodium diet.and return in 1 month for blood pressure follow up.   DASH Eating Plan DASH stands for "Dietary Approaches to Stop Hypertension." The DASH eating plan is a healthy eating plan that has been shown to reduce high blood pressure (hypertension). It may also reduce your risk for type 2 diabetes, heart disease, and stroke. The DASH eating plan may also help with weight loss. What are tips for following this plan? General guidelines  Avoid eating more than 2,300 mg (milligrams) of salt (sodium) a day. If you have hypertension, you may need to reduce your sodium intake to 1,500 mg a day.  Limit alcohol intake to no more than 1 drink a day for nonpregnant women and 2 drinks a day for men. One drink equals 12 oz of beer, 5 oz of wine, or 1 oz of hard liquor.  Work with your health care provider to maintain a healthy body weight or to lose weight. Ask what an ideal weight is for you.  Get at least 30 minutes of exercise that causes your heart to beat faster (aerobic exercise) most days of the week. Activities may include walking, swimming, or biking.  Work with your health care provider or diet and nutrition specialist (dietitian) to adjust your eating plan to your individual calorie needs. Reading food labels  Check food labels for the amount of sodium per serving. Choose foods with less than 5 percent of the Daily Value of sodium. Generally, foods with less than 300 mg of sodium per serving fit into this eating  plan.  To find whole grains, look for the word "whole" as the first word in the ingredient list. Shopping  Buy products labeled as "low-sodium" or "no salt added."  Buy fresh foods. Avoid canned foods and premade or frozen meals. Cooking  Avoid adding salt when cooking. Use salt-free seasonings or herbs instead of table salt or sea salt. Check with your health care provider or pharmacist before using salt substitutes.  Do not fry foods. Cook foods using healthy methods such as baking, boiling, grilling, and broiling instead.  Cook with heart-healthy oils, such as olive, canola, soybean, or sunflower oil. Meal planning   Eat a balanced diet that includes: ? 5 or more servings of fruits and vegetables each day. At each meal, try to fill half of your plate with fruits and vegetables. ? Up to 6-8 servings of whole grains each day. ? Less than 6 oz of lean meat, poultry, or fish each day. A 3-oz serving of meat is about the same size as a deck of cards. One egg equals 1 oz. ? 2 servings of low-fat dairy each day. ? A serving of nuts, seeds, or beans 5 times each week. ? Heart-healthy fats. Healthy fats called Omega-3 fatty acids are found in foods such as flaxseeds and coldwater fish, like sardines, salmon, and mackerel.  Limit how much you eat of the following: ? Canned or prepackaged foods. ? Food that is high in trans  fat, such as fried foods. ? Food that is high in saturated fat, such as fatty meat. ? Sweets, desserts, sugary drinks, and other foods with added sugar. ? Full-fat dairy products.  Do not salt foods before eating.  Try to eat at least 2 vegetarian meals each week.  Eat more home-cooked food and less restaurant, buffet, and fast food.  When eating at a restaurant, ask that your food be prepared with less salt or no salt, if possible. What foods are recommended? The items listed may not be a complete list. Talk with your dietitian about what dietary choices are best  for you. Grains Whole-grain or whole-wheat bread. Whole-grain or whole-wheat pasta. Brown rice. Orpah Cobb. Bulgur. Whole-grain and low-sodium cereals. Pita bread. Low-fat, low-sodium crackers. Whole-wheat flour tortillas. Vegetables Fresh or frozen vegetables (raw, steamed, roasted, or grilled). Low-sodium or reduced-sodium tomato and vegetable juice. Low-sodium or reduced-sodium tomato sauce and tomato paste. Low-sodium or reduced-sodium canned vegetables. Fruits All fresh, dried, or frozen fruit. Canned fruit in natural juice (without added sugar). Meat and other protein foods Skinless chicken or [place]. Ground chicken or [place]. Pork with fat trimmed off. Fish and seafood. Egg whites. Dried beans, peas, or lentils. Unsalted nuts, nut butters, and seeds. Unsalted canned beans. Lean cuts of beef with fat trimmed off. Low-sodium, lean deli meat. Dairy Low-fat (1%) or fat-free (skim) milk. Fat-free, low-fat, or reduced-fat cheeses. Nonfat, low-sodium ricotta or cottage cheese. Low-fat or nonfat yogurt. Low-fat, low-sodium cheese. Fats and oils Soft margarine without trans fats. Vegetable oil. Low-fat, reduced-fat, or light mayonnaise and salad dressings (reduced-sodium). Canola, safflower, olive, soybean, and sunflower oils. Avocado. Seasoning and other foods Herbs. Spices. Seasoning mixes without salt. Unsalted popcorn and pretzels. Fat-free sweets. What foods are not recommended? The items listed may not be a complete list. Talk with your dietitian about what dietary choices are best for you. Grains Baked goods made with fat, such as croissants, muffins, or some breads. Dry pasta or rice meal packs. Vegetables Creamed or fried vegetables. Vegetables in a cheese sauce. Regular canned vegetables (not low-sodium or reduced-sodium). Regular canned tomato sauce and paste (not low-sodium or reduced-sodium). Regular tomato and vegetable juice (not low-sodium or reduced-sodium). Rosita Fire.  Olives. Fruits Canned fruit in a light or heavy syrup. Fried fruit. Fruit in cream or butter sauce. Meat and other protein foods Fatty cuts of meat. Ribs. Fried meat. Tomasa Blase. Sausage. Bologna and other processed lunch meats. Salami. Fatback. Hotdogs. Bratwurst. Salted nuts and seeds. Canned beans with added salt. Canned or smoked fish. Whole eggs or egg yolks. Chicken or [place] with skin. Dairy Whole or 2% milk, cream, and half-and-half. Whole or full-fat cream cheese. Whole-fat or sweetened yogurt. Full-fat cheese. Nondairy creamers. Whipped toppings. Processed cheese and cheese spreads. Fats and oils Butter. Stick margarine. Lard. Shortening. Ghee. Bacon fat. Tropical oils, such as coconut, palm kernel, or palm oil. Seasoning and other foods Salted popcorn and pretzels. Onion salt, garlic salt, seasoned salt, table salt, and sea salt. Worcestershire sauce. Tartar sauce. Barbecue sauce. Teriyaki sauce. Soy sauce, including reduced-sodium. Steak sauce. Canned and packaged gravies. Fish sauce. Oyster sauce. Cocktail sauce. Horseradish that you find on the shelf. Ketchup. Mustard. Meat flavorings and tenderizers. Bouillon cubes. Hot sauce and Tabasco sauce. Premade or packaged marinades. Premade or packaged taco seasonings. Relishes. Regular salad dressings. Where to find more information:  National Heart, Lung, and Blood Institute: PopSteam.is  American Heart Association: www.heart.org Summary  The DASH eating plan is a healthy eating plan that has  been shown to reduce high blood pressure (hypertension). It may also reduce your risk for type 2 diabetes, heart disease, and stroke.  With the DASH eating plan, you should limit salt (sodium) intake to 2,300 mg a day. If you have hypertension, you may need to reduce your sodium intake to 1,500 mg a day.  When on the DASH eating plan, aim to eat more fresh fruits and vegetables, whole grains, lean proteins, low-fat dairy, and heart-healthy  fats.  Work with your health care provider or diet and nutrition specialist (dietitian) to adjust your eating plan to your individual calorie needs. This information is not intended to replace advice given to you by your health care provider. Make sure you discuss any questions you have with your health care provider. Document Released: 04/04/2011 Document Revised: 04/08/2016 Document Reviewed: 04/08/2016 Elsevier Interactive Patient Education  [employer].

## 2017-08-14 ENCOUNTER — Ambulatory Visit (INDEPENDENT_AMBULATORY_CARE_PROVIDER_SITE_OTHER): Payer: Medicare Other | Admitting: Specialist

## 2017-08-14 ENCOUNTER — Encounter (INDEPENDENT_AMBULATORY_CARE_PROVIDER_SITE_OTHER): Payer: Self-pay | Admitting: Specialist

## 2017-08-14 VITALS — BP 152/83 | HR 55 | Ht 63.0 in | Wt 274.0 lb

## 2017-08-14 DIAGNOSIS — M5135 Other intervertebral disc degeneration, thoracolumbar region: Secondary | ICD-10-CM | POA: Diagnosis not present

## 2017-08-14 DIAGNOSIS — M48062 Spinal stenosis, lumbar region with neurogenic claudication: Secondary | ICD-10-CM

## 2017-08-14 DIAGNOSIS — M4815 Ankylosing hyperostosis [Forestier], thoracolumbar region: Secondary | ICD-10-CM | POA: Diagnosis not present

## 2017-08-14 DIAGNOSIS — M5126 Other intervertebral disc displacement, lumbar region: Secondary | ICD-10-CM | POA: Diagnosis not present

## 2017-08-14 NOTE — Progress Notes (Signed)
Office Visit Note   Patient: [name] L Hume           Date of Birth: 1945/05/06           MRN: [id number] Visit Date: 08/14/2017              Requested by: Ardith DarkParker, Caleb M, MD [address] [place], [place] [address] PCP: Evaristo BuryShambley, Ashleigh N, NP   Assessment & Plan: Visit Diagnoses:  1. Spinal stenosis of lumbar region with neurogenic claudication   2. Herniation of nucleus pulposus, lumbar   3. Forestier's disease of thoracolumbar region   4. DDD (degenerative disc disease), thoracolumbar     Plan: Avoid bending, stooping and avoid lifting weights greater than 10 lbs. Avoid prolong standing and walking. Order for a new walker with wheels. Surgery scheduling secretary Tivis RingerSherri Billings, will call you in the next week to schedule for surgery.  Surgery recommended is a thre level lumbar fusion T12-L1, T11-12 and T10-11 this would be done with rods, screws, hooks and possible sublaminar wires and possible cage with local bone graft and allograft (donor bone graft). Take hydrocodone for for pain. Risk of surgery includes risk of infection 1 in 200 patients, bleeding 1/2% chance you would need a transfusion.   Risk to the nerves is one in 10,000. You will need to use a brace for 3 months and wean from the brace on the 4th month. Expect improved walking and standing tolerance. Expect relief of leg pain but numbness may persist depending on the length and degree of pressure that has been present.    Follow-Up Instructions: Return in about 1 month (around 09/11/2017) for post op return appointment..   Orders:  No orders of the defined types were placed in this encounter.  No orders of the defined types were placed in this encounter.     Procedures: No procedures performed   Clinical Data: No additional findings.   Subjective: Chief Complaint  Patient presents with  . Lower Back - Follow-up    72 year old female with history of multiple level lumbar spinal stenosis and  multiple level lumbar fusions from L2 to S1. She is experiencing pain with standing and walking and pain into the lower back and radiates into the left anterior groin and thighs with sitting and bendiing. The MRI scans show a change in the L1-2 level with disc herniation and spinal stenosis. There is degenerative changes at the thoracolumbar junction and DISH changes in the mid and lower thoracic spine. She has questions about the upcoming surgeon   Review of Systems  Constitutional: Negative.   HENT: Negative.   Eyes: Negative.   Respiratory: Negative.   Cardiovascular: Negative.   Gastrointestinal: Negative.   Endocrine: Negative.   Genitourinary: Negative.   Musculoskeletal: Negative.   Skin: Negative.   Allergic/Immunologic: Negative.   Neurological: Negative.   Hematological: Negative.   Psychiatric/Behavioral: Negative.      Objective: Vital Signs: BP (!) 152/83 (BP Location: Left Arm, Patient Position: Sitting)   Pulse (!) 55   Ht 5\' 3"  (1.6 m)   Wt 274 lb (124.3 kg)   BMI 48.54 kg/m   Physical Exam  Back Exam   Tenderness  The patient is experiencing tenderness in the lumbar.  Range of Motion  Extension: abnormal  Flexion: normal  Lateral bend right: abnormal  Lateral bend left: abnormal  Rotation right: abnormal  Rotation left: abnormal   Muscle Strength  Right Quadriceps:  5/5  Left Quadriceps:  5/5  Right Hamstrings:  5/5  Left Hamstrings:  5/5   Tests  Straight leg raise right: negative Straight leg raise left: negative  Reflexes  Patellar: normal Achilles: normal Biceps: normal Babinski's sign: normal   Other  Toe walk: normal Heel walk: normal Sensation: normal Gait: normal  Erythema: no back redness Scars: present  Comments:  Weak in the hip flexors bilaterally 4/5 left greater than right.       Specialty Comments:  No specialty comments available.  Imaging: No results found.   PMFS History: Patient Active Problem List     Diagnosis Date Noted  . Spinal stenosis, lumbar region, with neurogenic claudication 01/23/2016    Priority: High    Class: Chronic  . Other secondary scoliosis, 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    Priority: High    Class: Chronic  . HNP (herniated nucleus pulposus), cervical 07/20/2012    Priority: High    Class: Acute  . Cervical spondylosis without myelopathy 07/20/2012    Priority: High    Class: Chronic  . S/P partial thyroidectomy 08/23/2016  . Spondylolisthesis of 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  . Diastolic dysfunction   . Hypokalemia 03/08/2015  . Bradycardia 03/08/2015  . Syncope and collapse 03/02/2015  . Pancreatic cyst 02/28/2015  . Venous (peripheral) insufficiency 06/08/2010  . Asymptomatic varicose veins 06/07/2010  . HYPERLIPIDEMIA 04/16/2010  . Essential hypertension 04/16/2010  . GERD 04/16/2010  . HIATAL HERNIA 04/16/2010  . OSTEOARTHRITIS 04/16/2010  . DYSPHAGIA UNSPECIFIED 04/16/2010  . Atrophic vaginitis 03/31/2009   Past Medical History:  Diagnosis Date  . Allergy   . Arthritis   . Cataract of right eye   . Diastolic dysfunction   . Dizzy   . GERD (gastroesophageal reflux disease)   . Hyperlipidemia   . Hypertension   . Pancreatic cyst 02/2015   Recommend follow-up MRI in 12 months.    Family History  Problem Relation Age of Onset  . Ovarian cancer Mother   . Heart disease Mother   . Prostate cancer Father   . Heart disease Father   . Colon cancer Father   . Breast cancer Sister   . Prostate cancer Brother   . Cystic fibrosis Sister   . Heart disease Sister   . Pancreatic cancer Neg Hx   . Rectal cancer Neg Hx   . Stomach cancer Neg Hx     Past Surgical History:  Procedure Laterality Date  . ABDOMINAL HYSTERECTOMY    . ANTERIOR CERVICAL DECOMP/DISCECTOMY FUSION N/A 07/20/2012   Procedure: ANTERIOR CERVICAL DISCECTOMY FUSION C5-6, C6-7 with transgraft(Alphatec), local bone graft, plate and screws ;  Surgeon: Kerrin Champagne, MD;  Location: [facility] OR;   Service: Orthopedics;  Laterality: N/A;  . BACK SURGERY    . COLONOSCOPY    . ESOPHAGOGASTRODUODENOSCOPY    . NECK SURGERY    . REPLACEMENT TOTAL KNEE BILATERAL    . THYROIDECTOMY, PARTIAL    . TRANSFORAMINAL LUMBAR INTERBODY FUSION (TLIF) WITH PEDICLE SCREW FIXATION 2 LEVEL N/A 01/23/2016   Procedure: TRANSFORAMINAL LUMBAR INTERBODY FUSION (TLIF) WITH PEDICLE SCREW FIXATION 2 LEVEL WITH HARDWARE REMOVAL AND EXTENSION OF HARDWARE.;  Surgeon: Kerrin Champagne, MD;  Location: [facility] OR;  Service: Orthopedics;  Laterality: N/A;  L1-L3    Social History   Occupational History  . Occupation: Retired  Tobacco Use  . Smoking status: Never Smoker  . Smokeless tobacco: Never Used  Substance and Sexual Activity  . Alcohol use: No    Alcohol/week: 0.0 oz  .  Drug use: No  . Sexual activity: Not on file       

## 2017-08-14 NOTE — Patient Instructions (Signed)
Plan: Avoid bending, stooping and avoid lifting weights greater than 10 lbs. Avoid prolong standing and walking. Order for a new walker with wheels. Surgery scheduling secretary Tivis RingerSherri Billings, will call you in the next week to schedule for surgery.  Surgery recommended is a thre level lumbar fusion T12-L1, T11-12 and T10-11 this would be done with rods, screws, hooks and possible sublaminar wires and possible cage with local bone graft and allograft (donor bone graft). Take hydrocodone for for pain. Risk of surgery includes risk of infection 1 in 200 patients, bleeding 1/2% chance you would need a transfusion.   Risk to the nerves is one in 10,000. You will need to use a brace for 3 months and wean from the brace on the 4th month. Expect improved walking and standing tolerance. Expect relief of leg pain but numbness may persist depending on the length and degree of pressure that has been present.

## 2017-08-19 MED ORDER — SIMVASTATIN 20 MG PO TABS: ORAL_TABLET | ORAL | 0 refills | Status: DC

## 2017-08-19 MED ORDER — AMLODIPINE BESYLATE 5 MG PO TABS: 5.0000 mg | ORAL_TABLET | Freq: Every day | ORAL | 0 refills | Status: DC

## 2017-08-26 ENCOUNTER — Telehealth (INDEPENDENT_AMBULATORY_CARE_PROVIDER_SITE_OTHER): Payer: Self-pay | Admitting: Specialist

## 2017-08-26 NOTE — Telephone Encounter (Signed)
Patient called really eager to set up her surgery with you, she requested a call back today if possible. CB # [phone]

## 2017-08-27 NOTE — Telephone Encounter (Signed)
I called patient and left voice mail for return call. °

## 2017-09-01 ENCOUNTER — Ambulatory Visit (INDEPENDENT_AMBULATORY_CARE_PROVIDER_SITE_OTHER): Payer: Medicare Other | Admitting: Specialist

## 2017-09-03 ENCOUNTER — Encounter: Payer: Self-pay | Admitting: Family

## 2017-09-08 ENCOUNTER — Telehealth (INDEPENDENT_AMBULATORY_CARE_PROVIDER_SITE_OTHER): Payer: Self-pay | Admitting: Specialist

## 2017-09-08 NOTE — Telephone Encounter (Signed)
I called and advised that it was fine her to have her mammogram prior to surgery.

## 2017-09-08 NOTE — Telephone Encounter (Signed)
Patient called stating that she has a mammogram scheduled soon and her surgery is coming up soon as well and she was wondering if this would be okay to get done before her surgery? CB # [phone]

## 2017-09-10 ENCOUNTER — Ambulatory Visit
Admission: RE | Admit: 2017-09-10 | Discharge: 2017-09-10 | Disposition: A | Discharge status: Home / Self Care | Payer: Medicare Other | Source: Ambulatory Visit | Attending: Nurse Practitioner | Admitting: Nurse Practitioner

## 2017-09-10 DIAGNOSIS — Z1231 Encounter for screening mammogram for malignant neoplasm of breast: Secondary | ICD-10-CM

## 2017-09-18 ENCOUNTER — Ambulatory Visit: Payer: Medicare Other | Admitting: Nurse Practitioner

## 2017-09-18 DIAGNOSIS — Z0289 Encounter for other administrative examinations: Secondary | ICD-10-CM

## 2017-09-24 ENCOUNTER — Ambulatory Visit (INDEPENDENT_AMBULATORY_CARE_PROVIDER_SITE_OTHER): Payer: Medicare Other | Admitting: Surgery

## 2017-09-24 VITALS — BP 141/88 | HR 58 | Temp 97.0°F | Resp 16 | Ht 63.0 in | Wt 253.0 lb

## 2017-09-24 DIAGNOSIS — M48062 Spinal stenosis, lumbar region with neurogenic claudication: Secondary | ICD-10-CM | POA: Diagnosis not present

## 2017-09-24 NOTE — Progress Notes (Signed)
72 year-old black female comes in today for preop evaluation.  She is scheduled for TRANSFORAMINAL LUMBAR INTERBODY FUSION RIGHT T12-L1, POSTERIOR FUSION T10-11, T11-12 AND T12-L1, DEPUY MPACT SCREWS, RODS, POSSIBLE HOOKS AND SUBLAMINAR WIRE, LOCAL BONE GRAFT, ALLOGRAFT BONE GRAFT, VIVIGEN.  We received preop medical clearance.  Full H&P will be placed in patient's chart.

## 2017-09-24 NOTE — H&P (Addendum)
[name] is an 72 y.o. female.   Chief Complaint: Back pain and lower extremity radiculopathy HPI: Patient with history of adjacent segment disease T12-L1 and disc herniation above L1-S1 fusion comes in today for preop evaluation.  We received preop medical clearance.  Past Medical History:  Diagnosis Date  . Allergy   . Arthritis   . Cataract of right eye   . Diastolic dysfunction   . Dizzy   . GERD (gastroesophageal reflux disease)   . Hyperlipidemia   . Hypertension   . Pancreatic cyst 02/2015   Recommend follow-up MRI in 12 months.    Past Surgical History:  Procedure Laterality Date  . ABDOMINAL HYSTERECTOMY    . ANTERIOR CERVICAL DECOMP/DISCECTOMY FUSION N/A 07/20/2012   Procedure: ANTERIOR CERVICAL DISCECTOMY FUSION C5-6, C6-7 with transgraft(Alphatec), local bone graft, plate and screws ;  Surgeon: Kerrin Champagne, MD;  Location: [facility] OR;  Service: Orthopedics;  Laterality: N/A;  . BACK SURGERY    . COLONOSCOPY    . ESOPHAGOGASTRODUODENOSCOPY    . NECK SURGERY    . REPLACEMENT TOTAL KNEE BILATERAL    . THYROIDECTOMY, PARTIAL    . TRANSFORAMINAL LUMBAR INTERBODY FUSION (TLIF) WITH PEDICLE SCREW FIXATION 2 LEVEL N/A 01/23/2016   Procedure: TRANSFORAMINAL LUMBAR INTERBODY FUSION (TLIF) WITH PEDICLE SCREW FIXATION 2 LEVEL WITH HARDWARE REMOVAL AND EXTENSION OF HARDWARE.;  Surgeon: Kerrin Champagne, MD;  Location: [facility] OR;  Service: Orthopedics;  Laterality: N/A;  L1-L3     Family History  Problem Relation Age of Onset  . Ovarian cancer Mother   . Heart disease Mother   . Prostate cancer Father   . Heart disease Father   . Colon cancer Father   . Breast cancer Sister   . Prostate cancer Brother   . Cystic fibrosis Sister   . Heart disease Sister   . Pancreatic cancer Neg Hx   . Rectal cancer Neg Hx   . Stomach cancer Neg Hx    Social History:  reports that she has never smoked. She has never used smokeless tobacco. She reports that she does not drink alcohol or use  drugs.  Allergies:  Allergies  Allergen Reactions  . Lisinopril Swelling    Swelling in mouth and throat  . Penicillins Rash    Has patient had a PCN reaction causing immediate rash, facial/tongue/throat swelling, SOB or lightheadedness with hypotension: No Has patient had a PCN reaction causing severe rash involving mucus membranes or skin necrosis: No Has patient had a PCN reaction that required hospitalization No Has patient had a PCN reaction occurring within the last 10 years: No If all of the above answers are "NO", then may proceed with Cephalosporin use.   . Tramadol Hcl Rash  . Tramadol Hcl Rash    No medications prior to admission.    No results found for this or any previous visit (from the past 48 hour(s)). No results found.  Review of Systems  Constitutional: Negative.   HENT: Negative.   Respiratory: Negative.   Cardiovascular: Negative.   Gastrointestinal: Negative.   Genitourinary: Negative.   Musculoskeletal: Positive for back pain.  Skin: Negative.   Neurological: Positive for tingling.  Psychiatric/Behavioral: Negative.     There were no vitals taken for this visit. Physical Exam  Constitutional: She appears well-nourished. No distress.  HENT:  Head: Normocephalic and atraumatic.  Eyes: Pupils are equal, round, and reactive to light. EOM are normal.  Neck: Normal range of motion.  Cardiovascular: Normal heart  sounds.  Respiratory: Effort normal and breath sounds normal. No respiratory distress.  GI: Soft. Bowel sounds are normal. She exhibits no distension. There is no tenderness.  Musculoskeletal:  Positive thoracolumbar paraspinal tenderness.  Negative straight leg raise.  Negative logroll.  Skin: Skin is warm and dry.  Psychiatric: She has a normal mood and affect.     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  We will proceed withTRANSFORAMINAL LUMBAR INTERBODY FUSION RIGHT T12-L1,  POSTERIOR FUSION T10-11, T11-12 AND T12-L1, DEPUY MPACT SCREWS, RODS, POSSIBLE HOOKS AND SUBLAMINAR WIRE, LOCAL BONE GRAFT, ALLOGRAFT BONE GRAFT, VIVIGEN II as scheduled.  Insert procedure discussed along with possible rehab/recovery time.  All questions answered.  Zonia Kief, PA-C 09/24/2017, 11:41 AM

## 2017-09-26 ENCOUNTER — Encounter: Payer: Medicare Other | Admitting: Nurse Practitioner

## 2017-09-26 NOTE — Pre-Procedure Instructions (Signed)
Noa L Socarras  09/26/2017      [id number] - [place], [place] - [address] AT [facility] [address] [place] [address] Phone: [phone] Fax: [phone]  [facility] - [place], [place] - [address] [facility] [address] [place] [address] Phone: [phone] Fax: [phone]    Your procedure is scheduled on jUNE 4  Report to [facility] Admitting at 0530 A.M.  Call this number if you have problems the morning of surgery:  [phone]   Remember:  NOTHING TO EAT OR DRINK AFTER MIDNIGHT    Take these medicines the morning of surgery with A SIP OF WATER  acetaminophen (TYLENOL)  HYDROcodone-acetaminophen (NORCO/VICODIN) omeprazole (PRILOSEC) ranitidine (ZANTAC)   7 days prior to surgery STOP taking any Aspirin(unless otherwise instructed by your surgeon), Aleve, Naproxen, Ibuprofen, Motrin, Advil, Goody's, BC's, all herbal medications, fish oil, and all vitamins     Do not wear jewelry, make-up or nail polish.  Do not wear lotions, powders, or perfumes, or deodorant.  Do not shave 48 hours prior to surgery.    Do not bring valuables to the hospital.  [facility] is not responsible for any belongings or valuables.  Contacts, dentures or bridgework may not be worn into surgery.  Leave your suitcase in the car.  After surgery it may be brought to your room.  For patients admitted to the hospital, discharge time will be determined by your treatment team.  Patients discharged the day of surgery will not be allowed to drive home.    Special instructions:   [place]- Preparing For Surgery  Before surgery, you can play an important role. Because skin is not sterile, your skin needs to be as free of germs as possible. You can reduce the number of germs on your skin by washing with CHG (chlorahexidine gluconate) Soap before surgery.  CHG is an antiseptic cleaner which kills  germs and bonds with the skin to continue killing germs even after washing.    Oral Hygiene is also important to reduce your risk of infection.  Remember - BRUSH YOUR TEETH THE MORNING OF SURGERY WITH YOUR REGULAR TOOTHPASTE  Please do not use if you have an allergy to CHG or antibacterial soaps. If your skin becomes reddened/irritated stop using the CHG.  Do not shave (including legs and underarms) for at least 48 hours prior to first CHG shower. It is OK to shave your face.  Please follow these instructions carefully.   1. Shower the NIGHT BEFORE SURGERY and the MORNING OF SURGERY with CHG.   2. If you chose to wash your hair, wash your hair first as usual with your normal shampoo.  3. After you shampoo, rinse your hair and body thoroughly to remove the shampoo.  4. Use CHG as you would any other liquid soap. You can apply CHG directly to the skin and wash gently with a scrungie or a clean washcloth.   5. Apply the CHG Soap to your body ONLY FROM THE NECK DOWN.  Do not use on open wounds or open sores. Avoid contact with your eyes, ears, mouth and genitals (private parts). Wash Face and genitals (private parts)  with your normal soap.  6. Wash thoroughly, paying special attention to the area where your surgery will be performed.  7. Thoroughly rinse your body with warm water from the neck down.  8. DO NOT  shower/wash with your normal soap after using and rinsing off the CHG Soap.  9. Pat yourself dry with a CLEAN TOWEL.  10. Wear CLEAN PAJAMAS to bed the night before surgery, wear comfortable clothes the morning of surgery  11. Place CLEAN SHEETS on your bed the night of your first shower and DO NOT SLEEP WITH PETS.    Day of Surgery:  Do not apply any deodorants/lotions.  Please wear clean clothes to the hospital/surgery center.   Remember to brush your teeth WITH YOUR REGULAR TOOTHPASTE.    Please read over the following fact sheets that you were given.

## 2017-09-29 ENCOUNTER — Encounter (HOSPITAL_COMMUNITY)
Admission: RE | Admit: 2017-09-29 | Discharge: 2017-09-29 | Disposition: A | Discharge status: Home / Self Care | Payer: Medicare Other | Source: Ambulatory Visit | Attending: Specialist | Admitting: Specialist

## 2017-09-29 ENCOUNTER — Encounter (HOSPITAL_COMMUNITY): Payer: Self-pay

## 2017-09-29 HISTORY — DX: Pneumonia, unspecified organism: J18.9

## 2017-09-29 HISTORY — DX: Personal history of other medical treatment: Z92.89

## 2017-09-29 LAB — COMPREHENSIVE METABOLIC PANEL
ALBUMIN: 4 g/dL (ref 3.5–5.0)
ALK PHOS: 60 U/L (ref 38–126)
ALT: 10 U/L — AB (ref 14–54)
AST: 19 U/L (ref 15–41)
Anion gap: 10 (ref 5–15)
BUN: 11 mg/dL (ref 6–20)
CALCIUM: 9.8 mg/dL (ref 8.9–10.3)
CO2: 28 mmol/L (ref 22–32)
CREATININE: 1.09 mg/dL — AB (ref 0.44–1.00)
Chloride: 102 mmol/L (ref 101–111)
GFR calc Af Amer: 58 mL/min — ABNORMAL LOW (ref >60)
GFR calc non Af Amer: 50 mL/min — ABNORMAL LOW (ref >60)
GLUCOSE: 100 mg/dL — AB (ref 65–99)
Potassium: 4.1 mmol/L (ref 3.5–5.1)
Sodium: 140 mmol/L (ref 135–145)
Total Bilirubin: 0.6 mg/dL (ref 0.3–1.2)
Total Protein: 7.7 g/dL (ref 6.5–8.1)

## 2017-09-29 LAB — URINALYSIS, ROUTINE W REFLEX MICROSCOPIC
Bilirubin Urine: NEGATIVE
GLUCOSE, UA: NEGATIVE mg/dL
HGB URINE DIPSTICK: NEGATIVE
Ketones, ur: NEGATIVE mg/dL
Leukocytes, UA: NEGATIVE
Nitrite: NEGATIVE
Protein, ur: NEGATIVE mg/dL
SPECIFIC GRAVITY, URINE: 1.009 (ref 1.005–1.030)
pH: 7 (ref 5.0–8.0)

## 2017-09-29 LAB — SURGICAL PCR SCREEN
MRSA, PCR: NEGATIVE
Staphylococcus aureus: NEGATIVE

## 2017-09-29 LAB — CBC
HCT: 41 % (ref 36.0–46.0)
HEMOGLOBIN: 13 g/dL (ref 12.0–15.0)
MCH: 25 pg — AB (ref 26.0–34.0)
MCHC: 31.7 g/dL (ref 30.0–36.0)
MCV: 78.7 fL (ref 78.0–100.0)
Platelets: 239 10*3/uL (ref 150–400)
RBC: 5.21 MIL/uL — AB (ref 3.87–5.11)
RDW: 16.1 % — ABNORMAL HIGH (ref 11.5–15.5)
WBC: 4 10*3/uL (ref 4.0–10.5)

## 2017-09-29 LAB — APTT: aPTT: 33 seconds (ref 24–36)

## 2017-09-29 LAB — PROTIME-INR
INR: 1.07
Prothrombin Time: 13.8 seconds (ref 11.4–15.2)

## 2017-09-29 MED ORDER — SODIUM CHLORIDE 0.9 % IV SOLN
1500.0000 mg | INTRAVENOUS | Status: AC
  Administered 2017-09-30: 1500 mg via INTRAVENOUS
  Filled 2017-09-29: qty 1500

## 2017-09-29 NOTE — Pre-Procedure Instructions (Signed)
[name]  09/29/2017      Your procedure is scheduled on Tuesday, June 4.  [facility] Admitting at 5:30 AM                Your surgery or procedure is scheduled for 7:30   Call this number if you have problems the morning of surgery: [phone]     Remember:  No food after midnight                     Take these medicines the morning of surgery with A SIP OF WATER : omeprazole (PRILOSEC)                   Stop taking Aspirin, Aspirin Products, herbal medications, Vitamins.  Do not take any NSAIDS ie:  Ibuprofen, Advil, Naproxen.  Special instructions:    [place]- Preparing For Surgery  Before surgery, you can play an important role. Because skin is not sterile, your skin needs to be as free of germs as possible. You can reduce the number of germs on your skin by washing with CHG (chlorahexidine gluconate) Soap before surgery.  CHG is an antiseptic cleaner which kills germs and bonds with the skin to continue killing germs even after washing.    Oral Hygiene is also important to reduce your risk of infection.  Remember - BRUSH YOUR TEETH THE MORNING OF SURGERY WITH YOUR REGULAR TOOTHPASTE  Please do not use if you have an allergy to CHG or antibacterial soaps. If your skin becomes reddened/irritated stop using the CHG.  Do not shave (including legs and underarms) for at least 48 hours prior to first CHG shower. It is OK to shave your face.  Please follow these instructions carefully.   1. Shower the NIGHT BEFORE SURGERY and the MORNING OF SURGERY with CHG.   2. If you chose to wash your hair, wash your hair first as usual with your normal shampoo.    3. After you shampoo, rinse your hair and body thoroughly to remove the shampoo.     Wash your face and private area with the soap you use at home, then rinse.  4. Use CHG as you would any other liquid soap. You can apply CHG directly to the skin and wash gently with a scrungie or a clean  washcloth.   5. Apply the CHG Soap to your body ONLY FROM THE NECK DOWN.  Do not use on open wounds or open sores. Avoid contact with your eyes, ears, mouth and genitals (private parts).   6. Wash thoroughly, paying special attention to the area where your surgery will be performed.  7. Thoroughly rinse your body with warm water from the neck down.  8. DO NOT shower/wash with your normal soap after using and rinsing off the CHG Soap.  9. Pat yourself dry with a CLEAN TOWEL.  10. Wear CLEAN PAJAMAS to bed the night before surgery, wear comfortable clothes the morning of surgery  11. Place CLEAN SHEETS on your bed the night of your first shower and DO NOT SLEEP WITH PETS.  Day of Surgery: Shower as above Do not apply any deodorants/lotions,powders or colognes.  Please wear clean clothes to the hospital/surgery center.   Remember to brush your teeth WITH YOUR REGULAR TOOTHPASTE.             Do not wear jewelry, make-up or nail polish.  Do not shave 48 hours  prior to surgery.  Men may shave face and neck.  Do not bring valuables to the hospital.  [facility] is not responsible for any belongings or valuables.  Contacts, dentures or bridgework may not be worn into surgery.  Leave your suitcase in the car.  After surgery it may be brought to your room.  For patients admitted to the hospital, discharge time will be determined by your treatment team.  Patients discharged the day of surgery will not be allowed to drive home.   Please read over the following fact sheets that you were given: Pain Booklet,   Incentive Spirometry, Surgical Site Infections.

## 2017-09-29 NOTE — Anesthesia Preprocedure Evaluation (Addendum)
Anesthesia Evaluation  Patient identified by MRN, date of birth, ID band Patient awake    Reviewed: Allergy & Precautions, NPO status , Patient's Chart, lab work & pertinent test results  Airway Mallampati: II  TM Distance: >3 FB Neck ROM: Full    Dental  (+) Dental Advisory Given, Edentulous Upper, Edentulous Lower   Pulmonary neg pulmonary ROS,    Pulmonary exam normal breath sounds clear to auscultation       Cardiovascular hypertension, Pt. on medications Normal cardiovascular exam Rhythm:Regular Rate:Normal  Echo 03/03/15: Study Conclusions  - Left ventricle: The cavity size was normal. Wall thickness was increased in a pattern of mild LVH. Systolic function was normal. The estimated ejection fraction was in the range of 55% to 60%. Wall motion was normal; there were no regional wall motion abnormalities. Doppler parameters are consistent with abnormal left ventricular relaxation (grade 1 diastolic dysfunction).   Neuro/Psych BLE weakness  Neuromuscular disease negative psych ROS   GI/Hepatic Neg liver ROS, GERD  Medicated and Controlled,  Endo/Other  Morbid obesity  Renal/GU negative Renal ROS     Musculoskeletal  (+) Arthritis ,   Abdominal   Peds  Hematology negative hematology ROS (+)   Anesthesia Other Findings Day of surgery medications reviewed with the patient.  Reproductive/Obstetrics                           Anesthesia Physical Anesthesia Plan  ASA: III  Anesthesia Plan: General   Post-op Pain Management:    Induction: Intravenous  PONV Risk Score and Plan: 3 and Midazolam, TIVA, Dexamethasone and Ondansetron  Airway Management Planned: Oral ETT  Additional Equipment:   Intra-op Plan:   Post-operative Plan: Possible Post-op intubation/ventilation  Informed Consent: I have reviewed the patients History and Physical, chart, labs and discussed the procedure  including the risks, benefits and alternatives for the proposed anesthesia with the patient or authorized representative who has indicated his/her understanding and acceptance.   Dental advisory given  Plan Discussed with: CRNA and Anesthesiologist  Anesthesia Plan Comments: (TIVA for neuro monitoring. Propofol infusion, Sufentanil infusion, Ketamine infusion.  2nd IV after induction, possible arterial line if needed (will decide after induction).)      Anesthesia Quick Evaluation

## 2017-09-30 ENCOUNTER — Encounter (HOSPITAL_COMMUNITY): Payer: Self-pay | Admitting: General Practice

## 2017-09-30 ENCOUNTER — Inpatient Hospital Stay (HOSPITAL_COMMUNITY): Payer: Medicare Other | Admitting: Anesthesiology

## 2017-09-30 ENCOUNTER — Inpatient Hospital Stay (HOSPITAL_COMMUNITY)
Admission: RE | Admit: 2017-09-30 | Discharge: 2017-10-05 | DRG: 455 | Disposition: A | Discharge status: Home / Self Care | Payer: Medicare Other | Attending: Orthopaedic Surgery | Admitting: Orthopaedic Surgery

## 2017-09-30 ENCOUNTER — Inpatient Hospital Stay (HOSPITAL_COMMUNITY): Payer: Medicare Other

## 2017-09-30 ENCOUNTER — Other Ambulatory Visit: Payer: Self-pay

## 2017-09-30 ENCOUNTER — Encounter (HOSPITAL_COMMUNITY)
Admission: RE | Disposition: A | Discharge status: Home / Self Care | Payer: Self-pay | Source: Home / Self Care | Attending: Specialist

## 2017-09-30 DIAGNOSIS — M4325 Fusion of spine, thoracolumbar region: Secondary | ICD-10-CM | POA: Diagnosis not present

## 2017-09-30 DIAGNOSIS — Z419 Encounter for procedure for purposes other than remedying health state, unspecified: Secondary | ICD-10-CM

## 2017-09-30 DIAGNOSIS — Z8042 Family history of malignant neoplasm of prostate: Secondary | ICD-10-CM | POA: Diagnosis not present

## 2017-09-30 DIAGNOSIS — M4804 Spinal stenosis, thoracic region: Secondary | ICD-10-CM | POA: Diagnosis present

## 2017-09-30 DIAGNOSIS — M5124 Other intervertebral disc displacement, thoracic region: Secondary | ICD-10-CM | POA: Diagnosis present

## 2017-09-30 DIAGNOSIS — M48062 Spinal stenosis, lumbar region with neurogenic claudication: Principal | ICD-10-CM | POA: Diagnosis present

## 2017-09-30 DIAGNOSIS — Z23 Encounter for immunization: Secondary | ICD-10-CM

## 2017-09-30 DIAGNOSIS — Z981 Arthrodesis status: Secondary | ICD-10-CM | POA: Diagnosis not present

## 2017-09-30 DIAGNOSIS — Z888 Allergy status to other drugs, medicaments and biological substances status: Secondary | ICD-10-CM | POA: Diagnosis not present

## 2017-09-30 DIAGNOSIS — Z90710 Acquired absence of both cervix and uterus: Secondary | ICD-10-CM | POA: Diagnosis not present

## 2017-09-30 DIAGNOSIS — K219 Gastro-esophageal reflux disease without esophagitis: Secondary | ICD-10-CM | POA: Diagnosis present

## 2017-09-30 DIAGNOSIS — Z803 Family history of malignant neoplasm of breast: Secondary | ICD-10-CM | POA: Diagnosis not present

## 2017-09-30 DIAGNOSIS — Z800 Family history of malignant neoplasm of digestive organs: Secondary | ICD-10-CM | POA: Diagnosis not present

## 2017-09-30 DIAGNOSIS — E785 Hyperlipidemia, unspecified: Secondary | ICD-10-CM | POA: Diagnosis present

## 2017-09-30 DIAGNOSIS — Z880 Allergy status to penicillin: Secondary | ICD-10-CM

## 2017-09-30 DIAGNOSIS — M5116 Intervertebral disc disorders with radiculopathy, lumbar region: Secondary | ICD-10-CM | POA: Diagnosis present

## 2017-09-30 DIAGNOSIS — Z8349 Family history of other endocrine, nutritional and metabolic diseases: Secondary | ICD-10-CM | POA: Diagnosis not present

## 2017-09-30 DIAGNOSIS — Z8249 Family history of ischemic heart disease and other diseases of the circulatory system: Secondary | ICD-10-CM

## 2017-09-30 DIAGNOSIS — M4316 Spondylolisthesis, lumbar region: Secondary | ICD-10-CM | POA: Diagnosis present

## 2017-09-30 DIAGNOSIS — Z96653 Presence of artificial knee joint, bilateral: Secondary | ICD-10-CM | POA: Diagnosis present

## 2017-09-30 DIAGNOSIS — I10 Essential (primary) hypertension: Secondary | ICD-10-CM | POA: Diagnosis present

## 2017-09-30 DIAGNOSIS — M5126 Other intervertebral disc displacement, lumbar region: Secondary | ICD-10-CM | POA: Diagnosis present

## 2017-09-30 DIAGNOSIS — M5125 Other intervertebral disc displacement, thoracolumbar region: Secondary | ICD-10-CM | POA: Diagnosis present

## 2017-09-30 DIAGNOSIS — Z8041 Family history of malignant neoplasm of ovary: Secondary | ICD-10-CM

## 2017-09-30 DIAGNOSIS — M4805 Spinal stenosis, thoracolumbar region: Secondary | ICD-10-CM | POA: Diagnosis not present

## 2017-09-30 LAB — TYPE AND SCREEN
ABO/RH(D): AB POS
Antibody Screen: NEGATIVE

## 2017-09-30 SURGERY — POSTERIOR LUMBAR FUSION 3 LEVEL
Anesthesia: General | Site: Back

## 2017-09-30 MED ORDER — METHOCARBAMOL 1000 MG/10ML IJ SOLN
500.0000 mg | Freq: Four times a day (QID) | INTRAVENOUS | Status: DC | PRN
  Filled 2017-09-30: qty 5

## 2017-09-30 MED ORDER — BUPIVACAINE LIPOSOME 1.3 % IJ SUSP
20.0000 mL | Freq: Once | INTRAMUSCULAR | Status: AC
  Administered 2017-09-30: 10 mL
  Filled 2017-09-30: qty 20

## 2017-09-30 MED ORDER — NAPHAZOLINE-GLYCERIN 0.012-0.2 % OP SOLN
1.0000 [drp] | Freq: Four times a day (QID) | OPHTHALMIC | Status: DC | PRN
  Administered 2017-09-30 – 2017-10-03 (×3): 1 [drp] via OPHTHALMIC
  Filled 2017-09-30: qty 15

## 2017-09-30 MED ORDER — SUFENTANIL CITRATE 250 MCG/5ML IV SOLN
0.2500 ug/kg/h | INTRAVENOUS | Status: DC
  Administered 2017-09-30: .2 ug/kg/h via INTRAVENOUS
  Filled 2017-09-30: qty 5

## 2017-09-30 MED ORDER — SIMVASTATIN 20 MG PO TABS
20.0000 mg | ORAL_TABLET | Freq: Every day | ORAL | Status: DC
  Administered 2017-09-30 – 2017-10-04 (×5): 20 mg via ORAL
  Filled 2017-09-30 (×5): qty 1

## 2017-09-30 MED ORDER — OMEGA-3-ACID ETHYL ESTERS 1 G PO CAPS
1.0000 g | ORAL_CAPSULE | Freq: Every day | ORAL | Status: DC
  Administered 2017-10-01 – 2017-10-05 (×5): 1 g via ORAL
  Filled 2017-09-30 (×5): qty 1

## 2017-09-30 MED ORDER — LIDOCAINE HCL (CARDIAC) PF 100 MG/5ML IV SOSY
PREFILLED_SYRINGE | INTRAVENOUS | Status: DC | PRN
  Administered 2017-09-30: 100 mg via INTRAVENOUS

## 2017-09-30 MED ORDER — LACTATED RINGERS IV SOLN
INTRAVENOUS | Status: DC
  Administered 2017-09-30 (×3): via INTRAVENOUS

## 2017-09-30 MED ORDER — THROMBIN 20000 UNITS EX SOLR
CUTANEOUS | Status: AC
  Filled 2017-09-30: qty 20000

## 2017-09-30 MED ORDER — SURGIFOAM 100 EX MISC
CUTANEOUS | Status: DC | PRN
  Administered 2017-09-30: 08:00:00 via TOPICAL

## 2017-09-30 MED ORDER — DOCUSATE SODIUM 100 MG PO CAPS
100.0000 mg | ORAL_CAPSULE | Freq: Two times a day (BID) | ORAL | Status: DC
  Administered 2017-09-30 – 2017-10-05 (×10): 100 mg via ORAL
  Filled 2017-09-30 (×10): qty 1

## 2017-09-30 MED ORDER — PNEUMOCOCCAL VAC POLYVALENT 25 MCG/0.5ML IJ INJ
0.5000 mL | INJECTION | INTRAMUSCULAR | Status: AC
Start: 2017-10-01 — End: 2017-10-01
  Administered 2017-10-01: 0.5 mL via INTRAMUSCULAR
  Filled 2017-09-30: qty 0.5

## 2017-09-30 MED ORDER — MIDAZOLAM HCL 2 MG/2ML IJ SOLN
INTRAMUSCULAR | Status: AC
  Filled 2017-09-30: qty 2

## 2017-09-30 MED ORDER — FLEET ENEMA 7-19 GM/118ML RE ENEM: 1.0000 | ENEMA | Freq: Once | RECTAL | Status: DC | PRN

## 2017-09-30 MED ORDER — ACETAMINOPHEN 325 MG PO TABS
650.0000 mg | ORAL_TABLET | ORAL | Status: DC | PRN
  Administered 2017-10-03: 650 mg via ORAL
  Filled 2017-09-30: qty 2

## 2017-09-30 MED ORDER — METHOCARBAMOL 500 MG PO TABS
500.0000 mg | ORAL_TABLET | Freq: Four times a day (QID) | ORAL | Status: DC | PRN
  Administered 2017-10-04 – 2017-10-05 (×2): 500 mg via ORAL
  Filled 2017-09-30 (×2): qty 1

## 2017-09-30 MED ORDER — SODIUM CHLORIDE 0.9 % IV SOLN
INTRAVENOUS | Status: DC | PRN
  Administered 2017-09-30: 13:00:00 via INTRAVENOUS

## 2017-09-30 MED ORDER — SUCCINYLCHOLINE CHLORIDE 200 MG/10ML IV SOSY
PREFILLED_SYRINGE | INTRAVENOUS | Status: AC
  Filled 2017-09-30: qty 10

## 2017-09-30 MED ORDER — ONDANSETRON HCL 4 MG/2ML IJ SOLN: 4.0000 mg | Freq: Once | INTRAMUSCULAR | Status: DC | PRN

## 2017-09-30 MED ORDER — PROPOFOL 500 MG/50ML IV EMUL
INTRAVENOUS | Status: DC | PRN
  Administered 2017-09-30: 50 ug/kg/min via INTRAVENOUS

## 2017-09-30 MED ORDER — HEMOSTATIC AGENTS (NO CHARGE) OPTIME
TOPICAL | Status: DC | PRN
  Administered 2017-09-30: 1 via TOPICAL

## 2017-09-30 MED ORDER — BISACODYL 5 MG PO TBEC: 5.0000 mg | DELAYED_RELEASE_TABLET | Freq: Every day | ORAL | Status: DC | PRN

## 2017-09-30 MED ORDER — ROCURONIUM BROMIDE 50 MG/5ML IV SOLN
INTRAVENOUS | Status: AC
  Filled 2017-09-30: qty 1

## 2017-09-30 MED ORDER — KETOROLAC TROMETHAMINE 15 MG/ML IJ SOLN
7.5000 mg | Freq: Four times a day (QID) | INTRAMUSCULAR | Status: AC
  Administered 2017-09-30 – 2017-10-01 (×4): 7.5 mg via INTRAVENOUS
  Filled 2017-09-30 (×4): qty 1

## 2017-09-30 MED ORDER — PHENYLEPHRINE HCL 10 MG/ML IJ SOLN: INTRAVENOUS | Status: DC | PRN

## 2017-09-30 MED ORDER — ASPIRIN EC 81 MG PO TBEC
81.0000 mg | DELAYED_RELEASE_TABLET | Freq: Every day | ORAL | Status: DC
  Administered 2017-10-01 – 2017-10-05 (×5): 81 mg via ORAL
  Filled 2017-09-30 (×5): qty 1

## 2017-09-30 MED ORDER — ONDANSETRON HCL 4 MG PO TABS: 4.0000 mg | ORAL_TABLET | Freq: Four times a day (QID) | ORAL | Status: DC | PRN

## 2017-09-30 MED ORDER — PANTOPRAZOLE SODIUM 40 MG PO TBEC
40.0000 mg | DELAYED_RELEASE_TABLET | Freq: Every day | ORAL | Status: DC
  Administered 2017-10-01 – 2017-10-05 (×5): 40 mg via ORAL
  Filled 2017-09-30 (×5): qty 1

## 2017-09-30 MED ORDER — SODIUM CHLORIDE 0.9 % IV SOLN
1250.0000 mg | Freq: Once | INTRAVENOUS | Status: AC
  Administered 2017-09-30: 1250 mg via INTRAVENOUS
  Filled 2017-09-30 (×2): qty 1250

## 2017-09-30 MED ORDER — OXYCODONE HCL ER 10 MG PO T12A
10.0000 mg | EXTENDED_RELEASE_TABLET | Freq: Two times a day (BID) | ORAL | Status: DC
  Administered 2017-09-30 – 2017-10-05 (×10): 10 mg via ORAL
  Filled 2017-09-30 (×10): qty 1

## 2017-09-30 MED ORDER — 0.9 % SODIUM CHLORIDE (POUR BTL) OPTIME
TOPICAL | Status: DC | PRN
  Administered 2017-09-30: 1000 mL

## 2017-09-30 MED ORDER — FENTANYL CITRATE (PF) 100 MCG/2ML IJ SOLN
INTRAMUSCULAR | Status: DC | PRN
  Administered 2017-09-30: 100 ug via INTRAVENOUS
  Administered 2017-09-30 (×3): 50 ug via INTRAVENOUS

## 2017-09-30 MED ORDER — FAMOTIDINE 20 MG PO TABS
20.0000 mg | ORAL_TABLET | Freq: Every day | ORAL | Status: DC
  Administered 2017-09-30 – 2017-10-05 (×6): 20 mg via ORAL
  Filled 2017-09-30 (×7): qty 1

## 2017-09-30 MED ORDER — BUPIVACAINE HCL 0.5 % IJ SOLN
INTRAMUSCULAR | Status: DC | PRN
  Administered 2017-09-30: 10 mL

## 2017-09-30 MED ORDER — PROPOFOL 10 MG/ML IV BOLUS
INTRAVENOUS | Status: DC | PRN
  Administered 2017-09-30: 100 mg via INTRAVENOUS
  Administered 2017-09-30: 160 mg via INTRAVENOUS

## 2017-09-30 MED ORDER — CHLORHEXIDINE GLUCONATE 4 % EX LIQD: 60.0000 mL | Freq: Once | CUTANEOUS | Status: DC

## 2017-09-30 MED ORDER — SUCCINYLCHOLINE CHLORIDE 200 MG/10ML IV SOSY
PREFILLED_SYRINGE | INTRAVENOUS | Status: DC | PRN
  Administered 2017-09-30: 120 mg via INTRAVENOUS

## 2017-09-30 MED ORDER — ADULT MULTIVITAMIN W/MINERALS CH
1.0000 | ORAL_TABLET | Freq: Every day | ORAL | Status: DC
  Administered 2017-10-01 – 2017-10-05 (×5): 1 via ORAL
  Filled 2017-09-30 (×5): qty 1

## 2017-09-30 MED ORDER — FENTANYL CITRATE (PF) 250 MCG/5ML IJ SOLN
INTRAMUSCULAR | Status: AC
  Filled 2017-09-30: qty 5

## 2017-09-30 MED ORDER — PHENYLEPHRINE 40 MCG/ML (10ML) SYRINGE FOR IV PUSH (FOR BLOOD PRESSURE SUPPORT)
PREFILLED_SYRINGE | INTRAVENOUS | Status: DC | PRN
  Administered 2017-09-30 (×4): 40 ug via INTRAVENOUS

## 2017-09-30 MED ORDER — DEXAMETHASONE SODIUM PHOSPHATE 10 MG/ML IJ SOLN
INTRAMUSCULAR | Status: DC | PRN
  Administered 2017-09-30: 10 mg via INTRAVENOUS

## 2017-09-30 MED ORDER — MENTHOL 3 MG MT LOZG: 1.0000 | LOZENGE | OROMUCOSAL | Status: DC | PRN

## 2017-09-30 MED ORDER — PHENOL 1.4 % MT LIQD: 1.0000 | OROMUCOSAL | Status: DC | PRN

## 2017-09-30 MED ORDER — OXYCODONE HCL 5 MG PO TABS
10.0000 mg | ORAL_TABLET | ORAL | Status: DC | PRN
Start: 2017-09-30 — End: 2017-10-05
  Administered 2017-10-03 (×2): 10 mg via ORAL
  Filled 2017-09-30 (×2): qty 2

## 2017-09-30 MED ORDER — ALUM & MAG HYDROXIDE-SIMETH 200-200-20 MG/5ML PO SUSP: 30.0000 mL | Freq: Four times a day (QID) | ORAL | Status: DC | PRN

## 2017-09-30 MED ORDER — SODIUM CHLORIDE 0.9% FLUSH
3.0000 mL | Freq: Two times a day (BID) | INTRAVENOUS | Status: DC
  Administered 2017-09-30 – 2017-10-03 (×6): 3 mL via INTRAVENOUS

## 2017-09-30 MED ORDER — SODIUM CHLORIDE 0.9% FLUSH: 3.0000 mL | INTRAVENOUS | Status: DC | PRN

## 2017-09-30 MED ORDER — HYDROCODONE-ACETAMINOPHEN 7.5-325 MG PO TABS
1.0000 | ORAL_TABLET | ORAL | Status: DC | PRN
  Administered 2017-10-01 – 2017-10-02 (×2): 1 via ORAL
  Filled 2017-09-30 (×2): qty 1

## 2017-09-30 MED ORDER — AMLODIPINE BESYLATE 5 MG PO TABS
5.0000 mg | ORAL_TABLET | Freq: Every day | ORAL | Status: DC
  Administered 2017-09-30 – 2017-10-04 (×5): 5 mg via ORAL
  Filled 2017-09-30 (×5): qty 1

## 2017-09-30 MED ORDER — ONDANSETRON HCL 4 MG/2ML IJ SOLN: 4.0000 mg | Freq: Four times a day (QID) | INTRAMUSCULAR | Status: DC | PRN

## 2017-09-30 MED ORDER — ONDANSETRON HCL 4 MG/2ML IJ SOLN
INTRAMUSCULAR | Status: AC
  Filled 2017-09-30: qty 2

## 2017-09-30 MED ORDER — PROPOFOL 10 MG/ML IV BOLUS
INTRAVENOUS | Status: AC
  Filled 2017-09-30: qty 20

## 2017-09-30 MED ORDER — GABAPENTIN 300 MG PO CAPS
300.0000 mg | ORAL_CAPSULE | Freq: Three times a day (TID) | ORAL | Status: DC
  Administered 2017-09-30 – 2017-10-05 (×14): 300 mg via ORAL
  Filled 2017-09-30 (×14): qty 1

## 2017-09-30 MED ORDER — FENTANYL CITRATE (PF) 100 MCG/2ML IJ SOLN: 25.0000 ug | INTRAMUSCULAR | Status: DC | PRN

## 2017-09-30 MED ORDER — SODIUM CHLORIDE 0.9 % IV SOLN
0.1000 mg/kg/h | INTRAVENOUS | Status: DC
  Administered 2017-09-30: .2 mg/kg/h via INTRAVENOUS
  Filled 2017-09-30: qty 2

## 2017-09-30 MED ORDER — ACETAMINOPHEN 650 MG RE SUPP: 650.0000 mg | RECTAL | Status: DC | PRN

## 2017-09-30 MED ORDER — SODIUM CHLORIDE 0.9 % IV SOLN
INTRAVENOUS | Status: DC
  Administered 2017-09-30 – 2017-10-01 (×3): via INTRAVENOUS

## 2017-09-30 MED ORDER — PHENYLEPHRINE HCL 10 MG/ML IJ SOLN
INTRAVENOUS | Status: DC | PRN
  Administered 2017-09-30: 40 ug/min via INTRAVENOUS

## 2017-09-30 MED ORDER — SODIUM CHLORIDE 0.9 % IV SOLN: 250.0000 mL | INTRAVENOUS | Status: DC

## 2017-09-30 MED ORDER — LACTATED RINGERS IV SOLN
INTRAVENOUS | Status: DC | PRN
  Administered 2017-09-30: 09:00:00 via INTRAVENOUS

## 2017-09-30 MED ORDER — ONDANSETRON HCL 4 MG/2ML IJ SOLN
INTRAMUSCULAR | Status: DC | PRN
  Administered 2017-09-30: 4 mg via INTRAVENOUS

## 2017-09-30 MED ORDER — BUPIVACAINE HCL (PF) 0.5 % IJ SOLN
INTRAMUSCULAR | Status: AC
  Filled 2017-09-30: qty 30

## 2017-09-30 MED ORDER — MIDAZOLAM HCL 5 MG/5ML IJ SOLN
INTRAMUSCULAR | Status: DC | PRN
  Administered 2017-09-30: 2 mg via INTRAVENOUS

## 2017-09-30 MED ORDER — POLYETHYLENE GLYCOL 3350 17 G PO PACK
17.0000 g | PACK | Freq: Every day | ORAL | Status: DC | PRN
  Administered 2017-10-03: 17 g via ORAL
  Filled 2017-09-30: qty 1

## 2017-09-30 SURGICAL SUPPLY — 83 items
BLADE CLIPPER SURG (BLADE) IMPLANT
BONE CANC CHIPS 20CC PCAN1/4 (Bone Implant) ×3 IMPLANT
BONE VIVIGEN FORMABLE 10CC (Bone Implant) ×3 IMPLANT
BUR MATCHSTICK NEURO 3.0 LAGG (BURR) ×3 IMPLANT
BUR NEURO DRILL SOFT 3.0X3.8M (BURR) IMPLANT
BUR PRESCISION 1.7 ELITE (BURR) IMPLANT
BUR RND FLUTED 2.5 (BURR) IMPLANT
CHIPS CANC BONE 20CC PCAN1/4 (Bone Implant) ×1 IMPLANT
COVER BACK TABLE 80X110 HD (DRAPES) ×3 IMPLANT
COVER MAYO STAND STRL (DRAPES) IMPLANT
COVER SURGICAL LIGHT HANDLE (MISCELLANEOUS) IMPLANT
CROSSLINK EXPEDIUM SFX A1 (Neuro Prosthesis/Implant) ×3 IMPLANT
DERMABOND ADVANCED (GAUZE/BANDAGES/DRESSINGS) ×2
DERMABOND ADVANCED .7 DNX12 (GAUZE/BANDAGES/DRESSINGS) ×1 IMPLANT
DRAPE C-ARM 42X72 X-RAY (DRAPES) ×3 IMPLANT
DRAPE C-ARMOR (DRAPES) ×3 IMPLANT
DRAPE MICROSCOPE LEICA (MISCELLANEOUS) ×3 IMPLANT
DRAPE POUCH INSTRU U-SHP 10X18 (DRAPES) ×3 IMPLANT
DRAPE SURG 17X23 STRL (DRAPES) ×12 IMPLANT
DRSG MEPILEX BORDER 4X8 (GAUZE/BANDAGES/DRESSINGS) ×3 IMPLANT
DURAPREP 26ML APPLICATOR (WOUND CARE) ×3 IMPLANT
ELECT BLADE 4.0 EZ CLEAN MEGAD (MISCELLANEOUS) ×3
ELECT BLADE 6.5 EXT (BLADE) ×3 IMPLANT
ELECT CAUTERY BLADE 6.4 (BLADE) ×3 IMPLANT
ELECT REM PT RETURN 9FT ADLT (ELECTROSURGICAL) ×3
ELECTRODE BLDE 4.0 EZ CLN MEGD (MISCELLANEOUS) ×1 IMPLANT
ELECTRODE REM PT RTRN 9FT ADLT (ELECTROSURGICAL) ×1 IMPLANT
EVACUATOR 1/8 PVC DRAIN (DRAIN) IMPLANT
FEE INTRAOP MONITOR IMPULS NCS (MISCELLANEOUS) ×1 IMPLANT
GLOVE BIOGEL PI IND STRL 6.5 (GLOVE) ×2 IMPLANT
GLOVE BIOGEL PI IND STRL 8 (GLOVE) ×2 IMPLANT
GLOVE BIOGEL PI INDICATOR 6.5 (GLOVE) ×4
GLOVE BIOGEL PI INDICATOR 8 (GLOVE) ×4
GLOVE ECLIPSE 9.0 STRL (GLOVE) ×6 IMPLANT
GLOVE ORTHO TXT STRL SZ7.5 (GLOVE) ×6 IMPLANT
GLOVE SURG 8.5 LATEX PF (GLOVE) ×6 IMPLANT
GLOVE SURG SS PI 7.5 STRL IVOR (GLOVE) ×18 IMPLANT
GOWN STRL REUS W/ TWL LRG LVL3 (GOWN DISPOSABLE) ×2 IMPLANT
GOWN STRL REUS W/TWL 2XL LVL3 (GOWN DISPOSABLE) ×6 IMPLANT
GOWN STRL REUS W/TWL LRG LVL3 (GOWN DISPOSABLE) ×4
INTRAOP MONITOR FEE IMPULS NCS (MISCELLANEOUS) ×1
INTRAOP MONITOR FEE IMPULSE (MISCELLANEOUS) ×2
KIT BASIN OR (CUSTOM PROCEDURE TRAY) ×3 IMPLANT
KIT POSITION SURG JACKSON T1 (MISCELLANEOUS) ×3 IMPLANT
KIT TURNOVER KIT B (KITS) ×3 IMPLANT
MILL MEDIUM DISP (BLADE) ×3 IMPLANT
NEEDLE 22X1 1/2 (OR ONLY) (NEEDLE) ×3 IMPLANT
NEEDLE SPNL 18GX3.5 QUINCKE PK (NEEDLE) ×3 IMPLANT
NS IRRIG 1000ML POUR BTL (IV SOLUTION) ×3 IMPLANT
PACK LAMINECTOMY ORTHO (CUSTOM PROCEDURE TRAY) ×3 IMPLANT
PAD ARMBOARD 7.5X6 YLW CONV (MISCELLANEOUS) ×12 IMPLANT
PATTIES SURGICAL .75X.75 (GAUZE/BANDAGES/DRESSINGS) IMPLANT
PATTIES SURGICAL 1X1 (DISPOSABLE) IMPLANT
PROBE BALL TIP PED SCREW 2.3M (INSTRUMENTS) ×3 IMPLANT
ROD EXPEDIUM 300MM (Rod) ×6 IMPLANT
SCREW CORT FIX FEN 5.5X7X40MM (Screw) ×18 IMPLANT
SCREW SET SINGLE INNER (Screw) ×45 IMPLANT
SCREW VIPER 8X55MM (Screw) ×3 IMPLANT
SCREW VIPER FEN POLY 8X40MM (Screw) ×3 IMPLANT
SPACER CONCORDE PRO 9X7X27MM (Spacer) ×3 IMPLANT
SPONGE LAP 4X18 RFD (DISPOSABLE) ×9 IMPLANT
SPONGE SURGIFOAM ABS GEL 100 (HEMOSTASIS) ×3 IMPLANT
STAPLER VISISTAT 35W (STAPLE) ×3 IMPLANT
SURGIFLO W/THROMBIN 8M KIT (HEMOSTASIS) ×3 IMPLANT
SUT VIC AB 0 CT1 27 (SUTURE) ×6
SUT VIC AB 0 CT1 27XBRD ANBCTR (SUTURE) ×3 IMPLANT
SUT VIC AB 1 CTX 27 (SUTURE) ×3 IMPLANT
SUT VIC AB 1 CTX 36 (SUTURE) ×2
SUT VIC AB 1 CTX36XBRD ANBCTR (SUTURE) ×1 IMPLANT
SUT VIC AB 2-0 CT1 27 (SUTURE) ×2
SUT VIC AB 2-0 CT1 TAPERPNT 27 (SUTURE) ×1 IMPLANT
SUT VIC AB 3-0 X1 27 (SUTURE) ×3 IMPLANT
SYR 20CC LL (SYRINGE) ×3 IMPLANT
SYR CONTROL 10ML LL (SYRINGE) ×3 IMPLANT
TAP CANN VIPER2 DL 5.0 (TAP) ×3 IMPLANT
TAP CANN VIPER2 DL 6.0 (TAP) ×3 IMPLANT
TAP CANN VIPER2 DL 7.0 (TAP) ×3 IMPLANT
TAP VIPER MIS 4.35MM (TAP) ×3 IMPLANT
TOWEL GREEN STERILE (TOWEL DISPOSABLE) ×3 IMPLANT
TOWEL GREEN STERILE FF (TOWEL DISPOSABLE) ×3 IMPLANT
TRAY FOLEY MTR SLVR 16FR STAT (SET/KITS/TRAYS/PACK) ×3 IMPLANT
WATER STERILE IRR 1000ML POUR (IV SOLUTION) ×3 IMPLANT
YANKAUER SUCT BULB TIP NO VENT (SUCTIONS) ×3 IMPLANT

## 2017-09-30 NOTE — Op Note (Signed)
09/30/2017  3:07 PM  PATIENT:  [name]  72 y.o. 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  REVISION OF LEFT L4 PEDICLE SCREW.   SURGEON:  Jessy Oto, MD     ASSISTANT:  Benjiman Core, PA-C  (Present throughout the entire procedure and necessary for completion of procedure in a timely manner)     ANESTHESIA:  General, Supplemental local marcaine and exparel 1:1 total 30. Kerrie Pleasure, CRNA    COMPLICATIONS:  None.   EBL: 500CC  CELL SAVER RETURNED: 150CC    COMPONENTS:  Implant Name Type Inv. Item Serial No. Manufacturer Lot No. LRB No. Used  SCREW CORTICAL VIPER 7X40MM - [id number] Screw SCREW CORTICAL VIPER 7X40MM  [employer]  N/A 1  BONE VIVIGEN FORMABLE 10CC - [id number] Bone Implant BONE VIVIGEN FORMABLE 10CC [id number] [facility]  N/A 1  BONE CANC CHIPS 20CC - [id number] Bone Implant BONE CANC CHIPS 20CC [id number] [facility]  N/A 1  SPACER CONCORDE PRO 9X7X27MM - [id number] Spacer SPACER CONCORDE PRO 9X7X27MM  [employer]  N/A 1  SCREW CORT FIX FEN 5.5X7X40MM - [id number] Screw SCREW CORT FIX FEN 5.5X7X40MM  [employer]  N/A 6  ROD EXPEDIUM 300MM - [id number] Rod ROD EXPEDIUM 300MM  [employer]  N/A 2  SCREW VIPER 8X55MM - [id number] Screw SCREW VIPER 8X55MM  [employer]  N/A 1  SCREW SET SINGLE INNER - [id number] Screw SCREW SET SINGLE INNER  [employer]  N/A 15  CROSSLINK EXPEDIUM SFX A1 -  [id number] Neuro Prosthesis/Implant CROSSLINK EXPEDIUM SFX A1  [employer]  N/A 1     INTRA NEUROMONITORING STUDIES: T10   Left 20  Right 20+ T11   Left 20  Right 20+ T12   Left 20  Right 20+   No intraoperative neuromonitoring changes noted during the entire case including both sensory evoked potentials and motor evoked potentials.     PROCEDURE:    The patient was met in the holding area, and the appropriate T10 to Right T12-L1 Thoracolumbar levels identified and marked with an "X" and my initials. I had discussion with the patient in the preop holding area regarding consent form. 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  The patient was then transported to OR and was placed under general anestheticwithout difficulty. The patient received appropriate preoperative antibiotic prophylaxis vancomycin 1 gm.  Nursing staff inserted a Foley catheter under sterile conditions. The patient was then turned to a prone position using the [employer] spine frame. PAS. all pressure points well padded the arms at the side to 90 90. Standard prep with DuraPrep solution draped in the usual manner from the mid dorsal spine to the sacral segment. Iodine Vi-Drape was used and the incision was marked. Time-out procedure was called and correct. Loupe magnification and headlight were used during this  portion procedure.  Skin in the midline between D9 and L5 was then infiltrated with local marcaine 0.5% 1:1 exparel 1.3% total of 30 cc used. Incision was then made through the skin and subcutaneous layers ellipsing the old incision scar superior 2/3rds and taken down to the patient's lumbodorsal fascia and spinous processes. The incision then carried sharply along the  supraspinous ligament and then continuing the lateral aspects of the spinous processes T10, T11, T12,L1 and out to the hardware mPACT screws at L1 and L2 and right inter rod connecting sleeve at the first lateral hardware between L3 and L4 on the right and the left  Pedicle screws and rods exposed at L2 and L4 with the left intervening rod and carried lateral over the lateral facets at L2-3 and L4 and L5.Belenda Cruise elevators used to carefully elevate the paralumbar muscles off of the posterior elements using electrocautery carefully drilled bleeding and perform dissection of the muscle tissues of the resecting the facet capsules at T10-T11, T11-T12 and T12-L1. Continuing the exposure out laterally to expose the retained pedicle screws and rods. Bleeding controlled using electrocautery monopolar electrocautery.  Viper retractor was used for the lower part of the incision. Cerebellar retractors in the cephalad portion of the incision.    C-arm fluoroscopy was draped sterilely then brought into the field.Using AP C-arm fluoroscopy then a hole made into the central aspect of the left pedicle of T12 observed in the pedicle using ball-tipped nerve hook and hockey stick nerve probe initial entry was determined on fluoroscopy to be good position alignment so that a ball handled probe was then used to probe the left T12 pedicle to a depth of nearly 40 mm observed on C-arm fluoroscopy to be beyond the midpoint of the lumbar vertebra and then position alignment within the left T12 pedicle this was then removed and the pedicle channel probed demonstrating patency no sign of rupture the cortex of the pedicle. Tapping with a 5 mm screw tap then a 53m and then 7 mm tap and testing soft tissue electrical resistance a 7.0 mm x 40 mm screw was placed on the left side at the T12 level. Using C-arm fluoroscopy then a hole made into the middle of the pedicle of right T12 observed in the pedicle using ball tipped nerve hook and hockey  stick nerve probe initial entry was determined on fluoroscopy to be good position alignment so that a ball handled probe was then used to probe the right T12 pedicle opening to a depth of nearly 40 mm observed on C-arm fluoroscopy to be beyond the midpoint of the lumbar vertebra and then position alignment within the right T12 pedicle this was then removed and the pedicle channel probed demonstrating patency no sign of rupture the cortex of the pedicle. Tapping with a 5 mm screw tap and then a 6.0 tap and then a 7.052mtap, a 7.0 mm x 40 mm screw was placed on the table for placement after performing TLIF on the right side a T12-L1 this screw will be place on the right side at the T12 level. Using C-arm fluoroscopy then a hole made into the lateral aspect of the left pedicle of T11 observed in the pedicle using ball tipped nerve hook and hockey stick nerve probe initial entry was determined on fluoroscopy to be good position alignment so that a ball handled probe was then used to probe the left T11 pedicle to a depth of nearly 40 mm observed on C-arm fluoroscopy to be  well aligned within the left T11 pedicle, the pedicle channel probed demonstrating patency no sign of rupture the cortex of the pedicle. Tapping with a 5 mm screw tap,then a 6.0 mm tap and then a 7.0 mm tap, a 7.0 mm x 40 mm screw was placed  on the left side at the T11 level. C-arm fluoroscopy was used to localize the hole made in the central aspect of the pedicle of T11 on the right localizing the pedicle within the opening with a ball tipped probe and ball tipped probe carefully passed down the center of the T11 pedicle to a depth of nearly 40 mm. Observed on C-arm fluoroscopy to be in good position alignment channel was probed with a ball-tipped probe ensure patency no sign of cortical disruption. Tapping with a 5 mm screw tap, then a 6.0 mm tap and then a 7.17m tap, then a 7.0 mm x 40 mm screw was placed  on the right side at the T11 level. Using  C-arm fluoroscopy then a hole made into the centrall aspect of the left pedicle of T10 observed in the pedicle using ball-tipped nerve hook and hockey stick nerve probe initial entry was determined on fluoroscopy to be good position alignment so that a ball handled probe was then used to probe the left T10 pedicle to a depth of nearly 40 mm observed on C-arm fluoroscopy to be beyond the midpoint of the lumbar vertebra and then position alignment within the left T10 pedicle this was then removed and the pedicle channel probed demonstrating patency no sign of rupture the cortex of the pedicle. Tapping with a 5 mm screw tap then a 6.0 mm tap and then a 7.051mtap, then a 7.0 mm x 40 mm screw was placed on the left side at the T10 level. Using C-arm fluoroscopy then a hole made into the middle of the pedicle of right T10 observed in the pedicle using ball tipped nerve hook and hockey stick nerve probe initial entry was determined on fluoroscopy to be good position alignment so that a ball handled probe was then used to probe the right T10 pedicle opening to a depth of nearly 40 mm observed on C-arm fluoroscopy to be beyond the midpoint of the lumbar vertebra and then position alignment within the right T10 pedicle this was then removed and the pedicle channel probed demonstrating patency no sign of rupture the cortex of the pedicle. Tapping with a 5 mm screw tap.then a 6.24m54map and then a 7.24mm58mp, then a 7.0 mm x 40 mm screw was placed on the right side at the T10 level. Intraoperative neuromonitoring was performed throughout the case and resistance testing then carried out of each of the pedicle screws the resistance value for each screw  listed above.    The right inferior articular process of T12 was resected in order to provide for exposure of the right side T12-L1 neuroforamen for ease of placement of TLIFs (transforaminal lumbar interbody fusion) essential portions of the lamina were also resected first  beginning with the Leksell rongeur and then resecting using 2 and 3 mm Kerrison the central and right portions of the lamina of T12 performing foraminotomies on the right side at the T12 level. The superior articular process L1 was resected on the right side. The L1 nerve root identified bilaterally at the medial aspect of the L1 pedicle. Superior articular process of L1 was then resected from the right side further decompressing the right T12 nerve and providing  for exposure of the area just superior to the L1 pedicle for a placement of cage. A  disc herniation was found on the right side at the T12-L1 level and this was resected. The OR microscope was draped sterilely and brought into the field to allow for freeing up of the right side of the thecal sac at the T12-L1 Level elevating the L1 nerve root away from the medial right L1 pedicle, exposing the right T12-L1 disc above the right L1 pedicle, Incising the disc and then resecting the disc with micropituitary and then pituitary with teeth.The right side recut resecting the superior articular process of L1 overlying the T12 nerve root as it exited at the T12-L1 level decompressing the lateral recess along the medial aspect of the pedicle of L1 and resecting the superior to the process of L1 and decompressing the right T12 neuroforamen.The disc herniation did continue subligamentous along the right side of the Posterior upper vertebral body of L1 ventral to the right L1 nerve root. The posterior longitudinal ligament was incised and the disc herniation material resected with nerve hook and micropituitary. A blunt nerve hook used to explore the spinal canal and ensure Both the right T12 and L1 nerve roots were well decompressed.  Attention then turned to placement of the right transforaminal lumbar interbody fusion cages. Using a Penfield 4 the lateral aspect of the thecal sac and the inferior aspect of the L1 nerve root on the right side at the T12-L1 level  was carefully drilled. The thecal sac could then easily be retracted in the posterior lateral aspect of the T12-L1 disc was exposed 15 blade scalpel used to incise the osteotome used to resect a small portion of bone off the superior aspect of the posterior superior vertebral body of L1 in order to ease the entry into the T12-L1 disc space. A pituitary rongeur was then able to be introduced in the disc space debrided it of degenerative disc material. 7 mm dilator was used to dialate  the T12-L1 disc space on the right side attempts were made to dilate further in increments to 8 mm successfully and using small curettes and the disc space was debrided a minimal degenerative disc present in the endplates debrided to bleeding endplate bone.Trial cage placement within the disc place could only allow for an 7 mm lordotic bullet proTi cage. A 7 mm bullet parallel proTi cage was carefully packed with Vivigen and local bone graft that been harvested from previous laminotomies additional bone graft local morselized, cancellous allograft chips and vivigen was then packed into the intervertebral disc space using the 7 mm trial to impacted the graft multiple times. With this then a 7 mm x 27 mm ProTi concord cage was introduced into the disc space on the right side in the correct degree convergence and then impacted then subset beneath the posterior aspect of the disc space by about 3 or 4 mm.  Bleeding controlled using bipolar electrocautery thrombin soaked gel cottonoids.   Bleeding was hemostasis attention was turned to the placement of the rods and Rod sleeves. The exposed retained hardware bilateral L3 to L5 pedicle screw fasteners and rods. The old crosslink at the L2-3 level was removed using the insertion crosslink screw driver. The rod interconnecting sleeve connected to the old retained right L3-L5 rod at the right L3-4 level. 3 pedicle screws on the left L1,L2and 4 and the right two Pedicle screws L1 and L2 and  the connector between the rods on the right  L1-L2 and L3-L5, then carefully exposed and aligned to allow for placement of rods. The left L4 screw was found to be loosened so that the left L4 pedicle screw was removed and the soft tissue overlying the left L4 pedicle screw opening was debrided. The left L4 pedicle was probed and the C-Arm used to verify the left L4 pedicle and a new longer opening for a replacement screw. An 8.0 mm x 16m viper corticocancellous pedicle selected and this was placed using a stab incision into the left lumbosacral fascia and paralumbar muscles the pedicle screw was placed and convergence maintained. The C-arm verified the revised left L4  Pedicle screw in good position and alignment.  A [id number] was cut then contoured using a template on the left side and  placed into the pedicle screws on the left extending from L4 pedicle screw fastener  into each of the left L2, L1 pedicle fasteners extending into the fasteners at T12, T11 and T10 and each of the caps carefully placed loosely tightened. Attention turned to the right side were similarly screws were carefully adjusted to allow for a better pattern screws to allow for placement of fixation rod template for the rod was then taken and a cut quarter inch titanium rod was placed. The rod placed into the connector sleeves at the right L3-4 level and reduced into the right L1,L2, T12, T11 and T10 pedicle fasteners. The fastener caps were then placed. Both rod connector sleeve screws were tightened to 80 foot lbs. The right L1 rod fastener cap was then tightened 85 pounds similarly on at the right side disc space at L1-L3 screw fasteners were compressed  to compress the T12-L1 disc and the respective TLIF.  Posteriorly and the L1 fastener cap was torqued to 85 foot lbs, the right side between L1 and T12 then compressed by loosening the cap at the T12 level, using the compressor and tightening the right T12 cap to 85 foot lbs.  Similarly  this was done on the left side at L1-2, L2-3 and T12-L1 sleeve screws were compressed and tightened 85 pounds.The areas between T10-T11 and T11-T12 were secured by tightening with the torque driver without distraction or compression. A single transverse loading rod was placed at the L2-3 level. Measuring and placing an A-2 transverse loading rod and tightening the attaching nuts to 80 foot lbs.  Irrigation was carried out with copious amounts of saline solution this was done throughout the case. Cell Saver was used during the case. Total cell saver returned blood was 860 cc. Permanent C-arm images were obtained in AP and lateral planes. Remaining local bone graft was then applied along the left from T10  through L4 and right lateral posterior region extending from T10  through L1 posterior facets following decortication of the facets and along the left T10-L3 interlaminar area posteriorly. Gelfoam was then removed. The entire incision then layered with vancomycin powder 1 gram. The lumbodorsal musculature carefully exam debrided of any devitalized tissue following removal of Vicryl retractors were the bleeders were controlled using electrocautery and the area dorsal lumbar muscle were then approximated in the midline with interrupted #1 Vicryl sutures loose the dorsal fascia was reattached to the spinous process of T10, T11, T12, L1 and L2 to superiorly and  inferiorly this was done with #1 Vicryl sutures. The para lumbar muscle approximated over the lower incision site with #1 vicryl.  The subcutaneous layers then approximated over the drain using interrupted 0 Vicryl sutures and 2-0  Vicryl sutures. Skin was closed with stainless steel staples then MedPlex bandage. All instrument and sponge counts were correct. The patient was then returned to a supine position on her bed reactivated extubated and returned to the recovery room in satisfactory condition.     Benjiman Core, PA-C perform the duties of assistant  surgeon during this case. He was present from the beginning of the case to the end of the case assisting in transfer the patient from his stretcher to the OR table and back to the stretcher at the end of the case. Assisted in careful retraction and suction of the laminectomy site delicate neural structures operating under the operating room microscope. He performed closure of the incision from the fascia to the skin applying the dressing.    Basil Dess   09/30/2017,Darrion Macaulay Louanne Skye

## 2017-09-30 NOTE — Anesthesia Procedure Notes (Signed)
Procedure Name: Intubation Date/Time: 09/30/2017 7:46 AM Performed by: Carney Living, CRNA Pre-anesthesia Checklist: Patient identified, Suction available, Emergency Drugs available, Patient being monitored and Timeout performed Patient Re-evaluated:Patient Re-evaluated prior to induction Oxygen Delivery Method: Circle system utilized Preoxygenation: Pre-oxygenation with 100% oxygen Induction Type: IV induction Ventilation: Mask ventilation without difficulty Laryngoscope Size: Mac and 4 Grade View: Grade I Tube type: Oral Tube size: 7.0 mm Number of attempts: 1 Airway Equipment and Method: Stylet Placement Confirmation: ETT inserted through vocal cords under direct vision,  positive ETCO2 and breath sounds checked- equal and bilateral Secured at: 22 cm Tube secured with: Tape Dental Injury: Teeth and Oropharynx as per pre-operative assessment

## 2017-09-30 NOTE — Transfer of Care (Signed)
Immediate Anesthesia Transfer of Care Note  Patient: Denise PughPallis L Flinchum  Procedure(s) Performed: TRANSFORAMINAL LUMBAR INTERBODY FUSION RIGHT THORACIC TWELVE-LUMBAR ONE, POSTERIOR FUSION THORACIC TEN-ELEVEN, THORACIC ELEVEN-TWELVE AND THORACIC TWELVE-LUMBAR ONE, DEPUY MPACT SCREWS, RODS, LOCAL BONE GRAFT, ALLOGRAFT BONE GRAFT, VIVIGEN II (N/A Back)  Patient Location: PACU  Anesthesia Type:General  Level of Consciousness: drowsy and patient cooperative  Airway & Oxygen Therapy: Patient Spontanous Breathing and Patient connected to nasal cannula oxygen  Post-op Assessment: Report given to RN, Post -op Vital signs reviewed and stable and Patient moving all extremities X 4  Post vital signs: Reviewed and stable  Last Vitals:  Vitals Value Taken Time  BP 151/100 09/30/2017  3:21 PM  Temp    Pulse 75 09/30/2017  3:25 PM  Resp 9 09/30/2017  3:25 PM  SpO2 100 % 09/30/2017  3:25 PM  Vitals shown include unvalidated device data.  Last Pain:  Vitals:   09/30/17 0627  TempSrc:   PainSc: 7       Patients Stated Pain Goal: 3 (09/30/17 [id number])  Complications: No apparent anesthesia complications

## 2017-09-30 NOTE — Anesthesia Postprocedure Evaluation (Signed)
Anesthesia Post Note  Patient: [name]  Procedure(s) Performed: TRANSFORAMINAL LUMBAR INTERBODY FUSION RIGHT THORACIC TWELVE-LUMBAR ONE, POSTERIOR FUSION THORACIC TEN-ELEVEN, THORACIC ELEVEN-TWELVE AND THORACIC TWELVE-LUMBAR ONE, DEPUY MPACT SCREWS, RODS, LOCAL BONE GRAFT, ALLOGRAFT BONE GRAFT, VIVIGEN II (N/A Back)     Patient location during evaluation: PACU Anesthesia Type: General Level of consciousness: awake and alert Pain management: pain level controlled Vital Signs Assessment: post-procedure vital signs reviewed and stable Respiratory status: spontaneous breathing, nonlabored ventilation, respiratory function stable and patient connected to nasal cannula oxygen Cardiovascular status: blood pressure returned to baseline and stable Postop Assessment: no apparent nausea or vomiting Anesthetic complications: no    Last Vitals:  Vitals:   09/30/17 1806 09/30/17 2004  BP: (!) 145/90 (!) 153/93  Pulse: (!) 57 (!) 59  Resp: 15 18  Temp: 36.5 C 37 C  SpO2: 99% 99%    Last Pain:  Vitals:   09/30/17 2004  TempSrc: Oral  PainSc: 0-No pain                 Cecile HearingStephen Edward Minetta Krisher

## 2017-09-30 NOTE — Interval H&P Note (Signed)
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 After consideration of risks, benefits and other options for treatment, the patient has consented to  Procedure(s): TRANSFORAMINAL LUMBAR INTERBODY FUSION RIGHT T12-L1, POSTERIOR FUSION T10-11, T11-12 AND T12-L1, DEPUY MPACT SCREWS, RODS, POSSIBLE HOOKS AND SUBLAMINAR WIRE, LOCAL BONE GRAFT, ALLOGRAFT BONE GRAFT, VIVIGEN II (N/A) as a surgical intervention .  The patient's history has been reviewed, patient examined, no change in status, stable for surgery.  I have reviewed the patient's chart and labs.  Questions were answered to the patient's satisfaction.     Vira BrownsJames Gari Hartsell

## 2017-09-30 NOTE — Progress Notes (Signed)
Pharmacy Antibiotic Note  [name] [name] is a 72 y.o. female admitted on 09/30/2017 with surgical prophylaxis.  Pharmacy has been consulted for vancomycin dosing.  Patient does not have drain in place.  Plan: Vancomycin 1250 mg x 1 tonight. Pharmacy will sign off, please contact if questions.  Height: 5\' 4"  (162.6 cm) Weight: 245 lb (111.1 kg) IBW/kg (Calculated) : 54.7  Temp (24hrs), Avg:97.9 F (36.6 C), Min:97.8 F (36.6 C), Max:98 F (36.7 C)  Recent Labs  Lab 09/29/17 1049  WBC 4.0  CREATININE 1.09*    Estimated Creatinine Clearance: 58.6 mL/min (A) (by C-G formula based on SCr of 1.09 mg/dL (H)).    Allergies  Allergen Reactions  . Lisinopril Swelling    Swelling in mouth and throat  . Penicillins Rash    Has patient had a PCN reaction causing immediate rash, facial/tongue/throat swelling, SOB or lightheadedness with hypotension: No Has patient had a PCN reaction causing severe rash involving mucus membranes or skin necrosis: No Has patient had a PCN reaction that required hospitalization No Has patient had a PCN reaction occurring within the last 10 years: No If all of the above answers are "NO", then may proceed with Cephalosporin use.   . Tramadol Hcl Rash  . Tramadol Hcl Rash    Thank you for allowing pharmacy to be a part of this patient's care.  Reece LeaderJessica Ryott Rafferty, Colon FlatteryPharm D, BCPS, [facility] Partnership[facility]CCP Clinical Pharmacist Pager [phone]  09/30/2017 5:30 PM

## 2017-09-30 NOTE — Brief Op Note (Signed)
09/30/2017  2:43 PM  PATIENT:  Denise PughPallis L Forbes  72 y.o. 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  POST-OPERATIVE DIAGNOSIS:  adjacent T12-L1 disc herniation above L1-S1  PROCEDURE:  Procedure(s): TRANSFORAMINAL LUMBAR INTERBODY FUSION RIGHT THORACIC TWELVE-LUMBAR ONE, POSTERIOR FUSION THORACIC TEN-ELEVEN, THORACIC ELEVEN-TWELVE AND THORACIC TWELVE-LUMBAR ONE, DEPUY MPACT SCREWS, RODS, LOCAL BONE GRAFT, ALLOGRAFT BONE GRAFT, VIVIGEN II (N/A)  SURGEON:  Surgeon(s) and Role:    * Kerrin ChampagneNitka, [name] E, MD - Primary  PHYSICIAN ASSISTANT: Zonia KiefJames Owens, PA-C  ANESTHESIA:   local and general  EBL:  500 mL   BLOOD ADMINISTERED:150 CC CELLSAVER  DRAINS: Urinary Catheter (Foley)   LOCAL MEDICATIONS USED:  MARCAINE 0.5% 1:1 EXPAREL 1.3% Amount: 30 ml  SPECIMEN:  No Specimen  DISPOSITION OF SPECIMEN:  N/A  COUNTS:  YES  TOURNIQUET:  * No tourniquets in log *  DICTATION: .Dragon Dictation  PLAN OF CARE: Admit to inpatient   PATIENT DISPOSITION:  PACU - hemodynamically stable.   Delay start of Pharmacological VTE agent (>24hrs) due to surgical blood loss or risk of bleeding: yes

## 2017-10-01 ENCOUNTER — Telehealth (INDEPENDENT_AMBULATORY_CARE_PROVIDER_SITE_OTHER): Payer: Self-pay | Admitting: Specialist

## 2017-10-01 LAB — CBC
HCT: 33.1 % — ABNORMAL LOW (ref 36.0–46.0)
HEMOGLOBIN: 10.5 g/dL — AB (ref 12.0–15.0)
MCH: 24.9 pg — ABNORMAL LOW (ref 26.0–34.0)
MCHC: 31.7 g/dL (ref 30.0–36.0)
MCV: 78.6 fL (ref 78.0–100.0)
Platelets: 171 10*3/uL (ref 150–400)
RBC: 4.21 MIL/uL (ref 3.87–5.11)
RDW: 16.4 % — AB (ref 11.5–15.5)
WBC: 9.2 10*3/uL (ref 4.0–10.5)

## 2017-10-01 LAB — BASIC METABOLIC PANEL
Anion gap: 8 (ref 5–15)
BUN: 20 mg/dL (ref 6–20)
CALCIUM: 8 mg/dL — AB (ref 8.9–10.3)
CHLORIDE: 107 mmol/L (ref 101–111)
CO2: 24 mmol/L (ref 22–32)
CREATININE: 1.21 mg/dL — AB (ref 0.44–1.00)
GFR calc Af Amer: 51 mL/min — ABNORMAL LOW (ref >60)
GFR calc non Af Amer: 44 mL/min — ABNORMAL LOW (ref >60)
Glucose, Bld: 114 mg/dL — ABNORMAL HIGH (ref 65–99)
Potassium: 3.8 mmol/L (ref 3.5–5.1)
SODIUM: 139 mmol/L (ref 135–145)

## 2017-10-01 NOTE — Progress Notes (Signed)
     Subjective: 1 Day Post-Op Procedure(s) (LRB): TRANSFORAMINAL LUMBAR INTERBODY FUSION RIGHT THORACIC TWELVE-LUMBAR ONE, POSTERIOR FUSION THORACIC TEN-ELEVEN, THORACIC ELEVEN-TWELVE AND THORACIC TWELVE-LUMBAR ONE, DEPUY MPACT SCREWS, RODS, LOCAL BONE GRAFT, ALLOGRAFT BONE GRAFT, VIVIGEN II (N/A) Awake,alert and oriented x 4. Foley discontinued, dressing intact. Some minimal discomfort right dorsal foot, no numbness or paresthesias. Some bloating of abdomen.  Tolerating po liquids and is using the incentive spirometer.   Patient reports pain as moderate.    Objective:   VITALS:  Temp:  [97.7 F (36.5 C)-98.7 F (37.1 C)] 98.1 F (36.7 C) (06/05 0757) Pulse Rate:  [56-78] 65 (06/05 0757) Resp:  [12-18] 16 (06/05 0757) BP: (107-153)/(61-100) 107/61 (06/05 0757) SpO2:  [98 %-100 %] 100 % (06/05 0757)  Neurologically intact ABD soft Neurovascular intact Sensation intact distally Intact pulses distally Dorsiflexion/Plantar flexion intact Incision: dressing C/D/I and scant drainage   LABS Recent Labs    09/29/17 1049  HGB 13.0  WBC 4.0  PLT 239   Recent Labs    09/29/17 1049 10/01/17 0726  NA 140 139  K 4.1 3.8  CL 102 107  CO2 28 24  BUN 11 20  CREATININE 1.09* 1.21*  GLUCOSE 100* 114*   Recent Labs    09/29/17 1049  INR 1.07     Assessment/Plan: 1 Day Post-Op Procedure(s) (LRB): TRANSFORAMINAL LUMBAR INTERBODY FUSION RIGHT THORACIC TWELVE-LUMBAR ONE, POSTERIOR FUSION THORACIC TEN-ELEVEN, THORACIC ELEVEN-TWELVE AND THORACIC TWELVE-LUMBAR ONE, DEPUY MPACT SCREWS, RODS, LOCAL BONE GRAFT, ALLOGRAFT BONE GRAFT, VIVIGEN II (N/A)  Advance diet Up with therapy  Continue IVF today as her creatinine is elevated and suggest contracted fluid volume.  Awaiting results of CBC.   Vira BrownsJames Nitka 10/01/2017, 8:50 AMPatient ID: [name], female   DOB: 12-01-1945, 72 y.o.   MRN: [id number]

## 2017-10-01 NOTE — Plan of Care (Signed)
  Problem: Education: Goal: Knowledge of General Education information will improve Outcome: Progressing   Problem: Health Behavior/Discharge Planning: Goal: Ability to manage health-related needs will improve Outcome: Progressing   Problem: Clinical Measurements: Goal: Ability to maintain clinical measurements within normal limits will improve Outcome: Progressing Goal: Will remain free from infection Outcome: Progressing Goal: Diagnostic test results will improve Outcome: Progressing Goal: Respiratory complications will improve Outcome: Progressing Goal: Cardiovascular complication will be avoided Outcome: Progressing   Problem: Activity: Goal: Risk for activity intolerance will decrease Outcome: Progressing   Problem: Nutrition: Goal: Adequate nutrition will be maintained Outcome: Progressing   Problem: Coping: Goal: Level of anxiety will decrease Outcome: Progressing   Problem: Elimination: Goal: Will not experience complications related to bowel motility Outcome: Progressing Goal: Will not experience complications related to urinary retention Outcome: Progressing   Problem: Pain Managment: Goal: General experience of comfort will improve Outcome: Progressing   Problem: Safety: Goal: Ability to remain free from injury will improve Outcome: Progressing   Problem: Skin Integrity: Goal: Risk for impaired skin integrity will decrease Outcome: Progressing   Problem: Spiritual Needs Goal: Ability to function at adequate level Outcome: Progressing   Problem: Activity: Goal: Ability to tolerate increased activity will improve Outcome: Progressing   Problem: Bowel/Gastric: Goal: Gastrointestinal status for postoperative course will improve Outcome: Progressing   Problem: Education: Goal: Ability to verbalize activity precautions or restrictions will improve Outcome: Progressing Goal: Knowledge of the prescribed therapeutic regimen will improve Outcome:  Progressing   Problem: Physical Regulation: Goal: Postoperative complications will be avoided or minimized Outcome: Progressing   Problem: Pain Management: Goal: Pain level will decrease Outcome: Progressing   Problem: Skin Integrity: Goal: Signs of wound healing will improve Outcome: Progressing   Problem: Health Behavior/Discharge Planning: Goal: Identification of resources available to assist in meeting health care needs will improve Outcome: Progressing   Problem: Bladder/Genitourinary: Goal: Urinary functional status for postoperative course will improve Outcome: Progressing

## 2017-10-01 NOTE — Progress Notes (Signed)
   10/01/17 1100  Clinical Encounter Type  Visited With Patient  Visit Type Initial  Referral From Patient;Nurse  Consult/Referral To Chaplain  Spiritual Encounters  Spiritual Needs Prayer  Stress Factors  Patient Stress Factors None identified   Following up on a SCC for prayer.  Patient sitting up on the side of the bed and welcomed the visit.  She stated she has already been up and walking and ready to recovery from the surgery.  She has a positive outlook on life and has a good support network with her family even though they don't all live here.  Shared about her faith life.  We prayed together.  Will follow and support as needed. Chaplain Agustin CreeNewton Oluchi Pucci

## 2017-10-01 NOTE — Progress Notes (Signed)
Physical Therapy Evaluation Patient Details Name: [name] L Cronin MRN: [id number] DOB: 06-Jan-1946 Today's Date: 10/01/2017   History of Present Illness  72 yo female s/p TRANSFORAMINAL LUMBAR INTERBODY FUSION RIGHT THORACIC TWELVE-LUMBAR ONE, POSTERIOR FUSION THORACIC TEN-ELEVEN, THORACIC ELEVEN-TWELVE AND THORACIC TWELVE-LUMBAR ONE, DEPUY MPACT SCREWS, RODS, LOCAL BONE GRAFT, ALLOGRAFT BONE GRAFT, VIVIGEN II   Clinical Impression  [name] in a very pleasant 72 y/o female admitted with the above listed diagnosis. Patient reports that prior to admission, did require some help with daily activities. Today patient requiring Min A for bed mobility and transfers with [facility] for ambulation with RW. Reviewed back precautions with patient with good verbal understanding. PT to continue to follow patient acutely to maximize independent functional mobility prior to discharge home.     Follow Up Recommendations Home health PT;Follow surgeon's recommendation for DC plan and follow-up therapies    Equipment Recommendations  Rolling walker with 5" wheels    Recommendations for Other Services       Precautions / Restrictions Precautions Precautions: Back Precaution Comments: reviewed handout given by OT Required Braces or Orthoses: Spinal Brace Spinal Brace: Applied in sitting position;Thoracolumbosacral orthotic Restrictions Weight Bearing Restrictions: No      Mobility  Bed Mobility Overal bed mobility: Needs Assistance Bed Mobility: Rolling;Sidelying to Sit;Sit to Supine Rolling: Min assist Sidelying to sit: Min assist(heavy use of handrail) Supine to sit: Mod assist Sit to supine: Min assist(for LE management)   General bed mobility comments: Patient with good recognition of log rolling technique with minimal verbal cueing to maintain alignment. Min A to pull up on PT with use of handrails as well. Min A for LE management  Transfers Overall transfer level: Needs  assistance Equipment used: Rolling walker (2 wheeled) Transfers: Sit to/from Stand Sit to Stand: Min guard         General transfer comment: verbal cues to push from bed rather than pull up on RW. Able to sit <> stand from toilet with use of grab bar  Ambulation/Gait Ambulation/Gait assistance: Min guard Ambulation Distance (Feet): 80 Feet Assistive device: Rolling walker (2 wheeled) Gait Pattern/deviations: Step-through pattern;Decreased stride length Gait velocity: decreased   General Gait Details: awareness of posture with gait - verbalizes that she needs to work on upright posture  Stairs            Wheelchair Mobility    Modified Rankin (Stroke Patients Only)       Balance Overall balance assessment: Needs assistance Sitting-balance support: Feet supported;No upper extremity supported Sitting balance-Leahy Scale: Fair     Standing balance support: Bilateral upper extremity supported;During functional activity Standing balance-Leahy Scale: Fair Standing balance comment: reliant on RW                             Pertinent Vitals/Pain Pain Assessment: 0-10 Pain Score: 4  Pain Location: back Pain Descriptors / Indicators: Grimacing;Sore Pain Intervention(s): Limited activity within patient's tolerance;Monitored during session;Repositioned    Home Living Family/patient expects to be discharged to:: Private residence Living Arrangements: Children;Other relatives;Non-relatives/Friends Available Help at Discharge: Family;Friend(s) Type of Home: Apartment Home Access: Stairs to enter Entrance Stairs-Rails: None Entrance Stairs-Number of Steps: 1 Home Layout: One level Home Equipment: Grab bars - tub/shower;Cane - single point      Prior Function Level of Independence: Needs assistance      ADL's / Homemaking Assistance Needed: family assist with dressing  Hand Dominance   Dominant Hand: Right    Extremity/Trunk Assessment    Upper Extremity Assessment Upper Extremity Assessment: Defer to OT evaluation    Lower Extremity Assessment Lower Extremity Assessment: Overall WFL for tasks assessed    Cervical / Trunk Assessment Cervical / Trunk Assessment: Other exceptions(s/p surgical intervention)  Communication   Communication: No difficulties  Cognition Arousal/Alertness: Awake/alert Behavior During Therapy: WFL for tasks assessed/performed Overall Cognitive Status: Within Functional Limits for tasks assessed                                        General Comments General comments (skin integrity, edema, etc.): dressing dry and intact    Exercises     Assessment/Plan    PT Assessment Patient needs continued PT services  PT Problem List Decreased strength;Decreased activity tolerance;Decreased balance;Decreased mobility;Decreased knowledge of use of DME;Decreased safety awareness;Pain       PT Treatment Interventions DME instruction;Gait training;Stair training;Functional mobility training;Therapeutic activities;Therapeutic exercise;Balance training;Patient/family education    PT Goals (Current goals can be found in the Care Plan section)  Acute Rehab PT Goals Patient Stated Goal: to go home on saturday  PT Goal Formulation: With patient Time For Goal Achievement: 10/15/17 Potential to Achieve Goals: Good    Frequency Min 5X/week   Barriers to discharge        Co-evaluation               AM-PAC PT "6 Clicks" Daily Activity  Outcome Measure Difficulty turning over in bed (including adjusting bedclothes, sheets and blankets)?: Unable Difficulty moving from lying on back to sitting on the side of the bed? : Unable Difficulty sitting down on and standing up from a chair with arms (e.g., wheelchair, bedside commode, etc,.)?: Unable Help needed moving to and from a bed to chair (including a wheelchair)?: A Little Help needed walking in hospital room?: A Little Help needed  climbing 3-5 steps with a railing? : A Lot 6 Click Score: 11    End of Session Equipment Utilized During Treatment: Gait belt Activity Tolerance: Patient tolerated treatment well Patient left: in bed;with call bell/phone within reach;with SCD's reapplied Nurse Communication: Mobility status PT Visit Diagnosis: Unsteadiness on feet (R26.81);Other abnormalities of gait and mobility (R26.89);Muscle weakness (generalized) (M62.81);Pain Pain - part of body: (low back)    Time: 9604-5409 PT Time Calculation (min) (ACUTE ONLY): 21 min   Charges:   PT Evaluation $PT Eval Moderate Complexity: 1 Mod     PT G Codes:        Kipp Laurence, PT, DPT 10/01/17 11:20 AM

## 2017-10-01 NOTE — Telephone Encounter (Signed)
Ross with [employer] is requesting a call from Dr. Nitka after lunch today at # [phone].   It is concerning this patient, he had to put her in a different style brace due to her height/weight and he just wanted to discuss this with him.  Ross CB # [phone].  

## 2017-10-01 NOTE — Telephone Encounter (Signed)
[facility] with [employer] is requesting a call from Dr. Otelia SergeantNitka after lunch today at # [phone].   It is concerning this patient, he had to put her in a different style brace due to her height/weight and he just wanted to discuss this with him.  Ross CB # [phone].

## 2017-10-01 NOTE — Evaluation (Signed)
Occupational Therapy Evaluation Patient Details Name: [name] L Donahoo MRN: [id number] DOB: 10/23/1945 Today's Date: 10/01/2017    History of Present Illness 72 yo female s/p TRANSFORAMINAL LUMBAR INTERBODY FUSION RIGHT THORACIC TWELVE-LUMBAR ONE, POSTERIOR FUSION THORACIC TEN-ELEVEN, THORACIC ELEVEN-TWELVE AND THORACIC TWELVE-LUMBAR ONE, DEPUY MPACT SCREWS, RODS, LOCAL BONE GRAFT, ALLOGRAFT BONE GRAFT, VIVIGEN II    Clinical Impression   Patient is s/p TLIF T12- L1 T10-11 T11-12  surgery resulting in functional limitations due to the deficits listed below (see OT problem list). Pt currently requires mod (A) for LB adls and will have family PRN in the home. Pt plans to stay acutely until Saturday because pt states "I ain't going no where but home".  Patient will benefit from skilled OT acutely to increase independence and safety with ADLS to allow discharge HHOT/ home aide/ RW/ BSC/ Shower seat.     Follow Up Recommendations       Equipment Recommendations  3 in 1 bedside commode;Tub/shower seat;Other (comment)(reports Dr Havery MorosNikta said he get me a walker)    Recommendations for Other Services Other (comment)(wants a home aide)     Precautions / Restrictions Precautions Precautions: Back Precaution Comments: back handout provided and reviewed for adls Required Braces or Orthoses: Spinal Brace Spinal Brace: Applied in sitting position;Thoracolumbosacral orthotic Restrictions Weight Bearing Restrictions: No      Mobility Bed Mobility Overal bed mobility: Needs Assistance Bed Mobility: Rolling;Supine to Sit;Sit to Supine Rolling: Min assist   Supine to sit: Mod assist Sit to supine: Mod assist   General bed mobility comments: pt requires education on keeping body aligned and pushign up into sitting. pt requires (A) for LB off eob and onto EOB. pt able to scoot back into the bed for side lying at the end of session  Transfers Overall transfer level: Needs assistance Equipment  used: Rolling walker (2 wheeled) Transfers: Sit to/from Stand Sit to Stand: Min guard         General transfer comment: pt requires cues for hand placement and able to power up without (A)    Balance                                           ADL either performed or assessed with clinical judgement   ADL Overall ADL's : Needs assistance/impaired Eating/Feeding: Independent   Grooming: Wash/dry hands;Wash/dry face;Supervision/safety;Standing   Upper Body Bathing: Minimal assistance;Sitting   Lower Body Bathing: Moderate assistance;Sit to/from stand Lower Body Bathing Details (indicate cue type and reason): will requires AE for LB         Toilet Transfer: Min Emergency planning/management officerguard;BSC Toilet Transfer Details (indicate cue type and reason): requesting 3n1 over toilet in room for patient from RN staff         Functional mobility during ADLs: Min guard;Rolling walker General ADL Comments: Pt educated on back precautions with adls. pt reports she plans to stay acutely until at least saturday because she is not going to community based rehab.    Back handout provided and reviewed adls in detail. Pt educated NW:GNFAOon:avoid sitting for long periods of time, correct bed positioning for sleeping, correct sequence for bed mobility, avoiding lifting more than 5 pounds and never wash directly over incision. Pt with pending brace. [facility] and OT will review brace next session.     Vision Baseline Vision/History: Wears glasses Wears Glasses: Reading only  Perception     Praxis      Pertinent Vitals/Pain Pain Assessment: 0-10 Pain Score: 5  Pain Location: back Pain Descriptors / Indicators: Sore Pain Intervention(s): Monitored during session;Premedicated before session;Repositioned     Hand Dominance Right   Extremity/Trunk Assessment Upper Extremity Assessment Upper Extremity Assessment: Overall WFL for tasks assessed   Lower Extremity  Assessment Lower Extremity Assessment: Defer to PT evaluation   Cervical / Trunk Assessment Cervical / Trunk Assessment: Other exceptions(s/p surg)   Communication Communication Communication: No difficulties   Cognition Arousal/Alertness: Awake/alert Behavior During Therapy: WFL for tasks assessed/performed Overall Cognitive Status: Within Functional Limits for tasks assessed                                     General Comments  dressing dry and intact    Exercises     Shoulder Instructions      Home Living Family/patient expects to be discharged to:: Private residence Living Arrangements: Children;Other relatives;Non-relatives/Friends Available Help at Discharge: Family;Friend(s) Type of Home: Apartment Home Access: Stairs to enter Entrance Stairs-Number of Steps: 1 Entrance Stairs-Rails: None Home Layout: One level     Bathroom Shower/Tub: Chief Strategy Officer: Standard     Home Equipment: Grab bars - tub/shower;Cane - single point          Prior Functioning/Environment Level of Independence: Needs assistance    ADL's / Homemaking Assistance Needed: family helps to dress LB pt wears gowns and house coat to avoid LB requirements            OT Problem List: Decreased strength;Decreased activity tolerance;Impaired balance (sitting and/or standing);Decreased safety awareness;Decreased knowledge of use of DME or AE;Decreased knowledge of precautions;Pain;Obesity      OT Treatment/Interventions: Self-care/ADL training;Therapeutic exercise;DME and/or AE instruction;Therapeutic activities;Patient/family education;Balance training    OT Goals(Current goals can be found in the care plan section) Acute Rehab OT Goals Patient Stated Goal: to go home on saturday  OT Goal Formulation: With patient Time For Goal Achievement: 10/15/17 Potential to Achieve Goals: Good  OT Frequency: Min 2X/week   Barriers to D/C:             Co-evaluation              AM-PAC PT "6 Clicks" Daily Activity     Outcome Measure Help from another person eating meals?: None Help from another person taking care of personal grooming?: None Help from another person toileting, which includes using toliet, bedpan, or urinal?: A Little Help from another person bathing (including washing, rinsing, drying)?: A Little Help from another person to put on and taking off regular upper body clothing?: A Little Help from another person to put on and taking off regular lower body clothing?: A Lot 6 Click Score: 19   End of Session Equipment Utilized During Treatment: Gait belt;Rolling walker Nurse Communication: Mobility status;Precautions  Activity Tolerance: Patient tolerated treatment well Patient left: in bed;with call bell/phone within reach  OT Visit Diagnosis: Unsteadiness on feet (R26.81)                Time: 6962-9528 OT Time Calculation (min): 31 min Charges:  OT General Charges $OT Visit: 1 Visit OT Evaluation $OT Eval Moderate Complexity: 1 Mod G-Codes:      Mateo Flow   OTR/L Pager: [phone] Office: [phone] .   Boone Master B 10/01/2017, 9:56 AM

## 2017-10-01 NOTE — Progress Notes (Signed)
Orthopedic Tech Progress Note Patient Details:  [name] 12/17/1945 [id number]  Patient ID: [name], female   DOB: 12/17/1945, 72 y.o.   MRN: [id number]   Saul FordyceJennifer C Zareya Tuckett 10/01/2017, 9:26 AMCalled [employer] for TLSO brace.

## 2017-10-02 LAB — CBC WITH DIFFERENTIAL/PLATELET
ABS IMMATURE GRANULOCYTES: 0.1 10*3/uL (ref 0.0–0.1)
BASOS ABS: 0 10*3/uL (ref 0.0–0.1)
Basophils Relative: 0 %
EOS PCT: 2 %
Eosinophils Absolute: 0.1 10*3/uL (ref 0.0–0.7)
HEMATOCRIT: 30 % — AB (ref 36.0–46.0)
Hemoglobin: 9.4 g/dL — ABNORMAL LOW (ref 12.0–15.0)
IMMATURE GRANULOCYTES: 1 %
LYMPHS ABS: 1.9 10*3/uL (ref 0.7–4.0)
LYMPHS PCT: 21 %
MCH: 24.8 pg — ABNORMAL LOW (ref 26.0–34.0)
MCHC: 31.3 g/dL (ref 30.0–36.0)
MCV: 79.2 fL (ref 78.0–100.0)
Monocytes Absolute: 0.9 10*3/uL (ref 0.1–1.0)
Monocytes Relative: 10 %
NEUTROS ABS: 5.7 10*3/uL (ref 1.7–7.7)
NEUTROS PCT: 66 %
Platelets: 132 10*3/uL — ABNORMAL LOW (ref 150–400)
RBC: 3.79 MIL/uL — ABNORMAL LOW (ref 3.87–5.11)
RDW: 16.8 % — ABNORMAL HIGH (ref 11.5–15.5)
WBC: 8.7 10*3/uL (ref 4.0–10.5)

## 2017-10-02 LAB — BASIC METABOLIC PANEL
ANION GAP: 6 (ref 5–15)
BUN: 15 mg/dL (ref 6–20)
CHLORIDE: 109 mmol/L (ref 101–111)
CO2: 24 mmol/L (ref 22–32)
Calcium: 8 mg/dL — ABNORMAL LOW (ref 8.9–10.3)
Creatinine, Ser: 1.17 mg/dL — ABNORMAL HIGH (ref 0.44–1.00)
GFR calc Af Amer: 53 mL/min — ABNORMAL LOW (ref >60)
GFR, EST NON AFRICAN AMERICAN: 46 mL/min — AB (ref >60)
Glucose, Bld: 129 mg/dL — ABNORMAL HIGH (ref 65–99)
POTASSIUM: 4 mmol/L (ref 3.5–5.1)
SODIUM: 139 mmol/L (ref 135–145)

## 2017-10-02 NOTE — Progress Notes (Signed)
Physical Therapy Treatment Patient Details Name: [name] MRN: [id number] DOB: 02/07/1946 Today's Date: 10/02/2017    History of Present Illness 72 yo female s/p TRANSFORAMINAL LUMBAR INTERBODY FUSION RIGHT THORACIC TWELVE-LUMBAR ONE, POSTERIOR FUSION THORACIC TEN-ELEVEN, THORACIC ELEVEN-TWELVE AND THORACIC TWELVE-LUMBAR ONE, DEPUY MPACT SCREWS, RODS, LOCAL BONE GRAFT, ALLOGRAFT BONE GRAFT, VIVIGEN II     PT Comments    Patient continues to progress well toward PT goals and able to recall and maintain 3/3 back precautions throughout session. Current plan remains appropriate.    Follow Up Recommendations  Home health PT;Follow surgeon's recommendation for DC plan and follow-up therapies     Equipment Recommendations  Other (comment)(equipment delivered to room)    Recommendations for Other Services       Precautions / Restrictions Precautions Precautions: Back Precaution Comments: pt able to recall 3/3 precautions Required Braces or Orthoses: Spinal Brace Spinal Brace: Applied in sitting position;Thoracolumbosacral orthotic Restrictions Weight Bearing Restrictions: No    Mobility  Bed Mobility Overal bed mobility: Needs Assistance Bed Mobility: Rolling;Sidelying to Sit Rolling: Supervision Sidelying to sit: Supervision       General bed mobility comments: supervision for safety; use of rail and good log roll technique demonstrated without cues  Transfers Overall transfer level: Needs assistance Equipment used: Rolling walker (2 wheeled) Transfers: Sit to/from Stand Sit to Stand: Min guard         General transfer comment: cues for safe hand placement and increased time  Ambulation/Gait Ambulation/Gait assistance: Min guard Ambulation Distance (Feet): 200 Feet Assistive device: Rolling walker (2 wheeled) Gait Pattern/deviations: Step-through pattern;Decreased stride length Gait velocity: decreased   General Gait Details: slow, steady gait; cues for  posture initially and RW adjusted to height    Stairs             Wheelchair Mobility    Modified Rankin (Stroke Patients Only)       Balance Overall balance assessment: Needs assistance Sitting-balance support: Feet supported;No upper extremity supported Sitting balance-Leahy Scale: Fair     Standing balance support: Bilateral upper extremity supported;During functional activity Standing balance-Leahy Scale: Fair                              Cognition Arousal/Alertness: Awake/alert Behavior During Therapy: WFL for tasks assessed/performed Overall Cognitive Status: Within Functional Limits for tasks assessed                                        Exercises      General Comments General comments (skin integrity, edema, etc.): assistance required to don brace      Pertinent Vitals/Pain Pain Assessment: Faces Faces Pain Scale: Hurts little more Pain Location: back Pain Descriptors / Indicators: Sore;Guarding Pain Intervention(s): Limited activity within patient's tolerance;Repositioned    Home Living                      Prior Function            PT Goals (current goals can now be found in the care plan section) Acute Rehab PT Goals PT Goal Formulation: With patient Time For Goal Achievement: 10/15/17 Potential to Achieve Goals: Good Progress towards PT goals: Progressing toward goals    Frequency    Min 5X/week      PT Plan Current plan remains appropriate  Co-evaluation              AM-PAC PT "6 Clicks" Daily Activity  Outcome Measure  Difficulty turning over in bed (including adjusting bedclothes, sheets and blankets)?: Unable Difficulty moving from lying on back to sitting on the side of the bed? : Unable Difficulty sitting down on and standing up from a chair with arms (e.g., wheelchair, bedside commode, etc,.)?: Unable Help needed moving to and from a bed to chair (including a wheelchair)?:  A Little Help needed walking in hospital room?: A Little Help needed climbing 3-5 steps with a railing? : A Little 6 Click Score: 12    End of Session Equipment Utilized During Treatment: Gait belt Activity Tolerance: Patient tolerated treatment well Patient left: with call bell/phone within reach;in chair Nurse Communication: Mobility status PT Visit Diagnosis: Unsteadiness on feet (R26.81);Other abnormalities of gait and mobility (R26.89);Muscle weakness (generalized) (M62.81);Pain Pain - part of body: (low back)     Time: 6045-4098 PT Time Calculation (min) (ACUTE ONLY): 24 min  Charges:  $Gait Training: 8-22 mins $Therapeutic Activity: 8-22 mins                    G Codes:       Erline Levine, PTA Pager: [phone]     Carolynne Edouard 10/02/2017, 4:10 PM

## 2017-10-02 NOTE — Progress Notes (Signed)
Occupational Therapy Treatment Patient Details Name: [name] MRN: [id number] DOB: Mar 30, 1946 Today's Date: 10/02/2017    History of present illness 72 yo female s/p TRANSFORAMINAL LUMBAR INTERBODY FUSION RIGHT THORACIC TWELVE-LUMBAR ONE, POSTERIOR FUSION THORACIC TEN-ELEVEN, THORACIC ELEVEN-TWELVE AND THORACIC TWELVE-LUMBAR ONE, DEPUY MPACT SCREWS, RODS, LOCAL BONE GRAFT, ALLOGRAFT BONE GRAFT, VIVIGEN II    OT comments  Pt progressing towards OT goals this session. Pt continues to require assist to don brace, and for LB ADL. Educated in AE for LB this session as Pt will be alone. Pt will benefit from additional session of OT tomorrow to complete tub transfer and reinforce AE education.    Follow Up Recommendations  Home health OT    Equipment Recommendations  3 in 1 bedside commode;Tub/shower seat;Other (comment)    Recommendations for Other Services (Pt wants an aide)    Precautions / Restrictions Precautions Precautions: Back Precaution Comments: pt able to recall 3/3 precautions Required Braces or Orthoses: Spinal Brace Spinal Brace: Applied in sitting position;Thoracolumbosacral orthotic Restrictions Weight Bearing Restrictions: No       Mobility Bed Mobility Overal bed mobility: Needs Assistance Bed Mobility: Rolling;Sidelying to Sit Rolling: Supervision Sidelying to sit: Supervision       General bed mobility comments: Pt OOB in recliner when OT arrived  Transfers Overall transfer level: Needs assistance Equipment used: Rolling walker (2 wheeled) Transfers: Sit to/from Stand Sit to Stand: Min guard         General transfer comment: cues for safe hand placement and increased time    Balance Overall balance assessment: Needs assistance Sitting-balance support: Feet supported;No upper extremity supported Sitting balance-Leahy Scale: Fair     Standing balance support: Bilateral upper extremity supported;During functional activity Standing  balance-Leahy Scale: Fair                             ADL either performed or assessed with clinical judgement   ADL Overall ADL's : Needs assistance/impaired     Grooming: Wash/dry hands;Supervision/safety;Standing Grooming Details (indicate cue type and reason): sink level         Upper Body Dressing : Minimal assistance;Cueing for sequencing;Sitting Upper Body Dressing Details (indicate cue type and reason): to don brace Lower Body Dressing: Minimal assistance;Sit to/from stand Lower Body Dressing Details (indicate cue type and reason): Eductaed in grabber/reacher, sock donner, shoe horn, for LB dressing Toilet Transfer: Min guard;Ambulation;RW           Functional mobility during ADLs: Min guard;Rolling walker       Vision       Perception     Praxis      Cognition Arousal/Alertness: Awake/alert Behavior During Therapy: WFL for tasks assessed/performed Overall Cognitive Status: Within Functional Limits for tasks assessed                                          Exercises     Shoulder Instructions       General Comments assistance required to don brace    Pertinent Vitals/ Pain       Pain Assessment: 0-10 Pain Score: 6  Faces Pain Scale: Hurts little more Pain Location: back Pain Descriptors / Indicators: Sore;Guarding Pain Intervention(s): Limited activity within patient's tolerance;Monitored during session;Repositioned  Home Living  Prior Functioning/Environment              Frequency  Min 2X/week        Progress Toward Goals  OT Goals(current goals can now be found in the care plan section)  Progress towards OT goals: Progressing toward goals  Acute Rehab OT Goals Patient Stated Goal: to go home on saturday  OT Goal Formulation: With patient Time For Goal Achievement: 10/15/17 Potential to Achieve Goals: Good  Plan Discharge plan remains  appropriate;Frequency remains appropriate    Co-evaluation                 AM-PAC PT "6 Clicks" Daily Activity     Outcome Measure   Help from another person eating meals?: None Help from another person taking care of personal grooming?: None Help from another person toileting, which includes using toliet, bedpan, or urinal?: A Little Help from another person bathing (including washing, rinsing, drying)?: A Little Help from another person to put on and taking off regular upper body clothing?: A Little Help from another person to put on and taking off regular lower body clothing?: A Lot 6 Click Score: 19    End of Session Equipment Utilized During Treatment: Gait belt;Rolling walker  OT Visit Diagnosis: Unsteadiness on feet (R26.81)   Activity Tolerance Patient tolerated treatment well   Patient Left in chair;with call bell/phone within reach   Nurse Communication Mobility status;Precautions        Time: [id number] OT Time Calculation (min): 15 min  Charges: OT General Charges $OT Visit: 1 Visit OT Treatments $Self Care/Home Management : 8-22 mins  Sherryl MangesLaura Sinclair [place] OTR/L [phone]   [name] 10/02/2017, 5:54 PM

## 2017-10-02 NOTE — Care Management Note (Signed)
Case Management Note  Patient Details  Name: [name] L Sutley MRN: [id number] Date of Birth: 04-Jun-1945  Subjective/Objective:                    Action/Plan: Plan is for patient [facility]. CM provided choice and she selected [facility]. [facility].  Pt with orders for walker and 3 in 1. [name] notified and will deliver the equipment to the room.  Pt states she has transportation home. She has a son, family and neighbors that can assist at home.    Expected Discharge Date:                  Expected Discharge Plan:  Home w Home Health Services  In-House Referral:     Discharge planning Services  CM Consult  Post Acute Care Choice:  Durable Medical Equipment, Home Health Choice offered to:  Patient  DME Arranged:  3-N-1, Walker rolling DME Agency:  [facility]  HH Arranged:  PT [facility]  Status of Service:  Completed, signed off  If discussed at [employer] Length of Stay Meetings, dates discussed:    Additional Comments:  Kermit BaloKelli F Angelique Chevalier, RN 10/02/2017, 12:30 PM

## 2017-10-02 NOTE — Progress Notes (Signed)
Lab tests today show Hgb 9.2, Hct 30%, creatinine is improved 1.17.

## 2017-10-02 NOTE — Progress Notes (Signed)
     Subjective: 2 Days Post-Op Procedure(s) (LRB): TRANSFORAMINAL LUMBAR INTERBODY FUSION RIGHT THORACIC TWELVE-LUMBAR ONE, POSTERIOR FUSION THORACIC TEN-ELEVEN, THORACIC ELEVEN-TWELVE AND THORACIC TWELVE-LUMBAR ONE, DEPUY MPACT SCREWS, RODS, LOCAL BONE GRAFT, ALLOGRAFT BONE GRAFT, VIVIGEN II (N/A)  Patient reports pain as moderate.    Objective:   VITALS:  Temp:  [98 F (36.7 C)-99.2 F (37.3 C)] 99.2 F (37.3 C) (06/06 1242) Pulse Rate:  [69-79] 79 (06/06 1242) Resp:  [16-20] 20 (06/06 1242) BP: (112-120)/(56-73) 120/70 (06/06 1242) SpO2:  [96 %-100 %] 98 % (06/06 1242) Awake, alert and oriented x 4. Voiding without difficulty. Standing and walking with PT. Lives alone,son works but will help out.    Neurologically intact ABD soft Neurovascular intact Sensation intact distally Intact pulses distally Dorsiflexion/Plantar flexion intact Incision: dressing C/D/I and moderate drainage Compartment soft   LABS Recent Labs    10/01/17 0726  HGB 10.5*  WBC 9.2  PLT 171   Recent Labs    10/01/17 0726  NA 139  K 3.8  CL 107  CO2 24  BUN 20  CREATININE 1.21*  GLUCOSE 114*   No results for input(s): LABPT, INR in the last 72 hours.   Assessment/Plan: 2 Days Post-Op Procedure(s) (LRB): TRANSFORAMINAL LUMBAR INTERBODY FUSION RIGHT THORACIC TWELVE-LUMBAR ONE, POSTERIOR FUSION THORACIC TEN-ELEVEN, THORACIC ELEVEN-TWELVE AND THORACIC TWELVE-LUMBAR ONE, DEPUY MPACT SCREWS, RODS, LOCAL BONE GRAFT, ALLOGRAFT BONE GRAFT, VIVIGEN II (N/A)  Advance diet Up with therapy D/C IV fluids  Vira BrownsJames Nitka 10/02/2017, 1:49 PMPatient ID: Nicky PughPallis L Apperson, female   DOB: June 05, 1945, 72 y.o.   MRN: [id number]

## 2017-10-03 LAB — HEMOGLOBIN AND HEMATOCRIT, BLOOD
HCT: 30 % — ABNORMAL LOW (ref 36.0–46.0)
Hemoglobin: 9.2 g/dL — ABNORMAL LOW (ref 12.0–15.0)

## 2017-10-03 MED ORDER — OXYCODONE HCL ER 10 MG PO T12A: 10.0000 mg | EXTENDED_RELEASE_TABLET | Freq: Two times a day (BID) | ORAL | 0 refills | Status: DC

## 2017-10-03 MED ORDER — OXYCODONE HCL 10 MG PO TABS: 10.0000 mg | ORAL_TABLET | ORAL | 0 refills | Status: DC | PRN

## 2017-10-03 MED ORDER — GABAPENTIN 300 MG PO CAPS: 300.0000 mg | ORAL_CAPSULE | Freq: Three times a day (TID) | ORAL | 2 refills | Status: DC

## 2017-10-03 MED ORDER — METHOCARBAMOL 500 MG PO TABS: 500.0000 mg | ORAL_TABLET | Freq: Three times a day (TID) | ORAL | 0 refills | Status: DC | PRN

## 2017-10-03 MED FILL — Heparin Sodium (Porcine) Inj 1000 Unit/ML: INTRAMUSCULAR | Qty: 30 | Status: AC

## 2017-10-03 MED FILL — Sodium Chloride IV Soln 0.9%: INTRAVENOUS | Qty: 2000 | Status: AC

## 2017-10-03 NOTE — Progress Notes (Signed)
Physical Therapy Treatment Patient Details Name: [name] MRN: [id number] DOB: 1945-12-04 Today's Date: 10/03/2017    History of Present Illness 72 yo female s/p TRANSFORAMINAL LUMBAR INTERBODY FUSION RIGHT THORACIC TWELVE-LUMBAR ONE, POSTERIOR FUSION THORACIC TEN-ELEVEN, THORACIC ELEVEN-TWELVE AND THORACIC TWELVE-LUMBAR ONE, DEPUY MPACT SCREWS, RODS, LOCAL BONE GRAFT, ALLOGRAFT BONE GRAFT, VIVIGEN II     PT Comments    Patient agreeable to participate in therapy. Pt continues to c/o "feeling funny" and dizzy in supine and with mobility today. Pt required min guard assist for OOB mobility and demonstrated mod I for bed mobility. Current plan remains appropriate.    Follow Up Recommendations  Home health PT;Follow surgeon's recommendation for DC plan and follow-up therapies     Equipment Recommendations  Other (comment)(equipment delivered to room)    Recommendations for Other Services       Precautions / Restrictions Precautions Precautions: Back Precaution Comments: pt able to recall 3/3 precautions Required Braces or Orthoses: Spinal Brace Spinal Brace: Applied in sitting position;Thoracolumbosacral orthotic Restrictions Weight Bearing Restrictions: No    Mobility  Bed Mobility Overal bed mobility: Modified Independent Bed Mobility: Rolling;Sidelying to Sit           General bed mobility comments: use of rail and increased time and effort  Transfers Overall transfer level: Needs assistance Equipment used: Rolling walker (2 wheeled) Transfers: Sit to/from Stand Sit to Stand: Min guard         General transfer comment: cues for safe hand placement and increased time  Ambulation/Gait Ambulation/Gait assistance: Min guard Ambulation Distance (Feet): 120 Feet Assistive device: Rolling walker (2 wheeled) Gait Pattern/deviations: Step-through pattern;Decreased stride length;Trunk flexed Gait velocity: decreased   General Gait Details: cues for posture     Stairs             Wheelchair Mobility    Modified Rankin (Stroke Patients Only)       Balance Overall balance assessment: Needs assistance Sitting-balance support: Feet supported;No upper extremity supported Sitting balance-Leahy Scale: Fair     Standing balance support: Bilateral upper extremity supported;During functional activity Standing balance-Leahy Scale: Fair                              Cognition Arousal/Alertness: Awake/alert Behavior During Therapy: WFL for tasks assessed/performed Overall Cognitive Status: Within Functional Limits for tasks assessed                                        Exercises      General Comments        Pertinent Vitals/Pain Pain Assessment: No/denies pain    Home Living                      Prior Function            PT Goals (current goals can now be found in the care plan section) Acute Rehab PT Goals PT Goal Formulation: With patient Time For Goal Achievement: 10/15/17 Potential to Achieve Goals: Good Progress towards PT goals: Progressing toward goals    Frequency    Min 5X/week      PT Plan Current plan remains appropriate    Co-evaluation              AM-PAC PT "6 Clicks" Daily Activity  Outcome Measure  Difficulty turning over in bed (  including adjusting bedclothes, sheets and blankets)?: Unable Difficulty moving from lying on back to sitting on the side of the bed? : Unable Difficulty sitting down on and standing up from a chair with arms (e.g., wheelchair, bedside commode, etc,.)?: Unable Help needed moving to and from a bed to chair (including a wheelchair)?: A Little Help needed walking in hospital room?: A Little Help needed climbing 3-5 steps with a railing? : A Little 6 Click Score: 12    End of Session Equipment Utilized During Treatment: Gait belt Activity Tolerance: Patient tolerated treatment well Patient left: with call bell/phone  within reach;in chair;with family/visitor present Nurse Communication: Mobility status PT Visit Diagnosis: Unsteadiness on feet (R26.81);Other abnormalities of gait and mobility (R26.89);Muscle weakness (generalized) (M62.81);Pain Pain - part of body: (low back)     Time: 1610-9604 PT Time Calculation (min) (ACUTE ONLY): 25 min  Charges:  $Gait Training: 8-22 mins $Therapeutic Activity: 8-22 mins                    G Codes:       Erline Levine, PTA Pager: [phone]     Carolynne Edouard 10/03/2017, 1:12 PM

## 2017-10-03 NOTE — Care Management Important Message (Signed)
Important Message  Patient Details  Name: [name] L Deridder MRN: [id number] Date of Birth: [birth year]   Medicare Important Message Given:  Yes    Dorena BodoIris Raigan Baria 10/03/2017, 3:33 PM

## 2017-10-03 NOTE — Progress Notes (Signed)
     Subjective: 3 Days Post-Op Procedure(s) (LRB): TRANSFORAMINAL LUMBAR INTERBODY FUSION RIGHT THORACIC TWELVE-LUMBAR ONE, POSTERIOR FUSION THORACIC TEN-ELEVEN, THORACIC ELEVEN-TWELVE AND THORACIC TWELVE-LUMBAR ONE, DEPUY MPACT SCREWS, RODS, LOCAL BONE GRAFT, ALLOGRAFT BONE GRAFT, VIVIGEN II (N/A) Awake, alert and oriented x 4. Standing and donning brace with assistance. OT and PT to continue with acute therapy. Will plan for discharge tomorrow if she is stable and more independent. HHN for PT.  Patient reports pain as moderate.    Objective:   VITALS:  Temp:  [98.2 F (36.8 C)-100.3 F (37.9 C)] 99.8 F (37.7 C) (06/07 0451) Pulse Rate:  [71-94] 75 (06/07 0451) Resp:  [18-20] 18 (06/07 0451) BP: (103-151)/(56-76) 103/62 (06/07 0451) SpO2:  [96 %-100 %] 97 % (06/07 0451)  Neurologically intact ABD soft Neurovascular intact Sensation intact distally Intact pulses distally Dorsiflexion/Plantar flexion intact Incision: dressing C/D/I and scant drainage   LABS Recent Labs    10/01/17 0726 10/02/17 1455  HGB 10.5* 9.4*  WBC 9.2 8.7  PLT 171 132*   Recent Labs    10/01/17 0726 10/02/17 1410  NA 139 139  K 3.8 4.0  CL 107 109  CO2 24 24  BUN 20 15  CREATININE 1.21* 1.17*  GLUCOSE 114* 129*   No results for input(s): LABPT, INR in the last 72 hours.   Assessment/Plan: 3 Days Post-Op Procedure(s) (LRB): TRANSFORAMINAL LUMBAR INTERBODY FUSION RIGHT THORACIC TWELVE-LUMBAR ONE, POSTERIOR FUSION THORACIC TEN-ELEVEN, THORACIC ELEVEN-TWELVE AND THORACIC TWELVE-LUMBAR ONE, DEPUY MPACT SCREWS, RODS, LOCAL BONE GRAFT, ALLOGRAFT BONE GRAFT, VIVIGEN II (N/A)  Advance diet Up with therapy Plan for discharge tomorrow  PT OT today.   Denise BrownsJames Lameshia Forbes 10/03/2017, 8:39 AMPatient ID: [name] Care, female   DOB: Jun 10, 1945, 72 y.o.   MRN: [id number]

## 2017-10-04 ENCOUNTER — Encounter (HOSPITAL_COMMUNITY): Payer: Self-pay | Admitting: General Practice

## 2017-10-04 ENCOUNTER — Telehealth: Payer: Self-pay | Admitting: Family Medicine

## 2017-10-04 NOTE — Telephone Encounter (Signed)
Called by hospital staff asking about discharge for patient. I explained that she had contacted the outpatient care team and most of us do not dictate inpatient management. I suggested she contact the patient's primary service for discharge information.

## 2017-10-04 NOTE — Progress Notes (Signed)
Physical Therapy Treatment Patient Details Name: [name] MRN: [id number] DOB: May 24, 1945 Today's Date: 10/04/2017    History of Present Illness 72 yo female s/p TRANSFORAMINAL LUMBAR INTERBODY FUSION RIGHT THORACIC TWELVE-LUMBAR ONE, POSTERIOR FUSION THORACIC TEN-ELEVEN, THORACIC ELEVEN-TWELVE AND THORACIC TWELVE-LUMBAR ONE, DEPUY MPACT SCREWS, RODS, LOCAL BONE GRAFT, ALLOGRAFT BONE GRAFT, VIVIGEN II     PT Comments    Patient seen for mobility progression. Tolerated well. Some ocasional cues for posture and positioning. Cues for donning of brace as well. Able to recall 3/3 precautions. Current POC remains appropriate.   Follow Up Recommendations  Home health PT;Follow surgeon's recommendation for DC plan and follow-up therapies     Equipment Recommendations  Other (comment)(equipment delivered to room)    Recommendations for Other Services       Precautions / Restrictions Precautions Precautions: Back Precaution Comments: pt able to recall 3/3 precautions Required Braces or Orthoses: Spinal Brace Spinal Brace: Applied in sitting position;Thoracolumbosacral orthotic Restrictions Weight Bearing Restrictions: No    Mobility  Bed Mobility Overal bed mobility: Modified Independent Bed Mobility: Rolling;Sidelying to Sit           General bed mobility comments: use of rail and increased time and effort  Transfers Overall transfer level: Needs assistance Equipment used: Rolling walker (2 wheeled) Transfers: Sit to/from Stand Sit to Stand: Min guard         General transfer comment: cues for safe hand placement and increased time  Ambulation/Gait Ambulation/Gait assistance: Min guard Ambulation Distance (Feet): 180 Feet Assistive device: Rolling walker (2 wheeled) Gait Pattern/deviations: Step-through pattern;Decreased stride length;Trunk flexed Gait velocity: decreased Gait velocity interpretation: <1.8 ft/sec, indicate of risk for recurrent falls General  Gait Details: Vcs for increased cadence and upright posture   Stairs             Wheelchair Mobility    Modified Rankin (Stroke Patients Only)       Balance Overall balance assessment: Needs assistance Sitting-balance support: Feet supported;No upper extremity supported Sitting balance-Leahy Scale: Fair     Standing balance support: Bilateral upper extremity supported;During functional activity Standing balance-Leahy Scale: Fair Standing balance comment: reliance on RW for stability                            Cognition Arousal/Alertness: Awake/alert Behavior During Therapy: WFL for tasks assessed/performed Overall Cognitive Status: Within Functional Limits for tasks assessed                                        Exercises      General Comments        Pertinent Vitals/Pain Pain Assessment: Faces Faces Pain Scale: Hurts little more Pain Location: back Pain Descriptors / Indicators: Sore Pain Intervention(s): Monitored during session    Home Living                      Prior Function            PT Goals (current goals can now be found in the care plan section) Acute Rehab PT Goals Patient Stated Goal: to go home PT Goal Formulation: With patient Time For Goal Achievement: 10/15/17 Potential to Achieve Goals: Good Progress towards PT goals: Progressing toward goals    Frequency    Min 5X/week      PT Plan Current plan remains appropriate  Co-evaluation              AM-PAC PT "6 Clicks" Daily Activity  Outcome Measure  Difficulty turning over in bed (including adjusting bedclothes, sheets and blankets)?: Unable Difficulty moving from lying on back to sitting on the side of the bed? : Unable Difficulty sitting down on and standing up from a chair with arms (e.g., wheelchair, bedside commode, etc,.)?: Unable Help needed moving to and from a bed to chair (including a wheelchair)?: A Little Help  needed walking in hospital room?: A Little Help needed climbing 3-5 steps with a railing? : A Little 6 Click Score: 12    End of Session Equipment Utilized During Treatment: Gait belt Activity Tolerance: Patient tolerated treatment well Patient left: with call bell/phone within reach;in chair;with family/visitor present Nurse Communication: Mobility status PT Visit Diagnosis: Unsteadiness on feet (R26.81);Other abnormalities of gait and mobility (R26.89);Muscle weakness (generalized) (M62.81);Pain Pain - part of body: (low back)     Time: [id number] PT Time Calculation (min) (ACUTE ONLY): 16 min  Charges:  $Gait Training: 8-22 mins                    G Codes:       Charlotte Crumbevon Mckennah Kretchmer, PT DPT  Board Certified Neurologic Specialist [phone]    Fabio AsaDevon J Rafferty Postlewait 10/04/2017, 9:27 AM

## 2017-10-04 NOTE — Discharge Summary (Addendum)
Patient ID: [name] MRN: [id number] DOB/AGE: [birth year] 72 y.o.  Admit date: 09/30/2017 Discharge date: 10/04/2017  Admission Diagnoses:  Principal Problem:   Neurogenic claudication due to lumbar spinal stenosis Active Problems:   Fusion of spine of thoracolumbar region   Discharge Diagnoses:  Same  Past Medical History:  Diagnosis Date  . Allergy   . Arthritis   . Cataract of right eye   . Diastolic dysfunction   . Dizzy   . GERD (gastroesophageal reflux disease)   . History of blood transfusion   . Hyperlipidemia   . Hypertension   . Pancreatic cyst 02/2015   Recommend follow-up MRI in 12 months.  . Pneumonia    "years ago"    Surgeries: Procedure(s): TRANSFORAMINAL LUMBAR INTERBODY FUSION RIGHT THORACIC TWELVE-LUMBAR ONE, POSTERIOR FUSION THORACIC TEN-ELEVEN, THORACIC ELEVEN-TWELVE AND THORACIC TWELVE-LUMBAR ONE, DEPUY MPACT SCREWS, RODS, LOCAL BONE GRAFT, ALLOGRAFT BONE GRAFT, VIVIGEN II on 09/30/2017   Consultants:   Discharged Condition: Improved  Hospital Course: [name] is an 72 y.o. female who was admitted 09/30/2017 for operative treatment ofNeurogenic claudication due to lumbar spinal stenosis. Patient has severe unremitting pain that affects sleep, daily activities, and work/hobbies. After pre-op clearance the patient was taken to the operating room on 09/30/2017 and underwent  Procedure(s): TRANSFORAMINAL LUMBAR INTERBODY FUSION RIGHT THORACIC TWELVE-LUMBAR ONE, POSTERIOR FUSION THORACIC TEN-ELEVEN, THORACIC ELEVEN-TWELVE AND THORACIC TWELVE-LUMBAR ONE, DEPUY MPACT SCREWS, RODS, LOCAL BONE GRAFT, ALLOGRAFT BONE GRAFT, VIVIGEN II.    Patient was given perioperative antibiotics:  Anti-infectives (From admission, onward)   Start     Dose/Rate Route Frequency Ordered Stop   09/30/17 1830  vancomycin (VANCOCIN) 1,250 mg in sodium chloride 0.9 % 250 mL IVPB     1,250 mg 166.7 mL/hr over 90 Minutes Intravenous  Once 09/30/17 1731 09/30/17 2145   09/30/17 0600  vancomycin (VANCOCIN) 1,500 mg in sodium chloride 0.9 % 500 mL IVPB     1,500 mg 250 mL/hr over 120 Minutes Intravenous On call to O.R. 09/29/17 1207 09/30/17 1919       Patient was given sequential compression devices, early ambulation, and chemoprophylaxis to prevent DVT.  Patient benefited maximally from hospital stay and there were no complications.    Recent vital signs:  Patient Vitals for the past 24 hrs:  BP Temp Temp src Pulse Resp SpO2  10/04/17 0811 123/80 98.2 F (36.8 C) Oral 71 18 100 %  10/04/17 0413 133/69 97.9 F (36.6 C) Oral 74 18 99 %  10/04/17 0016 138/75 98.4 F (36.9 C) Oral 64 18 98 %  10/03/17 2151 125/75 - - - - -  10/03/17 2017 125/72 98.7 F (37.1 C) Oral 66 18 99 %  10/03/17 1622 127/75 98.7 F (37.1 C) Oral 63 16 98 %  10/03/17 1216 123/69 98.1 F (36.7 C) Oral 66 20 97 %  10/03/17 0911 (!) 132/93 - - 75 - -  10/03/17 0907 129/88 - - 72 - (!) 85 %  10/03/17 0904 131/85 - - 73 - 99 %  10/03/17 0859 108/65 98.1 F (36.7 C) Oral 68 20 100 %     Recent laboratory studies:  Recent Labs    10/02/17 1410 10/02/17 1455 10/03/17 0917  WBC  --  8.7  --   HGB  --  9.4* 9.2*  HCT  --  30.0* 30.0*  PLT  --  132*  --   NA 139  --   --   K 4.0  --   --  CL 109  --   --   CO2 24  --   --   BUN 15  --   --   CREATININE 1.17*  --   --   GLUCOSE 129*  --   --   CALCIUM 8.0*  --   --      Discharge Medications:   Allergies as of 10/04/2017      Reactions   Lisinopril Swelling   Swelling in mouth and throat   Penicillins Rash   Has patient had a PCN reaction causing immediate rash, facial/tongue/throat swelling, SOB or lightheadedness with hypotension: No Has patient had a PCN reaction causing severe rash involving mucus membranes or skin necrosis: No Has patient had a PCN reaction that required hospitalization No Has patient had a PCN reaction occurring within the last 10 years: No If all of the above answers are "NO", then may  proceed with Cephalosporin use.   Tramadol Hcl Rash   Tramadol Hcl Rash      Medication List    STOP taking these medications   acetaminophen 500 MG tablet Commonly known as:  TYLENOL   HYDROcodone-acetaminophen 5-325 MG tablet Commonly known as:  NORCO/VICODIN   naproxen 500 MG tablet Commonly known as:  NAPROSYN     TAKE these medications   amLODipine 5 MG tablet Commonly known as:  NORVASC Take 1 tablet (5 mg total) by mouth at bedtime.   aspirin EC 81 MG tablet Take 81 mg by mouth daily.   Fish Oil 1000 MG Caps Take 1 capsule by mouth every morning.   gabapentin 300 MG capsule Commonly known as:  NEURONTIN Take 1 capsule (300 mg total) by mouth 3 (three) times daily.   methocarbamol 500 MG tablet Commonly known as:  ROBAXIN Take 1 tablet (500 mg total) by mouth every 8 (eight) hours as needed for muscle spasms.   multivitamin with minerals Tabs tablet Take 1 tablet by mouth daily.   omeprazole 20 MG capsule Commonly known as:  PRILOSEC Take 20 mg by mouth daily.   oxyCODONE 10 mg 12 hr tablet Commonly known as:  OXYCONTIN Take 1 tablet (10 mg total) by mouth every 12 (twelve) hours. Use with post operative pain due to multilevel thoracolumbar fusion.   Oxycodone HCl 10 MG Tabs Take 1 tablet (10 mg total) by mouth every 4 (four) hours as needed for severe pain ((score 7 to 10)).   ranitidine 300 MG tablet Commonly known as:  ZANTAC Take 1 tablet (300 mg total) by mouth at bedtime. Need Office visit for further refills What changed:    when to take this  reasons to take this  additional instructions   simvastatin 20 MG tablet Commonly known as:  ZOCOR TAKE 1 TABLET BY MOUTH  DAILY AT 6 PM.            Durable Medical Equipment  (From admission, onward)        Start     Ordered   09/30/17 1712  DME Walker rolling  Once    Question:  Patient needs a walker to treat with the following condition  Answer:  Fusion of spine of thoracolumbar  region   09/30/17 1711   09/30/17 1712  DME 3 n 1  Once     09/30/17 1711      Diagnostic Studies: Dg Lumbar Spine Complete  Result Date: 09/30/2017 CLINICAL DATA:  Thoracolumbar fusion T10-L1, prior lumbar fusion EXAM: DG C-ARM 61-120 MIN; LUMBAR SPINE - COMPLETE  4+ VIEW COMPARISON:  CT thoracic and lumbar spine 05/05/2017 FLUOROSCOPY TIME:  2 minutes 19 seconds Images submitted: 8 FINDINGS: BILATERAL pedicle screws and posterior bars and now identified from T10-L1. Diffuse osseous demineralization. Vertebral body heights maintained. Previously identified posterior hardware at L1-L5 again seen. Anterolisthesis L4-L5 appears unchanged. No acute fracture or additional subluxation. IMPRESSION: New T10-L1 posterior fusion with again identified L1-L5 posterior fusion. Osseous demineralization. Electronically Signed   By: Ulyses Southward M.D.   On: 09/30/2017 15:59   Dg C-arm 1-60 Min  Result Date: 09/30/2017 CLINICAL DATA:  Thoracolumbar fusion T10-L1, prior lumbar fusion EXAM: DG C-ARM 61-120 MIN; LUMBAR SPINE - COMPLETE 4+ VIEW COMPARISON:  CT thoracic and lumbar spine 05/05/2017 FLUOROSCOPY TIME:  2 minutes 19 seconds Images submitted: 8 FINDINGS: BILATERAL pedicle screws and posterior bars and now identified from T10-L1. Diffuse osseous demineralization. Vertebral body heights maintained. Previously identified posterior hardware at L1-L5 again seen. Anterolisthesis L4-L5 appears unchanged. No acute fracture or additional subluxation. IMPRESSION: New T10-L1 posterior fusion with again identified L1-L5 posterior fusion. Osseous demineralization. Electronically Signed   By: Ulyses Southward M.D.   On: 09/30/2017 15:59   Dg C-arm 1-60 Min  Result Date: 09/30/2017 CLINICAL DATA:  Thoracolumbar fusion T10-L1, prior lumbar fusion EXAM: DG C-ARM 61-120 MIN; LUMBAR SPINE - COMPLETE 4+ VIEW COMPARISON:  CT thoracic and lumbar spine 05/05/2017 FLUOROSCOPY TIME:  2 minutes 19 seconds Images submitted: 8 FINDINGS: BILATERAL  pedicle screws and posterior bars and now identified from T10-L1. Diffuse osseous demineralization. Vertebral body heights maintained. Previously identified posterior hardware at L1-L5 again seen. Anterolisthesis L4-L5 appears unchanged. No acute fracture or additional subluxation. IMPRESSION: New T10-L1 posterior fusion with again identified L1-L5 posterior fusion. Osseous demineralization. Electronically Signed   By: Ulyses Southward M.D.   On: 09/30/2017 15:59   Mm Screening Breast Tomo Bilateral  Result Date: 09/10/2017 CLINICAL DATA:  Screening. EXAM: DIGITAL SCREENING BILATERAL MAMMOGRAM WITH TOMO AND CAD COMPARISON:  Previous exam(s). ACR Breast Density Category b: There are scattered areas of fibroglandular density. FINDINGS: There are no findings suspicious for malignancy. Images were processed with CAD. IMPRESSION: No mammographic evidence of malignancy. A result letter of this screening mammogram will be mailed directly to the patient. RECOMMENDATION: Screening mammogram in one year. (Code:[id number]-B-01Y) BI-RADS CATEGORY  1: Negative. Electronically Signed   By: Ted Mcalpine M.D.   On: 09/10/2017 15:23    Disposition: Discharge disposition: 01-Home or Self Care       Discharge Instructions    Call MD / Call 911   Complete by:  As directed    If you experience chest pain or shortness of breath, CALL 911 and be transported to the hospital emergency room.  If you develope a fever above 101 F, pus (white drainage) or increased drainage or redness at the wound, or calf pain, call your surgeon's office.   Constipation Prevention   Complete by:  As directed    Drink plenty of fluids.  Prune juice may be helpful.  You may use a stool softener, such as Colace (over the counter) 100 mg twice a day.  Use MiraLax (over the counter) for constipation as needed.   Diet - low sodium heart healthy   Complete by:  As directed    Discharge instructions   Complete by:  As directed    Call if there is  increasing drainage, fever greater than 101.5, severe head aches, and worsening nausea or light sensitivity. If shortness of breath, bloody cough or chest  tightness or pain go to an emergency room. No lifting greater than 10 lbs. Avoid bending, stooping and twisting. Use brace when sitting and out of bed even to go to bathroom. Walk in house for first 2 weeks then may start to get out slowly increasing distances up to one quarter mile by 4-6 weeks post op. After 5 days may shower and change dressing following bathing with shower.When bathing remove the brace shower and replace brace before getting out of the shower. If drainage, keep dry dressing and do not bathe the incision, use an moisture impervious dressing. Please call and return for scheduled follow up appointment 2 weeks from the time of surgery.   Driving restrictions   Complete by:  As directed    No driving for 4 weeks   Increase activity slowly as tolerated   Complete by:  As directed    Lifting restrictions   Complete by:  As directed    No lifting for 12 weeks      Follow-up Information    Kerrin Champagne, MD In 2 weeks.   Specialty:  Orthopedic Surgery Why:  For wound re-check Contact information: [address] [place] [address] [phone]        Evaristo Bury, NP Follow up in 2 week(s).   Specialty:  Nurse Practitioner Why:  Anemia, low grade renal insufficiency. Contact information: [address] [place] [address] [phone]            Signed: Cristie Hem 10/04/2017, 8:47 AM

## 2017-10-04 NOTE — Progress Notes (Addendum)
Subjective: 4 Days Post-Op Procedure(s) (LRB): TRANSFORAMINAL LUMBAR INTERBODY FUSION RIGHT THORACIC TWELVE-LUMBAR ONE, POSTERIOR FUSION THORACIC TEN-ELEVEN, THORACIC ELEVEN-TWELVE AND THORACIC TWELVE-LUMBAR ONE, DEPUY MPACT SCREWS, RODS, LOCAL BONE GRAFT, ALLOGRAFT BONE GRAFT, VIVIGEN II (N/A) Patient reports pain as mild.  Doing well this am.  Objective: Vital signs in last 24 hours: Temp:  [97.9 F (36.6 C)-98.7 F (37.1 C)] 98.2 F (36.8 C) (06/08 0811) Pulse Rate:  [63-75] 71 (06/08 0811) Resp:  [16-20] 18 (06/08 0811) BP: (108-138)/(65-93) 123/80 (06/08 0811) SpO2:  [85 %-100 %] 100 % (06/08 0811)  Intake/Output from previous day: 06/07 0701 - 06/08 0700 In: 240 [P.O.:240] Out: -  Intake/Output this shift: No intake/output data recorded.  Recent Labs    10/02/17 1455 10/03/17 0917  HGB 9.4* 9.2*   Recent Labs    10/02/17 1455 10/03/17 0917  WBC 8.7  --   RBC 3.79*  --   HCT 30.0* 30.0*  PLT 132*  --    Recent Labs    10/02/17 1410  NA 139  K 4.0  CL 109  CO2 24  BUN 15  CREATININE 1.17*  GLUCOSE 129*  CALCIUM 8.0*   No results for input(s): LABPT, INR in the last 72 hours.  Neurologically intact Neurovascular intact Sensation intact distally Intact pulses distally Dorsiflexion/Plantar flexion intact    Assessment/Plan: 4 Days Post-Op Procedure(s) (LRB): TRANSFORAMINAL LUMBAR INTERBODY FUSION RIGHT THORACIC TWELVE-LUMBAR ONE, POSTERIOR FUSION THORACIC TEN-ELEVEN, THORACIC ELEVEN-TWELVE AND THORACIC TWELVE-LUMBAR ONE, DEPUY MPACT SCREWS, RODS, LOCAL BONE GRAFT, ALLOGRAFT BONE GRAFT, VIVIGEN II (N/A) Advance diet Up with therapy D/C IV fluids Discharge home with home health tomorrow. (patient states son who lives at her house says power has been flickering. Patient worried about going home with possible power outage.  She will benefit from more PT [facility]   Bernal Luhman L Camdynn Maranto 10/04/2017, 8:27 AM

## 2017-10-04 NOTE — Progress Notes (Signed)
Occupational Therapy Treatment Patient Details Name: [name] MRN: [id number] DOB: October 28, 1945 Today's Date: 10/04/2017    History of present illness 72 yo female s/p TRANSFORAMINAL LUMBAR INTERBODY FUSION RIGHT THORACIC TWELVE-LUMBAR ONE, POSTERIOR FUSION THORACIC TEN-ELEVEN, THORACIC ELEVEN-TWELVE AND THORACIC TWELVE-LUMBAR ONE, DEPUY MPACT SCREWS, RODS, LOCAL BONE GRAFT, ALLOGRAFT BONE GRAFT, VIVIGEN II    OT comments  Pt demonstrating progress toward OT goals. She reports that she will be wearing only gowns at home. Continued education concerning potential use of AE for LB ADL. Pt reports that she is unsure if she will be able to purchase these. She reports that her cousins and neighbors will be able to assist her at home post-acute D/C when her son is not present. Pt educated concerning use of 3-in-1 as a shower seat and pt requires heavy min assist to complete safely. She fatigues easily and demonstrates decreased activity tolerance for ADL participation. She is; however, very motivated to return to PLOF. Will continue to follow while admitted.    Follow Up Recommendations  Home health OT;Supervision/Assistance - 24 hour(initial 24 hour assistance)    Equipment Recommendations  3 in 1 bedside commode;Tub/shower seat;Other (comment)(RW - 2 wheeled; AE kit)    Recommendations for Other Services Other (comment)(pt requesting [place] aide)    Precautions / Restrictions Precautions Precautions: Back Precaution Comments: pt able to recall 3/3 precautions Required Braces or Orthoses: Spinal Brace Spinal Brace: Applied in sitting position;Thoracolumbosacral orthotic Restrictions Weight Bearing Restrictions: No       Mobility Bed Mobility Overal bed mobility: Needs Assistance Bed Mobility: Rolling;Sidelying to Sit;Sit to Sidelying Rolling: Supervision Sidelying to sit: Supervision     Sit to sidelying: Supervision General bed mobility comments: Cues for sequence. Lowered rail to  simulate home.   Transfers Overall transfer level: Needs assistance Equipment used: Rolling walker (2 wheeled) Transfers: Sit to/from Stand Sit to Stand: Min guard         General transfer comment: Guarding assist for safety.     Balance Overall balance assessment: Needs assistance Sitting-balance support: Feet supported;No upper extremity supported Sitting balance-Leahy Scale: Fair     Standing balance support: Bilateral upper extremity supported;During functional activity Standing balance-Leahy Scale: Fair Standing balance comment: Able to statically stand without UE support.                            ADL either performed or assessed with clinical judgement   ADL Overall ADL's : Needs assistance/impaired Eating/Feeding: Independent   Grooming: Supervision/safety;Standing           Upper Body Dressing : Minimal assistance;Sitting Upper Body Dressing Details (indicate cue type and reason): cues and some assistance to align fasteners of brace Lower Body Dressing: Sit to/from stand;Maximal assistance Lower Body Dressing Details (indicate cue type and reason): Pt does not recall information concerning AE. Discussed this but she states she will not be able to purchase. She plans on wearing gowns. Educated on need for hands on assistance to adhere to precautions. She verbalizes understanding. Will continue to address AE.  Toilet Transfer: Min guard;Ambulation;RW     Toileting - Clothing Manipulation Details (indicate cue type and reason): Not assessed today. Pt reports that she has long handled brush that she will be using for toileting hygiene.  Tub/ Shower Transfer: Minimal assistance;Tub transfer;Ambulation;Shower Technical sales engineer Details (indicate cue type and reason): Educated also on use of 3-in-1 as shower seat as unsure if will be able to  obtain shower chair.  Functional mobility during ADLs: Min guard;Rolling walker General ADL  Comments: Pt educated concerning safe tub/shower transfers. She was educated on the need to have assistance at home for bathing/dressing tasks to maximize safety. Reports either her cousin, son, or neighbors will be able to assist.      Vision       Perception     Praxis      Cognition Arousal/Alertness: Awake/alert Behavior During Therapy: WFL for tasks assessed/performed Overall Cognitive Status: Within Functional Limits for tasks assessed                                          Exercises     Shoulder Instructions       General Comments      Pertinent Vitals/ Pain       Pain Assessment: Faces Faces Pain Scale: Hurts a little bit Pain Location: back Pain Descriptors / Indicators: Sore Pain Intervention(s): Monitored during session;Repositioned  Home Living                                          Prior Functioning/Environment              Frequency  Min 2X/week        Progress Toward Goals  OT Goals(current goals can now be found in the care plan section)  Progress towards OT goals: Progressing toward goals  Acute Rehab OT Goals Patient Stated Goal: to go home OT Goal Formulation: With patient Time For Goal Achievement: 10/15/17 Potential to Achieve Goals: Good ADL Goals Pt Will Perform Lower Body Dressing: with supervision;with adaptive equipment;sit to/from stand  Plan Discharge plan remains appropriate;Frequency remains appropriate    Co-evaluation                 AM-PAC PT "6 Clicks" Daily Activity     Outcome Measure   Help from another person eating meals?: None Help from another person taking care of personal grooming?: None Help from another person toileting, which includes using toliet, bedpan, or urinal?: A Little Help from another person bathing (including washing, rinsing, drying)?: A Little Help from another person to put on and taking off regular upper body clothing?: A Little Help from  another person to put on and taking off regular lower body clothing?: A Lot 6 Click Score: 19    End of Session Equipment Utilized During Treatment: Gait belt;Rolling walker  OT Visit Diagnosis: Unsteadiness on feet (R26.81)   Activity Tolerance Patient tolerated treatment well   Patient Left in chair;with call bell/phone within reach   Nurse Communication Mobility status;Precautions        Time: 3664-4034 OT Time Calculation (min): 45 min  Charges: OT General Charges $OT Visit: 1 Visit OT Treatments $Self Care/Home Management : 38-52 mins  Norman Herrlich, MS OTR/L  Pager: [place] A Montine Hight 10/04/2017, 5:23 PM

## 2017-10-05 NOTE — Progress Notes (Signed)
Occupational Therapy Treatment Patient Details Name: [name] MRN: [id number] DOB: [birth year] Today's Date: 10/05/2017    History of present illness 72 yo female s/p TRANSFORAMINAL LUMBAR INTERBODY FUSION RIGHT THORACIC TWELVE-LUMBAR ONE, POSTERIOR FUSION THORACIC TEN-ELEVEN, THORACIC ELEVEN-TWELVE AND THORACIC TWELVE-LUMBAR ONE, DEPUY MPACT SCREWS, RODS, LOCAL BONE GRAFT, ALLOGRAFT BONE GRAFT, VIVIGEN II    OT comments  Pt. Able to complete bed mobility, toileting, and standing grooming tasks this session. Eager for d/c home and pt. And son who was present report pt. Will have family and friends assisting upon d/c home.   Follow Up Recommendations  Home health OT;Supervision/Assistance - 24 hour    Equipment Recommendations  3 in 1 bedside commode;Tub/shower seat;Other (comment)    Recommendations for Other Services      Precautions / Restrictions Precautions Precautions: Back Precaution Comments: pt able to recall 3/3 precautions Required Braces or Orthoses: Spinal Brace Spinal Brace: Applied in sitting position;Thoracolumbosacral orthotic       Mobility Bed Mobility Overal bed mobility: Needs Assistance Bed Mobility: Rolling;Sidelying to Sit Rolling: Supervision Sidelying to sit: Supervision       General bed mobility comments: Cues for sequence. Lowered rail to simulate home with hob flat  Transfers Overall transfer level: Needs assistance Equipment used: Rolling walker (2 wheeled) Transfers: Sit to/from UGI CorporationStand;Stand Pivot Transfers Sit to Stand: Min guard Stand pivot transfers: Min guard       General transfer comment: Guarding assist for safety.     Balance                                           ADL either performed or assessed with clinical judgement   ADL Overall ADL's : Needs assistance/impaired     Grooming: Wash/dry hands;Supervision/safety;Standing           Upper Body Dressing : Minimal assistance;Sitting Upper  Body Dressing Details (indicate cue type and reason): cues and some assistance to align fasteners of brace     Toilet Transfer: Min guard;Ambulation;RW   Toileting- Clothing Manipulation and Hygiene: Set up;Sit to/from stand;Supervision/safety     Tub/Shower Transfer Details (indicate cue type and reason): pt. declined "i aint gettin in no shower until im ready, im going to sponge bathe at first" Functional mobility during ADLs: Min guard;Rolling walker General ADL Comments: reports cousins and neighbors will be assisting at home at d/c.  son present and states he will be at home each day until 3 but referenced that the other family members would be doing must of the helping not him     Vision       Perception     Praxis      Cognition Arousal/Alertness: Awake/alert Behavior During Therapy: WFL for tasks assessed/performed Overall Cognitive Status: Within Functional Limits for tasks assessed                                          Exercises     Shoulder Instructions       General Comments      Pertinent Vitals/ Pain       Pain Assessment: No/denies pain  Home Living  Prior Functioning/Environment              Frequency  Min 2X/week        Progress Toward Goals  OT Goals(current goals can now be found in the care plan section)  Progress towards OT goals: Progressing toward goals     Plan Discharge plan remains appropriate;Frequency remains appropriate    Co-evaluation                 AM-PAC PT "6 Clicks" Daily Activity     Outcome Measure   Help from another person eating meals?: None Help from another person taking care of personal grooming?: None Help from another person toileting, which includes using toliet, bedpan, or urinal?: A Little Help from another person bathing (including washing, rinsing, drying)?: A Little Help from another person to put on and  taking off regular upper body clothing?: A Little Help from another person to put on and taking off regular lower body clothing?: A Lot 6 Click Score: 19    End of Session Equipment Utilized During Treatment: Gait belt;Rolling walker;Back brace  OT Visit Diagnosis: Unsteadiness on feet (R26.81)   Activity Tolerance Patient tolerated treatment well   Patient Left with call bell/phone within reach;with family/visitor present(seated eob)   Nurse Communication          Time: 5284-1324 OT Time Calculation (min): 13 min  Charges: OT General Charges $OT Visit: 1 Visit OT Treatments $Self Care/Home Management : 8-22 mins  Robet Leu, COTA/L 10/05/2017, 10:46 AM

## 2017-10-05 NOTE — Progress Notes (Signed)
Pt discharged to home in stable condition.  All discharge instructions and Rx's x3 reviewed with and given to pt and son.  Pt transported via wheelchair, with transporters x 1 and son at chairside.  [id number]

## 2017-10-05 NOTE — Progress Notes (Signed)
Patient doing well this morning and ready to go home.  Surgical dressing changed.  Dc orders placed in computer.

## 2017-10-06 ENCOUNTER — Other Ambulatory Visit: Payer: Self-pay | Admitting: Nurse Practitioner

## 2017-10-06 ENCOUNTER — Telehealth: Payer: Self-pay | Admitting: *Deleted

## 2017-10-06 ENCOUNTER — Telehealth (INDEPENDENT_AMBULATORY_CARE_PROVIDER_SITE_OTHER): Payer: Self-pay | Admitting: Specialist

## 2017-10-06 DIAGNOSIS — K219 Gastro-esophageal reflux disease without esophagitis: Secondary | ICD-10-CM

## 2017-10-06 NOTE — Telephone Encounter (Signed)
Pt will need a F/u for post op no time available

## 2017-10-06 NOTE — Telephone Encounter (Signed)
(  R) Transformal Lumbar interbody fusion. Per summary pt need to f/u w/PCP for low-anemia. Called pt no answer LMOM RTC.[name].Raechel Chute/lmb

## 2017-10-06 NOTE — Telephone Encounter (Signed)
Pt was on TCM report admitted 09/30/17 for Neurogenic claudication due to lumbar spinal stenosis. Pt had a procedure done

## 2017-10-07 ENCOUNTER — Telehealth (INDEPENDENT_AMBULATORY_CARE_PROVIDER_SITE_OTHER): Payer: Self-pay | Admitting: Specialist

## 2017-10-07 NOTE — Telephone Encounter (Signed)
Patient can see Fayrene FearingJames for a 2 week post op

## 2017-10-07 NOTE — Telephone Encounter (Signed)
Transition Care Management Follow-up Telephone Call   Date discharged? 10/04/2017   How have you been since you were released from the hospital? Pt states that she if feeling great!   Do you understand why you were in the hospital? Yes   Do you understand the discharge instructions? yes   Where were you discharged to? Home   Items Reviewed:  Medications reviewed: Yes  Allergies reviewed: Yes  Dietary changes reviewed: No changes  Referrals reviewed: Patient refused HH   Functional Questionnaire:   Activities of Daily Living (ADLs):   States they are independent in the following: All ADL's States they require assistance with the following: n/a   Any transportation issues/concerns? None at this time  Any patient concerns? None   Confirmed importance and date/time of follow-up visits scheduled 09/28/2017 at 1:00pm  Provider Appointment booked with Alphonse GuildAshleigh Shambley, NP  Confirmed with patient if condition begins to worsen call PCP or go to the ER.  Patient was given the office number and encouraged to call back with question or concerns: Pt stated understanding.

## 2017-10-07 NOTE — Telephone Encounter (Signed)
Flor, PT, from [facility] left a message wanting to let Dr. Otelia SergeantNitka know about her visit with the patient.  CB#[phone].  Thank you.

## 2017-10-07 NOTE — Telephone Encounter (Signed)
FYI---Flor (from [facility] PT) called to set up appt for PT and pt declined it, states that she has had 4 other back surgeries and she knows the routine at this point and the restrictions that she has to go by. Flor just wanted Dr. Otelia SergeantNitka to be aware of this.

## 2017-10-08 ENCOUNTER — Telehealth (INDEPENDENT_AMBULATORY_CARE_PROVIDER_SITE_OTHER): Payer: Self-pay | Admitting: Specialist

## 2017-10-08 ENCOUNTER — Other Ambulatory Visit (INDEPENDENT_AMBULATORY_CARE_PROVIDER_SITE_OTHER): Payer: Self-pay | Admitting: Specialist

## 2017-10-08 ENCOUNTER — Telehealth (INDEPENDENT_AMBULATORY_CARE_PROVIDER_SITE_OTHER): Payer: Self-pay

## 2017-10-08 MED ORDER — AMITRIPTYLINE HCL 10 MG PO TABS: 10.0000 mg | ORAL_TABLET | Freq: Every day | ORAL | 1 refills | Status: DC

## 2017-10-08 NOTE — Telephone Encounter (Signed)
Appt  Sched pt appt 10/16/17 9:15am [name]    Med Problem  Oxycodone Pt will not longer be taking med  Gabapentin making pt sick

## 2017-10-08 NOTE — Telephone Encounter (Signed)
I called and advised pt of message below, she has appt to see PCP, on 6/26

## 2017-10-08 NOTE — Telephone Encounter (Signed)
Attached note to other .

## 2017-10-08 NOTE — Telephone Encounter (Signed)
error 

## 2017-10-08 NOTE — Telephone Encounter (Signed)
She should discontinue the gabapentin and make an appointment to see her primary care physician to be sure she does not have a colitis due to antibiotics.

## 2017-10-08 NOTE — Telephone Encounter (Signed)
Triage call: Patient is s/p TLIF 09/30/17.  She is complaining of having to run to the bathroom (loose stools and urinating) with anything she eats/drinks since yesterday.  No fever.  Anything OTC she can take or can something be called in?  Uses [facility] on [address], if something is to be prescribed.  Please advise  --No longer taking Oxycodone, and Gabapentin is making her sick.  ----Please advise

## 2017-10-08 NOTE — Telephone Encounter (Signed)
Triage call: Patient is s/p TLIF 09/30/17.  She is complaining of having to run to the bathroom (loose stools and urinating) with anything she eats/drinks since yesterday.  No fever.  Anything OTC she can take or can something be called in?  Uses [facility] on [address], if something is to be prescribed.  Please advise  --No longer taking Oxycodone, and Gabapentin is making her sick.  ----Please advise 

## 2017-10-08 NOTE — Telephone Encounter (Signed)
Triage call: Patient is s/p TLIF 09/30/17.  She is complaining of having to run to the bathroom (loose stools and urinating) with anything she eats/drinks since yesterday.  No fever.  Anything OTC she can take or can something be called in?  Uses [facility] on [address], if something is to be prescribed.  Please advise.

## 2017-10-16 ENCOUNTER — Ambulatory Visit (INDEPENDENT_AMBULATORY_CARE_PROVIDER_SITE_OTHER): Payer: Medicare Other | Admitting: Surgery

## 2017-10-16 ENCOUNTER — Ambulatory Visit (INDEPENDENT_AMBULATORY_CARE_PROVIDER_SITE_OTHER): Payer: Medicare Other

## 2017-10-16 ENCOUNTER — Encounter (INDEPENDENT_AMBULATORY_CARE_PROVIDER_SITE_OTHER): Payer: Self-pay | Admitting: Surgery

## 2017-10-16 ENCOUNTER — Inpatient Hospital Stay: Payer: Medicare Other | Admitting: Nurse Practitioner

## 2017-10-16 VITALS — BP 129/84 | HR 64 | Ht 63.0 in | Wt 253.0 lb

## 2017-10-16 DIAGNOSIS — Z09 Encounter for follow-up examination after completed treatment for conditions other than malignant neoplasm: Secondary | ICD-10-CM

## 2017-10-16 DIAGNOSIS — Z981 Arthrodesis status: Secondary | ICD-10-CM

## 2017-10-16 NOTE — Progress Notes (Signed)
72 year old black female who is two-week status post multilevel fusion returns.  States that she is doing well.  Patient refused home health services.  Stated that she has had multiple back surgeries and she knows what to do.  States that she has been sitting and walking.  Getting around well with her walker.  Exam Very pleasant white female alert and oriented in no acute distress.  Today staples were removed and Steri-Strips applied.  Incision healing well without signs of infection.  No drainage.  Neurovascular intact.   Plan Patient continue wearing her brace.  She will avoid bending twisting lifting.  Follow-up with Dr. Otelia SergeantNitka in 2 weeks for recheck.  I will have Dr. Otelia SergeantNitka review x-rays done today.

## 2017-10-22 ENCOUNTER — Inpatient Hospital Stay: Payer: Medicare Other | Admitting: Nurse Practitioner

## 2017-10-27 ENCOUNTER — Telehealth (INDEPENDENT_AMBULATORY_CARE_PROVIDER_SITE_OTHER): Payer: Self-pay | Admitting: Specialist

## 2017-10-27 NOTE — Telephone Encounter (Signed)
Patient would like refill on hydrocodone, she does not want to take the oxycodone. CB # [phone]

## 2017-10-27 NOTE — Telephone Encounter (Signed)
Patient would like refill on hydrocodone, she does not want to take the oxycodone

## 2017-10-28 ENCOUNTER — Other Ambulatory Visit (INDEPENDENT_AMBULATORY_CARE_PROVIDER_SITE_OTHER): Payer: Self-pay | Admitting: Specialist

## 2017-10-28 MED ORDER — HYDROCODONE-ACETAMINOPHEN 5-325 MG PO TABS: 1.0000 | ORAL_TABLET | Freq: Four times a day (QID) | ORAL | 0 refills | Status: AC | PRN

## 2017-11-10 ENCOUNTER — Encounter (INDEPENDENT_AMBULATORY_CARE_PROVIDER_SITE_OTHER): Payer: Self-pay | Admitting: Specialist

## 2017-11-10 ENCOUNTER — Ambulatory Visit (INDEPENDENT_AMBULATORY_CARE_PROVIDER_SITE_OTHER): Payer: Medicare Other

## 2017-11-10 ENCOUNTER — Ambulatory Visit (INDEPENDENT_AMBULATORY_CARE_PROVIDER_SITE_OTHER): Payer: Medicare Other | Admitting: Specialist

## 2017-11-10 VITALS — BP 133/73 | HR 56 | Ht 63.0 in | Wt 253.0 lb

## 2017-11-10 DIAGNOSIS — Z981 Arthrodesis status: Secondary | ICD-10-CM

## 2017-11-10 NOTE — Patient Instructions (Signed)
Plan: Avoid frequent bending and stooping  No lifting greater than 10 lbs. May use ice or moist heat for pain. Weight loss is of benefit. Handicap license is approved. Continue to use brace when out of bed

## 2017-11-10 NOTE — Progress Notes (Addendum)
Post-Op Visit Note   Patient: [name]           Date of BirNicky Pughth: December 15, 1945           MRN: [id number] Visit Date: 11/10/2017 PCP: Evaristo BuryShambley, Ashleigh N, NP   Assessment & Plan:5 weeks post extension of lumbar fusion to the T10 level for proximal junctional stenosis and degenerative disc disease.   Chief Complaint:  Chief Complaint  Patient presents with  . Lower Back - Routine Post Op   Visit Diagnoses:  1. S/P lumbar spinal fusion     Plan: Avoid frequent bending and stooping  No lifting greater than 10 lbs. May use ice or moist heat for pain. Weight loss is of benefit. Handicap license is approved. Continue to use brace when out of bed    Follow-Up Instructions: No follow-ups on file.   Orders:  Orders Placed This Encounter  Procedures  . XR Lumbar Spine 2-3 Views   No orders of the defined types were placed in this encounter.   Imaging: No results found.  PMFS History: Patient Active Problem List   Diagnosis Date Noted  . Neurogenic claudication due to lumbar spinal stenosis 09/30/2017    Priority: High    Class: Chronic  . Spinal stenosis, lumbar region, with neurogenic claudication 01/23/2016    Priority: High    Class: Chronic  . Other secondary scoliosis, 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    Priority: High    Class: Chronic  . HNP (herniated nucleus pulposus), cervical 07/20/2012    Priority: High    Class: Acute  . Cervical spondylosis without myelopathy 07/20/2012    Priority: High    Class: Chronic  . Fusion of spine of thoracolumbar region 09/30/2017  . S/P partial thyroidectomy 08/23/2016  . Spondylolisthesis of 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  . Diastolic dysfunction   . Hypokalemia 03/08/2015  . Bradycardia 03/08/2015  . Syncope and collapse 03/02/2015  . Pancreatic cyst 02/28/2015  . Venous (peripheral) insufficiency 06/08/2010  . Asymptomatic varicose veins 06/07/2010  . HYPERLIPIDEMIA 04/16/2010  . Essential hypertension 04/16/2010  .  GERD 04/16/2010  . HIATAL HERNIA 04/16/2010  . OSTEOARTHRITIS 04/16/2010  . DYSPHAGIA UNSPECIFIED 04/16/2010  . Atrophic vaginitis 03/31/2009   Past Medical History:  Diagnosis Date  . Allergy   . Arthritis   . Cataract of right eye   . Diastolic dysfunction   . Dizzy   . GERD (gastroesophageal reflux disease)   . History of blood transfusion   . Hyperlipidemia   . Hypertension   . Pancreatic cyst 02/2015   Recommend follow-up MRI in 12 months.  . Pneumonia    "years ago"    Family History  Problem Relation Age of Onset  . Ovarian cancer Mother   . Heart disease Mother   . Prostate cancer Father   . Heart disease Father   . Colon cancer Father   . Breast cancer Sister   . Prostate cancer Brother   . Cystic fibrosis Sister   . Heart disease Sister   . Pancreatic cancer Neg Hx   . Rectal cancer Neg Hx   . Stomach cancer Neg Hx     Past Surgical History:  Procedure Laterality Date  . ABDOMINAL HYSTERECTOMY    . ANTERIOR CERVICAL DECOMP/DISCECTOMY FUSION N/A 07/20/2012   Procedure: ANTERIOR CERVICAL DISCECTOMY FUSION C5-6, C6-7 with transgraft(Alphatec), local bone graft, plate and screws ;  Surgeon: Kerrin ChampagneJames E Nitka, MD;  Location: [facility] OR;  Service: Orthopedics;  Laterality: N/A;  .  BACK SURGERY    . CERVICAL FUSION    . COLONOSCOPY    . ESOPHAGOGASTRODUODENOSCOPY    . EYE SURGERY Right    cataract   . REPLACEMENT TOTAL KNEE BILATERAL    . THYROIDECTOMY, PARTIAL    . TRANSFORAMINAL LUMBAR INTERBODY FUSION (TLIF) WITH PEDICLE SCREW FIXATION 2 LEVEL N/A 01/23/2016   Procedure: TRANSFORAMINAL LUMBAR INTERBODY FUSION (TLIF) WITH PEDICLE SCREW FIXATION 2 LEVEL WITH HARDWARE REMOVAL AND EXTENSION OF HARDWARE.;  Surgeon: Kerrin Champagne, MD;  Location: [facility] OR;  Service: Orthopedics;  Laterality: N/A;  L1-L3    Social History   Occupational History  . Occupation: Retired  Tobacco Use  . Smoking status: Never Smoker  . Smokeless tobacco: Never Used  Substance and Sexual  Activity  . Alcohol use: No    Alcohol/week: 0.0 oz  . Drug use: No  . Sexual activity: Not on file

## 2017-11-26 ENCOUNTER — Telehealth (INDEPENDENT_AMBULATORY_CARE_PROVIDER_SITE_OTHER): Payer: Self-pay | Admitting: Specialist

## 2017-11-26 NOTE — Telephone Encounter (Signed)
The number to contact patient is [phone]

## 2017-11-26 NOTE — Telephone Encounter (Signed)
Patient called advised she is sore on her right side. Patient said when she take the brace off she feel a little better. Patient asked for a call back concerning the brace she is wearing

## 2017-11-26 NOTE — Telephone Encounter (Signed)
Patient called advised she is sore on her right side. Patient said when she take the brace off she feel a little better. Patient asked for a call back concerning the brace she is wearing. The number to contact patient is [phone]

## 2017-11-28 ENCOUNTER — Telehealth (INDEPENDENT_AMBULATORY_CARE_PROVIDER_SITE_OTHER): Payer: Self-pay | Admitting: Specialist

## 2017-11-28 NOTE — Telephone Encounter (Signed)
Put her on cancellation list. °

## 2017-11-28 NOTE — Telephone Encounter (Signed)
Patient has an appointment on September the 18th.  She is needing a sooner appointment and wanted to be placed on the cancellation list.  Also she would like for you to call her.  CB#[phone].  Thank you.

## 2017-12-12 ENCOUNTER — Other Ambulatory Visit: Payer: Self-pay | Admitting: Family Medicine

## 2017-12-12 ENCOUNTER — Ambulatory Visit (INDEPENDENT_AMBULATORY_CARE_PROVIDER_SITE_OTHER): Payer: Medicare Other | Admitting: Specialist

## 2017-12-12 DIAGNOSIS — I10 Essential (primary) hypertension: Secondary | ICD-10-CM

## 2017-12-16 ENCOUNTER — Ambulatory Visit (INDEPENDENT_AMBULATORY_CARE_PROVIDER_SITE_OTHER): Payer: Medicare Other | Admitting: Specialist

## 2018-01-07 ENCOUNTER — Ambulatory Visit (INDEPENDENT_AMBULATORY_CARE_PROVIDER_SITE_OTHER): Payer: Medicare Other | Admitting: Physician Assistant

## 2018-01-07 ENCOUNTER — Encounter (INDEPENDENT_AMBULATORY_CARE_PROVIDER_SITE_OTHER): Payer: Self-pay | Admitting: Physician Assistant

## 2018-01-07 ENCOUNTER — Other Ambulatory Visit: Payer: Self-pay

## 2018-01-07 VITALS — BP 128/84 | HR 69 | Temp 97.8°F | Ht 63.0 in | Wt 244.8 lb

## 2018-01-07 DIAGNOSIS — R7989 Other specified abnormal findings of blood chemistry: Secondary | ICD-10-CM

## 2018-01-07 DIAGNOSIS — D649 Anemia, unspecified: Secondary | ICD-10-CM

## 2018-01-07 DIAGNOSIS — E039 Hypothyroidism, unspecified: Secondary | ICD-10-CM

## 2018-01-07 DIAGNOSIS — Z23 Encounter for immunization: Secondary | ICD-10-CM

## 2018-01-07 DIAGNOSIS — K219 Gastro-esophageal reflux disease without esophagitis: Secondary | ICD-10-CM

## 2018-01-07 DIAGNOSIS — I10 Essential (primary) hypertension: Secondary | ICD-10-CM

## 2018-01-07 DIAGNOSIS — R748 Abnormal levels of other serum enzymes: Secondary | ICD-10-CM

## 2018-01-07 DIAGNOSIS — E7841 Elevated Lipoprotein(a): Secondary | ICD-10-CM | POA: Diagnosis not present

## 2018-01-07 MED ORDER — ASPIRIN EC 81 MG PO TBEC: 81.0000 mg | DELAYED_RELEASE_TABLET | Freq: Every day | ORAL | 3 refills | Status: DC

## 2018-01-07 MED ORDER — AMLODIPINE BESYLATE 5 MG PO TABS: 5.0000 mg | ORAL_TABLET | Freq: Every day | ORAL | 3 refills | Status: DC

## 2018-01-07 MED ORDER — DEXLANSOPRAZOLE 30 MG PO CPDR: 30.0000 mg | DELAYED_RELEASE_CAPSULE | Freq: Every day | ORAL | 1 refills | Status: DC

## 2018-01-07 NOTE — Patient Instructions (Signed)
Food Choices for Gastroesophageal Reflux Disease, Adult When you have gastroesophageal reflux disease (GERD), the foods you eat and your eating habits are very important. Choosing the right foods can help ease your discomfort. What guidelines do I need to follow?  Choose fruits, vegetables, whole grains, and low-fat dairy products.  Choose low-fat meat, fish, and poultry.  Limit fats such as oils, salad dressings, butter, nuts, and avocado.  Keep a food diary. This helps you identify foods that cause symptoms.  Avoid foods that cause symptoms. These may be different for everyone.  Eat small meals often instead of 3 large meals a day.  Eat your meals slowly, in a place where you are relaxed.  Limit fried foods.  Cook foods using methods other than frying.  Avoid drinking alcohol.  Avoid drinking large amounts of liquids with your meals.  Avoid bending over or lying down until 2-3 hours after eating. What foods are not recommended? These are some foods and drinks that may make your symptoms worse: Vegetables  Tomatoes. Tomato juice. Tomato and spaghetti sauce. Chili peppers. Onion and garlic. Horseradish. Fruits  Oranges, grapefruit, and lemon (fruit and juice). Meats  High-fat meats, fish, and poultry. This includes hot dogs, ribs, ham, sausage, salami, and bacon. Dairy  Whole milk and chocolate milk. Sour cream. Cream. Butter. Ice cream. Cream cheese. Drinks  Coffee and tea. Bubbly (carbonated) drinks or energy drinks. Condiments  Hot sauce. Barbecue sauce. Sweets/Desserts  Chocolate and cocoa. Donuts. Peppermint and spearmint. Fats and Oils  High-fat foods. This includes French fries and potato chips. Other  Vinegar. Strong spices. This includes black pepper, white pepper, red pepper, cayenne, curry powder, cloves, ginger, and chili powder. The items listed above may not be a complete list of foods and drinks to avoid. Contact your dietitian for more information.    This information is not intended to replace advice given to you by your health care provider. Make sure you discuss any questions you have with your health care provider. Document Released: 10/15/2011 Document Revised: 09/21/2015 Document Reviewed: 02/17/2013 Elsevier Interactive Patient Education  2017 Elsevier Inc.  

## 2018-01-07 NOTE — Progress Notes (Signed)
Subjective:  Patient ID: [name], female    DOB: Nov 11, 1945  Age: 72 y.o. MRN: [id number]  CC: establish care  HPI [name] is a 72 y.o. female with a medical history of HTN, HLD, PNA, cataract of right eye, GERD, s/p lumbar spinal fusion, and pancreatic cyst present as a new patient to establish care. Says she is feeling well. Still in recovery after her spinal fusion three months ago. Has not needed her Gabapentin or amitriptyline for pain relief. Does not endorse any other symptoms or complaints.     MRI abdomen on 05/27/2016 revealed "2 tiny less than 1 cm cysts are seen in the pancreatic head and uncinate process, largest measuring 9 mm. These are stable in size. No evidence of main pancreatic ductal dilatation. No other pancreatic lesions identified". MRI w/wo recommend in 2 years. Had an MRI abdomen ordered on 06/06/17 by FNP Ria Clock but patient has not gotten done because she was told she should have MRI recheck only if she develops symptoms. No current abdominal pain but does complain of acid reflux at times. Has failed omeprazole, pantoprazole, and esomeprazole with her previous provider.       Outpatient Medications Prior to Visit  Medication Sig Dispense Refill  . amLODipine (NORVASC) 5 MG tablet Take 1 tablet (5 mg total) by mouth at bedtime. 90 tablet 0  . Multiple Vitamin (MULTIVITAMIN WITH MINERALS) TABS tablet Take 1 tablet by mouth daily.    . simvastatin (ZOCOR) 20 MG tablet TAKE 1 TABLET BY MOUTH  DAILY AT 6 PM. 90 tablet 0  . amitriptyline (ELAVIL) 10 MG tablet TAKE 1 TABLET(10 MG) BY MOUTH AT BEDTIME (Patient not taking: Reported on 01/07/2018) 90 tablet 1  . aspirin EC 81 MG tablet Take 81 mg by mouth daily.    [name] gabapentin (NEURONTIN) 300 MG capsule Take 1 capsule (300 mg total) by mouth 3 (three) times daily. (Patient not taking: Reported on 01/07/2018) 90 capsule 2  . methocarbamol (ROBAXIN) 500 MG tablet Take 1 tablet (500 mg total) by mouth every  8 (eight) hours as needed for muscle spasms. 40 tablet 0  . Omega-3 Fatty Acids (FISH OIL) 1000 MG CAPS Take 1 capsule by mouth every morning.    [name] omeprazole (PRILOSEC) 20 MG capsule Take 20 mg by mouth daily.     [name] omeprazole (PRILOSEC) 20 MG capsule TAKE 1 CAPSULE BY MOUTH  DAILY 90 capsule 0  . ranitidine (ZANTAC) 300 MG tablet TAKE 1 TABLET BY MOUTH AT  BEDTIME 90 tablet 0   No facility-administered medications prior to visit.      ROS Review of Systems  Constitutional: Negative for chills, fever and malaise/fatigue.  Eyes: Negative for blurred vision.  Respiratory: Negative for shortness of breath.   Cardiovascular: Negative for chest pain and palpitations.  Gastrointestinal: Negative for abdominal pain and nausea.  Genitourinary: Negative for dysuria and hematuria.  Musculoskeletal: Negative for joint pain and myalgias.  Skin: Negative for rash.  Neurological: Negative for tingling and headaches.  Psychiatric/Behavioral: Negative for depression. The patient is not nervous/anxious.     Objective:  BP 140/82 (BP Location: Left Arm, Patient Position: Sitting, Cuff Size: Large)   Pulse 68   Temp 97.8 F (36.6 C) (Oral)   Ht 5\' 3"  (1.6 m)   Wt 244 lb 12.8 oz (111 kg)   SpO2 93%   BMI 43.36 kg/m   BP/Weight 01/07/2018 11/10/2017 10/16/2017  Systolic BP 140 133 129  Diastolic BP  82 73 84  Wt. (Lbs) 244.8 253 253  BMI 43.36 44.82 44.82      Physical Exam  Constitutional: She is oriented to person, place, and time.  Well developed, obese, NAD, polite  HENT:  Head: Normocephalic and atraumatic.  Eyes: No scleral icterus.  Neck: Normal range of motion. Neck supple. No thyromegaly present.  Cardiovascular: Normal rate, regular rhythm and normal heart sounds.  No LE edema bilaterally  Pulmonary/Chest: Effort normal and breath sounds normal.  Abdominal: Soft. Bowel sounds are normal. She exhibits no distension. There is no tenderness. There is no guarding.   Musculoskeletal: She exhibits no edema.  Neurological: She is alert and oriented to person, place, and time.  Skin: Skin is warm and dry. No rash noted. No erythema. No pallor.  Psychiatric: She has a normal mood and affect. Her behavior is normal. Thought content normal.  Vitals reviewed.    Assessment & Plan:   1. Anemia, unspecified type - CBC with Differential; Future  2. Elevated serum creatinine - Comprehensive metabolic panel; Future  3. Abnormal liver enzymes - Comprehensive metabolic panel; Future  4. Elevated lipoprotein(a) - Lipid panel; Future  5. Gastroesophageal reflux disease, esophagitis presence not specified - Dexlansoprazole 30 MG capsule; Take 1 capsule (30 mg total) by mouth daily.  Dispense: 30 capsule; Refill: 1  6. Hypothyroidism, unspecified type - Thyroid Panel With TSH; Future  7. Need for prophylactic vaccination and inoculation against influenza - Flu Vaccine QUAD 6+ mos PF IM (Fluarix Quad PF)   Meds ordered this encounter  Medications  . Dexlansoprazole 30 MG capsule    Sig: Take 1 capsule (30 mg total) by mouth daily.    Dispense:  30 capsule    Refill:  1    Order Specific Question:   Supervising Provider    Answer:   Hoy Register [[id number]]    Follow-up:  4 weeks for GERD.  Loletta Specter PA

## 2018-01-14 ENCOUNTER — Ambulatory Visit (INDEPENDENT_AMBULATORY_CARE_PROVIDER_SITE_OTHER): Payer: Medicare Other | Admitting: Specialist

## 2018-01-19 ENCOUNTER — Other Ambulatory Visit: Payer: Self-pay | Admitting: Family Medicine

## 2018-01-19 ENCOUNTER — Other Ambulatory Visit (INDEPENDENT_AMBULATORY_CARE_PROVIDER_SITE_OTHER): Payer: Self-pay | Admitting: Specialist

## 2018-01-19 DIAGNOSIS — E782 Mixed hyperlipidemia: Secondary | ICD-10-CM

## 2018-01-19 NOTE — Telephone Encounter (Signed)
Naproxen refill request 

## 2018-01-20 ENCOUNTER — Other Ambulatory Visit (INDEPENDENT_AMBULATORY_CARE_PROVIDER_SITE_OTHER): Payer: Medicare Other

## 2018-01-20 DIAGNOSIS — D649 Anemia, unspecified: Secondary | ICD-10-CM

## 2018-01-20 DIAGNOSIS — R7989 Other specified abnormal findings of blood chemistry: Secondary | ICD-10-CM

## 2018-01-20 DIAGNOSIS — E7841 Elevated Lipoprotein(a): Secondary | ICD-10-CM

## 2018-01-20 DIAGNOSIS — R748 Abnormal levels of other serum enzymes: Secondary | ICD-10-CM

## 2018-01-20 DIAGNOSIS — E039 Hypothyroidism, unspecified: Secondary | ICD-10-CM

## 2018-01-20 NOTE — Progress Notes (Signed)
Labs collected by onsite [facility] phlebotomist. Yareth Macdonnell S Kadince Boxley, CMA  

## 2018-01-21 ENCOUNTER — Other Ambulatory Visit (INDEPENDENT_AMBULATORY_CARE_PROVIDER_SITE_OTHER): Payer: Self-pay | Admitting: Physician Assistant

## 2018-01-21 DIAGNOSIS — E7841 Elevated Lipoprotein(a): Secondary | ICD-10-CM

## 2018-01-21 LAB — COMPREHENSIVE METABOLIC PANEL
ALK PHOS: 82 IU/L (ref 39–117)
ALT: 4 IU/L (ref 0–32)
AST: 15 IU/L (ref 0–40)
Albumin/Globulin Ratio: 1.4 (ref 1.2–2.2)
Albumin: 4.1 g/dL (ref 3.5–4.8)
BUN/Creatinine Ratio: 14 (ref 12–28)
BUN: 16 mg/dL (ref 8–27)
Bilirubin Total: 0.4 mg/dL (ref 0.0–1.2)
CALCIUM: 9.8 mg/dL (ref 8.7–10.3)
CO2: 23 mmol/L (ref 20–29)
CREATININE: 1.12 mg/dL — AB (ref 0.57–1.00)
Chloride: 100 mmol/L (ref 96–106)
GFR calc Af Amer: 57 mL/min/{1.73_m2} — ABNORMAL LOW (ref >59)
GFR calc non Af Amer: 50 mL/min/{1.73_m2} — ABNORMAL LOW (ref >59)
GLOBULIN, TOTAL: 2.9 g/dL (ref 1.5–4.5)
GLUCOSE: 96 mg/dL (ref 65–99)
Potassium: 4.4 mmol/L (ref 3.5–5.2)
SODIUM: 139 mmol/L (ref 134–144)
Total Protein: 7 g/dL (ref 6.0–8.5)

## 2018-01-21 LAB — CBC WITH DIFFERENTIAL/PLATELET
BASOS: 1 %
Basophils Absolute: 0 10*3/uL (ref 0.0–0.2)
EOS (ABSOLUTE): 0.3 10*3/uL (ref 0.0–0.4)
EOS: 8 %
HEMATOCRIT: 38.1 % (ref 34.0–46.6)
HEMOGLOBIN: 12.5 g/dL (ref 11.1–15.9)
Immature Grans (Abs): 0 10*3/uL (ref 0.0–0.1)
Immature Granulocytes: 0 %
Lymphocytes Absolute: 1.9 10*3/uL (ref 0.7–3.1)
Lymphs: 47 %
MCH: 24.6 pg — AB (ref 26.6–33.0)
MCHC: 32.8 g/dL (ref 31.5–35.7)
MCV: 75 fL — AB (ref 79–97)
Monocytes Absolute: 0.3 10*3/uL (ref 0.1–0.9)
Monocytes: 8 %
NEUTROS ABS: 1.4 10*3/uL (ref 1.4–7.0)
Neutrophils: 36 %
Platelets: 246 10*3/uL (ref 150–450)
RBC: 5.08 x10E6/uL (ref 3.77–5.28)
RDW: 16.3 % — ABNORMAL HIGH (ref 12.3–15.4)
WBC: 3.9 10*3/uL (ref 3.4–10.8)

## 2018-01-21 LAB — THYROID PANEL WITH TSH
FREE THYROXINE INDEX: 2.4 (ref 1.2–4.9)
T3 Uptake Ratio: 24 % (ref 24–39)
T4, Total: 10.2 ug/dL (ref 4.5–12.0)
TSH: 3.22 u[IU]/mL (ref 0.450–4.500)

## 2018-01-21 LAB — LIPID PANEL
CHOLESTEROL TOTAL: 194 mg/dL (ref 100–199)
Chol/HDL Ratio: 4.1 ratio (ref 0.0–4.4)
HDL: 47 mg/dL (ref >39)
LDL CALC: 113 mg/dL — AB (ref 0–99)
TRIGLYCERIDES: 169 mg/dL — AB (ref 0–149)
VLDL Cholesterol Cal: 34 mg/dL (ref 5–40)

## 2018-01-21 MED ORDER — EZETIMIBE 10 MG PO TABS: 10.0000 mg | ORAL_TABLET | Freq: Every day | ORAL | 1 refills | Status: DC

## 2018-01-23 ENCOUNTER — Telehealth (INDEPENDENT_AMBULATORY_CARE_PROVIDER_SITE_OTHER): Payer: Self-pay

## 2018-01-23 NOTE — Telephone Encounter (Signed)
Left voicemail notifying patient that thyroid is normal, cholesterol elevated; ezetimibe 10 mg has been sent to her mail pharmacy. Call [facility] with any questions or concerns. Maryjean Morn, CMA

## 2018-01-23 NOTE — Telephone Encounter (Signed)
-----   Message from Loletta Specter, PA-C sent at 01/21/2018  6:23 PM EDT ----- Thyroid normal. Cholesterol elevated. I will send ezetimibe 10 mg to her mail pharmacy service.

## 2018-01-29 ENCOUNTER — Telehealth (INDEPENDENT_AMBULATORY_CARE_PROVIDER_SITE_OTHER): Payer: Self-pay | Admitting: Physician Assistant

## 2018-01-29 NOTE — Telephone Encounter (Signed)
I would recommend she be evaluated at the Urgent care just in case she has a bacterial infection.

## 2018-01-29 NOTE — Telephone Encounter (Signed)
FWD to PCP. Tempestt S Roberts, CMA  

## 2018-01-29 NOTE — Telephone Encounter (Signed)
Patient called to inform that when she blows her nose she has yellowish mucus and sometimes has to clear her trout and the same yellowish mucus come out. Patient states she is starting to hurt on her right side ear. Patient states that she has taken benadryl and wanted to know what else she could take over the counter. Patient has an appointment  Oct 9 @ 10:10 am.  Please Advice [phone]  Thank you Louisa Second

## 2018-01-30 NOTE — Telephone Encounter (Signed)
Left message notifying patient that per PCP she should be evaluated at urgent care in case she has a bacterial infection. Call [facility] with any questions or concerns. Maryjean Morn, CMA

## 2018-02-04 ENCOUNTER — Ambulatory Visit (INDEPENDENT_AMBULATORY_CARE_PROVIDER_SITE_OTHER): Payer: Medicare Other | Admitting: Physician Assistant

## 2018-02-27 ENCOUNTER — Other Ambulatory Visit: Payer: Self-pay | Admitting: Family Medicine

## 2018-02-27 DIAGNOSIS — K219 Gastro-esophageal reflux disease without esophagitis: Secondary | ICD-10-CM

## 2018-03-02 ENCOUNTER — Other Ambulatory Visit: Payer: Self-pay

## 2018-03-02 ENCOUNTER — Ambulatory Visit (INDEPENDENT_AMBULATORY_CARE_PROVIDER_SITE_OTHER): Payer: Medicare Other | Admitting: Physician Assistant

## 2018-03-02 ENCOUNTER — Encounter (INDEPENDENT_AMBULATORY_CARE_PROVIDER_SITE_OTHER): Payer: Self-pay | Admitting: Physician Assistant

## 2018-03-02 VITALS — BP 151/97 | HR 64 | Temp 97.5°F | Ht 63.0 in | Wt 248.0 lb

## 2018-03-02 DIAGNOSIS — Z1159 Encounter for screening for other viral diseases: Secondary | ICD-10-CM | POA: Diagnosis not present

## 2018-03-02 DIAGNOSIS — E2839 Other primary ovarian failure: Secondary | ICD-10-CM | POA: Diagnosis not present

## 2018-03-02 DIAGNOSIS — K219 Gastro-esophageal reflux disease without esophagitis: Secondary | ICD-10-CM

## 2018-03-02 NOTE — Progress Notes (Signed)
Subjective:  Patient ID: [name], female    DOB: 07-04-1945  Age: 72 y.o. MRN: [id number]  CC: f/u GERD  HPI [name] is a 72 y.o. female with a medical history of HTN, HLD, PNA, cataract of right eye, GERD, s/p lumbar spinal fusion, and pancreatic cyst presents to f/u on GERD. Pt was prescribed Dexilant because she failed omeprazole, pantoprazole, and esomeprazole with her previous provider. Could not afford the $200 copay for Dexilant and instead is taking Nexium OTC which has been helpful in reducing acid reflux. Also not consuming foods which have previously triggered her acid reflux. Says she has no epigastric pain or acid reflux with Nexium. Has an upcoming appointment with  GI on 03/18/18 and will be discussing her pancreatic cysts and GERD.     BP is noted to be elevated today compared to good control last month. No medication adjustments were made. Pt states she forgot to take her amlodipine last night. Feels well. Denies CP, palpitations, SOB, HA, tingling, numbness, abdominal pain, f/c/n/v, rash, LE edema, or GI/GU sxs.        Outpatient Medications Prior to Visit  Medication Sig Dispense Refill  . amLODipine (NORVASC) 5 MG tablet Take 1 tablet (5 mg total) by mouth at bedtime. 90 tablet 3  . aspirin EC 81 MG tablet Take 1 tablet (81 mg total) by mouth daily. 90 tablet 3  . ezetimibe (ZETIA) 10 MG tablet Take 1 tablet (10 mg total) by mouth daily. 90 tablet 1  . Dexlansoprazole 30 MG capsule Take 1 capsule (30 mg total) by mouth daily. (Patient not taking: Reported on 03/02/2018) 30 capsule 1  . Multiple Vitamin (MULTIVITAMIN WITH MINERALS) TABS tablet Take 1 tablet by mouth daily.    . naproxen (NAPROSYN) 500 MG tablet TAKE 1 TABLET BY MOUTH TWO  TIMES DAILY WITH MEALS (Patient not taking: Reported on 03/02/2018) 180 tablet 1   No facility-administered medications prior to visit.      ROS Review of Systems  Constitutional: Negative for chills, fever  and malaise/fatigue.  Eyes: Negative for blurred vision.  Respiratory: Negative for shortness of breath.   Cardiovascular: Negative for chest pain and palpitations.  Gastrointestinal: Negative for abdominal pain and nausea.  Genitourinary: Negative for dysuria and hematuria.  Musculoskeletal: Negative for joint pain and myalgias.  Skin: Negative for rash.  Neurological: Negative for tingling and headaches.  Psychiatric/Behavioral: Negative for depression. The patient is not nervous/anxious.     Objective:  BP (!) 151/97 (BP Location: Left Arm, Patient Position: Sitting, Cuff Size: Large)   Pulse 64   Temp (!) 97.5 F (36.4 C) (Oral)   Ht 5\' 3"  (1.6 m)   Wt 248 lb (112.5 kg)   SpO2 99%   BMI 43.93 kg/m   BP/Weight 03/02/2018 01/07/2018 11/10/2017  Systolic BP 151 128 133  Diastolic BP 97 84 73  Wt. (Lbs) 248 244.8 253  BMI 43.93 43.36 44.82      Physical Exam  Constitutional: She is oriented to person, place, and time.  Well developed, well nourished, NAD, polite  HENT:  Head: Normocephalic and atraumatic.  Eyes: No scleral icterus.  Neck: Normal range of motion. Neck supple. No thyromegaly present.  Cardiovascular: Normal rate, regular rhythm and normal heart sounds.  Pulmonary/Chest: Effort normal and breath sounds normal.  Abdominal: Soft. Bowel sounds are normal. There is no tenderness.  Musculoskeletal: She exhibits no edema.  Neurological: She is alert and oriented to person, place, and time.  Skin: Skin is warm and dry. No rash noted. No erythema. No pallor.  Psychiatric: She has a normal mood and affect. Her behavior is normal. Thought content normal.  Vitals reviewed.    Assessment & Plan:     1. Gastroesophageal reflux disease, esophagitis presence not specified - Pt seems to be doing well on OTC Nexium. Interesting to note she reported failure on esomeprazole. I have advised to keep appointment with Dr. Christella Hartigan at [facility].   2. Need for hepatitis C  screening test - Hepatitis c antibody (reflex)  3. Estrogen deficiency - DG Bone Density; Future    Follow-up: Return in about 4 months (around 07/01/2018) for HTN.   Loletta Specter PA

## 2018-03-02 NOTE — Patient Instructions (Signed)
Bone Densitometry Bone densitometry is an imaging test that uses a special X-ray to measure the amount of calcium and other minerals in your bones (bone density). This test is also known as a bone mineral density test or dual-energy X-ray absorptiometry (DXA). The test can measure bone density at your hip and your spine. It is similar to having a regular X-ray. You may have this test to:  Diagnose a condition that causes weak or thin bones (osteoporosis).  Predict your risk of a broken bone (fracture).  Determine how well osteoporosis treatment is working.  Tell a health care provider about:  Any allergies you have.  All medicines you are taking, including vitamins, herbs, eye drops, creams, and over-the-counter medicines.  Any problems you or family members have had with anesthetic medicines.  Any blood disorders you have.  Any surgeries you have had.  Any medical conditions you have.  Possibility of pregnancy.  Any other medical test you had within the previous 14 days that used contrast material. What are the risks? Generally, this is a safe procedure. However, problems can occur and may include the following:  This test exposes you to a very small amount of radiation.  The risks of radiation exposure may be greater to unborn children.  What happens before the procedure?  Do not take any calcium supplements for 24 hours before having the test. You can otherwise eat and drink what you usually do.  Take off all metal jewelry, eyeglasses, dental appliances, and any other metal objects. What happens during the procedure?  You may lie on an exam table. There will be an X-ray generator below you and an imaging device above you.  Other devices, such as boxes or braces, may be used to position your body properly for the scan.  You will need to lie still while the machine slowly scans your body.  The images will show up on a computer monitor. What happens after the  procedure? You may need more testing at a later time. This information is not intended to replace advice given to you by your health care provider. Make sure you discuss any questions you have with your health care provider. Document Released: 05/07/2004 Document Revised: 09/21/2015 Document Reviewed: 09/23/2013 Elsevier Interactive Patient Education  2018 Elsevier Inc.   

## 2018-03-03 ENCOUNTER — Telehealth (INDEPENDENT_AMBULATORY_CARE_PROVIDER_SITE_OTHER): Payer: Self-pay

## 2018-03-03 LAB — HEPATITIS C ANTIBODY (REFLEX)

## 2018-03-03 LAB — HCV COMMENT:

## 2018-03-03 NOTE — Telephone Encounter (Signed)
Left voicemail notifying patient that HCV was negative, and to call [facility] with any questions or concerns. Maryjean Morn, CMA

## 2018-03-03 NOTE — Telephone Encounter (Signed)
-----   Message from Loletta Specter, PA-C sent at 03/03/2018  1:44 PM EST ----- HCV negative.

## 2018-03-04 ENCOUNTER — Other Ambulatory Visit (INDEPENDENT_AMBULATORY_CARE_PROVIDER_SITE_OTHER): Payer: Self-pay | Admitting: Physician Assistant

## 2018-03-04 DIAGNOSIS — Z1231 Encounter for screening mammogram for malignant neoplasm of breast: Secondary | ICD-10-CM

## 2018-03-09 ENCOUNTER — Telehealth (INDEPENDENT_AMBULATORY_CARE_PROVIDER_SITE_OTHER): Payer: Self-pay | Admitting: Physician Assistant

## 2018-03-09 NOTE — Telephone Encounter (Signed)
Patient called want to know if PCP could prescribe omeprazole (PRILOSEC) 20 MG capsule  For acid reflux.   Patient uses [facility] [phone].  Patient states that PCP would have to call in order for them to deliver to her house.  Please advice [phone]  Thank you [name]

## 2018-03-09 NOTE — Telephone Encounter (Signed)
FWD to PCP. [name] S Glorianne Proctor, CMA  

## 2018-03-11 ENCOUNTER — Other Ambulatory Visit (INDEPENDENT_AMBULATORY_CARE_PROVIDER_SITE_OTHER): Payer: Self-pay | Admitting: Physician Assistant

## 2018-03-11 DIAGNOSIS — K219 Gastro-esophageal reflux disease without esophagitis: Secondary | ICD-10-CM

## 2018-03-11 MED ORDER — OMEPRAZOLE 20 MG PO CPDR: 20.0000 mg | DELAYED_RELEASE_CAPSULE | Freq: Every day | ORAL | 3 refills | Status: DC

## 2018-03-11 NOTE — Telephone Encounter (Signed)
I have sent to [facility] by [facility].

## 2018-03-18 ENCOUNTER — Ambulatory Visit: Payer: Medicare Other | Admitting: Gastroenterology

## 2018-04-01 ENCOUNTER — Ambulatory Visit (INDEPENDENT_AMBULATORY_CARE_PROVIDER_SITE_OTHER): Payer: Medicare Other | Admitting: Specialist

## 2018-04-01 ENCOUNTER — Other Ambulatory Visit: Payer: Medicare Other

## 2018-05-13 ENCOUNTER — Ambulatory Visit (INDEPENDENT_AMBULATORY_CARE_PROVIDER_SITE_OTHER): Payer: Medicare Other | Admitting: Specialist

## 2018-05-19 ENCOUNTER — Ambulatory Visit (INDEPENDENT_AMBULATORY_CARE_PROVIDER_SITE_OTHER): Payer: Medicare Other | Admitting: Family Medicine

## 2018-05-19 ENCOUNTER — Other Ambulatory Visit: Payer: Self-pay | Admitting: Family Medicine

## 2018-05-19 ENCOUNTER — Other Ambulatory Visit: Payer: Self-pay

## 2018-05-19 ENCOUNTER — Encounter (INDEPENDENT_AMBULATORY_CARE_PROVIDER_SITE_OTHER): Payer: Self-pay | Admitting: Family Medicine

## 2018-05-19 VITALS — BP 140/81 | HR 71 | Temp 97.4°F | Ht 63.0 in | Wt 256.4 lb

## 2018-05-19 DIAGNOSIS — J069 Acute upper respiratory infection, unspecified: Secondary | ICD-10-CM | POA: Diagnosis not present

## 2018-05-19 DIAGNOSIS — E785 Hyperlipidemia, unspecified: Secondary | ICD-10-CM | POA: Diagnosis not present

## 2018-05-19 DIAGNOSIS — I10 Essential (primary) hypertension: Secondary | ICD-10-CM | POA: Diagnosis not present

## 2018-05-19 DIAGNOSIS — K219 Gastro-esophageal reflux disease without esophagitis: Secondary | ICD-10-CM

## 2018-05-19 MED ORDER — OMEPRAZOLE 20 MG PO CPDR: 20.0000 mg | DELAYED_RELEASE_CAPSULE | Freq: Every day | ORAL | 1 refills | Status: DC

## 2018-05-19 MED ORDER — EZETIMIBE 10 MG PO TABS: 10.0000 mg | ORAL_TABLET | Freq: Every day | ORAL | 1 refills | Status: DC

## 2018-05-19 NOTE — Progress Notes (Signed)
Patient ID: [name], female   DOB: [birth year], 73 y.o.   MRN: [id number]   Patient called back after her office visit requesting refill of omeprazole 20 mg and also requesting refill of ranitidine.  Ranitidine is not on patient's current medication list.  Medication has actually been under recent recall therefore only the omeprazole will be refilled at this time.

## 2018-05-19 NOTE — Progress Notes (Signed)
Subjective:    Patient ID: [name], female    DOB: 06-Jan-1946, 73 y.o.   MRN: [id number]  HPI       73 year old female who presents secondary to complaint of more than 1 week of cough that was occasionally productive of yellow sputum but now clear patient also with nasal congestion and patient also had some recurrent dizziness.  Patient reports that she started taking over-the-counter Mucinex as well as home remedies and her nasal congestion as well as cough and dizziness have improved.  Patient feels that she is actually getting over most of her symptoms.  Patient feels that her blood pressure has been controlled on her current medications however patient states that in the past she was on a diuretic blood pressure medication and patient states that she feels that she needs to be on a diuretic.  Patient also needs refill of Zetia for her hyperlipidemia.  Patient also requested naproxen refill for which she had the medication bottle.  Patient reports that she has issues with multiple joint pain and has had several spinal surgeries in the past including fusion surgery to the cervical spine and patient states that she therefore takes naproxen as needed for pain.  Patient additionally states that she has started taking over-the-counter Nexium instead of the prescribed omeprazole as she wants to get off of some of her medications.  Patient denies any abdominal pain associated with her use of naproxen.  Patient reports no fever or chills, no earache or sore throat, no abdominal pain, no nausea or vomiting.  Past Medical History:  Diagnosis Date  . Allergy   . Arthritis   . Cataract of right eye   . Diastolic dysfunction   . Dizzy   . GERD (gastroesophageal reflux disease)   . History of blood transfusion   . Hyperlipidemia   . Hypertension   . Pancreatic cyst 02/2015   Recommend follow-up MRI in 12 months.  . Pneumonia    "years ago"   Past Surgical History:  Procedure Laterality Date    . ABDOMINAL HYSTERECTOMY    . ANTERIOR CERVICAL DECOMP/DISCECTOMY FUSION N/A 07/20/2012   Procedure: ANTERIOR CERVICAL DISCECTOMY FUSION C5-6, C6-7 with transgraft(Alphatec), local bone graft, plate and screws ;  Surgeon: Kerrin ChampagneJames E Nitka, MD;  Location: [facility];  Service: Orthopedics;  Laterality: N/A;  . BACK SURGERY    . CERVICAL FUSION    . COLONOSCOPY    . ESOPHAGOGASTRODUODENOSCOPY    . EYE SURGERY Right    cataract   . REPLACEMENT TOTAL KNEE BILATERAL    . THYROIDECTOMY, PARTIAL    . TRANSFORAMINAL LUMBAR INTERBODY FUSION (TLIF) WITH PEDICLE SCREW FIXATION 2 LEVEL N/A 01/23/2016   Procedure: TRANSFORAMINAL LUMBAR INTERBODY FUSION (TLIF) WITH PEDICLE SCREW FIXATION 2 LEVEL WITH HARDWARE REMOVAL AND EXTENSION OF HARDWARE.;  Surgeon: Kerrin ChampagneJames E Nitka, MD;  Location: [facility] OR;  Service: Orthopedics;  Laterality: N/A;  L1-L3    Family History  Problem Relation Age of Onset  . Ovarian cancer Mother   . Heart disease Mother   . Prostate cancer Father   . Heart disease Father   . Colon cancer Father   . Breast cancer Sister   . Prostate cancer Brother   . Cystic fibrosis Sister   . Heart disease Sister   . Pancreatic cancer Neg Hx   . Rectal cancer Neg Hx   . Stomach cancer Neg Hx    Social History   Tobacco Use  . Smoking status: Never  Smoker  . Smokeless tobacco: Never Used  Substance Use Topics  . Alcohol use: No    Alcohol/week: 0.0 standard drinks  . Drug use: No   Allergies  Allergen Reactions  . Lisinopril Swelling    Swelling in mouth and throat  . Penicillins Rash    Has patient had a PCN reaction causing immediate rash, facial/tongue/throat swelling, SOB or lightheadedness with hypotension: No Has patient had a PCN reaction causing severe rash involving mucus membranes or skin necrosis: No Has patient had a PCN reaction that required hospitalization No Has patient had a PCN reaction occurring within the last 10 years: No If all of the above answers are "NO", then may  proceed with Cephalosporin use.   . Tramadol Hcl Rash  . Tramadol Hcl Rash   Current Outpatient Medications on File Prior to Visit  Medication Sig Dispense Refill  . amLODipine (NORVASC) 5 MG tablet Take 1 tablet (5 mg total) by mouth at bedtime. 90 tablet 3  . aspirin EC 81 MG tablet Take 1 tablet (81 mg total) by mouth daily. 90 tablet 3  . omeprazole (PRILOSEC) 20 MG capsule Take 1 capsule (20 mg total) by mouth daily. 30 capsule 3  . Multiple Vitamin (MULTIVITAMIN WITH MINERALS) TABS tablet Take 1 tablet by mouth daily.    . [DISCONTINUED] simvastatin (ZOCOR) 20 MG tablet TAKE 1 TABLET BY MOUTH  DAILY AT 6 PM. 90 tablet 0   No current facility-administered medications on file prior to visit.       Review of Systems  Constitutional: Positive for fatigue. Negative for chills and fever.  HENT: Positive for congestion. Negative for postnasal drip, rhinorrhea, sinus pressure (resolved), sinus pain, sneezing, sore throat and trouble swallowing.   Respiratory: Negative for cough and shortness of breath.   Cardiovascular: Negative for chest pain and palpitations.  Gastrointestinal: Negative for abdominal pain and nausea.  Genitourinary: Negative for dysuria and flank pain.  Musculoskeletal: Positive for arthralgias and back pain.  Neurological: Positive for dizziness (improving). Negative for headaches.       Objective:   Physical Exam BP 140/81 (BP Location: Left Arm, Patient Position: Sitting, Cuff Size: Large)   Pulse 71   Temp (!) 97.4 F (36.3 C) (Oral)   Ht 5\' 3"  (1.6 m)   Wt 256 lb 6.4 oz (116.3 kg)   SpO2 96%   BMI 45.42 kg/m Nurse's notes and vital signs reviewed General-well-nourished, well-developed, overweight/obese older female in no acute distress ENT- TMs dull bilaterally, nares with moderate edema of the nasal mucosa, no active nasal discharge, patient with mild posterior pharynx erythema/edema Neck-no lymphadenopathy, patient with some mild decreased range of  motion and posterior neck/cervical paraspinous spasm status post prior cervical fusion Lungs-clear to auscultation bilaterally Cardiovascular-regular rate and rhythm Abdomen-soft, nontender Extremities- very mild bilateral, nonpitting distal lower extremity edema      Assessment & Plan:  1. URI with cough and congestion Discussed with the patient that she may continue the use of Mucinex or obtain generic over-the-counter Robitussin-DM which also contains the same ingredient as Mucinex for chest congestion.  Patient may take nighttime Benadryl if needed for postnasal drainage or take over-the-counter Claritin or Zyrtec which will help with nasal congestion without elevating her blood pressure.  Patient should call or return if her symptoms worsen or if her cough again becomes productive.  Patient reports that cough is now minimal and nonproductive and patient denies any nasal discharge at this time.  2. Essential hypertension Patient requested  diuretic medication for the treatment of her hypertension.  Patient is currently on amlodipine 5 mg daily.  Patient does not have any significant lower extremity edema.  On review of patient's labs, patient's creatinine was slightly elevated at 1.21.  I discussed with the patient that her prior diuretic may have been stopped secondary to renal insufficiency and at this time it does not appear that she needs a diuretic medication.  Patient declined BMP at today's visit in follow-up of renal insufficiency.  Patient had also requested refill of naproxen and I discussed with the patient that the naproxen use may also contribute to renal insufficiency/hypertension.  Also, naproxen was not prescribed by a provider here at this clinic but appears to have been from Dr. Otelia Sergeant patient's orthopedic doctor whom she should contact regarding any future refills.  Patient was also encouraged to continue the use of her omeprazole especially in light of her use of naproxen as  naproxen can cause increased risk for gastritis/GI bleeding.  3. Hyperlipidemia LDL goal <70 Patient requested refill of Zetia at today's visit.  Prescription was provided for refill of this medication.  Patient has had history of elevated liver enzymes and therefore is not on statin medication.  Patient with last lipid panel done 01/20/2018 showing triglycerides slightly elevated at 169 and LDL at 113.  Patient is encouraged to continue low-fat diet. - ezetimibe (ZETIA) 10 MG tablet; Take 1 tablet (10 mg total) by mouth daily.  Dispense: 90 tablet; Refill: 1  An After Visit Summary was printed and given to the patient.  Allergies as of 05/19/2018      Reactions   Lisinopril Swelling   Swelling in mouth and throat   Penicillins Rash   Has patient had a PCN reaction causing immediate rash, facial/tongue/throat swelling, SOB or lightheadedness with hypotension: No Has patient had a PCN reaction causing severe rash involving mucus membranes or skin necrosis: No Has patient had a PCN reaction that required hospitalization No Has patient had a PCN reaction occurring within the last 10 years: No If all of the above answers are "NO", then may proceed with Cephalosporin use.   Tramadol Hcl Rash   Tramadol Hcl Rash      Medication List       Accurate as of May 19, 2018 12:16 PM. Always use your most recent med list.        amLODipine 5 MG tablet Commonly known as:  NORVASC Take 1 tablet (5 mg total) by mouth at bedtime.   aspirin EC 81 MG tablet Take 1 tablet (81 mg total) by mouth daily.   ezetimibe 10 MG tablet Commonly known as:  ZETIA Take 1 tablet (10 mg total) by mouth daily.   multivitamin with minerals Tabs tablet Take 1 tablet by mouth daily.   omeprazole 20 MG capsule Commonly known as:  PRILOSEC Take 1 capsule (20 mg total) by mouth daily.      Return if symptoms worsen or fail to improve, for  HTN-keep scheduled appt  or 3 months with PCP.

## 2018-05-21 ENCOUNTER — Ambulatory Visit: Payer: Medicare Other

## 2018-05-28 ENCOUNTER — Telehealth (INDEPENDENT_AMBULATORY_CARE_PROVIDER_SITE_OTHER): Payer: Self-pay

## 2018-05-28 NOTE — Telephone Encounter (Signed)
Patient called to check on the status of her prescription for zantac. She was very upset that the prescription had not been sent to her pharmacy. After speaking with covering provider I informed patient that zantac was on recall and we would send an alternative. Covering provider reviewed patient medication list to see that she was already prescribed omeprazole. There would not be an alternative to send. Informed patient of that information. She understood that but stated that she would not be returning to our clinic as a patient. She has already scheduled to be seen by a new provider at another facility. Maryjean Morn, CMA

## 2018-05-28 NOTE — Telephone Encounter (Signed)
Patient called to check the status of

## 2018-06-04 ENCOUNTER — Ambulatory Visit: Payer: Medicare Other

## 2018-06-04 ENCOUNTER — Other Ambulatory Visit: Payer: Medicare Other

## 2018-06-17 ENCOUNTER — Ambulatory Visit (INDEPENDENT_AMBULATORY_CARE_PROVIDER_SITE_OTHER): Payer: Medicare Other | Admitting: Specialist

## 2018-07-03 ENCOUNTER — Ambulatory Visit (INDEPENDENT_AMBULATORY_CARE_PROVIDER_SITE_OTHER): Payer: Medicare Other | Admitting: Specialist

## 2018-07-06 ENCOUNTER — Ambulatory Visit (INDEPENDENT_AMBULATORY_CARE_PROVIDER_SITE_OTHER): Payer: Medicare Other | Admitting: Primary Care

## 2018-07-28 ENCOUNTER — Ambulatory Visit: Payer: Medicare Other

## 2018-07-28 ENCOUNTER — Other Ambulatory Visit: Payer: Medicare Other

## 2018-08-06 ENCOUNTER — Telehealth (INDEPENDENT_AMBULATORY_CARE_PROVIDER_SITE_OTHER): Payer: Self-pay | Admitting: Radiology

## 2018-08-06 NOTE — Telephone Encounter (Signed)
Called and spoke with patient, patient answered NO to all pre screening questions for appointment on 4/13 

## 2018-08-10 ENCOUNTER — Ambulatory Visit (INDEPENDENT_AMBULATORY_CARE_PROVIDER_SITE_OTHER): Payer: Medicare Other | Admitting: Specialist

## 2018-09-04 ENCOUNTER — Ambulatory Visit: Payer: Self-pay | Admitting: Specialist

## 2018-09-16 ENCOUNTER — Other Ambulatory Visit: Payer: Medicare Other

## 2018-09-17 ENCOUNTER — Ambulatory Visit: Payer: Medicare Other | Admitting: Specialist

## 2018-09-23 ENCOUNTER — Ambulatory Visit: Payer: Medicare Other

## 2018-09-30 ENCOUNTER — Ambulatory Visit: Payer: Medicare Other

## 2018-09-30 ENCOUNTER — Other Ambulatory Visit: Payer: Medicare Other

## 2018-11-19 ENCOUNTER — Other Ambulatory Visit: Payer: Self-pay | Admitting: Specialist

## 2018-11-19 ENCOUNTER — Other Ambulatory Visit: Payer: Self-pay | Admitting: Nurse Practitioner

## 2018-11-19 ENCOUNTER — Other Ambulatory Visit: Payer: Self-pay | Admitting: Physician Assistant

## 2018-11-19 DIAGNOSIS — Z1231 Encounter for screening mammogram for malignant neoplasm of breast: Secondary | ICD-10-CM

## 2018-11-25 ENCOUNTER — Other Ambulatory Visit: Payer: Medicare Other

## 2018-11-25 ENCOUNTER — Ambulatory Visit: Payer: Medicare Other

## 2018-12-16 ENCOUNTER — Ambulatory Visit: Payer: Medicare Other | Admitting: Physician Assistant

## 2019-01-27 ENCOUNTER — Ambulatory Visit: Payer: Medicare Other | Admitting: Specialist

## 2019-02-03 ENCOUNTER — Other Ambulatory Visit: Payer: Self-pay

## 2019-02-03 ENCOUNTER — Ambulatory Visit
Admission: RE | Admit: 2019-02-03 | Discharge: 2019-02-03 | Disposition: A | Discharge status: Home / Self Care | Payer: Medicare Other | Source: Ambulatory Visit | Attending: Physician Assistant | Admitting: Physician Assistant

## 2019-02-03 ENCOUNTER — Ambulatory Visit
Admission: RE | Admit: 2019-02-03 | Discharge: 2019-02-03 | Disposition: A | Discharge status: Home / Self Care | Payer: Medicare Other | Source: Ambulatory Visit | Attending: Specialist | Admitting: Specialist

## 2019-02-03 DIAGNOSIS — Z1231 Encounter for screening mammogram for malignant neoplasm of breast: Secondary | ICD-10-CM

## 2019-02-03 DIAGNOSIS — E2839 Other primary ovarian failure: Secondary | ICD-10-CM

## 2019-02-04 ENCOUNTER — Other Ambulatory Visit: Payer: Medicare Other

## 2019-02-04 ENCOUNTER — Ambulatory Visit: Payer: Medicare Other

## 2019-02-18 ENCOUNTER — Ambulatory Visit: Payer: Medicare Other | Admitting: Specialist

## 2019-03-03 ENCOUNTER — Ambulatory Visit: Payer: Medicare Other | Admitting: Specialist

## 2019-05-24 ENCOUNTER — Ambulatory Visit: Payer: Medicare Other | Admitting: Specialist

## 2019-05-27 ENCOUNTER — Ambulatory Visit: Payer: Medicare Other | Admitting: Specialist

## 2019-05-28 ENCOUNTER — Ambulatory Visit: Payer: Medicare Other | Admitting: Specialist

## 2019-06-03 ENCOUNTER — Other Ambulatory Visit: Payer: Self-pay

## 2019-06-03 ENCOUNTER — Encounter: Payer: Self-pay | Admitting: Specialist

## 2019-06-03 ENCOUNTER — Ambulatory Visit (INDEPENDENT_AMBULATORY_CARE_PROVIDER_SITE_OTHER): Payer: Medicare Other | Admitting: Specialist

## 2019-06-03 VITALS — BP 137/99 | HR 68 | Ht 63.0 in | Wt 256.0 lb

## 2019-06-03 DIAGNOSIS — M4322 Fusion of spine, cervical region: Secondary | ICD-10-CM

## 2019-06-03 DIAGNOSIS — M4326 Fusion of spine, lumbar region: Secondary | ICD-10-CM | POA: Diagnosis not present

## 2019-06-03 DIAGNOSIS — M4815 Ankylosing hyperostosis [Forestier], thoracolumbar region: Secondary | ICD-10-CM | POA: Diagnosis not present

## 2019-06-03 DIAGNOSIS — M5442 Lumbago with sciatica, left side: Secondary | ICD-10-CM | POA: Diagnosis not present

## 2019-06-03 DIAGNOSIS — M5135 Other intervertebral disc degeneration, thoracolumbar region: Secondary | ICD-10-CM

## 2019-06-03 DIAGNOSIS — G8929 Other chronic pain: Secondary | ICD-10-CM

## 2019-06-03 NOTE — Progress Notes (Signed)
Office Visit Note   Patient: [name]           Date of Birth: May 07, 1945           MRN: [id number] Visit Date: 06/03/2019              Requested by: Loletta Specter, PA-C No address on file PCP: Loletta Specter, PA-C   Assessment & Plan: Visit Diagnoses:  1. Forestier's disease of thoracolumbar region   2. Fusion of spine of cervical region   3. Fusion of lumbar spine   4. DDD (degenerative disc disease), thoracolumbar   5. Chronic midline low back pain with left-sided sciatica     Plan: Avoid overhead lifting and overhead use of the arms. Do not lift greater than 5 lbs. Adjust head rest in vehicle to prevent hyperextension if rear ended. Take extra precautions to avoid falling.Avoid frequent bending and stooping  No lifting greater than 10 lbs. May use ice or moist heat for pain. Weight loss is of benefit. Exercise is important to improve your indurance and does allow people to function better inspite of back pain.[name] Best medication for lumbar disc disease is arthritis medications like motrin, celebrex and naprosyn.   Follow-Up Instructions: Return in about 4 weeks (around 07/01/2019).   Orders:  No orders of the defined types were placed in this encounter.  No orders of the defined types were placed in this encounter.     Procedures: No procedures performed   Clinical Data: No additional findings.   Subjective: Chief Complaint  Patient presents with  . Left Thumb - Cyst  . Left Forearm - Cyst    74 year old female with history of cervical fusion 2014 with mild posterior neck pain which she states she is okay with. She underwent 2 level TLIFs in 2017. Today she relates she is feeling okay no complaints. No bowel or bladder difficulty. She is  Walking in the house for exercise due to weather concerns. She is cold intolerant. History of partial thyroidectomy. Pain left wrist and left forearm there is a swelling in the left dorsal thumb MCC joint.  She is having mucous in the evening. Has intolerant of fatty food. No complaints.    Review of Systems   Objective: Vital Signs: BP (!) 137/99   Pulse 68   Ht 5\' 3"  (1.6 m)   Wt 256 lb (116.1 kg)   BMI 45.35 kg/m   Physical Exam  Ortho Exam  Specialty Comments:  No specialty comments available.  Imaging: No results found.   PMFS History: Patient Active Problem List   Diagnosis Date Noted  . Neurogenic claudication due to lumbar spinal stenosis 09/30/2017    Priority: High    Class: Chronic  . Spinal stenosis, lumbar region, with neurogenic claudication 01/23/2016    Priority: High    Class: Chronic  . Other secondary scoliosis, 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    Priority: High    Class: Chronic  . HNP (herniated nucleus pulposus), cervical 07/20/2012    Priority: High    Class: Acute  . Cervical spondylosis without myelopathy 07/20/2012    Priority: High    Class: Chronic  . Fusion of spine of thoracolumbar region 09/30/2017  . S/P partial thyroidectomy 08/23/2016  . Spondylolisthesis of 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  . Diastolic dysfunction   . Hypokalemia 03/08/2015  . Bradycardia 03/08/2015  . Syncope and collapse 03/02/2015  . Pancreatic cyst 02/28/2015  . Venous (peripheral) insufficiency 06/08/2010  .  Asymptomatic varicose veins 06/07/2010  . HYPERLIPIDEMIA 04/16/2010  . Essential hypertension 04/16/2010  . GERD 04/16/2010  . HIATAL HERNIA 04/16/2010  . OSTEOARTHRITIS 04/16/2010  . DYSPHAGIA UNSPECIFIED 04/16/2010  . Atrophic vaginitis 03/31/2009   Past Medical History:  Diagnosis Date  . Allergy   . Arthritis   . Cataract of right eye   . Diastolic dysfunction   . Dizzy   . GERD (gastroesophageal reflux disease)   . History of blood transfusion   . Hyperlipidemia   . Hypertension   . Pancreatic cyst 02/2015   Recommend follow-up MRI in 12 months.  . Pneumonia    "years ago"    Family History  Problem Relation Age of Onset  . Ovarian cancer  Mother   . Heart disease Mother   . Prostate cancer Father   . Heart disease Father   . Colon cancer Father   . Breast cancer Sister   . Prostate cancer Brother   . Cystic fibrosis Sister   . Heart disease Sister   . Pancreatic cancer Neg Hx   . Rectal cancer Neg Hx   . Stomach cancer Neg Hx     Past Surgical History:  Procedure Laterality Date  . ABDOMINAL HYSTERECTOMY    . ANTERIOR CERVICAL DECOMP/DISCECTOMY FUSION N/A 07/20/2012   Procedure: ANTERIOR CERVICAL DISCECTOMY FUSION C5-6, C6-7 with transgraft(Alphatec), local bone graft, plate and screws ;  Surgeon: Jessy Oto, MD;  Location: [facility];  Service: Orthopedics;  Laterality: N/A;  . BACK SURGERY    . CERVICAL FUSION    . COLONOSCOPY    . ESOPHAGOGASTRODUODENOSCOPY    . EYE SURGERY Right    cataract   . REPLACEMENT TOTAL KNEE BILATERAL    . THYROIDECTOMY, PARTIAL    . TRANSFORAMINAL LUMBAR INTERBODY FUSION (TLIF) WITH PEDICLE SCREW FIXATION 2 LEVEL N/A 01/23/2016   Procedure: TRANSFORAMINAL LUMBAR INTERBODY FUSION (TLIF) WITH PEDICLE SCREW FIXATION 2 LEVEL WITH HARDWARE REMOVAL AND EXTENSION OF HARDWARE.;  Surgeon: Jessy Oto, MD;  Location: [facility];  Service: Orthopedics;  Laterality: N/A;  L1-L3    Social History   Occupational History  . Occupation: Retired  Tobacco Use  . Smoking status: Never Smoker  . Smokeless tobacco: Never Used  Substance and Sexual Activity  . Alcohol use: No    Alcohol/week: 0.0 standard drinks  . Drug use: No  . Sexual activity: Not on file

## 2019-06-03 NOTE — Patient Instructions (Addendum)
Avoid overhead lifting and overhead use of the arms. Do not lift greater than 5 lbs. Adjust head rest in vehicle to prevent hyperextension if rear ended. Take extra precautions to avoid falling.Avoid frequent bending and stooping  No lifting greater than 10 lbs. May use ice or moist heat for pain. Weight loss is of benefit. Exercise is important to improve your indurance and does allow people to function better inspite of back pain.[name] Best medication for lumbar disc disease is arthritis medications like motrin, celebrex and naprosyn.

## 2019-09-06 ENCOUNTER — Ambulatory Visit: Payer: Medicare Other | Admitting: Specialist

## 2019-10-18 ENCOUNTER — Other Ambulatory Visit: Payer: Self-pay | Admitting: Specialist

## 2019-10-18 NOTE — Telephone Encounter (Signed)
Patient called needing Rx refilled (Naproxen) 500 mg    The number to contact patient is [phone]     Pharmacy# is [phone]   [facility]

## 2019-10-18 NOTE — Telephone Encounter (Signed)
Request sent to Dr. Nitka  

## 2019-10-19 MED ORDER — NAPROXEN 500 MG PO TABS: 500.0000 mg | ORAL_TABLET | Freq: Two times a day (BID) | ORAL | 1 refills | Status: DC

## 2019-10-27 ENCOUNTER — Ambulatory Visit: Payer: Medicare Other | Admitting: Specialist

## 2019-11-29 ENCOUNTER — Encounter: Payer: Self-pay | Admitting: Gastroenterology

## 2019-12-01 ENCOUNTER — Ambulatory Visit: Payer: Medicare Other | Admitting: Specialist

## 2020-01-11 ENCOUNTER — Ambulatory Visit: Payer: Medicare Other | Admitting: Gastroenterology

## 2020-01-28 ENCOUNTER — Other Ambulatory Visit: Payer: Self-pay | Admitting: General Practice

## 2020-01-28 DIAGNOSIS — Z1231 Encounter for screening mammogram for malignant neoplasm of breast: Secondary | ICD-10-CM

## 2020-02-21 ENCOUNTER — Encounter: Payer: Self-pay | Admitting: Gastroenterology

## 2020-02-21 ENCOUNTER — Ambulatory Visit: Payer: Medicare Other

## 2020-03-12 ENCOUNTER — Other Ambulatory Visit: Payer: Self-pay | Admitting: Specialist

## 2020-03-21 ENCOUNTER — Ambulatory Visit: Payer: Medicare Other

## 2020-04-07 ENCOUNTER — Ambulatory Visit: Payer: Medicare Other | Admitting: Specialist

## 2020-04-10 ENCOUNTER — Ambulatory Visit: Payer: Medicare Other | Admitting: Specialist

## 2020-04-10 ENCOUNTER — Ambulatory Visit: Payer: Medicare Other | Admitting: Gastroenterology

## 2020-05-03 ENCOUNTER — Ambulatory Visit: Payer: Medicare Other

## 2020-05-08 ENCOUNTER — Ambulatory Visit: Payer: Self-pay

## 2020-05-08 ENCOUNTER — Other Ambulatory Visit: Payer: Self-pay

## 2020-05-08 ENCOUNTER — Encounter: Payer: Self-pay | Admitting: Specialist

## 2020-05-08 ENCOUNTER — Ambulatory Visit (INDEPENDENT_AMBULATORY_CARE_PROVIDER_SITE_OTHER): Payer: Medicare Other | Admitting: Specialist

## 2020-05-08 VITALS — BP 135/81 | HR 70 | Ht 63.0 in | Wt 265.6 lb

## 2020-05-08 DIAGNOSIS — M4326 Fusion of spine, lumbar region: Secondary | ICD-10-CM

## 2020-05-08 DIAGNOSIS — M25559 Pain in unspecified hip: Secondary | ICD-10-CM

## 2020-05-08 DIAGNOSIS — M160 Bilateral primary osteoarthritis of hip: Secondary | ICD-10-CM

## 2020-05-08 DIAGNOSIS — M4322 Fusion of spine, cervical region: Secondary | ICD-10-CM

## 2020-05-08 DIAGNOSIS — M25551 Pain in right hip: Secondary | ICD-10-CM

## 2020-05-08 DIAGNOSIS — M7061 Trochanteric bursitis, right hip: Secondary | ICD-10-CM

## 2020-05-08 DIAGNOSIS — M4815 Ankylosing hyperostosis [Forestier], thoracolumbar region: Secondary | ICD-10-CM

## 2020-05-08 DIAGNOSIS — M5135 Other intervertebral disc degeneration, thoracolumbar region: Secondary | ICD-10-CM

## 2020-05-08 NOTE — Patient Instructions (Signed)
Avoid frequent bending and stooping  No lifting greater than 15-20 lbs. May use ice or moist heat for pain. Weight loss is of benefit. Best medication for lumbar disc disease is arthritis medications like motrin, celebrex and naprosyn. Exercise is important to improve your indurance and does allow people to function better inspite of back pain. Will ask Dr. Prince Rome to perform right hip greater trochanteric bursa cortisone injection to relieve the pain you are experiencing. If the injection is not of benefit you may also wish to try therapy or be seen by Dr. Charlann Boxer for consideration of hip surgery.

## 2020-05-08 NOTE — Progress Notes (Signed)
Ultrasound guided injection is preferred based studies that show increased duration, increased effect, greater accuracy, decreased procedural pain, increased response rate, and decreased cost with ultrasound guided versus blind injection.   Verbal informed consent obtained.  Time-out conducted.  Noted no overlying erythema, induration, or other signs of local infection. Ultrasound-guided right hip injection: After sterile prep with Betadine, injected 4 cc 0.25% bupivacaine without epinephrine and 6 mg betamethasone using a 22-gauge spinal needle, passing the needle into the area of maximal tenderness at the right greater trochanter.  The trochanter was visualized with ultrasound prior to injection for indirect guidance.  [name] had good relief during the immediate anesthetic phase.

## 2020-05-08 NOTE — Progress Notes (Signed)
Office Visit Note   Patient: [name]           Date of Birth: 1945-08-30           MRN: [id number] Visit Date: 05/08/2020              Requested by: Loletta Specter, PA-C No address on file PCP: Loletta Specter, PA-C   Assessment & Plan: Visit Diagnoses:  1. Hip pain   2. Forestier's disease of thoracolumbar region   3. Fusion of spine of cervical region   4. Fusion of lumbar spine   5. DDD (degenerative disc disease), thoracolumbar   6. Bilateral primary osteoarthritis of hip   7. Greater trochanteric bursitis, right     Plan: Avoid frequent bending and stooping  No lifting greater than 15-20 lbs. May use ice or moist heat for pain. Weight loss is of benefit. Best medication for lumbar disc disease is arthritis medications like motrin, celebrex and naprosyn. Exercise is important to improve your indurance and does allow people to function better inspite of back pain. Will ask Dr. Prince Rome to perform right hip greater trochanteric bursa cortisone injection to relieve the pain you are experiencing. If the injection is not of benefit you may also wish to try therapy or be seen by Dr. Charlann Boxer for consideration of hip surgery.  Follow-Up Instructions: No follow-ups on file.   Orders:  Orders Placed This Encounter  Procedures  . XR HIPS BILAT W OR W/O PELVIS 3-4 VIEWS  . US Guided Needle Placement - No Linked Charges   No orders of the defined types were placed in this encounter.     Procedures: No procedures performed   Clinical Data: No additional findings.   Subjective: Chief Complaint  Patient presents with  . Right Hip - Pain  . Left Hip - Pain    75 year old female with history of 3 level lumbar fusion L3 to S1. She has pain in both of her hips right greater than left. There is AM pain with first standing and walking in the house a lot helps. No bowel or bladder changes. Has a history of GERD and acid reflux. She is having pain into the right  lower back and buttock. She relates that she had the surgery and just deals with it. She is doing her own house cleaning, Grocery shops and does her own work. She has pain with lying on the right side.    Review of Systems  Constitutional: Negative.   HENT: Negative.   Eyes: Negative.   Respiratory: Negative.   Cardiovascular: Negative.   Endocrine: Negative.   Genitourinary: Negative.   Allergic/Immunologic: Negative.   Neurological: Negative.   Hematological: Negative.      Objective: Vital Signs: BP 135/81 (BP Location: Left Arm, Patient Position: Sitting)   Pulse 70   Ht 5\' 3"  (1.6 m)   Wt 265 lb 9.6 oz (120.5 kg)   BMI 47.05 kg/m   Physical Exam Constitutional:      Appearance: She is well-developed and well-nourished.  HENT:     Head: Normocephalic and atraumatic.  Eyes:     Extraocular Movements: EOM normal.     Pupils: Pupils are equal, round, and reactive to light.  Pulmonary:     Effort: Pulmonary effort is normal.     Breath sounds: Normal breath sounds.  Abdominal:     General: Bowel sounds are normal.     Palpations: Abdomen is soft.  Musculoskeletal:  General: Normal range of motion.     Cervical back: Normal range of motion and neck supple.  Skin:    General: Skin is warm and dry.  Neurological:     Mental Status: She is alert and oriented to person, place, and time.  Psychiatric:        Mood and Affect: Mood and affect normal.        Behavior: Behavior normal.        Thought Content: Thought content normal.        Judgment: Judgment normal.     Ortho Exam  Specialty Comments:  No specialty comments available.  Imaging: XR HIPS BILAT W OR W/O PELVIS 3-4 VIEWS  Result Date: 05/08/2020 Radiographs of the pelvis and right hip demonstrate bilateral hip osteoarthritis changes with central hip narrowing and subchondral sclerosis of the hip joint line bilaterally. There is superolateral acetabular lip Osteophyte. Hardware right side from  the thoracolumbar junction to L5 and left to L4. Fusion from T9 to L5.     PMFS History: Patient Active Problem List   Diagnosis Date Noted  . Neurogenic claudication due to lumbar spinal stenosis 09/30/2017    Priority: High    Class: Chronic  . Spinal stenosis, lumbar region, with neurogenic claudication 01/23/2016    Priority: High    Class: Chronic  . Other secondary scoliosis, 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    Priority: High    Class: Chronic  . HNP (herniated nucleus pulposus), cervical 07/20/2012    Priority: High    Class: Acute  . Cervical spondylosis without myelopathy 07/20/2012    Priority: High    Class: Chronic  . Fusion of spine of thoracolumbar region 09/30/2017  . S/P partial thyroidectomy 08/23/2016  . Spondylolisthesis of 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  . Diastolic dysfunction   . Hypokalemia 03/08/2015  . Bradycardia 03/08/2015  . Syncope and collapse 03/02/2015  . Pancreatic cyst 02/28/2015  . Venous (peripheral) insufficiency 06/08/2010  . Asymptomatic varicose veins 06/07/2010  . HYPERLIPIDEMIA 04/16/2010  . Essential hypertension 04/16/2010  . GERD 04/16/2010  . HIATAL HERNIA 04/16/2010  . OSTEOARTHRITIS 04/16/2010  . DYSPHAGIA UNSPECIFIED 04/16/2010  . Atrophic vaginitis 03/31/2009   Past Medical History:  Diagnosis Date  . Allergy   . Arthritis   . Cataract of right eye   . Diastolic dysfunction   . Dizzy   . GERD (gastroesophageal reflux disease)   . History of blood transfusion   . Hyperlipidemia   . Hypertension   . Pancreatic cyst 02/2015   Recommend follow-up MRI in 12 months.  . Pneumonia    "years ago"    Family History  Problem Relation Age of Onset  . Ovarian cancer Mother   . Heart disease Mother   . Prostate cancer Father   . Heart disease Father   . Colon cancer Father   . Breast cancer Sister   . Prostate cancer Brother   . Cystic fibrosis Sister   . Heart disease Sister   . Pancreatic cancer Neg Hx   . Rectal cancer Neg  Hx   . Stomach cancer Neg Hx     Past Surgical History:  Procedure Laterality Date  . ABDOMINAL HYSTERECTOMY    . ANTERIOR CERVICAL DECOMP/DISCECTOMY FUSION N/A 07/20/2012   Procedure: ANTERIOR CERVICAL DISCECTOMY FUSION C5-6, C6-7 with transgraft(Alphatec), local bone graft, plate and screws ;  Surgeon: Kerrin Champagne, MD;  Location: [facility] OR;  Service: Orthopedics;  Laterality: N/A;  . BACK SURGERY    .  CERVICAL FUSION    . COLONOSCOPY    . ESOPHAGOGASTRODUODENOSCOPY    . EYE SURGERY Right    cataract   . REPLACEMENT TOTAL KNEE BILATERAL    . THYROIDECTOMY, PARTIAL    . TRANSFORAMINAL LUMBAR INTERBODY FUSION (TLIF) WITH PEDICLE SCREW FIXATION 2 LEVEL N/A 01/23/2016   Procedure: TRANSFORAMINAL LUMBAR INTERBODY FUSION (TLIF) WITH PEDICLE SCREW FIXATION 2 LEVEL WITH HARDWARE REMOVAL AND EXTENSION OF HARDWARE.;  Surgeon: Kerrin Champagne, MD;  Location: [facility] OR;  Service: Orthopedics;  Laterality: N/A;  L1-L3    Social History   Occupational History  . Occupation: Retired  Tobacco Use  . Smoking status: Never Smoker  . Smokeless tobacco: Never Used  Vaping Use  . Vaping Use: Never used  Substance and Sexual Activity  . Alcohol use: No    Alcohol/week: 0.0 standard drinks  . Drug use: No  . Sexual activity: Not on file

## 2020-05-19 ENCOUNTER — Telehealth: Payer: Self-pay

## 2020-06-26 ENCOUNTER — Ambulatory Visit: Payer: Medicare Other | Admitting: Specialist

## 2020-07-13 ENCOUNTER — Telehealth: Payer: Self-pay | Admitting: Radiology

## 2020-07-13 ENCOUNTER — Ambulatory Visit (INDEPENDENT_AMBULATORY_CARE_PROVIDER_SITE_OTHER): Payer: Medicare Other | Admitting: Specialist

## 2020-07-13 ENCOUNTER — Encounter: Payer: Self-pay | Admitting: Specialist

## 2020-07-13 ENCOUNTER — Ambulatory Visit: Payer: Self-pay

## 2020-07-13 ENCOUNTER — Other Ambulatory Visit: Payer: Self-pay

## 2020-07-13 VITALS — BP 133/76 | HR 71 | Ht 63.0 in | Wt 266.0 lb

## 2020-07-13 DIAGNOSIS — G8929 Other chronic pain: Secondary | ICD-10-CM

## 2020-07-13 DIAGNOSIS — M4326 Fusion of spine, lumbar region: Secondary | ICD-10-CM

## 2020-07-13 DIAGNOSIS — M4815 Ankylosing hyperostosis [Forestier], thoracolumbar region: Secondary | ICD-10-CM

## 2020-07-13 DIAGNOSIS — M160 Bilateral primary osteoarthritis of hip: Secondary | ICD-10-CM

## 2020-07-13 DIAGNOSIS — M5442 Lumbago with sciatica, left side: Secondary | ICD-10-CM

## 2020-07-13 DIAGNOSIS — M533 Sacrococcygeal disorders, not elsewhere classified: Secondary | ICD-10-CM

## 2020-07-13 DIAGNOSIS — M25559 Pain in unspecified hip: Secondary | ICD-10-CM

## 2020-07-13 NOTE — Telephone Encounter (Signed)
Patient would like a copy of her xrays from 07/13/20. Please call her when this has been made. Thank you.

## 2020-07-13 NOTE — Progress Notes (Addendum)
Office Visit Note   Patient: [name]           Date of Birth: 04-14-1946           MRN: [id number] Visit Date: 07/13/2020              Requested by: Loletta Specter, PA-C No address on file PCP: Loletta Specter, PA-C   Assessment & Plan: Visit Diagnoses:  1. Forestier's disease of thoracolumbar region   2. Chronic midline low back pain with left-sided sciatica   3. Hip pain   4. Fusion of lumbar spine   5. Sacroiliac joint disease   6. Bilateral primary osteoarthritis of hip     Plan: Avoid frequent bending and stooping  No lifting greater than 10 lbs. May use ice or moist heat for pain. Weight loss is of benefit. Exercise is important to improve your indurance and does allow people to function better inspite of back pain. Arthritis medications for pain are helpful, they can cause kidney injury or elevated liver enzyme test. Tylenol is less irritating but can cause liver injury in high doses.  See Dr. Charlann Boxer to have your hips assessed. You may want to consider having the right sacroiliac joint injected to determine how much of your pain is  Due to the arthritis in the right sacroiliac joint.  Jury Duty excuse permanently   fffffffffffffffffffffffffffffffffffffffffffffffffffffffffffffffffffffffffffffffffffffffffffff Follow-Up Instructions: Return in about 3 months (around 10/13/2020).   Orders:  Orders Placed This Encounter  Procedures   XR Lumbar Spine 2-3 Views   XR Thoracic Spine 2 View   No orders of the defined types were placed in this encounter.      No procedures performed   Clinical Data: No additional findings.   Subjective: Chief Complaint  Patient presents with   Lower Back - Follow-up, Pain   Right Hip - Follow-up, Pain   Left Hip - Follow-up, Pain    75 year old female with history of bilateral TKR, now almost 3 years ago with fusion to T10 from S1, she did well with this and has pain right hip and stiffness both hips. Her  radiographs show DJD of the hips right less than left. Her fusion appears solid on last study but that was done 09/2017. There was sign of right hip SI arthrosis. She relates that she is scheduled to see Dr. Charlann Boxer in 2-3 weeks. She has pain with standing and walking.    Review of Systems  Constitutional: Negative.   HENT: Negative.    Eyes: Negative.   Respiratory: Negative.    Cardiovascular: Negative.   Gastrointestinal: Negative.   Endocrine: Negative.   Genitourinary: Negative.   Musculoskeletal: Negative.   Skin: Negative.   Allergic/Immunologic: Negative.   Neurological: Negative.   Hematological: Negative.   Psychiatric/Behavioral: Negative.      Objective: Vital Signs: BP 133/76 (BP Location: Left Arm, Patient Position: Sitting)   Pulse 71   Ht 5\' 3"  (1.6 m)   Wt 266 lb (120.7 kg)   BMI 47.12 kg/m   Physical Exam Constitutional:      Appearance: She is well-developed.  HENT:     Head: Normocephalic and atraumatic.  Eyes:     Pupils: Pupils are equal, round, and reactive to light.  Pulmonary:     Effort: Pulmonary effort is normal.     Breath sounds: Normal breath sounds.  Abdominal:     General: Bowel sounds are normal.     Palpations: Abdomen is  soft.  Musculoskeletal:     Cervical back: Normal range of motion and neck supple.  Skin:    General: Skin is warm and dry.  Neurological:     Mental Status: She is alert and oriented to person, place, and time.  Psychiatric:        Behavior: Behavior normal.        Thought Content: Thought content normal.        Judgment: Judgment normal.    Right Hip Exam   Tenderness  The patient is experiencing tenderness in the anterior, lateral and greater trochanter.  Range of Motion  Abduction:  abnormal  Adduction:  abnormal  Extension:  abnormal  Flexion:  abnormal  External rotation:  abnormal  Internal rotation:  abnormal   Muscle Strength  Abduction: 5/5  Flexion: 4/5   Tests  FABER: positive Ober:  negative  Other  Erythema: absent Scars: absent Sensation: normal Pulse: present   Left Hip Exam   Tenderness  The patient is experiencing tenderness in the anterior and lateral.  Range of Motion  Abduction:  abnormal  Adduction:  abnormal  Extension:  abnormal  Flexion:  abnormal  External rotation:  abnormal  Internal rotation: abnormal   Muscle Strength  Abduction: 5/5  Adduction: 5/5  Flexion: 4/5   Tests  FABER: positive Ober: negative  Other  Erythema: absent Sensation: normal Pulse: present     Specialty Comments:  No specialty comments available.  Imaging: No results found.   PMFS History: Patient Active Problem List   Diagnosis Date Noted   Neurogenic claudication due to lumbar spinal stenosis 09/30/2017    Priority: High    Class: Chronic   Spinal stenosis, lumbar region, with neurogenic claudication 01/23/2016    Priority: High    Class: Chronic   Other secondary scoliosis, 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    Priority: High    Class: Chronic   HNP (herniated nucleus pulposus), cervical 07/20/2012    Priority: High    Class: Acute   Cervical spondylosis without myelopathy 07/20/2012    Priority: High    Class: Chronic   Fusion of spine of thoracolumbar region 09/30/2017   S/P partial thyroidectomy 08/23/2016   Spondylolisthesis of 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   Diastolic dysfunction    Hypokalemia 03/08/2015   Bradycardia 03/08/2015   Syncope and collapse 03/02/2015   Pancreatic cyst 02/28/2015   Venous (peripheral) insufficiency 06/08/2010   Asymptomatic varicose veins 06/07/2010   HYPERLIPIDEMIA 04/16/2010   Essential hypertension 04/16/2010   GERD 04/16/2010   HIATAL HERNIA 04/16/2010   OSTEOARTHRITIS 04/16/2010   DYSPHAGIA UNSPECIFIED 04/16/2010   Atrophic vaginitis 03/31/2009   Past Medical History:  Diagnosis Date   Allergy    Arthritis    Cataract of right eye    Diastolic dysfunction    Dizzy    GERD (gastroesophageal  reflux disease)    History of blood transfusion    Hyperlipidemia    Hypertension    Pancreatic cyst 02/2015   Recommend follow-up MRI in 12 months.   Pneumonia    "years ago"    Family History  Problem Relation Age of Onset   Ovarian cancer Mother    Heart disease Mother    Prostate cancer Father    Heart disease Father    Colon cancer Father    Breast cancer Sister    Prostate cancer Brother    Cystic fibrosis Sister    Heart disease Sister    Pancreatic cancer Neg Hx  Rectal cancer Neg Hx    Stomach cancer Neg Hx     Past Surgical History:  Procedure Laterality Date   ABDOMINAL HYSTERECTOMY     ANTERIOR CERVICAL DECOMP/DISCECTOMY FUSION N/A 07/20/2012   Procedure: ANTERIOR CERVICAL DISCECTOMY FUSION C5-6, C6-7 with transgraft(Alphatec), local bone graft, plate and screws ;  Surgeon: Kerrin Champagne, MD;  Location: [facility] OR;  Service: Orthopedics;  Laterality: N/A;   BACK SURGERY     CERVICAL FUSION     COLONOSCOPY     ESOPHAGOGASTRODUODENOSCOPY     EYE SURGERY Right    cataract    REPLACEMENT TOTAL KNEE BILATERAL     THYROIDECTOMY, PARTIAL     TRANSFORAMINAL LUMBAR INTERBODY FUSION (TLIF) WITH PEDICLE SCREW FIXATION 2 LEVEL N/A 01/23/2016   Procedure: TRANSFORAMINAL LUMBAR INTERBODY FUSION (TLIF) WITH PEDICLE SCREW FIXATION 2 LEVEL WITH HARDWARE REMOVAL AND EXTENSION OF HARDWARE.;  Surgeon: Kerrin Champagne, MD;  Location: [facility] OR;  Service: Orthopedics;  Laterality: N/A;  L1-L3    Social History   Occupational History   Occupation: Retired  Tobacco Use   Smoking status: Never   Smokeless tobacco: Never  Vaping Use   Vaping Use: Never used  Substance and Sexual Activity   Alcohol use: No    Alcohol/week: 0.0 standard drinks   Drug use: No   Sexual activity: Not on file

## 2020-07-13 NOTE — Patient Instructions (Signed)
Avoid frequent bending and stooping  No lifting greater than 10 lbs. May use ice or moist heat for pain. Weight loss is of benefit. Exercise is important to improve your indurance and does allow people to function better inspite of back pain. Arthritis medications for pain are helpful, they can cause kidney injury or elevated liver enzyme test. Tylenol is less irritating but can cause liver injury in high doses.  See Dr. Charlann Boxer to have your hips assessed. You may want to consider having the right sacroiliac joint injected to determine how much of your pain is  Due to the arthritis in the right sacroiliac joint.

## 2020-07-17 NOTE — Telephone Encounter (Signed)
Called patient and informed was ready for pick up

## 2020-09-11 ENCOUNTER — Telehealth: Payer: Self-pay | Admitting: Gastroenterology

## 2020-09-11 NOTE — Telephone Encounter (Signed)
Inbound call from [name] [facility] insurance, [employer]. Patient have procedure 8/9. Would need a prior authorization request before actually procedure date.

## 2020-11-19 ENCOUNTER — Encounter: Payer: Self-pay | Admitting: Gastroenterology

## 2020-11-21 ENCOUNTER — Ambulatory Visit: Payer: Medicare Other

## 2020-11-21 ENCOUNTER — Telehealth: Payer: Self-pay

## 2020-11-21 NOTE — Telephone Encounter (Signed)
Attempted to reach pt but no answer to Virtual visit and voice mail not set up.  Attempted to reach pt x 3

## 2020-11-21 NOTE — Progress Notes (Signed)
Attempted to reach pt but no answer to Virtual visit and voice mail not set up.  Attempted to reach pt x 3  

## 2020-11-29 ENCOUNTER — Encounter: Payer: Self-pay | Admitting: Gastroenterology

## 2020-12-05 ENCOUNTER — Encounter: Payer: Medicare Other | Admitting: Gastroenterology

## 2021-01-09 ENCOUNTER — Ambulatory Visit: Payer: Medicare Other | Admitting: Physician Assistant

## 2021-03-05 ENCOUNTER — Ambulatory Visit: Payer: Medicare Other | Admitting: Nurse Practitioner

## 2021-04-03 ENCOUNTER — Other Ambulatory Visit: Payer: Self-pay | Admitting: Family Medicine

## 2021-04-03 ENCOUNTER — Ambulatory Visit
Admission: RE | Admit: 2021-04-03 | Discharge: 2021-04-03 | Disposition: A | Discharge status: Home / Self Care | Payer: Medicare Other | Source: Ambulatory Visit | Attending: Family Medicine | Admitting: Family Medicine

## 2021-04-03 DIAGNOSIS — J40 Bronchitis, not specified as acute or chronic: Secondary | ICD-10-CM

## 2021-05-12 ENCOUNTER — Encounter (HOSPITAL_COMMUNITY): Payer: Self-pay

## 2021-05-12 ENCOUNTER — Emergency Department (HOSPITAL_COMMUNITY)
Admission: EM | Admit: 2021-05-12 | Discharge: 2021-05-12 | Disposition: A | Discharge status: Home / Self Care | Payer: Medicare Other | Attending: Emergency Medicine | Admitting: Emergency Medicine

## 2021-05-12 ENCOUNTER — Emergency Department (HOSPITAL_COMMUNITY): Payer: Medicare Other

## 2021-05-12 ENCOUNTER — Other Ambulatory Visit: Payer: Self-pay

## 2021-05-12 DIAGNOSIS — R0789 Other chest pain: Secondary | ICD-10-CM

## 2021-05-12 DIAGNOSIS — Z20822 Contact with and (suspected) exposure to covid-19: Secondary | ICD-10-CM | POA: Insufficient documentation

## 2021-05-12 DIAGNOSIS — B9789 Other viral agents as the cause of diseases classified elsewhere: Secondary | ICD-10-CM | POA: Insufficient documentation

## 2021-05-12 DIAGNOSIS — R410 Disorientation, unspecified: Secondary | ICD-10-CM

## 2021-05-12 DIAGNOSIS — Z112 Encounter for screening for other bacterial diseases: Secondary | ICD-10-CM | POA: Insufficient documentation

## 2021-05-12 DIAGNOSIS — J029 Acute pharyngitis, unspecified: Secondary | ICD-10-CM | POA: Diagnosis present

## 2021-05-12 DIAGNOSIS — J028 Acute pharyngitis due to other specified organisms: Secondary | ICD-10-CM | POA: Diagnosis not present

## 2021-05-12 DIAGNOSIS — R4182 Altered mental status, unspecified: Secondary | ICD-10-CM | POA: Insufficient documentation

## 2021-05-12 LAB — CBC
HCT: 40.1 % (ref 36.0–46.0)
Hemoglobin: 13.2 g/dL (ref 12.0–15.0)
MCH: 26.5 pg (ref 26.0–34.0)
MCHC: 32.9 g/dL (ref 30.0–36.0)
MCV: 80.4 fL (ref 80.0–100.0)
Platelets: 248 10*3/uL (ref 150–400)
RBC: 4.99 MIL/uL (ref 3.87–5.11)
RDW: 15.3 % (ref 11.5–15.5)
WBC: 4.2 10*3/uL (ref 4.0–10.5)
nRBC: 0 % (ref 0.0–0.2)

## 2021-05-12 LAB — BASIC METABOLIC PANEL
Anion gap: 11 (ref 5–15)
BUN: 14 mg/dL (ref 8–23)
CO2: 29 mmol/L (ref 22–32)
Calcium: 9.8 mg/dL (ref 8.9–10.3)
Chloride: 98 mmol/L (ref 98–111)
Creatinine, Ser: 1.19 mg/dL — ABNORMAL HIGH (ref 0.44–1.00)
GFR, Estimated: 48 mL/min — ABNORMAL LOW (ref >60)
Glucose, Bld: 115 mg/dL — ABNORMAL HIGH (ref 70–99)
Potassium: 4 mmol/L (ref 3.5–5.1)
Sodium: 138 mmol/L (ref 135–145)

## 2021-05-12 LAB — RESP PANEL BY RT-PCR (FLU A&B, COVID) ARPGX2
Influenza A by PCR: NEGATIVE
Influenza B by PCR: NEGATIVE
SARS Coronavirus 2 by RT PCR: NEGATIVE

## 2021-05-12 LAB — URINALYSIS, ROUTINE W REFLEX MICROSCOPIC
Bilirubin Urine: NEGATIVE
Glucose, UA: NEGATIVE mg/dL
Hgb urine dipstick: NEGATIVE
Ketones, ur: NEGATIVE mg/dL
Leukocytes,Ua: NEGATIVE
Nitrite: NEGATIVE
Protein, ur: NEGATIVE mg/dL
Specific Gravity, Urine: 1.01 (ref 1.005–1.030)
pH: 6 (ref 5.0–8.0)

## 2021-05-12 LAB — TROPONIN I (HIGH SENSITIVITY)
Troponin I (High Sensitivity): 5 ng/L (ref <18)
Troponin I (High Sensitivity): 6 ng/L (ref <18)

## 2021-05-12 MED ORDER — LORAZEPAM 1 MG PO TABS
0.5000 mg | ORAL_TABLET | Freq: Once | ORAL | Status: AC
  Administered 2021-05-12: 0.5 mg via ORAL
  Filled 2021-05-12: qty 1

## 2021-05-12 NOTE — ED Triage Notes (Signed)
Patient complains of ongoing cough and congestion and states that her chest doesn't feel right. States I just dont feel well. Patient alert and oriented, NAD

## 2021-05-12 NOTE — Discharge Instructions (Signed)
You were seen in the emergency department today for chest pain and shortness of breath.  Your lab work and imaging has all been reassuring today.  I'd like you to follow-up with your primary doctor this week as we discussed.  Continue to monitor how you're doing and return to the ER for new or worsening symptoms such as chest pain with exertion, fevers, chills, or more confusion.   It has been a pleasure seeing and caring for you today and I hope you start feeling better soon!

## 2021-05-12 NOTE — ED Provider Notes (Signed)
[facility] DEPART[facility] Provider Note   CSN: [id number] Arrival date & time: 05/12/21  1257     History  No chief complaint on file.   [name] is a 76 y.o. female with no history of dementia who presents to the emergency department complaining of intermittent chest pain for several days.  Patient states that she has seconds long episodes of centralized chest pain multiple times per day.  No aggravating or alleviating factors.  Family member at bedside states patient has been more confused in the past several days, cannot give me clear details.  She believes that patient has not been attending her primary care appointments, but is staying with her this week to take her to the doctor.  HPI     Home Medications Prior to Admission medications   Medication Sig Start Date End Date Taking? Authorizing Provider  amLODipine (NORVASC) 5 MG tablet Take 1 tablet (5 mg total) by mouth at bedtime. 01/07/18   Clent Demark, PA-C  aspirin EC 81 MG tablet Take 1 tablet (81 mg total) by mouth daily. 01/07/18   Clent Demark, PA-C  ezetimibe (ZETIA) 10 MG tablet Take 1 tablet (10 mg total) by mouth daily. 05/19/18   Fulp, Cammie, MD  Multiple Vitamin (MULTIVITAMIN WITH MINERALS) TABS tablet Take 1 tablet by mouth daily.    [provider]  naproxen (NAPROSYN) 500 MG tablet TAKE 1 TABLET BY MOUTH  TWICE DAILY WITH A MEAL 03/13/20   Jessy Oto, MD  rosuvastatin (CRESTOR) 20 MG tablet Take 20 mg by mouth at bedtime. 12/26/19   [provider]  simvastatin (ZOCOR) 20 MG tablet TAKE 1 TABLET BY MOUTH  DAILY AT 6 PM. 08/19/17 01/21/18  Vivi Barrack, MD      Allergies    Lisinopril, Penicillins, Tramadol hcl, and Tramadol hcl    Review of Systems   Review of Systems  Constitutional:  Negative for chills and fever.  HENT:  Negative for congestion.   Respiratory:  Positive for cough and shortness of breath.   Cardiovascular:  Positive for  chest pain.  Gastrointestinal:  Negative for abdominal pain, constipation, diarrhea, nausea and vomiting.  Genitourinary:  Negative for dysuria, flank pain and hematuria.  Psychiatric/Behavioral:  Positive for confusion.   All other systems reviewed and are negative.  Physical Exam Updated Vital Signs BP 135/66    Pulse 61    Temp 98.4 F (36.9 C) (Oral)    Resp 18    SpO2 99%  Physical Exam Vitals and nursing note reviewed.  Constitutional:      Appearance: Normal appearance.  HENT:     Head: Normocephalic and atraumatic.  Eyes:     Conjunctiva/sclera: Conjunctivae normal.  Cardiovascular:     Rate and Rhythm: Normal rate and regular rhythm.  Pulmonary:     Effort: Pulmonary effort is normal. No respiratory distress.     Breath sounds: Normal breath sounds.  Abdominal:     General: There is no distension.     Palpations: Abdomen is soft.     Tenderness: There is no abdominal tenderness.  Skin:    General: Skin is warm and dry.  Neurological:     General: No focal deficit present.     Mental Status: She is alert.     Comments: Neuro: A&O x 4. Speech is clear, able to follow commands. CN III-XII intact grossly intact. PERRLA. EOMI. Sensation intact throughout. Str 5/5 all extremities.  ED Results / Procedures / Treatments   Labs (all labs ordered are listed, but only abnormal results are displayed) Labs Reviewed  BASIC METABOLIC PANEL - Abnormal; Notable for the following components:      Result Value   Glucose, Bld 115 (*)    Creatinine, Ser 1.19 (*)    GFR, Estimated 48 (*)    All other components within normal limits  RESP PANEL BY RT-PCR (FLU A&B, COVID) ARPGX2  CBC  URINALYSIS, ROUTINE W REFLEX MICROSCOPIC  TROPONIN I (HIGH SENSITIVITY)  TROPONIN I (HIGH SENSITIVITY)    EKG EKG Interpretation  Date/Time:  Saturday May 12 2021 13:11:48 EST Ventricular Rate:  62 PR Interval:  154 QRS Duration: 84 QT Interval:  432 QTC Calculation: 438 R  Axis:   -57 Text Interpretation: Normal sinus rhythm Left axis deviation Anterolateral infarct , age undetermined Abnormal ECG When compared with ECG of 10-Oct-2016 09:16, PREVIOUS ECG IS PRESENT similar to prior tracing Confirmed by Wynona Dove ([id number]) on 05/12/2021 4:31:06 PM  Radiology DG Chest 2 View  Result Date: 05/12/2021 CLINICAL DATA:  Cough, congestion, chest pain EXAM: CHEST - 2 VIEW COMPARISON:  04/03/2021 chest radiograph. FINDINGS: Surgical hardware from ACDF overlies the lower cervical spine. Partially visualized bilateral posterior spinal fusion hardware in the lower thoracic and lumbar spine. Stable cardiomediastinal silhouette with normal heart size. No pneumothorax. No pleural effusion. Lungs appear clear, with no acute consolidative airspace disease and no pulmonary edema. IMPRESSION: No active cardiopulmonary disease. Electronically Signed   By: Ilona Sorrel M.D.   On: 05/12/2021 14:14   CT Head Wo Contrast  Result Date: 05/12/2021 CLINICAL DATA:  Mental status change unknown etiology. EXAM: CT HEAD WITHOUT CONTRAST TECHNIQUE: Contiguous axial images were obtained from the base of the skull through the vertex without intravenous contrast. RADIATION DOSE REDUCTION: This exam was performed according to the departmental dose-optimization program which includes automated exposure control, adjustment of the mA and/or kV according to patient size and/or use of iterative reconstruction technique. COMPARISON:  Head CT without contrast 03/02/2015 FINDINGS: Brain: There is mild cerebral cortical atrophy with stable prominence of the ventricles, without midline shift. There is moderately advanced small vessel disease of the cerebral white matter and a few tiny chronic bilateral gangliocapsular lacunar infarctions. Cerebellum and brainstem are unremarkable. No focal asymmetry is seen worrisome for acute infarct, hemorrhage or mass. Basal cisterns are clear. Vascular: There are patchy calcifications  in the carotid siphons but no hyperdense central vessels. Skull: Normal. Negative for fracture or focal lesion. Sinuses/Orbits: There is mild patchy membrane thickening in the ethmoid sinus air cells. There are small retention cysts in the right maxillary and left sphenoid sinus. Other visible sinuses and the bilateral mastoid air cells are clear. Other: None. IMPRESSION: 1. No acute intracranial CT findings or interval changes. 2. Small vessel disease and mild atrophy with stable prominence of the ventricles. 3. Sinus disease. Electronically Signed   By: Telford Nab M.D.   On: 05/12/2021 20:30    Procedures Procedures    Medications Ordered in ED Medications  LORazepam (ATIVAN) tablet 0.5 mg (0.5 mg Oral Given 05/12/21 1947)    ED Course/ Medical Decision Making/ A&P                           Medical Decision Making This patient presents to the ED for concern of shortness of breath and chest pain, this involves an extensive number of treatment options, and is  a complaint that carries with it a high risk of complications and morbidity. The emergent differential diagnosis includes, but is not limited to, Acute coronary syndrome, tamponade, pericarditis/myocarditis, aortic dissection, pulmonary embolism, tension pneumothorax, pneumonia, and esophageal rupture, COPD, asthma, pneumonia, pneumothorax, primary pulmonary hypertension, PE/VQ mismatch.  Co morbidities that complicate the patient evaluation: HTN HLD, GERD  Additional history obtained from family member at bedside. States patient has been more confused in the past 2 weeks or so. No abrupt changes.    Physical exam performed. The pertinent findings include: Lung sounds clear to auscultation all fields, heart regular rate and rhythm, abdomen soft, nontender nondistended.  Neurologic exam normal as above.  Lab Tests: I Ordered, and personally interpreted labs.  The pertinent results include: No leukocytosis or anemia.  Creatinine appears  stable compared to prior. Initial troponin 5 Delta troponin 6. Negative COVID and flu testing.   Urinalysis   Imaging Studies ordered: I ordered imaging studies including chest x-ray and head CT. I independently visualized and interpreted imaging which showed no acute cardiopulmonary or intracranial abnormalities. I agree with the radiologist interpretation.   Cardiac Monitoring: The patient was maintained on a cardiac monitor.  My attending physician Dr. Pearline Cables viewed and interpreted the cardiac monitored which showed an underlying rhythm of: Normal sinus rhythm stable compared to prior.   Medicines ordered and prescription drug management: I ordered medication including ativan for anxiety. Reevaluation of the patient after these medicines showed that the patient improved. I have reviewed the patients home medicines and have made adjustments as needed.  Dispostion: After consideration of the diagnostic results and the patients response to treatment, I feel that patient is not requiring admission or inpatient treatment for her symptoms.  She has remained afebrile, and hemodynamically stable. Patient is to be discharged with recommendation to follow up with PCP in regards to today's hospital visit. She has an appointment on Wednesday with her PCP to discuss progressive cognitive decline and possible dementia. Chest pain is not likely of cardiac or pulmonary etiology d/t presentation, PERC negative, VSS, no tracheal deviation, no JVD or new murmur, RRR, breath sounds equal bilaterally, EKG without acute abnormalities, negative troponin, and negative CXR. Pt has been advised to return to the ED if CP becomes exertional, associated with diaphoresis or nausea, radiates to left jaw/arm, worsens or becomes concerning in any way. Pt appears reliable for follow up and is agreeable to discharge.   Case has been discussed with Dr. Pearline Cables who agrees with the above plan to discharge.   Final Clinical Impression(s) /  ED Diagnoses Final diagnoses:  Atypical chest pain  Confusion    Rx / DC Orders ED Discharge Orders     None      Portions of this report may have been transcribed using voice recognition software. Every effort was made to ensure accuracy; however, inadvertent computerized transcription errors may be present.    Kathrynne Kulinski T, PA-C 05/12/21 2108    Jeanell Sparrow, DO 05/12/21 2137

## 2021-05-12 NOTE — ED Notes (Signed)
Pt wanting to leave AMA. PA aware.

## 2021-07-19 ENCOUNTER — Encounter: Payer: Self-pay | Admitting: Physician Assistant

## 2021-07-31 ENCOUNTER — Ambulatory Visit: Payer: Medicare Other | Admitting: Physician Assistant

## 2021-07-31 NOTE — Progress Notes (Incomplete)
? ? ?Assessment/Plan:  ? ?[name] is a very pleasant 76 y.o. 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 MoCA today is ? ? ? Recommendations:  ? ?Memory Loss  ? ?MRI brain with/without contrast to assess for underlying structural abnormality and assess vascular load  ?Neurocognitive testing to further evaluate cognitive concerns and determine other underlying cause of memory changes, including potential contribution from sleep, anxiety, or depression  ?Check B12, TSH ?Discussed safety both in and out of the home.  ?Discussed the importance of regular daily schedule with inclusion of crossword puzzles to maintain brain function.  ?Continue to monitor mood with PCP.  ?Stay active at least 30 minutes at least 3 times a week.  ?Naps should be scheduled and should be no longer than 60 minutes and should not occur after 2 PM.  ?Control cardiovascular risk factors  ?Mediterranean diet is recommended  ?Folllow up once results above are available  ? ?Subjective:  ? ? ?The patient is seen in neurologic consultation at the request of Ellyn HackShah, Syed Asad A, MD for the evaluation of memory.  The patient is accompanied by  who supplements the history. ?This is a 76 y.o. year old RH  female who has had memory issues for about   ?She was seen by her PCP on January 2023, due to complaints of cognitive decline reported by her children.  Slums at that time was 17, consistent with dementia.  She denies any complaints, although admits that occasionally she gets off task, or forget what she is doing when she is in a conversation.  Her son reports that she has been displaying concerning behaviors such as repeatedly calling her sister, and  recently forgetting that her brother had died.  She is more irritable and defensive.  Currently she is living with her son. ? ?Memory ?Repeats himself ?Disoriented when walking into a room ?Leaving objects  ?Ambulates   ?Falls ?Head injuries ?Wandering off  ?Drive ?Lives with ?Mood ?Depression ?Irritability ?CW puzzles Word Finding Board Games Painting Coloring ?Sleeps ?Vivid Dreams Sleepwalking ?Hallucinations ?Paranoia ?Hygiene concerns ?Bathing ?Dressing ?Medications pillbox ?Finances  ?Appetite  ?trouble swallowing.  ?Cooks.  stove on or the faucet on. ?  ?headaches, double vision, dizziness, focal numbness or tingling, unilateral weakness, tremors or anosmia. No history of seizures. Denies urine incontinence, retention, constipation or diarrhea.  Denies OSA, ETOH or Tobacco. Family History ? ?CT head 05/12/21 No acute intracranial CT findings or interval changes. Moderately advancedSmall vessel disease, timing chronic bilateral gangliocapsular lacunar infarctions, and mild atrophy with stable prominence of the ventricles without midline shift. Sinus disease. ? ?Labs 05/24/2021 TSH 2.26 B12 380 Chronic neutropenia with a white count of 3.6 and ANC of 1.3 ?Hemoglobin 12.8, hematocrit 38.5 ? ?Allergies  ?Allergen Reactions  ? Lisinopril Swelling  ?  Swelling in mouth and throat  ? Penicillins Rash  ?  Has patient had a PCN reaction causing immediate rash, facial/tongue/throat swelling, SOB or lightheadedness with hypotension: No ?Has patient had a PCN reaction causing severe rash involving mucus membranes or skin necrosis: No ?Has patient had a PCN reaction that required hospitalization No ?Has patient had a PCN reaction occurring within the last 10 years: No ?If all of the above answers are "NO", then may proceed  with Cephalosporin use. ?  ? Tramadol Hcl Rash  ? Tramadol Hcl Rash  ? ? ?Current Outpatient Medications  ?Medication Instructions  ? amLODipine (NORVASC) 5 mg, Oral, Daily at bedtime  ? aspirin EC 81 mg,  Oral, Daily  ? ezetimibe (ZETIA) 10 mg, Oral, Daily  ? Multiple Vitamin (MULTIVITAMIN WITH MINERALS) TABS tablet 1 tablet, Oral, Daily  ? naproxen (NAPROSYN) 500 MG tablet TAKE 1 TABLET BY MOUTH  TWICE DAILY WITH A MEAL  ? rosuvastatin (CRESTOR) 20 mg, Oral, Daily at bedtime  ? ? ? ?VITALS:  There were no vitals filed for this visit. ? ?  05/19/2018  ? 11:05 AM 03/02/2018  ? 11:02 AM 01/07/2018  ?  2:13 PM 01/06/2017  ? 12:46 PM 08/23/2016  ? 11:21 AM  ?Depression screen PHQ 2/9  ?Decreased Interest 0 0 0 3 0  ?Down, Depressed, Hopeless 0 0 0 3 0  ?PHQ - 2 Score 0 0 0 6 0  ?Altered sleeping  0 1 0   ?Tired, decreased energy  1 1 3    ?Change in appetite  1 0 3   ?Feeling bad or failure about yourself   0 1 0   ?Trouble concentrating  0 0 0   ?Moving slowly or fidgety/restless  0 1 0   ?Suicidal thoughts  0 0 0   ?PHQ-9 Score  2 4 12    ? ? ?PHYSICAL EXAM  ? ?HEENT:  Normocephalic, atraumatic. The mucous membranes are moist. The superficial temporal arteries are without ropiness or tenderness. ?Cardiovascular: Regular rate and rhythm. ?Lungs: Clear to auscultation bilaterally. ?Neck: There are no carotid bruits noted bilaterally. ? ?NEUROLOGICAL: ?   ? View : No data to display.  ?  ?  ?  ?  ?   ? View : No data to display.  ?  ?  ?  ?  ? ?Orientation:  Alert and oriented to person, place and time. No aphasia or dysarthria. Fund of knowledge is appropriate. Recent memory impaired and remote memory intact.  Attention and concentration are normal.  Able to name objects and repeat phrases. Delayed recall   ?Cranial nerves: There is good facial symmetry. Extraocular muscles are intact and visual fields are full to confrontational testing. Speech is fluent and clear. Soft palate rises symmetrically and there is no tongue deviation. Hearing is intact to conversational tone. ?Tone: Tone is good throughout. ?Sensation: Sensation is intact to light touch and pinprick throughout. Vibration is intact at the bilateral big toe.There is  no extinction with double simultaneous stimulation. There is no sensory dermatomal level identified. ?Coordination: The patient has no difficulty with RAM's or FNF bilaterally. Normal finger to nose  ?Motor: Strength is 5/5 in the bilateral upper and lower extremities. There is no pronator drift. There are no fasciculations noted. ?DTR's: Deep tendon reflexes are 2/4 at the bilateral biceps, triceps, brachioradialis, patella and achilles.  Plantar responses are downgoing bilaterally. ?Gait and Station: The patient is able to ambulate without difficulty.The patient is able to heel toe walk without any difficulty.The patient is able to ambulate in a tandem fashion. The patient is able to stand in the Romberg position. ?  ? ? ?Thank you for allowing the opportunity to participate in the care of this nice patient. Please do not hesitate to contact for any questions or concerns.  ? ?Total time spent on today's visit was 60 minutes, including both face-to-face time and nonface-to-face time.  Time included that spent on review  of records (prior notes available to me/labs/imaging if pertinent), discussing treatment and goals, answering patient's questions and coordinating care. ? ?Cc:  Loletta Specter, PA-C ? ?[name] ?07/31/2021 7:26 AM   ?

## 2021-09-28 ENCOUNTER — Other Ambulatory Visit: Payer: Self-pay | Admitting: Family Medicine

## 2021-09-28 DIAGNOSIS — Z1231 Encounter for screening mammogram for malignant neoplasm of breast: Secondary | ICD-10-CM

## 2021-10-23 ENCOUNTER — Ambulatory Visit: Payer: Medicare HMO

## 2021-10-29 ENCOUNTER — Ambulatory Visit: Payer: Medicare HMO

## 2021-11-08 ENCOUNTER — Ambulatory Visit: Payer: Medicare HMO

## 2022-01-04 ENCOUNTER — Ambulatory Visit: Payer: Medicare HMO

## 2022-01-23 ENCOUNTER — Ambulatory Visit: Payer: Medicare HMO

## 2022-02-20 ENCOUNTER — Ambulatory Visit: Payer: Medicare HMO | Admitting: Orthopedic Surgery

## 2022-06-27 NOTE — Telephone Encounter (Signed)
Error

## 2023-12-05 DIAGNOSIS — R296 Repeated falls: Secondary | ICD-10-CM | POA: Diagnosis not present

## 2023-12-05 DIAGNOSIS — F320 Major depressive disorder, single episode, mild: Secondary | ICD-10-CM | POA: Diagnosis not present

## 2023-12-05 DIAGNOSIS — Z79899 Other long term (current) drug therapy: Secondary | ICD-10-CM | POA: Diagnosis not present

## 2023-12-05 DIAGNOSIS — Z1159 Encounter for screening for other viral diseases: Secondary | ICD-10-CM | POA: Diagnosis not present

## 2023-12-05 DIAGNOSIS — F0390 Unspecified dementia without behavioral disturbance: Secondary | ICD-10-CM | POA: Diagnosis not present

## 2023-12-05 DIAGNOSIS — G8929 Other chronic pain: Secondary | ICD-10-CM | POA: Diagnosis not present

## 2023-12-05 DIAGNOSIS — Z0001 Encounter for general adult medical examination with abnormal findings: Secondary | ICD-10-CM | POA: Diagnosis not present

## 2023-12-05 DIAGNOSIS — F3341 Major depressive disorder, recurrent, in partial remission: Secondary | ICD-10-CM | POA: Diagnosis not present

## 2023-12-09 ENCOUNTER — Other Ambulatory Visit: Payer: Self-pay | Admitting: Adult Health Nurse Practitioner

## 2023-12-09 ENCOUNTER — Ambulatory Visit
Admission: RE | Admit: 2023-12-09 | Discharge: 2023-12-09 | Disposition: A | Discharge status: Home / Self Care | Source: Ambulatory Visit | Attending: Adult Health Nurse Practitioner | Admitting: Adult Health Nurse Practitioner

## 2023-12-09 DIAGNOSIS — M25552 Pain in left hip: Secondary | ICD-10-CM

## 2023-12-09 DIAGNOSIS — M25562 Pain in left knee: Secondary | ICD-10-CM

## 2023-12-09 DIAGNOSIS — M1612 Unilateral primary osteoarthritis, left hip: Secondary | ICD-10-CM | POA: Diagnosis not present

## 2024-01-02 DIAGNOSIS — E669 Obesity, unspecified: Secondary | ICD-10-CM | POA: Diagnosis not present

## 2024-01-02 DIAGNOSIS — M25552 Pain in left hip: Secondary | ICD-10-CM | POA: Diagnosis not present

## 2024-01-02 DIAGNOSIS — F0390 Unspecified dementia without behavioral disturbance: Secondary | ICD-10-CM | POA: Diagnosis not present

## 2024-01-02 DIAGNOSIS — Z6838 Body mass index (BMI) 38.0-38.9, adult: Secondary | ICD-10-CM | POA: Diagnosis not present

## 2024-01-02 DIAGNOSIS — M25562 Pain in left knee: Secondary | ICD-10-CM | POA: Diagnosis not present

## 2024-05-03 ENCOUNTER — Emergency Department (HOSPITAL_COMMUNITY)

## 2024-05-03 ENCOUNTER — Other Ambulatory Visit: Payer: Self-pay

## 2024-05-03 ENCOUNTER — Encounter (HOSPITAL_COMMUNITY): Payer: Self-pay

## 2024-05-03 ENCOUNTER — Inpatient Hospital Stay (HOSPITAL_COMMUNITY)
Admission: EM | Admit: 2024-05-03 | Discharge: 2024-05-15 | DRG: 564 | Disposition: A | Discharge status: Skilled Nursing Facility | Attending: Family Medicine | Admitting: Family Medicine

## 2024-05-03 DIAGNOSIS — F0390 Unspecified dementia without behavioral disturbance: Secondary | ICD-10-CM | POA: Diagnosis not present

## 2024-05-03 DIAGNOSIS — Z880 Allergy status to penicillin: Secondary | ICD-10-CM

## 2024-05-03 DIAGNOSIS — Z981 Arthrodesis status: Secondary | ICD-10-CM

## 2024-05-03 DIAGNOSIS — M7731 Calcaneal spur, right foot: Secondary | ICD-10-CM | POA: Diagnosis present

## 2024-05-03 DIAGNOSIS — M25551 Pain in right hip: Secondary | ICD-10-CM | POA: Diagnosis present

## 2024-05-03 DIAGNOSIS — R7989 Other specified abnormal findings of blood chemistry: Secondary | ICD-10-CM | POA: Diagnosis not present

## 2024-05-03 DIAGNOSIS — R7401 Elevation of levels of liver transaminase levels: Secondary | ICD-10-CM | POA: Diagnosis not present

## 2024-05-03 DIAGNOSIS — Z888 Allergy status to other drugs, medicaments and biological substances status: Secondary | ICD-10-CM

## 2024-05-03 DIAGNOSIS — Z803 Family history of malignant neoplasm of breast: Secondary | ICD-10-CM

## 2024-05-03 DIAGNOSIS — Y92009 Unspecified place in unspecified non-institutional (private) residence as the place of occurrence of the external cause: Secondary | ICD-10-CM

## 2024-05-03 DIAGNOSIS — M6282 Rhabdomyolysis: Secondary | ICD-10-CM | POA: Diagnosis present

## 2024-05-03 DIAGNOSIS — Z8249 Family history of ischemic heart disease and other diseases of the circulatory system: Secondary | ICD-10-CM

## 2024-05-03 DIAGNOSIS — M25571 Pain in right ankle and joints of right foot: Secondary | ICD-10-CM | POA: Diagnosis present

## 2024-05-03 DIAGNOSIS — N179 Acute kidney failure, unspecified: Secondary | ICD-10-CM | POA: Diagnosis present

## 2024-05-03 DIAGNOSIS — I129 Hypertensive chronic kidney disease with stage 1 through stage 4 chronic kidney disease, or unspecified chronic kidney disease: Secondary | ICD-10-CM | POA: Diagnosis present

## 2024-05-03 DIAGNOSIS — N1831 Chronic kidney disease, stage 3a: Secondary | ICD-10-CM | POA: Diagnosis present

## 2024-05-03 DIAGNOSIS — Z885 Allergy status to narcotic agent status: Secondary | ICD-10-CM

## 2024-05-03 DIAGNOSIS — Z800 Family history of malignant neoplasm of digestive organs: Secondary | ICD-10-CM

## 2024-05-03 DIAGNOSIS — Z79899 Other long term (current) drug therapy: Secondary | ICD-10-CM

## 2024-05-03 DIAGNOSIS — E785 Hyperlipidemia, unspecified: Secondary | ICD-10-CM | POA: Diagnosis present

## 2024-05-03 DIAGNOSIS — Z8349 Family history of other endocrine, nutritional and metabolic diseases: Secondary | ICD-10-CM

## 2024-05-03 DIAGNOSIS — Z8041 Family history of malignant neoplasm of ovary: Secondary | ICD-10-CM

## 2024-05-03 DIAGNOSIS — Z7982 Long term (current) use of aspirin: Secondary | ICD-10-CM

## 2024-05-03 DIAGNOSIS — W010XXA Fall on same level from slipping, tripping and stumbling without subsequent striking against object, initial encounter: Secondary | ICD-10-CM | POA: Diagnosis present

## 2024-05-03 DIAGNOSIS — W19XXXA Unspecified fall, initial encounter: Principal | ICD-10-CM

## 2024-05-03 DIAGNOSIS — Z96653 Presence of artificial knee joint, bilateral: Secondary | ICD-10-CM | POA: Diagnosis present

## 2024-05-03 DIAGNOSIS — Z6841 Body Mass Index (BMI) 40.0 and over, adult: Secondary | ICD-10-CM

## 2024-05-03 DIAGNOSIS — K219 Gastro-esophageal reflux disease without esophagitis: Secondary | ICD-10-CM | POA: Diagnosis present

## 2024-05-03 DIAGNOSIS — F03A0 Unspecified dementia, mild, without behavioral disturbance, psychotic disturbance, mood disturbance, and anxiety: Secondary | ICD-10-CM | POA: Diagnosis present

## 2024-05-03 DIAGNOSIS — U071 COVID-19: Secondary | ICD-10-CM | POA: Clinically undetermined

## 2024-05-03 DIAGNOSIS — T796XXA Traumatic ischemia of muscle, initial encounter: Secondary | ICD-10-CM | POA: Diagnosis not present

## 2024-05-03 DIAGNOSIS — I10 Essential (primary) hypertension: Secondary | ICD-10-CM | POA: Diagnosis present

## 2024-05-03 DIAGNOSIS — E66813 Obesity, class 3: Secondary | ICD-10-CM | POA: Diagnosis present

## 2024-05-03 LAB — CBC WITH DIFFERENTIAL/PLATELET
Abs Immature Granulocytes: 0.02 K/uL (ref 0.00–0.07)
Basophils Absolute: 0 K/uL (ref 0.0–0.1)
Basophils Relative: 0 %
Eosinophils Absolute: 0 K/uL (ref 0.0–0.5)
Eosinophils Relative: 0 %
HCT: 44.6 % (ref 36.0–46.0)
Hemoglobin: 14.7 g/dL (ref 12.0–15.0)
Immature Granulocytes: 0 %
Lymphocytes Relative: 9 %
Lymphs Abs: 0.5 K/uL — ABNORMAL LOW (ref 0.7–4.0)
MCH: 27.1 pg (ref 26.0–34.0)
MCHC: 33 g/dL (ref 30.0–36.0)
MCV: 82.1 fL (ref 80.0–100.0)
Monocytes Absolute: 0.7 K/uL (ref 0.1–1.0)
Monocytes Relative: 12 %
Neutro Abs: 4.9 K/uL (ref 1.7–7.7)
Neutrophils Relative %: 79 %
Platelets: 161 K/uL (ref 150–400)
RBC: 5.43 MIL/uL — ABNORMAL HIGH (ref 3.87–5.11)
RDW: 14.5 % (ref 11.5–15.5)
WBC: 6.1 K/uL (ref 4.0–10.5)
nRBC: 0 % (ref 0.0–0.2)

## 2024-05-03 LAB — COMPREHENSIVE METABOLIC PANEL WITH GFR
ALT: 21 U/L (ref 0–44)
AST: 129 U/L — ABNORMAL HIGH (ref 15–41)
Albumin: 4.1 g/dL (ref 3.5–5.0)
Alkaline Phosphatase: 64 U/L (ref 38–126)
Anion gap: 15 (ref 5–15)
BUN: 34 mg/dL — ABNORMAL HIGH (ref 8–23)
CO2: 23 mmol/L (ref 22–32)
Calcium: 9.6 mg/dL (ref 8.9–10.3)
Chloride: 103 mmol/L (ref 98–111)
Creatinine, Ser: 1.46 mg/dL — ABNORMAL HIGH (ref 0.44–1.00)
GFR, Estimated: 37 mL/min — ABNORMAL LOW
Glucose, Bld: 148 mg/dL — ABNORMAL HIGH (ref 70–99)
Potassium: 4.1 mmol/L (ref 3.5–5.1)
Sodium: 141 mmol/L (ref 135–145)
Total Bilirubin: 0.7 mg/dL (ref 0.0–1.2)
Total Protein: 7.9 g/dL (ref 6.5–8.1)

## 2024-05-03 LAB — LIPASE, BLOOD: Lipase: 113 U/L — ABNORMAL HIGH (ref 11–51)

## 2024-05-03 LAB — TROPONIN T, HIGH SENSITIVITY
Troponin T High Sensitivity: 132 ng/L (ref 0–19)
Troponin T High Sensitivity: 134 ng/L (ref 0–19)

## 2024-05-03 LAB — CK: Total CK: 8488 U/L — ABNORMAL HIGH (ref 38–234)

## 2024-05-03 MED ORDER — LACTATED RINGERS IV BOLUS
1000.0000 mL | Freq: Once | INTRAVENOUS | Status: AC
  Administered 2024-05-03: 1000 mL via INTRAVENOUS

## 2024-05-03 NOTE — ED Triage Notes (Signed)
 PER EMS: pt is from home with unwitnessed fall. She was found by her son on the floor with her right leg stretched out and the left leg stretched out the other way, essentially like a split. She reports right hip pain and numbness to right foot. + pulses. No shortening or rotation per EMS. NO deformities. Hx of dementia, A&Ox3, is baseline for her. Son estimates she was on the floor for maybe one hour. No head injury, no blood thinners, no LOC.   BP- 146/palp, HR-70 RR-18 [id number]

## 2024-05-03 NOTE — ED Provider Triage Note (Signed)
 Emergency Medicine Provider Triage Evaluation Note  [name] , a 79 y.o. female  was evaluated in triage.  Pt complains of unwitnessed fall. She was found by her son on the floor with her right leg stretched out and the left leg stretched out the other way, essentially like a split. She reports right hip pain and numbness to right foot. + pulses. No shortening or rotation per EMS. NO deformities. Hx of dementia, A&Ox3, is baseline for her. Son estimates she was on the floor for maybe one hour. No head injury, no blood thinners, no LOC.   Patient is endorsing pain in her epigastric region when I speak with her as well as a cough, but doesn't mention a fall.   She is A&O x 1-2, not oriented to time or situation, somewhat oriented to location.   Review of Systems  Positive: Epigastric pain, fall Negative: Headache, back pain, neck pain  Physical Exam  BP (!) 133/91 (BP Location: Right Arm)   Pulse 73   Temp 97.9 F (36.6 C) (Oral)   Resp 18   Ht 5' 3 (1.6 m)   Wt 120.7 kg   SpO2 100%   BMI 47.12 kg/m  Gen:   Awake, no distress   Resp:  Normal effort  MSK:   Moves extremities without difficulty  Other:  No leg swelling, no significant abd TTP; NCAT, no midline C spine tenderness palpation deformities or step-offs, no chest wall tenderness palpation.  Pelvis stable nontender.  Some pain with ranging of the right hip but no obvious deformity, right lower extremity edema.   Medical Decision Making  Medically screening exam initiated at 7:17 PM.  Appropriate orders placed.  Addilyne L Falkner was informed that the remainder of the evaluation will be completed by another provider, this initial triage assessment does not replace that evaluation, and the importance of remaining in the ED until their evaluation is complete.  Patient appears stable. Labs/imaging ordered. She will be moved to a treatment space when one becomes available.   Franklyn Sid SAILOR, MD 05/03/24 [phone]

## 2024-05-03 NOTE — ED Provider Notes (Signed)
 " [place] [facility] Provider Note   CSN: [id number] Arrival date & time: 05/03/24  1721     History  Chief Complaint  Patient presents with   [name] is a 79 y.o. female with PMH as listed below who presents with unwitnessed fall. She was found by her son on the floor with her right leg stretched out and the left leg stretched out the other way, essentially like a split. She reports right hip pain.No shortening or rotation per EMS. Hx of dementia. Son estimates she was on the floor for maybe one hour. No head injury, no blood thinners, no LOC. On my assessment, patient is endorsing pain in her epigastric region when I speak with her as well as a cough, but doesn't mention a fall. She also denies numbness/tingling in her right foot. She is A&O x 1-2, not oriented to time or situation, somewhat oriented to location.   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   Pneumonia    years ago       Home Medications Prior to Admission medications  Medication Sig Start Date End Date Taking? Authorizing Provider  amLODipine  (NORVASC ) 5 MG tablet Take 1 tablet (5 mg total) by mouth at bedtime. 01/07/18   Kristy Sharolyn Lenis, PA-C  aspirin  EC 81 MG tablet Take 1 tablet (81 mg total) by mouth daily. 01/07/18   Kristy Sharolyn Lenis, PA-C  ezetimibe  (ZETIA ) 10 MG tablet Take 1 tablet (10 mg total) by mouth daily. 05/19/18   Fulp, Cammie, MD  Multiple Vitamin (MULTIVITAMIN WITH MINERALS) TABS tablet Take 1 tablet by mouth daily.    [provider]  naproxen  (NAPROSYN ) 500 MG tablet TAKE 1 TABLET BY MOUTH  TWICE DAILY WITH A MEAL 03/13/20   Nitka, [name] E, MD  rosuvastatin  (CRESTOR) 20 MG tablet Take 20 mg by mouth at bedtime. 12/26/19   [provider]  simvastatin  (ZOCOR ) 20 MG tablet TAKE 1 TABLET BY MOUTH  DAILY AT 6 PM. 08/19/17 01/21/18  Kennyth Worth HERO, MD      Allergies    Lisinopril , Penicillins, Tramadol hcl, and Tramadol hcl    Review of Systems   Review of Systems A 10 point review of systems was performed and is negative unless otherwise reported in HPI.  Physical Exam Updated Vital Signs BP (!) 125/90 (BP Location: Right Arm)   Pulse 70   Temp 97.8 F (36.6 C) (Oral)   Resp (!) 21   Ht 5' 3 (1.6 m)   Wt 120.7 kg   SpO2 100%   BMI 47.12 kg/m  Physical Exam General: Normal appearing elderly female, lying in bed.  HEENT: NCAT, PERRLA, Sclera anicteric, MMM, trachea midline. No midline C-spine TTP deformities or step-offs. Cardiology: RRR, no murmurs/rubs/gallops. No chest wall TTP or crepitus. Resp: Normal respiratory rate and effort. CTAB, no wheezes, rhonchi, crackles.  Abd: Soft, non-tender, non-distended. No rebound tenderness or guarding.  GU: Deferred. MSK: No peripheral edema or signs of trauma. Extremities without deformity or TTP. Pain in R hip reported w/ passive ranging of R hip. Skin: warm, dry.  Neuro: A&Ox1-2, CNs II-XII grossly intact. MAEs. Sensation grossly intact.  Psych: Normal mood and affect.   ED Results / Procedures / Treatments   Labs (all labs ordered are listed, but only abnormal results are displayed) Labs Reviewed  CBC WITH DIFFERENTIAL/PLATELET - Abnormal; Notable for the following components:      Result Value   RBC 5.43 (*)    Lymphs Abs 0.5 (*)    All other components within normal limits  COMPREHENSIVE METABOLIC PANEL WITH GFR - Abnormal; Notable for the following components:   Glucose, Bld 148 (*)    BUN 34 (*)    Creatinine, Ser 1.46 (*)    AST 129 (*)    GFR, Estimated 37 (*)    All other components within normal limits  CK - Abnormal; Notable for the following components:   Total  CK 8,488 (*)    All other components within normal limits  LIPASE, BLOOD - Abnormal; Notable for the following components:   Lipase 113 (*)    All other components within normal limits  TROPONIN T, HIGH SENSITIVITY - Abnormal; Notable for the following components:   Troponin T High Sensitivity 132 (*)    All other components within normal limits  TROPONIN T, HIGH SENSITIVITY - Abnormal; Notable for the following components:   Troponin T High Sensitivity 134 (*)    All other components within normal limits  URINALYSIS, ROUTINE W REFLEX MICROSCOPIC    EKG EKG Interpretation Date/Time:  Monday May 03 2024 22:30:05 EST Ventricular Rate:  74 PR Interval:  142 QRS Duration:  88 QT Interval:  407 QTC Calculation: 452 R Axis:   5  Text Interpretation: Sinus rhythm Confirmed by Franklyn Gills [phone]) on 05/03/2024 11:13:54 PM  Radiology CT Head Wo Contrast Result Date: 05/03/2024 EXAM: CT HEAD WITHOUT 05/03/2024 10:10:27 PM TECHNIQUE: CT of the head was performed without the administration of intravenous contrast. Automated exposure control, iterative reconstruction, and/or weight based adjustment of the mA/kV was utilized to reduce the radiation dose to as low as reasonably achievable. COMPARISON: 05/12/2021 CLINICAL HISTORY: Head trauma, minor (Age >= 65y) FINDINGS: BRAIN AND VENTRICLES: No acute intracranial hemorrhage. No mass effect or midline shift. No extra-axial fluid collection. No evidence of acute infarct. No hydrocephalus. Atrophy and chronic small vessel disease throughout the deep white matter. ORBITS: No acute abnormality. SINUSES AND MASTOIDS: No acute abnormality. SOFT TISSUES AND SKULL: No acute skull fracture. No acute soft tissue abnormality. IMPRESSION: 1. No acute intracranial abnormality. Electronically signed by: Franky Crease MD 05/03/2024 10:13 PM EST RP Workstation: [id number]   DG Hip Unilat  With Pelvis 2-3 Views Right Result Date: 05/03/2024 EXAM: 2 or 3 VIEW(S) XRAY  OF THE RIGHT HIP 05/03/2024 06:34:00 PM COMPARISON: 05/08/2020 CLINICAL HISTORY: fall fall FINDINGS: BONES AND JOINTS: Moderate to advanced osteoarthritis in the hips bilaterally, left more severe than right. No fracture, subluxation, or dislocation. SOFT TISSUES: The soft tissues are unremarkable. IMPRESSION: 1. No evidence of acute traumatic injury. 2. 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 Electronically signed by: Franky Crease MD 05/03/2024 07:24 PM EST RP Workstation: [id number]    Procedures Procedures    Medications Ordered in ED Medications  lactated ringers  bolus 1,000 mL (1,000 mLs Intravenous New Bag/Given 05/03/24 2249)    ED Course/ Medical Decision Making/ A&P                          Medical Decision  Making Amount and/or Complexity of Data Reviewed Labs: ordered. Decision-making details documented in ED Course. Radiology: ordered. Decision-making details documented in ED Course.  Risk Decision regarding hospitalization.    This patient presents to the ED for concern of found down on floor/unwitnessed fall, this involves an extensive number of treatment options, and is a complaint that carries with it a high risk of complications and morbidity.  I considered the following differential and admission for this acute, potentially life threatening condition. HDS, overall well-appearing.  MDM:    DDX for trauma includes but is not limited to:  -Head Injury such as skull fx or ICH - at her baseline mental status, no e/o head trauma; CTH negative -No e/o vertebral injury -Fractures - had reported R hip pain but XR doesn't show fx.  -Unknown how long patient was down on floor, son thinks about an hour, will get CK  Clinical Course as of 05/03/24 2343  Mon May 03, 2024  2048 DG Hip Unilat  With Pelvis 2-3 Views Right 1. No evidence of acute traumatic injury. 2. Moderate to advanced osteoarthritis in the hips bilaterally, left  more severe than right.   [HN]  2221 CK Total(!): 8,488 +rhabdomyolysis; will give fluids [HN]  2221 Troponin T High Sensitivity(!!): 132 +trop elevated [HN]  2222 Creatinine(!): 1.46 +likely very mild aki [HN]  2222 CT Head Wo Contrast 1. No acute intracranial abnormality. [HN]  2229 D/w Dr. Cesario who states it is not uncommon to have mild troponin elevation in rhabdomyolysis, recommends cycling another troponin, would not rush to heparinize this patient.  [HN]  2343 Re-paging for admission [HN]    Clinical Course User Index [HN] Franklyn Sid SAILOR, MD    Labs: I Ordered, and personally interpreted labs.  The pertinent results include:  those listed above  Imaging Studies ordered: I ordered imaging studies including hip XR I independently visualized and interpreted imaging. I agree with the radiologist interpretation  Additional history obtained from EMS, chart review.    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   Pneumonia    years ago     Medicines Meds ordered this encounter  Medications   lactated ringers  bolus 1,000 mL    I have reviewed the patients home medicines and have made adjustments as needed  Problem List / ED Course: Problem List Items Addressed This Visit   None Visit Diagnoses       Fall in home, initial encounter    -  Primary     Non-traumatic rhabdomyolysis         Acute right hip pain                        This note was created using dictation software, which may contain spelling or grammatical errors.    Franklyn Sid SAILOR, MD 05/03/24 2314  "

## 2024-05-04 ENCOUNTER — Other Ambulatory Visit: Payer: Self-pay

## 2024-05-04 ENCOUNTER — Inpatient Hospital Stay (HOSPITAL_COMMUNITY)

## 2024-05-04 ENCOUNTER — Encounter (HOSPITAL_COMMUNITY): Payer: Self-pay | Admitting: Family Medicine

## 2024-05-04 DIAGNOSIS — W19XXXA Unspecified fall, initial encounter: Secondary | ICD-10-CM | POA: Diagnosis not present

## 2024-05-04 DIAGNOSIS — Z880 Allergy status to penicillin: Secondary | ICD-10-CM | POA: Diagnosis not present

## 2024-05-04 DIAGNOSIS — Z8249 Family history of ischemic heart disease and other diseases of the circulatory system: Secondary | ICD-10-CM | POA: Diagnosis not present

## 2024-05-04 DIAGNOSIS — F03A0 Unspecified dementia, mild, without behavioral disturbance, psychotic disturbance, mood disturbance, and anxiety: Secondary | ICD-10-CM | POA: Diagnosis present

## 2024-05-04 DIAGNOSIS — W010XXA Fall on same level from slipping, tripping and stumbling without subsequent striking against object, initial encounter: Secondary | ICD-10-CM | POA: Diagnosis present

## 2024-05-04 DIAGNOSIS — M6282 Rhabdomyolysis: Secondary | ICD-10-CM | POA: Diagnosis present

## 2024-05-04 DIAGNOSIS — R7989 Other specified abnormal findings of blood chemistry: Secondary | ICD-10-CM | POA: Diagnosis present

## 2024-05-04 DIAGNOSIS — F0390 Unspecified dementia without behavioral disturbance: Secondary | ICD-10-CM | POA: Diagnosis not present

## 2024-05-04 DIAGNOSIS — Z7982 Long term (current) use of aspirin: Secondary | ICD-10-CM | POA: Diagnosis not present

## 2024-05-04 DIAGNOSIS — M25571 Pain in right ankle and joints of right foot: Secondary | ICD-10-CM | POA: Diagnosis present

## 2024-05-04 DIAGNOSIS — Z96653 Presence of artificial knee joint, bilateral: Secondary | ICD-10-CM | POA: Diagnosis present

## 2024-05-04 DIAGNOSIS — Y92009 Unspecified place in unspecified non-institutional (private) residence as the place of occurrence of the external cause: Secondary | ICD-10-CM | POA: Diagnosis not present

## 2024-05-04 DIAGNOSIS — Z888 Allergy status to other drugs, medicaments and biological substances status: Secondary | ICD-10-CM | POA: Diagnosis not present

## 2024-05-04 DIAGNOSIS — M25551 Pain in right hip: Secondary | ICD-10-CM | POA: Diagnosis present

## 2024-05-04 DIAGNOSIS — I129 Hypertensive chronic kidney disease with stage 1 through stage 4 chronic kidney disease, or unspecified chronic kidney disease: Secondary | ICD-10-CM | POA: Diagnosis present

## 2024-05-04 DIAGNOSIS — Z6841 Body Mass Index (BMI) 40.0 and over, adult: Secondary | ICD-10-CM | POA: Diagnosis not present

## 2024-05-04 DIAGNOSIS — E66813 Obesity, class 3: Secondary | ICD-10-CM | POA: Diagnosis present

## 2024-05-04 DIAGNOSIS — E785 Hyperlipidemia, unspecified: Secondary | ICD-10-CM | POA: Diagnosis present

## 2024-05-04 DIAGNOSIS — M7731 Calcaneal spur, right foot: Secondary | ICD-10-CM | POA: Diagnosis present

## 2024-05-04 DIAGNOSIS — Z981 Arthrodesis status: Secondary | ICD-10-CM | POA: Diagnosis not present

## 2024-05-04 DIAGNOSIS — U071 COVID-19: Secondary | ICD-10-CM | POA: Diagnosis not present

## 2024-05-04 DIAGNOSIS — T796XXA Traumatic ischemia of muscle, initial encounter: Secondary | ICD-10-CM | POA: Diagnosis present

## 2024-05-04 DIAGNOSIS — N179 Acute kidney failure, unspecified: Secondary | ICD-10-CM | POA: Diagnosis present

## 2024-05-04 DIAGNOSIS — N1831 Chronic kidney disease, stage 3a: Secondary | ICD-10-CM | POA: Diagnosis present

## 2024-05-04 DIAGNOSIS — Z79899 Other long term (current) drug therapy: Secondary | ICD-10-CM | POA: Diagnosis not present

## 2024-05-04 DIAGNOSIS — R7401 Elevation of levels of liver transaminase levels: Secondary | ICD-10-CM | POA: Diagnosis present

## 2024-05-04 DIAGNOSIS — K219 Gastro-esophageal reflux disease without esophagitis: Secondary | ICD-10-CM | POA: Diagnosis present

## 2024-05-04 LAB — COMPREHENSIVE METABOLIC PANEL WITH GFR
ALT: 27 U/L (ref 0–44)
AST: 178 U/L — ABNORMAL HIGH (ref 15–41)
Albumin: 3.9 g/dL (ref 3.5–5.0)
Alkaline Phosphatase: 59 U/L (ref 38–126)
Anion gap: 15 (ref 5–15)
BUN: 37 mg/dL — ABNORMAL HIGH (ref 8–23)
CO2: 22 mmol/L (ref 22–32)
Calcium: 9.4 mg/dL (ref 8.9–10.3)
Chloride: 103 mmol/L (ref 98–111)
Creatinine, Ser: 1.58 mg/dL — ABNORMAL HIGH (ref 0.44–1.00)
GFR, Estimated: 33 mL/min — ABNORMAL LOW
Glucose, Bld: 120 mg/dL — ABNORMAL HIGH (ref 70–99)
Potassium: 4 mmol/L (ref 3.5–5.1)
Sodium: 139 mmol/L (ref 135–145)
Total Bilirubin: 0.8 mg/dL (ref 0.0–1.2)
Total Protein: 7.3 g/dL (ref 6.5–8.1)

## 2024-05-04 LAB — CBC
HCT: 47.4 % — ABNORMAL HIGH (ref 36.0–46.0)
Hemoglobin: 15.5 g/dL — ABNORMAL HIGH (ref 12.0–15.0)
MCH: 26.9 pg (ref 26.0–34.0)
MCHC: 32.7 g/dL (ref 30.0–36.0)
MCV: 82.3 fL (ref 80.0–100.0)
Platelets: 137 K/uL — ABNORMAL LOW (ref 150–400)
RBC: 5.76 MIL/uL — ABNORMAL HIGH (ref 3.87–5.11)
RDW: 14.6 % (ref 11.5–15.5)
WBC: 6 K/uL (ref 4.0–10.5)
nRBC: 0 % (ref 0.0–0.2)

## 2024-05-04 LAB — CK: Total CK: 11864 U/L — ABNORMAL HIGH (ref 38–234)

## 2024-05-04 LAB — MAGNESIUM: Magnesium: 2.5 mg/dL — ABNORMAL HIGH (ref 1.7–2.4)

## 2024-05-04 LAB — PHOSPHORUS: Phosphorus: 3.9 mg/dL (ref 2.5–4.6)

## 2024-05-04 MED ORDER — ACETAMINOPHEN 650 MG RE SUPP: 650.0000 mg | Freq: Four times a day (QID) | RECTAL | Status: DC | PRN

## 2024-05-04 MED ORDER — HEPARIN SODIUM (PORCINE) 5000 UNIT/ML IJ SOLN
5000.0000 [IU] | Freq: Three times a day (TID) | INTRAMUSCULAR | Status: DC
  Administered 2024-05-04 – 2024-05-15 (×33): 5000 [IU] via SUBCUTANEOUS
  Filled 2024-05-04 (×35): qty 1

## 2024-05-04 MED ORDER — OXYCODONE HCL 5 MG PO TABS
5.0000 mg | ORAL_TABLET | ORAL | Status: DC | PRN
  Administered 2024-05-04 (×2): 5 mg via ORAL
  Administered 2024-05-05: 10 mg via ORAL
  Administered 2024-05-06: 5 mg via ORAL
  Administered 2024-05-07 – 2024-05-14 (×13): 10 mg via ORAL
  Administered 2024-05-15: 5 mg via ORAL
  Filled 2024-05-04: qty 1
  Filled 2024-05-04 (×12): qty 2
  Filled 2024-05-04: qty 1
  Filled 2024-05-04 (×4): qty 2
  Filled 2024-05-04: qty 1

## 2024-05-04 MED ORDER — ACETAMINOPHEN 325 MG PO TABS
650.0000 mg | ORAL_TABLET | Freq: Four times a day (QID) | ORAL | Status: DC | PRN
  Administered 2024-05-05 – 2024-05-06 (×2): 650 mg via ORAL
  Filled 2024-05-04 (×2): qty 2

## 2024-05-04 MED ORDER — ONDANSETRON HCL 4 MG PO TABS: 4.0000 mg | ORAL_TABLET | Freq: Four times a day (QID) | ORAL | Status: DC | PRN

## 2024-05-04 MED ORDER — ONDANSETRON HCL 4 MG/2ML IJ SOLN: 4.0000 mg | Freq: Four times a day (QID) | INTRAMUSCULAR | Status: DC | PRN

## 2024-05-04 MED ORDER — SENNA 8.6 MG PO TABS: 1.0000 | ORAL_TABLET | Freq: Every day | ORAL | Status: DC | PRN

## 2024-05-04 MED ORDER — SODIUM CHLORIDE 0.9 % IV SOLN: INTRAVENOUS | Status: AC

## 2024-05-04 NOTE — ED Notes (Signed)
 Bladder scan noted volume of 173 mL. Patient has not urinated since 0300 at start of shift. Patient denies discomfort.

## 2024-05-04 NOTE — Plan of Care (Signed)

## 2024-05-04 NOTE — ED Notes (Signed)
 Patient resting in bed easily aroused. Denies any discomfort or pain at this time.

## 2024-05-04 NOTE — ED Notes (Signed)
 Patient back from CT vitals reassessed, patient reports no pain at this time unless moved.

## 2024-05-04 NOTE — Plan of Care (Signed)
  Problem: Education: Goal: Knowledge of General Education information will improve Description: Including pain rating scale, medication(s)/side effects and non-pharmacologic comfort measures Outcome: Progressing   Problem: Health Behavior/Discharge Planning: Goal: Ability to manage health-related needs will improve Outcome: Progressing   Problem: Clinical Measurements: Goal: Ability to maintain clinical measurements within normal limits will improve Outcome: Progressing Goal: Will remain free from infection Outcome: Progressing Goal: Diagnostic test results will improve Outcome: Progressing Goal: Respiratory complications will improve Outcome: Progressing Goal: Cardiovascular complication will be avoided Outcome: Progressing   Problem: Nutrition: Goal: Adequate nutrition will be maintained Outcome: Progressing   Problem: Coping: Goal: Level of anxiety will decrease Outcome: Progressing   Problem: Pain Managment: Goal: General experience of comfort will improve and/or be controlled Outcome: Progressing   Problem: Safety: Goal: Ability to remain free from injury will improve Outcome: Progressing   Problem: Skin Integrity: Goal: Risk for impaired skin integrity will decrease Outcome: Progressing

## 2024-05-04 NOTE — Progress Notes (Signed)
 " PROGRESS NOTE  [name] FMW:[id number] DOB: [birth year] DOA: 05/03/2024 PCP: [facility]   LOS: 0 days   Brief Narrative / Interim history: Pt is a 79 y/o female with a CC of R hip pain after an unwitnessed fall at home because her knee gave out. Hip pain has been present for 2w due to osteoarthritis but worsened after fall. She presents with rhabdomyolysis and acute kidney injury. Past medical history is significant for osteoarthritis, stage 3a chronic kidney disease, dementia, essential HTN, and hyperlipidemia.   Subjective / 24h Interval events: She states she is feeling better this morning, but complains of severe R knee pain that is too painful to move or touch.  Assesement and Plan: Principal problem:  Rhabdomyolysis- caused by fall; elevated CK, troponin, lipase, AST; increase fluids to 175 mL/hr  Active problems: AKI- chronic kidney disease; GFR dropped to 33; elevated BUN/Cr  R knee pain- unable to bend R knee and extreme pain on palpation; bilateral pedal pulses present; CT ordered  Osteoarthritis- hx of scoliosis, lumbar spinal stenosis, lumbar spondylolisthesis, thoracolumbar spinal fusion, and bilateral knee replacements; bone on bone osteoarthritis of R hip  Essential HTN- managed at home with 5mg  amlodipine  PO daily  Hyperlipidemia- managed at home with 20mg  rosuvastatin PO and 10mg  ezetimibe  PO daily  Dementia- no acute changes, presents at baseline  Stage 3a CKD- baseline GFR 45-59  Hypermagnesemia- acute on chronic kidney disease with rhabdomyolysis  Obesity- BMI 47, mechanical stress on joints, educate family about healthy diet and safe movement (pt is scared to ambulate and fall again)   Scheduled Meds:  heparin   5,000 Units Subcutaneous Q8H   Continuous Infusions:  sodium chloride  125 mL/hr at 05/04/24 0617   PRN Meds:.acetaminophen  **OR** acetaminophen , ondansetron  **OR** ondansetron  (ZOFRAN ) IV, oxyCODONE , senna  Current Outpatient  Medications  Medication Instructions   amLODipine  (NORVASC ) 5 mg, Oral, Daily at bedtime   aspirin  EC 81 mg, Oral, Daily   ezetimibe  (ZETIA ) 10 mg, Oral, Daily   Multiple Vitamin (MULTIVITAMIN WITH MINERALS) TABS tablet 1 tablet, Oral, Daily   naproxen  (NAPROSYN ) 500 MG tablet TAKE 1 TABLET BY MOUTH  TWICE DAILY WITH A MEAL   rosuvastatin (CRESTOR) 20 mg, Oral, Daily at bedtime    Diet Orders (From admission, onward)     Start     Ordered   05/04/24 0050  Diet regular Room service appropriate? Yes; Fluid consistency: Thin  Diet effective now       Question Answer Comment  Room service appropriate? Yes   Fluid consistency: Thin      05/04/24 0051            DVT prophylaxis: heparin  injection 5,000 Units Start: 05/04/24 0600   Lab Results  Component Value Date   PLT 137 (L) 05/04/2024      Code Status: Full Code  Family Communication: no family at bedside  Status is: Inpatient Remains inpatient appropriate because: IV fluids   Level of care: Med-Surg    Objective: Vitals:   05/04/24 0350 05/04/24 0445 05/04/24 0747 05/04/24 0748  BP:  (!) 152/98 (!) 153/86 (!) 153/86  Pulse:  77 81 77  Resp:  19 (!) 21 (!) 21  Temp: 97.9 F (36.6 C) 97.8 F (36.6 C)  98 F (36.7 C)  TempSrc: Oral Oral  Oral  SpO2:  100% 100% 100%  Weight:      Height:       No intake or output data in the 24 hours ending  05/04/24 0752 Wt Readings from Last 3 Encounters:  05/03/24 120.7 kg  07/13/20 120.7 kg  05/08/20 120.5 kg    Examination:  Constitutional: NAD Eyes: no scleral icterus ENMT: Mucous membranes are moist.  Neck: normal, supple Respiratory: clear to auscultation bilaterally, no wheezing, no crackles. Normal respiratory effort. No accessory muscle use.  Cardiovascular: Regular rate and rhythm, no murmurs / rubs / gallops. No LE edema.  Abdomen: non distended, no tenderness.  Musculoskeletal: no clubbing / cyanosis. Strong bilateral pedal pulses. Unable to bend R  knee or lift R leg due to pain. No muscle pain/tenderness in LE. Skin: no rashes Neurologic: non focal   Data Reviewed: I have independently reviewed following labs and imaging studies  CBC Recent Labs  Lab 05/03/24 2120 05/04/24 0235  WBC 6.1 6.0  HGB 14.7 15.5*  HCT 44.6 47.4*  PLT 161 137*  MCV 82.1 82.3  MCH 27.1 26.9  MCHC 33.0 32.7  RDW 14.5 14.6  LYMPHSABS 0.5*  --   MONOABS 0.7  --   EOSABS 0.0  --   BASOSABS 0.0  --     Recent Labs  Lab 05/03/24 2120 05/04/24 0235  NA 141 139  K 4.1 4.0  CL 103 103  CO2 23 22  GLUCOSE 148* 120*  BUN 34* 37*  CREATININE 1.46* 1.58*  CALCIUM 9.6 9.4  AST 129* 178*  ALT 21 27  ALKPHOS 64 59  BILITOT 0.7 0.8  ALBUMIN  4.1 3.9  MG  --  2.5*    ------------------------------------------------------------------------------------------------------------------ No results for input(s): CHOL, HDL, LDLCALC, TRIG, CHOLHDL, LDLDIRECT in the last 72 hours.  No results found for: HGBA1C ------------------------------------------------------------------------------------------------------------------ No results for input(s): TSH, T4TOTAL, T3FREE, THYROIDAB in the last 72 hours.  Invalid input(s): FREET3  Cardiac Enzymes No results for input(s): CKMB, TROPONINI, MYOGLOBIN in the last 168 hours.  Invalid input(s): CK ------------------------------------------------------------------------------------------------------------------ No results found for: BNP  CBG: No results for input(s): GLUCAP in the last 168 hours.  No results found for this or any previous visit (from the past 240 hours).   Radiology Studies: CT PELVIS WO CONTRAST Result Date: 05/04/2024 EXAM: CT Pelvis, Without IV CONTRAST 05/04/2024 01:31:23 AM TECHNIQUE: Axial images were acquired through the pelvis without IV contrast. Reformatted images were reviewed. Automated exposure control, iterative reconstruction, and/or weight  based adjustment of the mA/kV was utilized to reduce the radiation dose to as low as reasonably achievable. COMPARISON: AP pelvis and right hip series 05/03/2024, AP pelvis with left hip series 12/09/2023, and CT abdomen and pelvis with contrast 10/08/2016. CLINICAL HISTORY: Hip trauma, fracture suspected, xray done. FINDINGS: BONES: There is no evidence of fractures of the visualized lower lumbar spine, sacrum, coccyx, pelvis and proximal femurs. Lumbar dorsal fusion hardware is partially visualized with laminectomy at L4 and a grade 2 chronic L4-L5 anterolisthesis. Mild osteopenia. JOINTS: No dislocation. Noted is severe bone-on-bone left hip arthrosis with multiple subchondral cysts of the acetabulum and femoral head, moderate osteophytosis. This was not seen in 2018 or on a hip series from 2022 and could be related to an inflammatory arthropathy or a burned-out septic arthropathy. There is no substantial joint effusion. There are mild features of arthrosis in the right hip, mild pelvic and trochanteric enthesopathy, and slight spurring at the symphysis and SI joints. SOFT TISSUES: There is a small umbilical fat hernia. INTRAPELVIC CONTENTS: Pelvic phleboliths. Sigmoid diverticulosis without acute inflammatory change. There is no pelvic fluid collection, mass or adenopathy. The uterus is surgically absent. No adnexal mass  is seen. IMPRESSION: 1. No acute osseous abnormality. 2. Severe bone-on-bone left hip arthrosis with juxtaarticular cystic changes, new since 2018 and not seen on 2022 hip radiographs, which could be related to an inflammatory arthropathy or a burned-out septic arthropathy. No notable changes seen between 11/2023 and now. 3. Osteopenia and additional degenerative and postsurgical changes. Electronically signed by: Francis Quam MD 05/04/2024 01:55 AM EST RP Workstation: [id number]   CT Head Wo Contrast Result Date: 05/03/2024 EXAM: CT HEAD WITHOUT 05/03/2024 10:10:27 PM TECHNIQUE: CT of the  head was performed without the administration of intravenous contrast. Automated exposure control, iterative reconstruction, and/or weight based adjustment of the mA/kV was utilized to reduce the radiation dose to as low as reasonably achievable. COMPARISON: 05/12/2021 CLINICAL HISTORY: Head trauma, minor (Age >= 65y) FINDINGS: BRAIN AND VENTRICLES: No acute intracranial hemorrhage. No mass effect or midline shift. No extra-axial fluid collection. No evidence of acute infarct. No hydrocephalus. Atrophy and chronic small vessel disease throughout the deep white matter. ORBITS: No acute abnormality. SINUSES AND MASTOIDS: No acute abnormality. SOFT TISSUES AND SKULL: No acute skull fracture. No acute soft tissue abnormality. IMPRESSION: 1. No acute intracranial abnormality. 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 No fracture, subluxation, or dislocation. SOFT TISSUES: The soft tissues are unremarkable. IMPRESSION: 1. No evidence of acute traumatic injury. 2. 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 Electronically signed by: Franky Crease MD 05/03/2024 07:24 PM EST RP Workstation: [id number]     Nilda Fendt, MD, PhD [facility]  Between 7 am - 7 pm I am available, please contact me via Amion (for emergencies) or Securechat (non urgent messages)  Between 7 pm - 7 am I am not available, please contact night coverage MD/APP via Amion  "

## 2024-05-04 NOTE — H&P (Addendum)
 " History and Physical    [name] FMW:[id number] DOB: 1945-09-11 DOA: 05/03/2024  PCP: [facility] [address]   Patient coming from: Home   Chief Complaint: Fall, right hip pain  HPI: [name] is a 79 y.o. female with medical history significant for dementia, hypertension, and hyperlipidemia who presents with right hip pain after an unwitnessed fall at home.  Patient reports that she was preparing breakfast when she slipped and fell.  She does not believe that she hit her head or loss consciousness but has had pain in the right hip with any movement since then.  She was unable to get up after the fall and was eventually found by her son who believes that she could not have been on the ground for more than an hour.  Patient does not believe that she was on the ground for a long time.  Aside from the right hip pain, she does not have any complaints in the ED.  She specifically denies chest pain.  ED Course: Upon arrival to the ED, patient is found to be afebrile and saturating well on room air with normal HR and stable BP.  Labs are most notable for creatinine 1.46, AST 129, normal CBC, serum CK 8488, and troponin 132.  ED physician discussed case with cardiology who suspects that the troponin elevation is due to rhabdomyolysis and does not recommend any additional testing or intervention aside from trending the troponin.  Patient was given a liter of LR in the ED.  Review of Systems:  All other systems reviewed and apart from HPI, are negative.  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   Pneumonia    years ago    Past Surgical History:  Procedure Laterality Date   ABDOMINAL HYSTERECTOMY      ANTERIOR CERVICAL DECOMP/DISCECTOMY FUSION N/A 07/20/2012   Procedure: ANTERIOR CERVICAL DISCECTOMY FUSION C5-6, C6-7 with transgraft(Alphatec), local bone graft, plate and screws ;  Surgeon: Lynwood FORBES Better, MD;  Location: [facility] OR;  Service: Orthopedics;  Laterality: N/A;   BACK SURGERY     CERVICAL FUSION     COLONOSCOPY     ESOPHAGOGASTRODUODENOSCOPY     EYE SURGERY Right    cataract    REPLACEMENT TOTAL KNEE BILATERAL     THYROIDECTOMY, PARTIAL     TRANSFORAMINAL LUMBAR INTERBODY FUSION (TLIF) WITH PEDICLE SCREW FIXATION 2 LEVEL N/A 01/23/2016   Procedure: TRANSFORAMINAL LUMBAR INTERBODY FUSION (TLIF) WITH PEDICLE SCREW FIXATION 2 LEVEL WITH HARDWARE REMOVAL AND EXTENSION OF HARDWARE.;  Surgeon: Lynwood FORBES Better, MD;  Location: [facility] OR;  Service: Orthopedics;  Laterality: N/A;  L1-L3     Social History:   reports that she has never smoked. She has never used smokeless tobacco. She reports that she does not drink alcohol and does not use drugs.  Allergies[1]  Family History  Problem Relation Age of Onset   Ovarian cancer Mother    Heart disease Mother    Prostate cancer Father    Heart disease Father    Colon cancer Father    Breast cancer Sister    Prostate cancer Brother  Cystic fibrosis Sister    Heart disease Sister    Pancreatic cancer Neg Hx    Rectal cancer Neg Hx    Stomach cancer Neg Hx      Prior to Admission medications  Medication Sig Start Date End Date Taking? Authorizing Provider  amLODipine  (NORVASC ) 5 MG tablet Take 1 tablet (5 mg total) by mouth at bedtime. 01/07/18   Kristy Sharolyn Lenis, PA-C  aspirin  EC 81 MG tablet Take 1 tablet (81 mg total) by mouth daily. 01/07/18   Kristy Sharolyn Lenis, PA-C  ezetimibe  (ZETIA ) 10 MG tablet Take 1 tablet (10 mg total) by mouth daily. 05/19/18   Fulp, Cammie, MD  Multiple Vitamin (MULTIVITAMIN WITH MINERALS) TABS tablet Take 1 tablet by mouth daily.    [provider]  naproxen  (NAPROSYN ) 500 MG tablet TAKE 1 TABLET  BY MOUTH  TWICE DAILY WITH A MEAL 03/13/20   Nitka, [name] E, MD  rosuvastatin (CRESTOR) 20 MG tablet Take 20 mg by mouth at bedtime. 12/26/19   [provider]  simvastatin  (ZOCOR ) 20 MG tablet TAKE 1 TABLET BY MOUTH  DAILY AT 6 PM. 08/19/17 01/21/18  Kennyth Worth HERO, MD    Physical Exam: Vitals:   05/03/24 1729 05/03/24 1735 05/03/24 2253  BP: (!) 133/91  (!) 125/90  Pulse: 73  70  Resp: 18  (!) 21  Temp: 97.9 F (36.6 C)  97.8 F (36.6 C)  TempSrc: Oral  Oral  SpO2: 100%  100%  Weight:  120.7 kg   Height:  5' 3 (1.6 m)     Constitutional: NAD, no pallor or diaphoresis   Eyes: PERTLA, lids and conjunctivae normal ENMT: Mucous membranes are moist. Posterior pharynx clear of any exudate or lesions.   Neck: supple, no masses  Respiratory: no wheezing, no crackles. No accessory muscle use.  Cardiovascular: S1 & S2 heard, regular rate and rhythm. No extremity edema.   Abdomen: No tenderness, soft. Bowel sounds active.  Musculoskeletal: no clubbing / cyanosis. Right hip tenderness, neurovascularly intact.   Skin: no significant rashes, lesions, ulcers. Warm, dry, well-perfused. Neurologic: CN 2-12 grossly intact. Moving all extremities. Alert and oriented to person, place, and situation, but not month or year.  Psychiatric: Pleasant. Cooperative.    Labs and Imaging on Admission: I have personally reviewed following labs and imaging studies  CBC: Recent Labs  Lab 05/03/24 2120  WBC 6.1  NEUTROABS 4.9  HGB 14.7  HCT 44.6  MCV 82.1  PLT 161   Basic Metabolic Panel: Recent Labs  Lab 05/03/24 2120  NA 141  K 4.1  CL 103  CO2 23  GLUCOSE 148*  BUN 34*  CREATININE 1.46*  CALCIUM 9.6   GFR: Estimated Creatinine Clearance: 40.6 mL/min (A) (by C-G formula based on SCr of 1.46 mg/dL (H)). Liver Function Tests: Recent Labs  Lab 05/03/24 2120  AST 129*  ALT 21  ALKPHOS 64  BILITOT 0.7  PROT 7.9  ALBUMIN  4.1   Recent Labs  Lab 05/03/24 2120  LIPASE 113*    No results for input(s): AMMONIA in the last 168 hours. Coagulation Profile: No results for input(s): INR, PROTIME in the last 168 hours. Cardiac Enzymes: Recent Labs  Lab 05/03/24 2120  CKTOTAL 8,488*   BNP (last 3 results) No results for input(s): PROBNP in the last 8760 hours. HbA1C: No results for input(s): HGBA1C in the last 72 hours. CBG: No results for input(s): GLUCAP in the last 168 hours. Lipid Profile: No results for input(s):  CHOL, HDL, LDLCALC, TRIG, CHOLHDL, LDLDIRECT in the last 72 hours. Thyroid  Function Tests: No results for input(s): TSH, T4TOTAL, FREET4, T3FREE, THYROIDAB in the last 72 hours. Anemia Panel: No results for input(s): VITAMINB12, FOLATE, FERRITIN, TIBC, IRON, RETICCTPCT in the last 72 hours. Urine analysis:    Component Value Date/Time   COLORURINE YELLOW 05/12/2021 2034   APPEARANCEUR CLEAR 05/12/2021 2034   LABSPEC 1.010 05/12/2021 2034   PHURINE 6.0 05/12/2021 2034   GLUCOSEU NEGATIVE 05/12/2021 2034   HGBUR NEGATIVE 05/12/2021 2034   BILIRUBINUR NEGATIVE 05/12/2021 2034   BILIRUBINUR neg 01/06/2017 1156   KETONESUR NEGATIVE 05/12/2021 2034   PROTEINUR NEGATIVE 05/12/2021 2034   UROBILINOGEN 0.2 01/06/2017 1156   UROBILINOGEN 0.2 03/02/2015 1450   NITRITE NEGATIVE 05/12/2021 2034   LEUKOCYTESUR NEGATIVE 05/12/2021 2034   Sepsis Labs: @LABRCNTIP (procalcitonin:4,lacticidven:4) )No results found for this or any previous visit (from the past 240 hours).   Radiological Exams on Admission: CT Head Wo Contrast Result Date: 05/03/2024 EXAM: CT HEAD WITHOUT 05/03/2024 10:10:27 PM TECHNIQUE: CT of the head was performed without the administration of intravenous contrast. Automated exposure control, iterative reconstruction, and/or weight based adjustment of the mA/kV was utilized to reduce the radiation dose to as low as reasonably achievable. COMPARISON: 05/12/2021 CLINICAL HISTORY: Head trauma,  minor (Age >= 65y) FINDINGS: BRAIN AND VENTRICLES: No acute intracranial hemorrhage. No mass effect or midline shift. No extra-axial fluid collection. No evidence of acute infarct. No hydrocephalus. Atrophy and chronic small vessel disease throughout the deep white matter. ORBITS: No acute abnormality. SINUSES AND MASTOIDS: No acute abnormality. SOFT TISSUES AND SKULL: No acute skull fracture. No acute soft tissue abnormality. IMPRESSION: 1. No acute intracranial abnormality. 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 No fracture, subluxation, or dislocation. SOFT TISSUES: The soft tissues are unremarkable. IMPRESSION: 1. No evidence of acute traumatic injury. 2. 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 Electronically signed by: Franky Crease MD 05/03/2024 07:24 PM EST RP Workstation: [id number]    EKG: Independently reviewed. Sinus rhythm.   Assessment/Plan  1. Rhabdomyolysis  - Presents after unwitnessed fall and is found to have CK of 8488  - SCr is 1.46, up from 1.19 in 2023; UA not yet collected  - Check UA, continue IVF hydration, hold Crestor for now, monitor renal function and electrolytes    2. Right hip pain  - Patient having pain with any movement since her fall  - No acute findings noted on plain radiographs, will check CT  3. CKD 3A  - SCr is 1.49 in ED, up from 1.19 in 2023  - Renally-dose medications, monitor   4. Elevated troponin  - Troponin was 132 then 134  - She denies any recent chest pain and this is likely due to rhabdomyolysis    5. Dementia  - Use delirium precautions     6. Elevated AST  - AST is 129 in ED with LFTs otherwise normal  - Likely d/t rhabdomyolysis, plan to hold Crestor for now and trend    DVT prophylaxis: sq heparin   Code Status: Full  Level of Care: Level of care: Med-Surg Family Communication: Son updated  from ED   Disposition Plan:  Patient is from: Home  Anticipated d/c is to: TBD Anticipated d/c date is: 05/06/23 Patient currently: Pending CT pelvis, stable renal function, disposition planning   Consults called: None  Admission status: Inpatient     Evalene GORMAN Sprinkles, MD [facility]  05/04/2024, 12:51 AM       [1]  Allergies Allergen Reactions   Lisinopril  Swelling    Swelling in mouth and throat   Penicillins Rash    Has patient had a PCN reaction causing immediate rash, facial/tongue/throat swelling, SOB or lightheadedness with hypotension: No Has patient had a PCN reaction causing severe rash involving mucus membranes or skin necrosis: No Has patient had a PCN reaction that required hospitalization No Has patient had a PCN reaction occurring within the last 10 years: No If all of the above answers are NO, then may proceed with Cephalosporin use.    Tramadol Hcl Rash   Tramadol Hcl Rash   "

## 2024-05-05 ENCOUNTER — Inpatient Hospital Stay (HOSPITAL_COMMUNITY)

## 2024-05-05 DIAGNOSIS — Y92009 Unspecified place in unspecified non-institutional (private) residence as the place of occurrence of the external cause: Secondary | ICD-10-CM | POA: Diagnosis not present

## 2024-05-05 DIAGNOSIS — M25551 Pain in right hip: Secondary | ICD-10-CM | POA: Diagnosis not present

## 2024-05-05 DIAGNOSIS — W19XXXA Unspecified fall, initial encounter: Secondary | ICD-10-CM

## 2024-05-05 LAB — URINALYSIS, COMPLETE (UACMP) WITH MICROSCOPIC
Bacteria, UA: NONE SEEN
Bilirubin Urine: NEGATIVE
Glucose, UA: NEGATIVE mg/dL
Ketones, ur: NEGATIVE mg/dL
Leukocytes,Ua: NEGATIVE
Nitrite: NEGATIVE
Protein, ur: 100 mg/dL — AB
Specific Gravity, Urine: 1.034 — ABNORMAL HIGH (ref 1.005–1.030)
pH: 5 (ref 5.0–8.0)

## 2024-05-05 LAB — COMPREHENSIVE METABOLIC PANEL WITH GFR
ALT: 33 U/L (ref 0–44)
AST: 201 U/L — ABNORMAL HIGH (ref 15–41)
Albumin: 3.1 g/dL — ABNORMAL LOW (ref 3.5–5.0)
Alkaline Phosphatase: 47 U/L (ref 38–126)
Anion gap: 10 (ref 5–15)
BUN: 38 mg/dL — ABNORMAL HIGH (ref 8–23)
CO2: 21 mmol/L — ABNORMAL LOW (ref 22–32)
Calcium: 8.3 mg/dL — ABNORMAL LOW (ref 8.9–10.3)
Chloride: 107 mmol/L (ref 98–111)
Creatinine, Ser: 1.32 mg/dL — ABNORMAL HIGH (ref 0.44–1.00)
GFR, Estimated: 41 mL/min — ABNORMAL LOW
Glucose, Bld: 96 mg/dL (ref 70–99)
Potassium: 3.9 mmol/L (ref 3.5–5.1)
Sodium: 138 mmol/L (ref 135–145)
Total Bilirubin: 0.7 mg/dL (ref 0.0–1.2)
Total Protein: 6.1 g/dL — ABNORMAL LOW (ref 6.5–8.1)

## 2024-05-05 LAB — CBC
HCT: 37.6 % (ref 36.0–46.0)
Hemoglobin: 12.7 g/dL (ref 12.0–15.0)
MCH: 27.4 pg (ref 26.0–34.0)
MCHC: 33.8 g/dL (ref 30.0–36.0)
MCV: 81.2 fL (ref 80.0–100.0)
Platelets: 146 K/uL — ABNORMAL LOW (ref 150–400)
RBC: 4.63 MIL/uL (ref 3.87–5.11)
RDW: 14.9 % (ref 11.5–15.5)
WBC: 7.4 K/uL (ref 4.0–10.5)
nRBC: 0 % (ref 0.0–0.2)

## 2024-05-05 LAB — PHOSPHORUS: Phosphorus: 2.8 mg/dL (ref 2.5–4.6)

## 2024-05-05 LAB — RESP PANEL BY RT-PCR (RSV, FLU A&B, COVID)  RVPGX2
Influenza A by PCR: NEGATIVE
Influenza B by PCR: NEGATIVE
Resp Syncytial Virus by PCR: NEGATIVE
SARS Coronavirus 2 by RT PCR: POSITIVE — AB

## 2024-05-05 LAB — CK: Total CK: 11762 U/L — ABNORMAL HIGH (ref 38–234)

## 2024-05-05 LAB — MAGNESIUM: Magnesium: 2.4 mg/dL (ref 1.7–2.4)

## 2024-05-05 MED ORDER — SODIUM CHLORIDE 0.9 % IV SOLN: INTRAVENOUS | Status: AC

## 2024-05-05 NOTE — Plan of Care (Signed)

## 2024-05-05 NOTE — Progress Notes (Signed)
 " PROGRESS NOTE    [name]  FMW:[id number] DOB: 1946-02-19 DOA: 05/03/2024 PCP: [facility]  Chief Complaint  Patient presents with   [name]    Brief Narrative:   [name] is [name] 79 y.o. female with medical history significant for dementia, hypertension, and hyperlipidemia who presents with right hip pain after an unwitnessed fall at home.   Assessment & Plan:   Principal Problem:   Rhabdomyolysis Active Problems:   Essential hypertension   CKD stage 3a, GFR 45-59 ml/min (HCC)   Dementia (HCC)   Elevated troponin   Elevated transaminase level  COVID 19 Infection Fever Fever suspected in setting of COVID 19 infection Could have contributed to fall with generalized weakness O2 sat generally above 95%, will hold off on any antivirals at this time If recurrent fever, will collect blood cultures - afebrile, low suspicion for bacterial illness  Rhabdomyolysis UA with hemoglobin, 0-5 RBC - suggestive of myoglobinuria Continue IVF, will trend CK  Right Hip Pain At home using walker at times and cane at times She refused most of my lower extremity exam Didn't participate much with therapy either due to pain -  Imaging so far generally reassuring - CT pelvis with severe L hip arthrosis, R knee CT notable for s/p R total knee arthroplasty, small to moderate knee effusion, well seated hardware with normal alignment Will get femur plain films, foot/ankle plain films on R  Elevated Troponin Not c/w ACS EKG reassuring Possibly related to rhabdo  Forbes on CKD IIIa Trend  Dementia Delirium precautions  Delevated AST In setting of rhabdo   Obesity Body mass index is 47.12 kg/m.    DVT prophylaxis: heparin  Code Status: full Family Communication: son, Mr. Neils over phone Disposition:   Status is: Inpatient Remains inpatient appropriate because: need for continued inpatient care   Consultants:  none  Procedures:  none  Antimicrobials:   Anti-infectives (From admission, onward)    None       Subjective: C/o LE pain Per son, she was laying on R side - almost in [place]erman split - he was worried about her having [name] injury to her leg  Objective: Vitals:   05/05/24 0533 05/05/24 0634 05/05/24 0820 05/05/24 1211  BP:   121/68 (!) 141/67  Pulse:   72 73  Resp:   18 18  Temp: (!) 101 F (38.3 C) 100.3 F (37.9 C) 98.9 F (37.2 C) 97.7 F (36.5 C)  TempSrc: Oral  Oral Oral  SpO2:   93% 97%  Weight:      Height:        Intake/Output Summary (Last 24 hours) at 05/05/2024 1906 Last data filed at 05/05/2024 0540 Gross per 24 hour  Intake --  Output 500 ml  Net -500 ml   Filed Weights   05/03/24 1735  Weight: 120.7 kg    Examination:  General exam: Appears calm and comfortable - refuses basically any msk exam Respiratory system: unlabored Cardiovascular system: RRR Gastrointestinal system: Abdomen is nondistended, soft and nontender. Central nervous system: Alert and oriented. No focal neurological deficits. Extremities: she refuses much of the MSK exam due to discomfort - c/o L hip pain with evaluation of hip stability.  But doesn't let me move LE's much at all, limited exam.     Data Reviewed: I have personally reviewed following labs and imaging studies  CBC: Recent Labs  Lab 05/03/24 2120 05/04/24 0235 05/05/24 0213  WBC 6.1 6.0 7.4  NEUTROABS 4.9  --   --  HGB 14.7 15.5* 12.7  HCT 44.6 47.4* 37.6  MCV 82.1 82.3 81.2  PLT 161 137* 146*    Basic Metabolic Panel: Recent Labs  Lab 05/03/24 2120 05/04/24 0235 05/05/24 0213  NA 141 139 138  K 4.1 4.0 3.9  CL 103 103 107  CO2 23 22 21*  GLUCOSE 148* 120* 96  BUN 34* 37* 38*  CREATININE 1.46* 1.58* 1.32*  CALCIUM 9.6 9.4 8.3*  MG  --  2.5* 2.4  PHOS  --  3.9 2.8    GFR: Estimated Creatinine Clearance: 44.9 mL/min (Haleemah Buckalew) (by C-G formula based on SCr of 1.32 mg/dL (H)).  Liver Function Tests: Recent Labs  Lab 05/03/24 2120 05/04/24 0235  05/05/24 0213  AST 129* 178* 201*  ALT 21 27 33  ALKPHOS 64 59 47  BILITOT 0.7 0.8 0.7  PROT 7.9 7.3 6.1*  ALBUMIN  4.1 3.9 3.1*    CBG: No results for input(s): GLUCAP in the last 168 hours.   Recent Results (from the past 240 hours)  Resp panel by RT-PCR (RSV, Flu Lucyle Alumbaugh&B, Covid) Anterior Nasal Swab     Status: Abnormal   Collection Time: 05/05/24  7:40 AM   Specimen: Anterior Nasal Swab  Result Value Ref Range Status   SARS Coronavirus 2 by RT PCR POSITIVE (Latiqua Daloia) NEGATIVE Final   Influenza Samiah Ricklefs by PCR NEGATIVE NEGATIVE Final   Influenza B by PCR NEGATIVE NEGATIVE Final    Comment: (NOTE) The Xpert Xpress SARS-CoV-2/FLU/RSV plus assay is intended as an aid in the diagnosis of influenza from Nasopharyngeal swab specimens and should not be used as Nahshon Reich sole basis for treatment. Nasal washings and aspirates are unacceptable for Xpert Xpress SARS-CoV-2/FLU/RSV testing.  Fact Sheet for Patients: bloggercourse.com  Fact Sheet for Healthcare Providers: seriousbroker.it  This test is not yet approved or cleared by the [place]  FDA and has been authorized for detection and/or diagnosis of SARS-CoV-2 by FDA under an Emergency Use Authorization (EUA). This EUA will remain in effect (meaning this test can be used) for the duration of the COVID-19 declaration under Section 564(b)(1) of the Act, 21 U.S.C. section 360bbb-3(b)(1), unless the authorization is terminated or revoked.     Resp Syncytial Virus by PCR NEGATIVE NEGATIVE Final    Comment: (NOTE) Fact Sheet for Patients: bloggercourse.com  Fact Sheet for Healthcare Providers: seriousbroker.it  This test is not yet approved or cleared by the [place]  FDA and has been authorized for detection and/or diagnosis of SARS-CoV-2 by FDA under an Emergency Use Authorization (EUA). This EUA will remain in effect (meaning this test can  be used) for the duration of the COVID-19 declaration under Section 564(b)(1) of the Act, 21 U.S.C. section 360bbb-3(b)(1), unless the authorization is terminated or revoked.  Performed at [facility], [address] [place], [place] [address]          Radiology Studies: CT KNEE RIGHT WO CONTRAST Result Date: 05/04/2024 CLINICAL DATA:  Pain after fall. EXAM: CT OF THE RIGHT KNEE WITHOUT CONTRAST TECHNIQUE: Multidetector CT imaging of the right knee was performed according to the standard protocol. Multiplanar CT image reconstructions were also generated. RADIATION DOSE REDUCTION: This exam was performed according to the departmental dose-optimization program which includes automated exposure control, adjustment of the mA and/or kV according to patient size and/or use of iterative reconstruction technique. COMPARISON:  None Available. FINDINGS: Bones/Joint/Cartilage Status post right total knee arthroplasty with associated streak artifact which limits evaluation of the surrounding bone and soft tissues.  Hardware appears well seated with normal alignment. No significant periprosthetic lucency. No acute fracture identified. Small to moderate-sized knee joint effusion. Sclerotic appearance of the patella. Enthesopathy at the superior patellar pole. Muscles and Tendons No appreciable acute abnormality.  No intramuscular collection. Soft tissue No fluid collection or hematoma. IMPRESSION: 1. Status post right total knee arthroplasty with associated streak artifact which limits evaluation of the surrounding bone and soft tissues. Hardware appears well seated with normal alignment. No acute fracture identified. 2.  Small to moderate-sized knee joint effusion. 3. Sclerotic appearance of the patella. Enthesopathy at the superior patellar pole. Electronically Signed   By: Harrietta Sherry M.D.   On: 05/04/2024 12:38   CT PELVIS WO CONTRAST Result Date: 05/04/2024 EXAM: CT Pelvis, Without IV CONTRAST  05/04/2024 01:31:23 AM TECHNIQUE: Axial images were acquired through the pelvis without IV contrast. Reformatted images were reviewed. Automated exposure control, iterative reconstruction, and/or weight based adjustment of the mA/kV was utilized to reduce the radiation dose to as low as reasonably achievable. COMPARISON: AP pelvis and right hip series 05/03/2024, AP pelvis with left hip series 12/09/2023, and CT abdomen and pelvis with contrast 10/08/2016. CLINICAL HISTORY: Hip trauma, fracture suspected, xray done. FINDINGS: BONES: There is no evidence of fractures of the visualized lower lumbar spine, sacrum, coccyx, pelvis and proximal femurs. Lumbar dorsal fusion hardware is partially visualized with laminectomy at L4 and Chanette Demo grade 2 chronic L4-L5 anterolisthesis. Mild osteopenia. JOINTS: No dislocation. Noted is severe bone-on-bone left hip arthrosis with multiple subchondral cysts of the acetabulum and femoral head, moderate osteophytosis. This was not seen in 2018 or on Eustace Hur hip series from 2022 and could be related to an inflammatory arthropathy or Minoru Chap burned-out septic arthropathy. There is no substantial joint effusion. There are mild features of arthrosis in the right hip, mild pelvic and trochanteric enthesopathy, and slight spurring at the symphysis and SI joints. SOFT TISSUES: There is Shakiara Lukic small umbilical fat hernia. INTRAPELVIC CONTENTS: Pelvic phleboliths. Sigmoid diverticulosis without acute inflammatory change. There is no pelvic fluid collection, mass or adenopathy. The uterus is surgically absent. No adnexal mass is seen. IMPRESSION: 1. No acute osseous abnormality. 2. Severe bone-on-bone left hip arthrosis with juxtaarticular cystic changes, new since 2018 and not seen on 2022 hip radiographs, which could be related to an inflammatory arthropathy or Hinley Brimage burned-out septic arthropathy. No notable changes seen between 11/2023 and now. 3. Osteopenia and additional degenerative and postsurgical changes.  Electronically signed by: Francis Quam MD 05/04/2024 01:55 AM EST RP Workstation: [id number]   CT Head Wo Contrast Result Date: 05/03/2024 EXAM: CT HEAD WITHOUT 05/03/2024 10:10:27 PM TECHNIQUE: CT of the head was performed without the administration of intravenous contrast. Automated exposure control, iterative reconstruction, and/or weight based adjustment of the mA/kV was utilized to reduce the radiation dose to as low as reasonably achievable. COMPARISON: 05/12/2021 CLINICAL HISTORY: Head trauma, minor (Age >= 65y) FINDINGS: BRAIN AND VENTRICLES: No acute intracranial hemorrhage. No mass effect or midline shift. No extra-axial fluid collection. No evidence of acute infarct. No hydrocephalus. Atrophy and chronic small vessel disease throughout the deep white matter. ORBITS: No acute abnormality. SINUSES AND MASTOIDS: No acute abnormality. SOFT TISSUES AND SKULL: No acute skull fracture. No acute soft tissue abnormality. IMPRESSION: 1. No acute intracranial abnormality. Electronically signed by: Kevin Dover MD 05/03/2024 10:13 PM EST RP Workstation: [id number]        Scheduled Meds:  heparin   5,000 Units Subcutaneous Q8H   Continuous Infusions:   LOS: 1 day  Time spent: over 30 min     Meliton Monte, MD [facility]   To contact the attending provider between 7A-7P or the covering provider during after hours 7P-7A, please log into the web site [email or web address] and access using universal [place] password for that web site. If you do not have the password, please call the hospital operator.  05/05/2024, 7:06 PM    "

## 2024-05-05 NOTE — Progress Notes (Signed)
" °  °  Durable Medical Equipment  (From admission, onward)           Start     Ordered   05/05/24 1036  For home use only DME Bedside commode  Once       Comments: Please provide a 3:1 since patient is unable to ambulate to bathroom in the home.  Question Answer Comment  Patient needs a bedside commode to treat with the following condition Rhabdomyolysis   Patient needs a bedside commode to treat with the following condition Physical deconditioning      05/05/24 1036   05/05/24 1032  For home use only DME 4 wheeled rolling walker with seat  Once       Question Answer Comment  Patient needs a walker to treat with the following condition Rhabdomyolysis   Patient needs a walker to treat with the following condition Physical deconditioning      05/05/24 1036            "

## 2024-05-05 NOTE — Progress Notes (Addendum)
 Transition of Care [facility]) - Inpatient Brief Assessment   Patient Details  Name: [name] MRN: [id number] Date of Birth: May 26, 1945  Transition of Care [employer]) CM/SW Contact:    Rosaline JONELLE Joe, RN Phone Number: 05/05/2024, 10:39 AM   Clinical Narrative: CM met with the patient's son and patient lives with son in the home and plans to return home when medically stable.  The patient has RW at the home that is old.  The son would like rolator and 3:1 for the home.  I asked MD to place PT evaluation.  DMe orders for Rolator and 3:1.  The son states that home health was recently discharged from the home and son would like return of services if possible.  Son states that he does not have a preference for home health agency.  Patient was faxed out in the hub - pending PT eval.  Son will be provided with list of private pay private duty agencies for aides in the home as well since son requested.  RNCM will continue to follow the patient as patient progresses on the unit.  05/05/2024 1122 - Son was provided with list of private duty home health agencies that provide custodial care in the home.  Son states that he would likely not hire someone due to the cost at this time.  The son would like home health set up and did not have a preference for agency.  [facility] was called for home health services for PT.    Transition of Care Asessment: Insurance and Status: (P) Insurance coverage has been reviewed Patient has primary care physician: (P) Yes Home environment has been reviewed: (P) from home with son Prior level of function:: (P) family assistance Prior/Current Home Services: (P) No current home services Social Drivers of Health Review: (P) SDOH reviewed needs interventions Readmission risk has been reviewed: (P) Yes Transition of care needs: (P) transition of care needs identified, TOC will continue to follow

## 2024-05-05 NOTE — Evaluation (Signed)
 Physical Therapy Evaluation Patient Details Name: [name] MRN: [id number] DOB: Apr 24, 1946 Today's Date: 05/05/2024  History of Present Illness  79 y.o. female [facility] with R hip pain after unwitnessed fall. Pt was on the ground for <1 hrs with rhabdomyolysis and AKI. Imaging negative, COVID+. PMHx: dementia, hypertension, and hyperlipidemia  Clinical Impression  History taken from phone call with pt's son as pt was an unreliable historian. PTA, pt would ambulate with either SP cane or use of RW. History of at least two falls with pt unable to get off the ground without EMS assist. Difficult to fully assess patient as she would cry out with attempts at mobility stating Please God help me. Pt was able to move L ankle and toes with pt not attempting any other movement and declining PROM. Pt did not attempt to move R LE due to pain. Attempted to roll to the left with pt refusing further mobility after initiation.Pt has intermittent assist available at home. Discussed recommendation for <3hrs post acute rehab with pt's son in agreement. Will continue to follow acutely to reduce caregiver burden and improve mobility.         If plan is discharge home, recommend the following: A lot of help with walking and/or transfers;A lot of help with bathing/dressing/bathroom;Assistance with cooking/housework;Assist for transportation;Help with stairs or ramp for entrance;Direct supervision/assist for financial management;Direct supervision/assist for medications management   Can travel by private vehicle   No    Equipment Recommendations BSC/3in1;Wheelchair (measurements PT);Wheelchair cushion (measurements PT);Hoyer lift     Functional Status Assessment Patient has had a recent decline in their functional status and demonstrates the ability to make significant improvements in function in a reasonable and predictable amount of time.     Precautions / Restrictions  Precautions Precautions: Fall Recall of Precautions/Restrictions: Impaired Restrictions Weight Bearing Restrictions Per Provider Order: No      Mobility  Bed Mobility Overal bed mobility: Needs Assistance Bed Mobility: Rolling Rolling: Total assist, Used rails    General bed mobility comments: Pt able to grab onto left rail to initiate roll. Attempted to guide R hand towards rail with pt crying out and requesting to stop. Pt declined further movement      Balance Overall balance assessment: Needs assistance, History of Falls       Pertinent Vitals/Pain Pain Assessment Pain Assessment: Faces Faces Pain Scale: Hurts whole lot Pain Location: BLE's and R arm with movement Pain Descriptors / Indicators: Aching, Discomfort Pain Intervention(s): Limited activity within patient's tolerance, Monitored during session, Repositioned    Home Living Family/patient expects to be discharged to:: Private residence Living Arrangements: Children (son) Available Help at Discharge: Family;Available PRN/intermittently Type of Home: Apartment Home Access: Stairs to enter Entrance Stairs-Rails: None Entrance Stairs-Number of Steps: 2   Home Layout: One level Home Equipment: [employer] (2 wheels);Cane - single point Additional Comments: Hx taken from son via phone call. Pt is an unreliable historian    Prior Function Prior Level of Function : History of Falls (last six months);Needs assist   Mobility Comments: ModI with cane or RW. Has had two falls with pt unable to get up off the ground ADLs Comments: needs cues from son to bathe     Extremity/Trunk Assessment   Upper Extremity Assessment Upper Extremity Assessment: Defer to OT evaluation    Lower Extremity Assessment Lower Extremity Assessment: RLE deficits/detail;LLE deficits/detail RLE Deficits / Details: pt declining active movement and PROM. No spontaneous movement noted RLE Sensation:  WNL LLE Deficits / Details: able to  perform ankle DF/PF and wiggle toes. Refused other PROM or active movement LLE Sensation: WNL       Communication   Communication Communication: No apparent difficulties    Cognition Arousal: Alert Behavior During Therapy: WFL for tasks assessed/performed   PT - Cognitive impairments: History of cognitive impairments, No family/caregiver present to determine baseline    PT - Cognition Comments: Would repeatedly state Please God help me when attempting to mobilize Following commands: Impaired Following commands impaired: Follows one step commands inconsistently, Follows one step commands with increased time     Cueing Cueing Techniques: Verbal cues, Tactile cues, Visual cues      PT Assessment Patient needs continued PT services  PT Problem List Decreased strength;Decreased range of motion;Decreased activity tolerance;Decreased balance;Decreased mobility;Decreased cognition;Decreased knowledge of use of DME;Decreased safety awareness       PT Treatment Interventions DME instruction;Gait training;Functional mobility training;Therapeutic activities;Therapeutic exercise;Balance training;Neuromuscular re-education;Patient/family education    PT Goals (Current goals can be found in the Care Plan section)  Acute Rehab PT Goals Patient Stated Goal: to feel better PT Goal Formulation: With patient/family Time For Goal Achievement: 05/19/24 Potential to Achieve Goals: Fair    Frequency Min 2X/week        AM-PAC PT 6 Clicks Mobility  Outcome Measure Help needed turning from your back to your side while in a flat bed without using bedrails?: Total Help needed moving from lying on your back to sitting on the side of a flat bed without using bedrails?: Total Help needed moving to and from a bed to a chair (including a wheelchair)?: Total Help needed standing up from a chair using your arms (e.g., wheelchair or bedside chair)?: Total Help needed to walk in hospital room?:  Total Help needed climbing 3-5 steps with a railing? : Total 6 Click Score: 6    End of Session   Activity Tolerance: Patient limited by pain Patient left: in bed;with call bell/phone within reach;with bed alarm set Nurse Communication: Mobility status PT Visit Diagnosis: Unsteadiness on feet (R26.81);Other abnormalities of gait and mobility (R26.89);Muscle weakness (generalized) (M62.81);History of falling (Z91.81)    Time: 8445-8385 PT Time Calculation (min) (ACUTE ONLY): 20 min   Charges:   PT Evaluation $PT Eval Low Complexity: 1 Low   PT General Charges $$ ACUTE PT VISIT: 1 Visit       Kate ORN, PT, DPT Secure Chat Preferred  Rehab Office [phone]  Kate BRAVO Wendolyn 05/05/2024, 4:40 PM

## 2024-05-06 ENCOUNTER — Inpatient Hospital Stay (HOSPITAL_COMMUNITY)

## 2024-05-06 DIAGNOSIS — M25551 Pain in right hip: Secondary | ICD-10-CM

## 2024-05-06 LAB — CBC WITH DIFFERENTIAL/PLATELET
Abs Immature Granulocytes: 0.03 K/uL (ref 0.00–0.07)
Basophils Absolute: 0 K/uL (ref 0.0–0.1)
Basophils Relative: 0 %
Eosinophils Absolute: 0.2 K/uL (ref 0.0–0.5)
Eosinophils Relative: 2 %
HCT: 34.2 % — ABNORMAL LOW (ref 36.0–46.0)
Hemoglobin: 11 g/dL — ABNORMAL LOW (ref 12.0–15.0)
Immature Granulocytes: 0 %
Lymphocytes Relative: 18 %
Lymphs Abs: 1.5 K/uL (ref 0.7–4.0)
MCH: 27.2 pg (ref 26.0–34.0)
MCHC: 32.2 g/dL (ref 30.0–36.0)
MCV: 84.7 fL (ref 80.0–100.0)
Monocytes Absolute: 0.9 K/uL (ref 0.1–1.0)
Monocytes Relative: 11 %
Neutro Abs: 5.6 K/uL (ref 1.7–7.7)
Neutrophils Relative %: 69 %
Platelets: 128 K/uL — ABNORMAL LOW (ref 150–400)
RBC: 4.04 MIL/uL (ref 3.87–5.11)
RDW: 14.9 % (ref 11.5–15.5)
WBC: 8.2 K/uL (ref 4.0–10.5)
nRBC: 0 % (ref 0.0–0.2)

## 2024-05-06 LAB — COMPREHENSIVE METABOLIC PANEL WITH GFR
ALT: 32 U/L (ref 0–44)
AST: 139 U/L — ABNORMAL HIGH (ref 15–41)
Albumin: 2.9 g/dL — ABNORMAL LOW (ref 3.5–5.0)
Alkaline Phosphatase: 42 U/L (ref 38–126)
Anion gap: 8 (ref 5–15)
BUN: 32 mg/dL — ABNORMAL HIGH (ref 8–23)
CO2: 23 mmol/L (ref 22–32)
Calcium: 8.1 mg/dL — ABNORMAL LOW (ref 8.9–10.3)
Chloride: 107 mmol/L (ref 98–111)
Creatinine, Ser: 1.33 mg/dL — ABNORMAL HIGH (ref 0.44–1.00)
GFR, Estimated: 41 mL/min — ABNORMAL LOW
Glucose, Bld: 95 mg/dL (ref 70–99)
Potassium: 4 mmol/L (ref 3.5–5.1)
Sodium: 138 mmol/L (ref 135–145)
Total Bilirubin: 0.5 mg/dL (ref 0.0–1.2)
Total Protein: 5.8 g/dL — ABNORMAL LOW (ref 6.5–8.1)

## 2024-05-06 LAB — PHOSPHORUS: Phosphorus: 2.2 mg/dL — ABNORMAL LOW (ref 2.5–4.6)

## 2024-05-06 LAB — C-REACTIVE PROTEIN: CRP: 28.1 mg/dL — ABNORMAL HIGH

## 2024-05-06 LAB — MAGNESIUM: Magnesium: 2.3 mg/dL (ref 1.7–2.4)

## 2024-05-06 LAB — SEDIMENTATION RATE: Sed Rate: 76 mm/h — ABNORMAL HIGH (ref 0–22)

## 2024-05-06 LAB — CK: Total CK: 7478 U/L — ABNORMAL HIGH (ref 38–234)

## 2024-05-06 MED ORDER — ACETAMINOPHEN 500 MG PO TABS
1000.0000 mg | ORAL_TABLET | Freq: Three times a day (TID) | ORAL | Status: AC
  Administered 2024-05-06 – 2024-05-13 (×19): 1000 mg via ORAL
  Filled 2024-05-06 (×21): qty 2

## 2024-05-06 MED ORDER — MORPHINE SULFATE (PF) 2 MG/ML IV SOLN
1.0000 mg | INTRAVENOUS | Status: DC | PRN
  Administered 2024-05-07 – 2024-05-10 (×5): 1 mg via INTRAVENOUS
  Filled 2024-05-06 (×5): qty 1

## 2024-05-06 MED ORDER — ACETAMINOPHEN 325 MG PO TABS: 650.0000 mg | ORAL_TABLET | Freq: Four times a day (QID) | ORAL | Status: DC | PRN

## 2024-05-06 NOTE — Progress Notes (Signed)
 Patient refused hygiene and repositioning.

## 2024-05-06 NOTE — Progress Notes (Addendum)
 " PROGRESS NOTE    [name]  FMW:[id number] DOB: 1946-04-19 DOA: 05/03/2024 PCP: [facility]  Chief Complaint  Patient presents with   [name]    Brief Narrative:   [name] is [name] 79 y.o. female with medical history significant for dementia, hypertension, and hyperlipidemia who presents with right hip pain after an unwitnessed fall at home.   Assessment & Plan:   Principal Problem:   Rhabdomyolysis Active Problems:   Essential hypertension   CKD stage 3a, GFR 45-59 ml/min (HCC)   Dementia (HCC)   Elevated troponin   Elevated transaminase level   Fall at home  COVID 19 Infection Fever Fever suspected in setting of COVID 19 infection Could have contributed to fall with generalized weakness O2 sat generally above 95%, will hold off on any antivirals at this time Blood cultures pending collection Significantly elevated inflammatory markers - related to covid? 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  Foot and ankle films with plantar calcaneal spur, pplantar calcaeal spur with adjacent fragmentation, possible plantar fasciitis (see CT). CT R ankle with her continued pain    Elevated Troponin Not c/w ACS EKG reassuring Possibly related to rhabdo  AKI on CKD IIIa Trend  Dementia Delirium  precautions  Delevated AST In setting of rhabdo   Obesity Body mass index is 47.12 kg/m.    DVT prophylaxis: heparin  Code Status: full Family Communication: son, Mr. Antonopoulos 1/8 in hall before I saw his mother - called 1/8 pm to update, but no answer Disposition:   Status is: Inpatient Remains inpatient appropriate because: need for continued inpatient care   Consultants:  none  Procedures:  none  Antimicrobials:  Anti-infectives (From admission, onward)    None       Subjective: She localizes her pain most to her R ankle  Objective: Vitals:   05/06/24 0036 05/06/24 0521 05/06/24 0849 05/06/24 0853  BP: 129/67 118/71 137/79 137/79  Pulse: 87 80 82 87  Resp:   18 18  Temp:   (!) 101.3 F (38.5 C) (!) 101.3 F (38.5 C)  TempSrc:   Oral   SpO2: 100% 99% 100% 100%  Weight:      Height:        Intake/Output Summary (Last 24 hours) at 05/06/2024 1116 Last data filed at 05/06/2024 0530 Gross per 24 hour  Intake 977.8 ml  Output 400 ml  Net 577.8 ml   Filed Weights   05/03/24 1735  Weight: 120.7 kg    Examination:  General: No acute distress. Cardiovascular: RRR Lungs: unlabored Abdomen: Soft, nontender, nondistended  Neurological: Alert and oriented 3. Not moving legs due to pain. Skin: Warm and dry. No rashes or lesions. Extremities: R ankle swelling - again she refuses to allow [name] thorough exam - mild TTP to R ankle - no significant swelling of either extremity, soft  LE compartments     Data Reviewed: I have personally reviewed following labs and imaging studies  CBC: Recent Labs  Lab 05/03/24 2120 05/04/24 0235 05/05/24 0213 05/06/24 0258  WBC 6.1 6.0 7.4 8.2  NEUTROABS 4.9  --   --  5.6  HGB 14.7 15.5* 12.7 11.0*  HCT 44.6 47.4* 37.6 34.2*  MCV 82.1 82.3 81.2 84.7  PLT 161 137* 146* 128*    Basic Metabolic Panel: Recent Labs  Lab 05/03/24 2120 05/04/24 0235 05/05/24 0213 05/06/24 0258  NA 141 139 138 138  K 4.1 4.0 3.9 4.0   CL 103 103 107 107  CO2 23 22 21* 23  GLUCOSE 148* 120* 96 95  BUN 34* 37* 38* 32*  CREATININE 1.46* 1.58* 1.32* 1.33*  CALCIUM 9.6 9.4 8.3* 8.1*  MG  --  2.5* 2.4 2.3  PHOS  --  3.9 2.8 2.2*    GFR: Estimated Creatinine Clearance: 44.6 mL/min ([name]) (by C-G formula based on SCr of 1.33 mg/dL (H)).  Liver Function Tests: Recent Labs  Lab 05/03/24 2120 05/04/24 0235 05/05/24 0213 05/06/24 0258  AST 129* 178* 201* 139*  ALT 21 27 33 32  ALKPHOS 64 59 47 42  BILITOT 0.7 0.8 0.7 0.5  PROT 7.9 7.3 6.1* 5.8*  ALBUMIN  4.1 3.9 3.1* 2.9*    CBG: No results for input(s): GLUCAP in the last 168 hours.   Recent Results (from the past 240 hours)  Resp panel by RT-PCR (RSV, Flu [name], Covid) Anterior Nasal Swab     Status: Abnormal   Collection Time: 05/05/24  7:40 AM   Specimen: Anterior Nasal Swab  Result Value Ref Range Status   SARS Coronavirus 2 by RT PCR POSITIVE ([name]) NEGATIVE Final   Influenza [name] by PCR NEGATIVE NEGATIVE Final   Influenza B by PCR NEGATIVE NEGATIVE Final    Comment: (NOTE) The Xpert Xpress SARS-CoV-2/FLU/RSV plus assay is intended as an aid in the diagnosis of influenza from Nasopharyngeal swab specimens and should not be used as [name] sole basis for treatment. Nasal washings and aspirates are unacceptable for Xpert Xpress SARS-CoV-2/FLU/RSV testing.  Fact Sheet for Patients: bloggercourse.com  Fact Sheet for Healthcare Providers: seriousbroker.it  This test is not yet approved or cleared by the [place]  FDA and has been authorized for detection and/or diagnosis of SARS-CoV-2 by FDA under an Emergency Use Authorization (EUA). This EUA will remain in effect (meaning this test can be used) for the duration of the COVID-19 declaration under Section 564(b)(1) of the Act, 21 U.S.C. section 360bbb-3(b)(1), unless the authorization is terminated or revoked.     Resp Syncytial Virus by PCR NEGATIVE NEGATIVE  Final    Comment: (NOTE) Fact Sheet for Patients: bloggercourse.com  Fact Sheet for Healthcare Providers: seriousbroker.it  This test is not yet approved or cleared by the [place]  FDA and has been authorized for detection and/or diagnosis of SARS-CoV-2 by FDA under an Emergency Use Authorization (EUA). This EUA will remain in effect (meaning this test can be used) for the duration of the COVID-19 declaration under Section 564(b)(1) of the Act, 21 U.S.C. section 360bbb-3(b)(1), unless the authorization is terminated or revoked.  Performed at [facility], [address], [place], [place] [address]          Radiology Studies: DG Ankle 2 Views Right Result Date: 05/05/2024 EXAM: 2 VIEW(S) XRAY OF THE _LATERALITY_ ANKLE 05/05/2024 08:16:00 PM CLINICAL HISTORY: Pain COMPARISON: None available. FINDINGS: BONES AND JOINTS: No acute fracture.  No malalignment. Prominent plantar calcaneal spur. Fragmentation adjacent to the spur may represent plantar fasciitis. Tibiotalar and tibiofibular degenerative changes with osteophyte formation. Midfoot degenerative changes. SOFT TISSUES: Subcutaneous soft tissue edema. IMPRESSION: 1. Prominent plantar calcaneal spur with adjacent fragmentation, possibly representing plantar fasciitis. 2. Subcutaneous soft tissue edema. 3. Tibiotalar, tibiofibular, and midfoot degenerative changes with osteophyte formation. Electronically signed by: Elsie Gravely MD 05/05/2024 08:44 PM EST RP Workstation: [id number]   DG CHEST PORT 1 VIEW Result Date: 05/05/2024 EXAM: 1 VIEW(S) XRAY OF THE CHEST 05/05/2024 08:16:00 PM COMPARISON: Comparison with 05/12/2021. CLINICAL HISTORY: COVID FINDINGS: LUNGS AND PLEURA: Low lung volumes. No focal pulmonary opacity. No pleural effusion. No pneumothorax. HEART AND MEDIASTINUM: Mild cardiomegaly, likely accentuated by technique. Calcification of the aorta. BONES AND SOFT  TISSUES: Cervical fusion hardware. Partially imaged lumbar fusion hardware. Surgical clips in LEFT lower neck. Degenerative changes in the spine. No acute osseous abnormality. IMPRESSION: 1. No acute findings. 2. Mild cardiomegaly, likely accentuated by technique. Electronically signed by: Elsie Gravely MD 05/05/2024 08:43 PM EST RP Workstation: [id number]   DG FEMUR PORT, MIN 2 VIEWS RIGHT Result Date: 05/05/2024 EXAM: 2 VIEW(S) XRAY OF THE RIGHT FEMUR 05/05/2024 08:16:00 PM COMPARISON: Right hip 05/03/2024. CT right knee 05/04/2024. CLINICAL HISTORY: Pain. FINDINGS: BONES AND JOINTS: Right knee arthroplasty in place. Enthesophyte in the superior pole of the patella with fragmentation, possibly avulsion. Moderate right knee joint effusion. No malalignment. SOFT TISSUES: Unremarkable. IMPRESSION: 1. Enthesophyte at the superior pole of the patella with fragmentation, possibly representing avulsion injury. 2. Moderate right knee joint effusion. 3. Right knee arthroplasty in place. Electronically signed by: Elsie Gravely MD 05/05/2024 08:35 PM EST RP Workstation: [id number]   DG Foot 2 Views Right Result Date: 05/05/2024 EXAM: 1 or 2 VIEW(S) XRAY OF THE RIGHT FOOT 05/05/2024 08:16:00 PM COMPARISON: Right ankle 05/05/2024. CLINICAL HISTORY: Pain. FINDINGS: BONES AND JOINTS: No acute fracture. No malalignment. Large plantar calcaneal spur. Bone fragment adjacent to the spur may represent calcific tendinosis. Mild midfoot degenerative changes. SOFT TISSUES: Dorsal forefoot subcutaneous soft tissue edema. IMPRESSION: 1. Large plantar calcaneal spur with adjacent bone fragment, possibly representing calcific tendinosis. 2. Dorsal forefoot subcutaneous soft tissue edema. 3. Mild midfoot degenerative changes. Electronically signed by: Elsie Gravely MD 05/05/2024 08:25 PM EST RP Workstation: [id number]   CT KNEE RIGHT WO CONTRAST Result Date: 05/04/2024 CLINICAL DATA:  Pain after fall. EXAM: CT OF THE RIGHT KNEE  WITHOUT CONTRAST TECHNIQUE: Multidetector CT imaging of the right knee was performed according to the standard protocol. Multiplanar CT image reconstructions were also generated. RADIATION DOSE REDUCTION: This exam was performed according to the departmental dose-optimization program which includes automated exposure control, adjustment of the mA and/or kV according to patient size and/or use of iterative reconstruction technique. COMPARISON:  None Available. FINDINGS: Bones/Joint/Cartilage Status post right total knee arthroplasty with associated streak artifact which limits evaluation of the surrounding bone and soft tissues. Hardware appears well seated with normal alignment. No significant periprosthetic lucency. No acute fracture identified. Small to moderate-sized knee joint effusion. Sclerotic appearance of the patella. Enthesopathy at the superior patellar pole. Muscles and Tendons No appreciable acute abnormality.  No intramuscular collection. Soft tissue No fluid collection or hematoma. IMPRESSION: 1. Status post right total knee arthroplasty with associated streak artifact which limits evaluation of the surrounding bone and soft tissues. Hardware appears well seated with normal alignment. No acute fracture identified. 2.  Small to moderate-sized knee joint effusion. 3. Sclerotic appearance of the patella. Enthesopathy at the superior  patellar pole. Electronically Signed   By: Harrietta Sherry M.D.   On: 05/04/2024 12:38        Scheduled Meds:  heparin   5,000 Units Subcutaneous Q8H   Continuous Infusions:  sodium chloride  125 mL/hr at 05/06/24 0530     LOS: 2 days    Time spent: over 30 min     Meliton Monte, MD [facility]   To contact the attending provider between 7A-7P or the covering provider during after hours 7P-7A, please log into the web site [email or web address] and access using universal [place] password for that web site. If you do not have the password, please  call the hospital operator.  05/06/2024, 11:16 AM    "

## 2024-05-06 NOTE — Evaluation (Signed)
 Occupational Therapy Evaluation Patient Details Name: [name] MRN: [id number] DOB: 13-Oct-1945 Today's Date: 05/06/2024   History of Present Illness   79 y.o. female presents to [facility] 05/04/23 with R hip pain after unwitnessed fall. Pt was on the ground for <1 hrs with rhabdomyolysis and AKI. Imaging negative, COVID+. PMHx: dementia, hypertension, and hyperlipidemia     Clinical Impressions Per chart review, at baseline, pt performs ADLs Independent to Supervision and performs functional mobility Mod I with a RW or SPC. Pt with a history of falls. Pt now presents with decreased activity tolerance, impaired cognition (uncertain of pt's baseline), generalized B UE weakness, decreased B UE fine motor coordination, pain affecting functional level, and decreased safety and independence with functional tasks. Pt largely demonstrating ability to complete UB ADLs with Min to Max assist and LB ADLs with Total assist +2. Initiated roll in bed with pt requiring Total assist, but with pt quickly requesting to be returned to back due to pain and declining further mobility. VSS on RA. Pt participation limited this session by pain and current cognitive level. Pt requiring max encouragement this session for minimal participation. With increased participation, pt will benefit from acute skilled OT services to address deficits and increase safety and independence with functional tasks. Post acute discharge, pt will benefit from intensive inpatient skilled rehab services < 3 hours per day to maximize rehab potential.      If plan is discharge home, recommend the following:   Two people to help with walking and/or transfers;Assistance with cooking/housework;Two people to help with bathing/dressing/bathroom;Assistance with feeding;Direct supervision/assist for medications management;Direct supervision/assist for financial management;Assist for transportation;Help with stairs or ramp for entrance;Supervision due to  cognitive status     Functional Status Assessment   Patient has had a recent decline in their functional status and demonstrates the ability to make significant improvements in function in a reasonable and predictable amount of time.     Equipment Recommendations   BSC/3in1;Tub/shower bench;Wheelchair (measurements OT);Wheelchair cushion (measurements OT)     Recommendations for Other Services         Precautions/Restrictions   Precautions Precautions: Fall Recall of Precautions/Restrictions: Impaired Restrictions Weight Bearing Restrictions Per Provider Order: No     Mobility Bed Mobility Overal bed mobility: Needs Assistance Bed Mobility: Rolling Rolling: Total assist, Used rails         General bed mobility comments: Pt able to grab onto L rail to initiate roll; however, pt asking to stop prior to fully rolling onto side and declined further movement    Transfers                   General transfer comment: deferred this session      Balance Overall balance assessment: Needs assistance, History of Falls (to be further assessed in furture sessions)                                         ADL either performed or assessed with clinical judgement   ADL Overall ADL's : Needs assistance/impaired Eating/Feeding: Minimal assistance;Bed level;Cueing for safety Eating/Feeding Details (indicate cue type and reason): to drink from cup with straw; pt declined food and, per RN, has been declining all meals over the past day Grooming: Wash/dry hands;Wash/dry face;Cueing for sequencing;Bed level;Contact guard assist;Set up Grooming Details (indicate cue type and reason): with max encouragement and cues for thoroughness; suspect pt  would need incresaed assistance with oral care due to noted fine motor coordination deficits Upper Body Bathing: Maximal assistance;Bed level;Cueing for sequencing;Cueing for compensatory techniques   Lower Body  Bathing: Total assistance;+2 for safety/equipment;Bed level   Upper Body Dressing : Maximal assistance;Bed level;Cueing for sequencing   Lower Body Dressing: Total assistance;+2 for safety/equipment;Bed level     Toilet Transfer Details (indicate cue type and reason): deferred this session Toileting- Clothing Manipulation and Hygiene: Total assistance;+2 for safety/equipment;Bed level         General ADL Comments: Pt limited by pain and current cognitive level     Vision Patient Visual Report: Other (comment) (Pt unable to report) Additional Comments: Pt largely keeping eyes closed during session unless cues to open eyes. Vision appears [name] for tasks assessed with pt able to reach and grasp phone, bed rail, cup, and OT's hand in different positions.     Perception         Praxis         Pertinent Vitals/Pain Pain Assessment Pain Assessment: Faces Faces Pain Scale: Hurts whole lot Pain Location: B LEs and R shoulder with movement Pain Descriptors / Indicators: Aching, Discomfort, Guarding, Grimacing Pain Intervention(s): Limited activity within patient's tolerance, Monitored during session, Premedicated before session, Repositioned     Extremity/Trunk Assessment Upper Extremity Assessment Upper Extremity Assessment: RUE deficits/detail;LUE deficits/detail;Right hand dominant;Generalized weakness RUE Deficits / Details: generalized weakness; AROM shoulder flexion to approximately 70 degrees with pt reporting pain with movement and declining AAROM/PROM of shoulder; all other AROM WFL; decreased coordination; sensation WFL RUE: Shoulder pain with ROM RUE Sensation: WNL RUE Coordination: decreased fine motor;decreased gross motor LUE Deficits / Details: generalized weakness; AROM shoulder flexion to approximately 100 degrees with pt declining AAROM/PROM of shoulder to assess further; all other AROM WFL; decreased coordination; sensation WFL LUE Sensation: WNL LUE Coordination:  decreased fine motor;decreased gross motor   Lower Extremity Assessment Lower Extremity Assessment: RLE deficits/detail;LLE deficits/detail RLE Deficits / Details: pt declining active movement and PROM. No spontaneous movement noted LLE Deficits / Details: able to perform ankle DF/PF and wiggle toes. Refused other PROM or active movement       Communication Communication Communication: No apparent difficulties   Cognition Arousal: Alert, Lethargic (Pt asleep upon arrival, but woke easily) Behavior During Therapy: WFL for tasks assessed/performed Cognition: History of cognitive impairments, No family/caregiver present to determine baseline, Cognition impaired   Orientation impairments: Place, Time, Situation Awareness: Intellectual awareness intact, Online awareness impaired Memory impairment (select all impairments): Short-term memory, Working civil service fast streamer, Conservation officer, historic buildings Attention impairment (select first level of impairment): Selective attention (easily internally distracted) Executive functioning impairment (select all impairments): Organization, Sequencing, Reasoning, Problem solving OT - Cognition Comments: Pt oriented to self only and requiring max encouragement for minimal participation.                 Following commands: Impaired Following commands impaired: Follows one step commands inconsistently, Follows one step commands with increased time     Cueing  General Comments   Cueing Techniques: Verbal cues;Tactile cues;Visual cues  VSS on RA. After minimal participation from bed level, pt stating she wants to be left alone with OT respecting pt request and notifying RN of pt level of participation, accepting a few sips of water, and declining her meal.   Exercises     Shoulder Instructions      Home Living Family/patient expects to be discharged to:: Private residence Living Arrangements: Children (son) Available Help at Discharge:  Family;Available  PRN/intermittently Type of Home: Apartment Home Access: Stairs to enter Entrance Stairs-Number of Steps: 2 Entrance Stairs-Rails: None Home Layout: One level     Bathroom Shower/Tub: Chief Strategy Officer: Standard     Home Equipment: Agricultural Consultant (2 wheels);Cane - single point   Additional Comments: Home Living and Prior Function taken from chart review. Pt is an unreliable historian.      Prior Functioning/Environment Prior Level of Function : History of Falls (last six months);Needs assist             Mobility Comments: ModI with cane or RW. Has had two falls with pt unable to get up off the ground ADLs Comments: Needs Supervision and cues from son to bathe. Otherwise Ind to Mod I with ADLs. Suspect son assists with IADLs; however, pt is unable to report and no family caregiver available to confirm at this time.    OT Problem List: Decreased strength;Decreased activity tolerance;Decreased coordination;Decreased safety awareness;Decreased knowledge of precautions;Pain   OT Treatment/Interventions: Self-care/ADL training;Therapeutic exercise;DME and/or AE instruction;Therapeutic activities;Cognitive remediation/compensation;Patient/family education;Balance training      OT Goals(Current goals can be found in the care plan section)   Acute Rehab OT Goals Patient Stated Goal: To be left alone with RN notified OT Goal Formulation: Patient unable to participate in goal setting Time For Goal Achievement: 05/20/24 Potential to Achieve Goals: Fair ADL Goals Pt Will Perform Eating: with set-up;sitting (with adaptive equipment as needed) Pt Will Perform Grooming: with set-up;with supervision;sitting (sitting EOB with Fair balance for 3 or more minutes) Pt Will Perform Upper Body Dressing: with min assist;sitting Pt Will Transfer to Toilet: with mod assist;with +2 assist;ambulating;bedside commode (with least restrictive AD)   OT Frequency:  Min 2X/week     Co-evaluation              AM-PAC OT 6 Clicks Daily Activity     Outcome Measure Help from another person eating meals?: A Little Help from another person taking care of personal grooming?: A Little Help from another person toileting, which includes using toliet, bedpan, or urinal?: Total Help from another person bathing (including washing, rinsing, drying)?: A Lot Help from another person to put on and taking off regular upper body clothing?: A Lot Help from another person to put on and taking off regular lower body clothing?: Total 6 Click Score: 12   End of Session Nurse Communication: Mobility status;Other (comment) (Pt with impaired cognition, report of pain with movement, and limited participation. Pt declining meal, but accepting a few sips of water. Pt premedicated prior to session. OT POC.)  Activity Tolerance: Patient limited by pain;Other (comment) (Pt limited by current cognitive level) Patient left: in bed;with call bell/phone within reach;with bed alarm set  OT Visit Diagnosis: History of falling (Z91.81);Muscle weakness (generalized) (M62.81);Pain;Other symptoms and signs involving cognitive function                Time: 8343-8284 OT Time Calculation (min): 19 min Charges:  OT General Charges $OT Visit: 1 Visit OT Evaluation $OT Eval Low Complexity: 1 Low  [name] HERO., OTR/L, MA Acute Rehab [phone]   [name] 05/06/2024, 6:02 PM

## 2024-05-06 NOTE — Progress Notes (Signed)
 Transport for CT called RN due to patient refusal. Patient received pain med, per pt. She would go if she gets pain med. However patient had a hard time deciding, RN explained on what to expect for CT and it shouldn't take long, sliding board is used for easy patient transfer. Patient still refused.

## 2024-05-06 NOTE — Plan of Care (Signed)
  Problem: Education: Goal: Knowledge of General Education information will improve Description: Including pain rating scale, medication(s)/side effects and non-pharmacologic comfort measures Outcome: Progressing   Problem: Clinical Measurements: Goal: Ability to maintain clinical measurements within normal limits will improve Outcome: Progressing Goal: Will remain free from infection Outcome: Progressing Goal: Diagnostic test results will improve Outcome: Progressing Goal: Respiratory complications will improve Outcome: Progressing Goal: Cardiovascular complication will be avoided Outcome: Progressing   Problem: Activity: Goal: Risk for activity intolerance will decrease Outcome: Progressing   Problem: Elimination: Goal: Will not experience complications related to bowel motility Outcome: Progressing Goal: Will not experience complications related to urinary retention Outcome: Progressing

## 2024-05-06 NOTE — Plan of Care (Signed)
   Problem: Coping: Goal: Level of anxiety will decrease Outcome: Progressing   Problem: Pain Managment: Goal: General experience of comfort will improve and/or be controlled Outcome: Progressing

## 2024-05-07 ENCOUNTER — Inpatient Hospital Stay (HOSPITAL_COMMUNITY)

## 2024-05-07 DIAGNOSIS — M6282 Rhabdomyolysis: Secondary | ICD-10-CM

## 2024-05-07 LAB — URIC ACID: Uric Acid, Serum: 6.4 mg/dL (ref 2.5–7.1)

## 2024-05-07 LAB — COMPREHENSIVE METABOLIC PANEL WITH GFR
ALT: 28 U/L (ref 0–44)
AST: 98 U/L — ABNORMAL HIGH (ref 15–41)
Albumin: 2.4 g/dL — ABNORMAL LOW (ref 3.5–5.0)
Alkaline Phosphatase: 50 U/L (ref 38–126)
Anion gap: 10 (ref 5–15)
BUN: 26 mg/dL — ABNORMAL HIGH (ref 8–23)
CO2: 19 mmol/L — ABNORMAL LOW (ref 22–32)
Calcium: 8.1 mg/dL — ABNORMAL LOW (ref 8.9–10.3)
Chloride: 109 mmol/L (ref 98–111)
Creatinine, Ser: 0.99 mg/dL (ref 0.44–1.00)
GFR, Estimated: 59 mL/min — ABNORMAL LOW
Glucose, Bld: 117 mg/dL — ABNORMAL HIGH (ref 70–99)
Potassium: 3.8 mmol/L (ref 3.5–5.1)
Sodium: 138 mmol/L (ref 135–145)
Total Bilirubin: 0.5 mg/dL (ref 0.0–1.2)
Total Protein: 5.5 g/dL — ABNORMAL LOW (ref 6.5–8.1)

## 2024-05-07 LAB — PROCALCITONIN: Procalcitonin: 0.3 ng/mL

## 2024-05-07 LAB — CBC WITH DIFFERENTIAL/PLATELET
Abs Immature Granulocytes: 0.04 K/uL (ref 0.00–0.07)
Basophils Absolute: 0 K/uL (ref 0.0–0.1)
Basophils Relative: 0 %
Eosinophils Absolute: 0.1 K/uL (ref 0.0–0.5)
Eosinophils Relative: 1 %
HCT: 31.8 % — ABNORMAL LOW (ref 36.0–46.0)
Hemoglobin: 10.4 g/dL — ABNORMAL LOW (ref 12.0–15.0)
Immature Granulocytes: 1 %
Lymphocytes Relative: 13 %
Lymphs Abs: 1 K/uL (ref 0.7–4.0)
MCH: 26.8 pg (ref 26.0–34.0)
MCHC: 32.7 g/dL (ref 30.0–36.0)
MCV: 82 fL (ref 80.0–100.0)
Monocytes Absolute: 0.6 K/uL (ref 0.1–1.0)
Monocytes Relative: 8 %
Neutro Abs: 6 K/uL (ref 1.7–7.7)
Neutrophils Relative %: 77 %
Platelets: 143 K/uL — ABNORMAL LOW (ref 150–400)
RBC: 3.88 MIL/uL (ref 3.87–5.11)
RDW: 14.7 % (ref 11.5–15.5)
WBC: 7.7 K/uL (ref 4.0–10.5)
nRBC: 0 % (ref 0.0–0.2)

## 2024-05-07 LAB — CK: Total CK: 4307 U/L — ABNORMAL HIGH (ref 38–234)

## 2024-05-07 LAB — C-REACTIVE PROTEIN: CRP: 27.3 mg/dL — ABNORMAL HIGH

## 2024-05-07 NOTE — Progress Notes (Signed)
 Physical Therapy Treatment Patient Details Name: [name] MRN: [id number] DOB: [birth year] Today's Date: 05/07/2024   History of Present Illness 79 y.o. female presents to [facility] 05/04/23 with R hip pain after unwitnessed fall. Pt was on the ground for <1 hrs with rhabdomyolysis and AKI. Imaging negative, COVID+. PMHx: dementia, hypertension, and hyperlipidemia    PT Comments  Pt received in supine, in bed with soiled linens, pt appears oriented to self but not fully to situation/time/location, difficult to assess due to pt agitation once therapist assisting her to mobilize. Pt needing +3 assist from staff plus son in room to hold her hand while staff assisting her to roll to L/R sides for bed linen change. RN present to assess her skin and skin breakdown observed on sacrum (and possibly on buttocks?) RN placed foam dressing on sacrum while pt in sidelying. RN/MD notified that PTA recommending air mattress to reduce risk of further skin breakdown/pressure sores. Pt appears to c/o pain more severe in L foot/LE and L hip, but generalized c/o pain throughout. PTA offering ice pack for her hip if she would like. Son encouraging and attempting to keep pt calm during mobility, but pt not able to be calmed while rolling; per RN, pt has often been refusing PO intake/medications. Patient will benefit from continued inpatient follow up therapy, <3 hours/day.    If plan is discharge home, recommend the following: Assistance with cooking/housework;Assist for transportation;Help with stairs or ramp for entrance;Direct supervision/assist for financial management;Direct supervision/assist for medications management;Two people to help with walking and/or transfers;Two people to help with bathing/dressing/bathroom;Supervision due to cognitive status   Can travel by private vehicle     No  Equipment Recommendations  BSC/3in1;Wheelchair (measurements PT);Wheelchair cushion (measurements PT);Hoyer lift     Recommendations for [employer]       Precautions / Restrictions Precautions Precautions: Fall Recall of Precautions/Restrictions: Impaired Precaution/Restrictions Comments: Covid+ airborne precs Restrictions [employer] Bearing Restrictions Per Provider Order: No     Mobility  Bed Mobility Overal bed mobility: Needs Assistance Bed Mobility: Rolling Rolling: Total assist, Used rails, +2 for physical assistance (+3 assist)         General bed mobility comments: +3 assist to complete roll fully toward her L/R sides and to allow RN to place foam dressing on her sacral area where skin appears to be breaking down and pressure sore developing. Son present and assisting wtih holding her hand while pt in sidelying to encourage her not to strike at staff.    Transfers                   General transfer comment: Pt unable to perform due to pain/confusion    Ambulation/Gait                   Stairs             Wheelchair Mobility     Tilt Bed    Modified Rankin (Stroke Patients Only)       Balance Overall balance assessment: Needs assistance, History of Falls (to be further assessed in future sessions)                                          Communication Communication Communication: No apparent difficulties  Cognition Arousal: Alert Behavior During Therapy: Agitated, Impulsive, Lability   PT - Cognitive impairments: History of cognitive impairments,  Orientation, Awareness, Memory, Attention, Initiation, Sequencing, Problem solving, Safety/Judgement   Orientation impairments: Place, Time, Situation                   PT - Cognition Comments: Pt agitated, threatening to strike staff, son present and assisting to calm her, which only helped slightly, but he assisted to hold her hand while therapist rolling her with nursing staff present to assist with movements and to provide foam dressing due to developing pressure sore. Pt  son also has her daughter on speakerphone during session to encourage her/try to get her to cooperate. Following commands: Impaired Following commands impaired: Follows one step commands inconsistently, Follows one step commands with increased time    Cueing Cueing Techniques: Verbal cues, Tactile cues, Visual cues, Gestural cues  Exercises      General Comments General comments (skin integrity, edema, etc.): skin breakdown observed in sacral/bottom area while pt assisted to roll to L/R sides, RN called to room to assess and placed foam dressing while pt sidelying, PTA assisted to use water to wipe area and dry with wash cloth prior to pt rolling to L/R sides.      Pertinent Vitals/Pain Pain Assessment Pain Assessment: PAINAD Breathing: occasional labored breathing, short period of hyperventilation Negative Vocalization: repeated troubled calling out, loud moaning/groaning, crying Facial Expression: facial grimacing Body Language: rigid, fists clenched, knees up, pushing/pulling away, strikes out Consolability: unable to console, distract or reassure PAINAD Score: 9 Pain Location: BLE (L>R), bil shoulders, L>R foot when sock adjusted Pain Descriptors / Indicators: Aching, Discomfort, Guarding, Grimacing, Moaning Pain Intervention(s): Limited activity within patient's tolerance, Monitored during session, Repositioned, Patient requesting pain meds-RN notified, Utilized relaxation techniques, Ice applied, Other (comment) (pt agitated, RN/MD notified where pt seemed to be most complaining. Son attempting to calm her during session as well. per RN, she has been refusing pain meds/PO intake)    Home Living                          Prior Function            PT Goals (current goals can now be found in the care plan section) Acute Rehab PT Goals Patient Stated Goal: Per pt and family for pain to decrease, per son for her to cooperate and move more so she can get better PT Goal  Formulation: With patient/family Time For Goal Achievement: 05/19/24 Progress towards PT goals: Progressing toward goals (slowly)    Frequency    Min 2X/week      PT Plan      Co-evaluation              AM-PAC PT 6 Clicks Mobility   Outcome Measure  Help needed turning from your back to your side while in a flat bed without using bedrails?: Total Help needed moving from lying on your back to sitting on the side of a flat bed without using bedrails?: Total Help needed moving to and from a bed to a chair (including a wheelchair)?: Total Help needed standing up from a chair using your arms (e.g., wheelchair or bedside chair)?: Total Help needed to walk in hospital room?: Total Help needed climbing 3-5 steps with a railing? : Total 6 Click Score: 6    End of Session Equipment Utilized During Treatment: Other (comment) (bed pads) Activity Tolerance: Patient limited by pain;Treatment limited secondary to agitation Patient left: in bed;with call bell/phone within reach;with bed alarm set;with family/visitor  present;Other (comment);with nursing/sitter in room (son present as well as RN/NT in room to assist with gown change after session) Nurse Communication: Mobility status;Need for lift equipment;Precautions;Other (comment) (rec air bed, c/o pain in LLE>RLE and possibly L shoulder; MD notified also via secure chat; +3 assist at current time) PT Visit Diagnosis: Unsteadiness on feet (R26.81);Other abnormalities of gait and mobility (R26.89);Muscle weakness (generalized) (M62.81);History of falling (Z91.81)     Time: 1703-1730 PT Time Calculation (min) (ACUTE ONLY): 27 min  Charges:    $Therapeutic Activity: 23-37 mins PT General Charges $$ ACUTE PT VISIT: 1 Visit                     Graceanna Theissen P., PTA Acute Rehabilitation Services Secure Chat Preferred 9a-5:30pm Office: [phone]    Connell HERO [facility] 05/07/2024, 6:07 PM

## 2024-05-07 NOTE — Plan of Care (Signed)
  Problem: Clinical Measurements: Goal: Ability to maintain clinical measurements within normal limits will improve Outcome: Progressing   Problem: Nutrition: Goal: Adequate nutrition will be maintained Outcome: Progressing   Problem: Elimination: Goal: Will not experience complications related to bowel motility Outcome: Progressing   Problem: Pain Managment: Goal: General experience of comfort will improve and/or be controlled Outcome: Progressing   Problem: Safety: Goal: Ability to remain free from injury will improve Outcome: Progressing

## 2024-05-07 NOTE — Progress Notes (Signed)
 During rounds, staff attempted to turn, reposition, and check for incontinence and pt refused to turn stating, ''it  hurts, offered analgesics but refused. Will monitor and continue to assess, call bell in reach, bed alarms activated and audible

## 2024-05-07 NOTE — Progress Notes (Signed)
 " PROGRESS NOTE    [name]  FMW:[id number] DOB: 29-Mar-1946 DOA: 05/03/2024 PCP: [facility], [facility]  Chief Complaint  Patient presents with   [name]    Brief Narrative:   [name] is [name] 79 y.o. female with medical history significant for dementia, hypertension, and hyperlipidemia who presents with right hip pain after an unwitnessed fall at home.   Assessment & Plan:   Principal Problem:   Rhabdomyolysis Active Problems:   Essential hypertension   CKD stage 3a, GFR 45-59 ml/min (HCC)   Dementia (HCC)   Elevated troponin   Elevated transaminase level   Fall at home   Acute right hip pain  COVID 19 Infection Fever Fever suspected in setting of COVID 19 infection Could have contributed to fall with generalized weakness O2 sat generally above 95%, will hold off on any antivirals at this time Blood cultures pending collection Significantly elevated inflammatory markers - related to covid? Normal white count, lower suspicion for bacterial illness  Rhabdomyolysis UA with hemoglobin, 0-5 RBC - suggestive of myoglobinuria Continue IVF, will trend CK (improving)  Right Hip Pain Right Ankle Pain Lower Extremity Pain At home using walker at times and cane at times Refusing Sharryn Belding good portion of exam, difficult - today she threatened to throw remote at me.  When asked where her pain was today, gave inconsistent responses.  Was more TTP today to L knee. CT pelvis with severe bone on bone L hip arthrosis with juxtaarticular cystic changes  CT R knee with well seated hardware - small to moderate knee joint effusion Plain films of R knee and ankle with large plantar calcaneal spur with adjacent bone fragment, priminent plantar calcaneal spur with adjacent fragmentation Plain film R femur with enthesophyte at superior pole of patella with fragmentation - possible avulsion injury at superior pole of patella CT right ankle pending Plain films L knee pending Given continued pain,  elevated inflammatory markers, and R knee effusion, discussed with orthopedics who recommended IR to drain.  I discussed with her son as I don't think she'll consent without encouragement.    Elevated Inflammatory Markers Unclear cause - seems out of proportion to covid Follow procalcitonin RF, anti CCP, ana  Elevated Troponin Not c/w ACS EKG reassuring Possibly related to rhabdo  AKI on CKD IIIa Trend  Dementia Delirium precautions  Delevated AST In setting of rhabdo   Obesity Body mass index is 47.12 kg/m.    DVT prophylaxis: heparin  Code Status: full Family Communication: son, Mr. Bonneau 1/8 in hall before I saw his mother - called 1/8 pm to update, but no answer Disposition:   Status is: Inpatient Remains inpatient appropriate because: need for continued inpatient care   Consultants:  none  Procedures:  none  Antimicrobials:  Anti-infectives (From admission, onward)    None       Subjective:    Objective: Vitals:   05/06/24 2116 05/07/24 0105 05/07/24 0459 05/07/24 0830  BP: 124/72 131/66 (!) 148/79 137/69  Pulse: 71 68 73 75  Resp: 19 20 20 16   Temp: 98.5 F (36.9 C) 98.5 F (36.9 C) 99.1 F (37.3 C) 97.6 F (36.4 C)  TempSrc:      SpO2: 98% 99% 100% 100%  Weight:      Height:        Intake/Output Summary (Last 24 hours) at 05/07/2024 1523 Last data filed at 05/07/2024 0457 Gross per 24 hour  Intake 1200.04 ml  Output 450 ml  Net 750.04 ml  Filed Weights   05/03/24 1735  Weight: 120.7 kg    Examination:  Difficult exam - she refuses Susa Bones lot  General: No acute distress. Cardiovascular: RRR Lungs: unlabored Neurological: Alert and oriented 3. Moves all extremities 4. Cranial nerves II through XII grossly intact. Extremities: limited exam - R knee swollen, not particularly ttp - today she notes more pain to L knee.  R ankle not particularly TTP.  Data Reviewed: I have personally reviewed following labs and imaging  studies  CBC: Recent Labs  Lab 05/03/24 2120 05/04/24 0235 05/05/24 0213 05/06/24 0258 05/07/24 1016  WBC 6.1 6.0 7.4 8.2 7.7  NEUTROABS 4.9  --   --  5.6 6.0  HGB 14.7 15.5* 12.7 11.0* 10.4*  HCT 44.6 47.4* 37.6 34.2* 31.8*  MCV 82.1 82.3 81.2 84.7 82.0  PLT 161 137* 146* 128* 143*    Basic Metabolic Panel: Recent Labs  Lab 05/03/24 2120 05/04/24 0235 05/05/24 0213 05/06/24 0258 05/07/24 1016  NA 141 139 138 138 138  K 4.1 4.0 3.9 4.0 3.8  CL 103 103 107 107 109  CO2 23 22 21* 23 19*  GLUCOSE 148* 120* 96 95 117*  BUN 34* 37* 38* 32* 26*  CREATININE 1.46* 1.58* 1.32* 1.33* 0.99  CALCIUM 9.6 9.4 8.3* 8.1* 8.1*  MG  --  2.5* 2.4 2.3  --   PHOS  --  3.9 2.8 2.2*  --     GFR: Estimated Creatinine Clearance: 59.9 mL/min (by C-G formula based on SCr of 0.99 mg/dL).  Liver Function Tests: Recent Labs  Lab 05/03/24 2120 05/04/24 0235 05/05/24 0213 05/06/24 0258 05/07/24 1016  AST 129* 178* 201* 139* 98*  ALT 21 27 33 32 28  ALKPHOS 64 59 47 42 50  BILITOT 0.7 0.8 0.7 0.5 0.5  PROT 7.9 7.3 6.1* 5.8* 5.5*  ALBUMIN  4.1 3.9 3.1* 2.9* 2.4*    CBG: No results for input(s): GLUCAP in the last 168 hours.   Recent Results (from the past 240 hours)  Resp panel by RT-PCR (RSV, Flu Treson Laura&B, Covid) Anterior Nasal Swab     Status: Abnormal   Collection Time: 05/05/24  7:40 AM   Specimen: Anterior Nasal Swab  Result Value Ref Range Status   SARS Coronavirus 2 by RT PCR POSITIVE (Bhavika Schnider) NEGATIVE Final   Influenza Delmon Andrada by PCR NEGATIVE NEGATIVE Final   Influenza B by PCR NEGATIVE NEGATIVE Final    Comment: (NOTE) The Xpert Xpress SARS-CoV-2/FLU/RSV plus assay is intended as an aid in the diagnosis of influenza from Nasopharyngeal swab specimens and should not be used as Hermine Feria sole basis for treatment. Nasal washings and aspirates are unacceptable for Xpert Xpress SARS-CoV-2/FLU/RSV testing.  Fact Sheet for Patients: bloggercourse.com  Fact Sheet for  Healthcare Providers: seriousbroker.it  This test is not yet approved or cleared by the [place]  FDA and has been authorized for detection and/or diagnosis of SARS-CoV-2 by FDA under an Emergency Use Authorization (EUA). This EUA will remain in effect (meaning this test can be used) for the duration of the COVID-19 declaration under Section 564(b)(1) of the Act, 21 U.S.C. section 360bbb-3(b)(1), unless the authorization is terminated or revoked.     Resp Syncytial Virus by PCR NEGATIVE NEGATIVE Final    Comment: (NOTE) Fact Sheet for Patients: bloggercourse.com  Fact Sheet for Healthcare Providers: seriousbroker.it  This test is not yet approved or cleared by the [place]  FDA and has been authorized for detection and/or diagnosis of SARS-CoV-2 by FDA under an  Emergency Use Authorization (EUA). This EUA will remain in effect (meaning this test can be used) for the duration of the COVID-19 declaration under Section 564(b)(1) of the Act, 21 U.S.C. section 360bbb-3(b)(1), unless the authorization is terminated or revoked.  Performed at [facility], [address] [place], [place] [address]   Culture, blood (Routine X 2) w Reflex to ID Panel     Status: None (Preliminary result)   Collection Time: 05/06/24 11:37 AM   Specimen: BLOOD LEFT HAND  Result Value Ref Range Status   Specimen Description BLOOD LEFT HAND  Final   Special Requests   Final    BOTTLES DRAWN AEROBIC ONLY Blood Culture results may not be optimal due to an inadequate volume of blood received in culture bottles   Culture   Final    NO GROWTH < 24 HOURS Performed at [facility], [address], [place], [place] [address]    Report Status PENDING  Incomplete  Culture, blood (Routine X 2) w Reflex to ID Panel     Status: None (Preliminary result)   Collection Time: 05/06/24 11:43 AM   Specimen: BLOOD RIGHT ARM   Result Value Ref Range Status   Specimen Description BLOOD RIGHT ARM  Final   Special Requests   Final    BOTTLES DRAWN AEROBIC AND ANAEROBIC Blood Culture adequate volume   Culture   Final    NO GROWTH < 24 HOURS Performed at [facility], [address], [place], [place] [address]    Report Status PENDING  Incomplete         Radiology Studies: DG Ankle 2 Views Right Result Date: 05/05/2024 EXAM: 2 VIEW(S) XRAY OF THE _LATERALITY_ ANKLE 05/05/2024 08:16:00 PM CLINICAL HISTORY: Pain COMPARISON: None available. FINDINGS: BONES AND JOINTS: No acute fracture. No malalignment. Prominent plantar calcaneal spur. Fragmentation adjacent to the spur may represent plantar fasciitis. Tibiotalar and tibiofibular degenerative changes with osteophyte formation. Midfoot degenerative changes. SOFT TISSUES: Subcutaneous soft tissue edema. IMPRESSION: 1. Prominent plantar calcaneal spur with adjacent fragmentation, possibly representing plantar fasciitis. 2. Subcutaneous soft tissue edema. 3. Tibiotalar, tibiofibular, and midfoot degenerative changes with osteophyte formation. Electronically signed by: Elsie Gravely MD 05/05/2024 08:44 PM EST RP Workstation: [id number]   DG CHEST PORT 1 VIEW Result Date: 05/05/2024 EXAM: 1 VIEW(S) XRAY OF THE CHEST 05/05/2024 08:16:00 PM COMPARISON: Comparison with 05/12/2021. CLINICAL HISTORY: COVID FINDINGS: LUNGS AND PLEURA: Low lung volumes. No focal pulmonary opacity. No pleural effusion. No pneumothorax. HEART AND MEDIASTINUM: Mild cardiomegaly, likely accentuated by technique. Calcification of the aorta. BONES AND SOFT TISSUES: Cervical fusion hardware. Partially imaged lumbar fusion hardware. Surgical clips in LEFT lower neck. Degenerative changes in the spine. No acute osseous abnormality. IMPRESSION: 1. No acute findings. 2. Mild cardiomegaly, likely accentuated by technique. Electronically signed by: Elsie Gravely MD 05/05/2024 08:43 PM EST RP Workstation:  [id number]   DG FEMUR PORT, MIN 2 VIEWS RIGHT Result Date: 05/05/2024 EXAM: 2 VIEW(S) XRAY OF THE RIGHT FEMUR 05/05/2024 08:16:00 PM COMPARISON: Right hip 05/03/2024. CT right knee 05/04/2024. CLINICAL HISTORY: Pain. FINDINGS: BONES AND JOINTS: Right knee arthroplasty in place. Enthesophyte in the superior pole of the patella with fragmentation, possibly avulsion. Moderate right knee joint effusion. No malalignment. SOFT TISSUES: Unremarkable. IMPRESSION: 1. Enthesophyte at the superior pole of the patella with fragmentation, possibly representing avulsion injury. 2. Moderate right knee joint effusion. 3. Right knee arthroplasty in place. Electronically signed by: Elsie Gravely MD 05/05/2024 08:35 PM EST RP Workstation:  [id number]   DG Foot 2 Views Right Result Date: 05/05/2024 EXAM: 1 or 2 VIEW(S) XRAY OF THE RIGHT FOOT 05/05/2024 08:16:00 PM COMPARISON: Right ankle 05/05/2024. CLINICAL HISTORY: Pain. FINDINGS: BONES AND JOINTS: No acute fracture. No malalignment. Large plantar calcaneal spur. Bone fragment adjacent to the spur may represent calcific tendinosis. Mild midfoot degenerative changes. SOFT TISSUES: Dorsal forefoot subcutaneous soft tissue edema. IMPRESSION: 1. Large plantar calcaneal spur with adjacent bone fragment, possibly representing calcific tendinosis. 2. Dorsal forefoot subcutaneous soft tissue edema. 3. Mild midfoot degenerative changes. Electronically signed by: Elsie Gravely MD 05/05/2024 08:25 PM EST RP Workstation: [id number]        Scheduled Meds:  acetaminophen   1,000 mg Oral Q8H   heparin   5,000 Units Subcutaneous Q8H   Continuous Infusions:     LOS: 3 days    Time spent: over 30 min     Meliton Monte, MD [facility]   To contact the attending provider between 7A-7P or the covering provider during after hours 7P-7A, please log into the web site [email or web address] and access using universal [place] password for that web site. If you do not have  the password, please call the hospital operator.  05/07/2024, 3:23 PM    "

## 2024-05-07 NOTE — Plan of Care (Signed)
  Problem: Education: Goal: Knowledge of General Education information will improve Description Including pain rating scale, medication(s)/side effects and non-pharmacologic comfort measures Outcome: Progressing   Problem: Activity: Goal: Risk for activity intolerance will decrease Outcome: Progressing   Problem: Nutrition: Goal: Adequate nutrition will be maintained Outcome: Progressing   Problem: Elimination: Goal: Will not experience complications related to bowel motility Outcome: Progressing Goal: Will not experience complications related to urinary retention Outcome: Progressing   Problem: Safety: Goal: Ability to remain free from injury will improve Outcome: Progressing

## 2024-05-08 ENCOUNTER — Inpatient Hospital Stay (HOSPITAL_COMMUNITY)

## 2024-05-08 DIAGNOSIS — M6282 Rhabdomyolysis: Secondary | ICD-10-CM | POA: Diagnosis not present

## 2024-05-08 LAB — COMPREHENSIVE METABOLIC PANEL WITH GFR
ALT: 25 U/L (ref 0–44)
AST: 69 U/L — ABNORMAL HIGH (ref 15–41)
Albumin: 2.4 g/dL — ABNORMAL LOW (ref 3.5–5.0)
Alkaline Phosphatase: 60 U/L (ref 38–126)
Anion gap: 7 (ref 5–15)
BUN: 24 mg/dL — ABNORMAL HIGH (ref 8–23)
CO2: 22 mmol/L (ref 22–32)
Calcium: 8 mg/dL — ABNORMAL LOW (ref 8.9–10.3)
Chloride: 104 mmol/L (ref 98–111)
Creatinine, Ser: 0.91 mg/dL (ref 0.44–1.00)
GFR, Estimated: >60 mL/min
Glucose, Bld: 101 mg/dL — ABNORMAL HIGH (ref 70–99)
Potassium: 4 mmol/L (ref 3.5–5.1)
Sodium: 133 mmol/L — ABNORMAL LOW (ref 135–145)
Total Bilirubin: 0.5 mg/dL (ref 0.0–1.2)
Total Protein: 5.4 g/dL — ABNORMAL LOW (ref 6.5–8.1)

## 2024-05-08 LAB — CBC WITH DIFFERENTIAL/PLATELET
Abs Immature Granulocytes: 0.05 K/uL (ref 0.00–0.07)
Basophils Absolute: 0 K/uL (ref 0.0–0.1)
Basophils Relative: 0 %
Eosinophils Absolute: 0.2 K/uL (ref 0.0–0.5)
Eosinophils Relative: 3 %
HCT: 29 % — ABNORMAL LOW (ref 36.0–46.0)
Hemoglobin: 9.7 g/dL — ABNORMAL LOW (ref 12.0–15.0)
Immature Granulocytes: 1 %
Lymphocytes Relative: 23 %
Lymphs Abs: 1.7 K/uL (ref 0.7–4.0)
MCH: 26.9 pg (ref 26.0–34.0)
MCHC: 33.4 g/dL (ref 30.0–36.0)
MCV: 80.3 fL (ref 80.0–100.0)
Monocytes Absolute: 0.7 K/uL (ref 0.1–1.0)
Monocytes Relative: 9 %
Neutro Abs: 4.8 K/uL (ref 1.7–7.7)
Neutrophils Relative %: 64 %
Platelets: 176 K/uL (ref 150–400)
RBC: 3.61 MIL/uL — ABNORMAL LOW (ref 3.87–5.11)
RDW: 14.7 % (ref 11.5–15.5)
WBC: 7.4 K/uL (ref 4.0–10.5)
nRBC: 0 % (ref 0.0–0.2)

## 2024-05-08 LAB — PHOSPHORUS: Phosphorus: 2.2 mg/dL — ABNORMAL LOW (ref 2.5–4.6)

## 2024-05-08 LAB — MAGNESIUM: Magnesium: 2.3 mg/dL (ref 1.7–2.4)

## 2024-05-08 LAB — C-REACTIVE PROTEIN: CRP: 24.8 mg/dL — ABNORMAL HIGH

## 2024-05-08 LAB — RHEUMATOID FACTOR: Rheumatoid fact SerPl-aCnc: 18.1 [IU]/mL — ABNORMAL HIGH

## 2024-05-08 LAB — ANA W/REFLEX IF POSITIVE: Anti Nuclear Antibody (ANA): NEGATIVE

## 2024-05-08 NOTE — NC FL2 (Signed)
 " [place]  MEDICAID FL2 LEVEL OF CARE FORM     IDENTIFICATION  Patient Name: [name] Birthdate: 04/06/1946 Sex: female Admission Date (Current Location): 05/03/2024  [facility] and [place] Number:  Producer, Television/film/video and Address:  [facility] [place], [address], [place], [place] [address]      Provider Number: [id number]  Attending Physician Name and Address:  Perri DELENA Meliton Mickey., *  Relative Name and Phone Number:  Allora Bains: [phone]    Current Level of Care: Hospital Recommended Level of Care: Skilled Nursing Facility Prior Approval Number:    Date Approved/Denied:   PASRR Number: [id number]  Discharge Plan: SNF    Current Diagnoses: Patient Active Problem List   Diagnosis Date Noted   Acute right hip pain 05/06/2024   Fall at home 05/05/2024   Rhabdomyolysis 05/04/2024   CKD stage 3a, GFR 45-59 ml/min (HCC) 05/04/2024   Dementia (HCC) 05/04/2024   Elevated troponin 05/04/2024   Elevated transaminase level 05/04/2024   Neurogenic claudication due to lumbar spinal stenosis 09/30/2017   Fusion of spine of thoracolumbar region 09/30/2017   S/P partial thyroidectomy 08/23/2016   Spinal stenosis, lumbar region, with neurogenic claudication 01/23/2016   Other secondary scoliosis, 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   Spondylolisthesis of 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   Diastolic dysfunction    Hypokalemia 03/08/2015   Bradycardia 03/08/2015   Syncope and collapse 03/02/2015   Pancreatic cyst 02/28/2015   HNP (herniated nucleus pulposus), cervical 07/20/2012   Cervical spondylosis without myelopathy 07/20/2012   Venous (peripheral) insufficiency 06/08/2010   Asymptomatic varicose veins 06/07/2010   HYPERLIPIDEMIA 04/16/2010   Essential hypertension 04/16/2010   GERD 04/16/2010   Diaphragmatic hernia 04/16/2010   Osteoarthritis 04/16/2010   Dysphagia 04/16/2010   Atrophic vaginitis 03/31/2009    Orientation RESPIRATION BLADDER  Height & Weight     Self  Normal Incontinent, External catheter Weight: 266 lb (120.7 kg) Height:  5' 3 (160 cm)  BEHAVIORAL SYMPTOMS/MOOD NEUROLOGICAL BOWEL NUTRITION STATUS      Continent Diet (please refer to dc summary)  AMBULATORY STATUS COMMUNICATION OF NEEDS Skin   Extensive Assist Verbally Normal                       Personal Care Assistance Level of Assistance  Bathing, Feeding, Dressing Bathing Assistance: Limited assistance Feeding assistance: Limited assistance Dressing Assistance: Limited assistance     Functional Limitations Info  Sight, Hearing, Speech Sight Info: Impaired Hearing Info: Adequate Speech Info: Adequate    SPECIAL CARE FACTORS FREQUENCY  PT (By licensed PT), OT (By licensed OT)     PT Frequency: 5x/week OT Frequency: 5x/week            Contractures Contractures Info: Not present    Additional Factors Info  Code Status, Allergies Code Status Info: FULL Allergies Info: lisinopril , penicillins, ultram           Current Medications (05/08/2024):  This is the current hospital active medication list Current Facility-Administered Medications  Medication Dose Route Frequency Provider Last Rate Last Admin   acetaminophen  (TYLENOL ) tablet 1,000 mg  1,000 mg Oral Q8H Perri DELENA Meliton Mickey., MD   1,000 mg at 05/08/24 9478   Followed by   NOREEN ON 05/13/2024] acetaminophen  (TYLENOL ) tablet 650 mg  650 mg Oral Q6H PRN Perri DELENA Meliton Mickey., MD       heparin  injection 5,000 Units  5,000 Units Subcutaneous Q8H Opyd, Timothy S, MD   5,000  Units at 05/08/24 0522   morphine  (PF) 2 MG/ML injection 1 mg  1 mg Intravenous Q3H PRN Perri DELENA Meliton Mickey., MD   1 mg at 05/07/24 1749   ondansetron  (ZOFRAN ) tablet 4 mg  4 mg Oral Q6H PRN Opyd, Timothy S, MD       Or   ondansetron  (ZOFRAN ) injection 4 mg  4 mg Intravenous Q6H PRN Opyd, Timothy S, MD       oxyCODONE  (Oxy IR/ROXICODONE ) immediate release tablet 5-10 mg  5-10 mg Oral Q4H PRN Opyd, Timothy  S, MD   10 mg at 05/07/24 1005   senna (SENOKOT) tablet 8.6 mg  1 tablet Oral Daily PRN Opyd, Timothy S, MD         Discharge Medications: Please see discharge summary for a list of discharge medications.  Relevant Imaging Results:  Relevant Lab Results:   Additional Information SS#: [id number]  Sherline Clack, LCSWA     "

## 2024-05-08 NOTE — Progress Notes (Addendum)
 " PROGRESS NOTE    [name]  FMW:[id number] DOB: 17-Aug-1945 DOA: 05/03/2024 PCP: [facility], [facility]  Chief Complaint  Patient presents with   [name]    Brief Narrative:   [name] is [name] 79 y.o. female with medical history significant for dementia, hypertension, and hyperlipidemia who presents with right hip pain after an unwitnessed fall at home.   Assessment & Plan:   Principal Problem:   Rhabdomyolysis Active Problems:   Essential hypertension   CKD stage 3a, GFR 45-59 ml/min (HCC)   Dementia (HCC)   Elevated troponin   Elevated transaminase level   Fall at home   Acute right hip pain  COVID 19 Infection Fever Fever suspected in setting of COVID 19 infection Could have contributed to fall with generalized weakness O2 sat generally above 95%, will hold off on any antivirals at this time Blood cultures pending collection Significantly elevated inflammatory markers - related to covid? Normal white count, lower suspicion for bacterial illness  Rhabdomyolysis UA with hemoglobin, 0-5 RBC - suggestive of myoglobinuria Continue IVF, will trend CK (improving)  Right Hip Pain Right Ankle Pain Lower Extremity Pain At home using walker at times and cane at times Refusing Palmer Fahrner good portion of exam, difficult - today she threatened to throw remote at me.  When asked where her pain was today, gave inconsistent responses.  Was more TTP today to L knee. CT pelvis with severe bone on bone L hip arthrosis with juxtaarticular cystic changes  CT R knee with well seated hardware - small to moderate knee joint effusion Plain films of R knee and ankle with large plantar calcaneal spur with adjacent bone fragment, priminent plantar calcaneal spur with adjacent fragmentation Plain film R femur with enthesophyte at superior pole of patella with fragmentation - possible avulsion injury at superior pole of patella CT right ankle pending Plain films L knee pending As noted before, exam  has been difficult.  Pain subjectively seems to be migratory (earlier had CT R knee for her pain, but now is c/o more L Knee pain).  Inflammatory markers are significantly elevated, higher than I'd expect with covid.  Will discuss with orthopedics today regarding effusion.    Elevated Inflammatory Markers Unclear cause - seems out of proportion to covid Follow procalcitonin - 0.3 RF (mildly positive), anti CCP, ana  Elevated Troponin Not c/w ACS EKG reassuring Possibly related to rhabdo  AKI on CKD IIIa resolved  Dementia Delirium precautions This has made our exam/evaluation difficult  Delevated AST In setting of rhabdo  - mild  Obesity Body mass index is 47.12 kg/m.    DVT prophylaxis: heparin  Code Status: full Family Communication: son, Mr. Rexrode 1/8 in hall before I saw his mother - called 1/8 pm to update, but no answer Disposition:   Status is: Inpatient Remains inpatient appropriate because: need for continued inpatient care   Consultants:  none  Procedures:  none  Antimicrobials:  Anti-infectives (From admission, onward)    None       Subjective:  No complaints  Doesn't want me to roll her or move her legs  Objective: Vitals:   05/08/24 0042 05/08/24 0453 05/08/24 0828 05/08/24 1211  BP: 108/70 129/70 117/61 130/68  Pulse: 79 73 67 72  Resp: 20 20 20 18   Temp: 99.4 F (37.4 C) 98.8 F (37.1 C) 98.5 F (36.9 C) 98 F (36.7 C)  TempSrc:      SpO2: 98% 96% 97% 97%  Weight:  Height:        Intake/Output Summary (Last 24 hours) at 05/08/2024 1537 Last data filed at 05/08/2024 0800 Gross per 24 hour  Intake 240 ml  Output 200 ml  Net 40 ml   Filed Weights   05/03/24 1735  Weight: 120.7 kg    Examination:  Remains Mahamed Zalewski difficult exam  General: No acute distress. Cardiovascular: RRR Lungs: unlabored Neurological: Alert and oriented 3. Limited movement of bilateral LE's due to pain. Skin: Warm and dry. No rashes or  lesions. Extremities: she notes the most TTP when I touch her L knee today - no notable TTP to R knee (I don't range her extremities as she won't let me), but I do palpate R knee and apply some pressure without notable discomfort - I don't appreciate any joint to be particularly swollen (though body habitus limiting) or warm  Data Reviewed: I have personally reviewed following labs and imaging studies  CBC: Recent Labs  Lab 05/03/24 2120 05/04/24 0235 05/05/24 0213 05/06/24 0258 05/07/24 1016 05/08/24 0547  WBC 6.1 6.0 7.4 8.2 7.7 7.4  NEUTROABS 4.9  --   --  5.6 6.0 4.8  HGB 14.7 15.5* 12.7 11.0* 10.4* 9.7*  HCT 44.6 47.4* 37.6 34.2* 31.8* 29.0*  MCV 82.1 82.3 81.2 84.7 82.0 80.3  PLT 161 137* 146* 128* 143* 176    Basic Metabolic Panel: Recent Labs  Lab 05/04/24 0235 05/05/24 0213 05/06/24 0258 05/07/24 1016 05/08/24 0547  NA 139 138 138 138 133*  K 4.0 3.9 4.0 3.8 4.0  CL 103 107 107 109 104  CO2 22 21* 23 19* 22  GLUCOSE 120* 96 95 117* 101*  BUN 37* 38* 32* 26* 24*  CREATININE 1.58* 1.32* 1.33* 0.99 0.91  CALCIUM 9.4 8.3* 8.1* 8.1* 8.0*  MG 2.5* 2.4 2.3  --  2.3  PHOS 3.9 2.8 2.2*  --  2.2*    GFR: Estimated Creatinine Clearance: 65.1 mL/min (by C-G formula based on SCr of 0.91 mg/dL).  Liver Function Tests: Recent Labs  Lab 05/04/24 0235 05/05/24 0213 05/06/24 0258 05/07/24 1016 05/08/24 0547  AST 178* 201* 139* 98* 69*  ALT 27 33 32 28 25  ALKPHOS 59 47 42 50 60  BILITOT 0.8 0.7 0.5 0.5 0.5  PROT 7.3 6.1* 5.8* 5.5* 5.4*  ALBUMIN  3.9 3.1* 2.9* 2.4* 2.4*    CBG: No results for input(s): GLUCAP in the last 168 hours.   Recent Results (from the past 240 hours)  Resp panel by RT-PCR (RSV, Flu Manasvi Dickard&B, Covid) Anterior Nasal Swab     Status: Abnormal   Collection Time: 05/05/24  7:40 AM   Specimen: Anterior Nasal Swab  Result Value Ref Range Status   SARS Coronavirus 2 by RT PCR POSITIVE (Iline Buchinger) NEGATIVE Final   Influenza Faten Frieson by PCR NEGATIVE NEGATIVE  Final   Influenza B by PCR NEGATIVE NEGATIVE Final    Comment: (NOTE) The Xpert Xpress SARS-CoV-2/FLU/RSV plus assay is intended as an aid in the diagnosis of influenza from Nasopharyngeal swab specimens and should not be used as Aleynah Rocchio sole basis for treatment. Nasal washings and aspirates are unacceptable for Xpert Xpress SARS-CoV-2/FLU/RSV testing.  Fact Sheet for Patients: bloggercourse.com  Fact Sheet for Healthcare Providers: seriousbroker.it  This test is not yet approved or cleared by the [place]  FDA and has been authorized for detection and/or diagnosis of SARS-CoV-2 by FDA under an Emergency Use Authorization (EUA). This EUA will remain in effect (meaning this test can be used) for  the duration of the COVID-19 declaration under Section 564(b)(1) of the Act, 21 U.S.C. section 360bbb-3(b)(1), unless the authorization is terminated or revoked.     Resp Syncytial Virus by PCR NEGATIVE NEGATIVE Final    Comment: (NOTE) Fact Sheet for Patients: bloggercourse.com  Fact Sheet for Healthcare Providers: seriousbroker.it  This test is not yet approved or cleared by the [place]  FDA and has been authorized for detection and/or diagnosis of SARS-CoV-2 by FDA under an Emergency Use Authorization (EUA). This EUA will remain in effect (meaning this test can be used) for the duration of the COVID-19 declaration under Section 564(b)(1) of the Act, 21 U.S.C. section 360bbb-3(b)(1), unless the authorization is terminated or revoked.  Performed at [facility], [address] [place], [place] [address]   Culture, blood (Routine X 2) w Reflex to ID Panel     Status: None (Preliminary result)   Collection Time: 05/06/24 11:37 AM   Specimen: BLOOD LEFT HAND  Result Value Ref Range Status   Specimen Description BLOOD LEFT HAND  Final   Special Requests   Final    BOTTLES  DRAWN AEROBIC ONLY Blood Culture results may not be optimal due to an inadequate volume of blood received in culture bottles   Culture   Final    NO GROWTH 2 DAYS Performed at [facility], [address], [place], [place] [address]    Report Status PENDING  Incomplete  Culture, blood (Routine X 2) w Reflex to ID Panel     Status: None (Preliminary result)   Collection Time: 05/06/24 11:43 AM   Specimen: BLOOD RIGHT ARM  Result Value Ref Range Status   Specimen Description BLOOD RIGHT ARM  Final   Special Requests   Final    BOTTLES DRAWN AEROBIC AND ANAEROBIC Blood Culture adequate volume   Culture   Final    NO GROWTH 2 DAYS Performed at [facility], [address], [place], [place] [address]    Report Status PENDING  Incomplete         Radiology Studies: DG Knee 1-2 Views Left Result Date: 05/07/2024 EXAM: 1 or 2 VIEW(S) XRAY OF THE LEFT KNEE 05/07/2024 08:43:00 PM COMPARISON: Left knee x ray 12/09/2023. CLINICAL HISTORY: Pain. FINDINGS: BONES AND JOINTS: Left total knee arthroplasty noted. Intact orthopedic hardware without periprosthetic fracture or lucency. No malalignment. No significant joint effusion. SOFT TISSUES: Vascular calcifications. IMPRESSION: 1. Left total knee arthroplasty with intact orthopedic hardware without periprosthetic fracture or lucency. Electronically signed by: Greig Pique MD MD 05/07/2024 10:45 PM EST RP Workstation: [id number]   CT ANKLE RIGHT WO CONTRAST Result Date: 05/07/2024 CLINICAL DATA:  Foot and ankle pain. EXAM: CT OF THE RIGHT ANKLE WITHOUT CONTRAST TECHNIQUE: Multidetector CT imaging of the right ankle was performed according to the standard protocol. Multiplanar CT image reconstructions were also generated. RADIATION DOSE REDUCTION: This exam was performed according to the departmental dose-optimization program which includes automated exposure control, adjustment of the mA and/or kV according to patient size and/or use of  iterative reconstruction technique. COMPARISON:  Radiographs dated 05/05/2024. FINDINGS: Bones/Joint/Cartilage No acute fracture or dislocation. Prominent plantar calcaneal spur with thickening of the central cord of the plantar fascia, compatible with chronic plantar fasciitis. Acute plantar fasciitis can not be excluded. 3 mm ossicle within the substance of the origin of the central cord of the plantar fascia likely reflects chronic enthesopathic change. Mild enthesopathy at the calcaneal insertion of the distal Achilles tendon with foci  of mineralization. The distal Achilles tendon appears intact. Mild-to-moderate tibiotalar osteoarthritis with subcortical cystic changes. Mild-to-moderate osteoarthritis noted elsewhere throughout the hindfoot and midfoot, most pronounced at the second and third TMT joints with subcortical cystic changes. Ligaments Ligaments are suboptimally evaluated by CT. Muscles and Tendons Unremarkable. Soft tissue Generalized nonspecific subcutaneous edema of the ankle extending through the hindfoot. No loculated fluid collection. IMPRESSION: 1. Prominent plantar calcaneal spur with thickening of the central cord of the plantar fascia, compatible with chronic plantar fasciitis. Acute plantar fasciitis can not be excluded. 2. 3 mm ossicle within the substance of the origin of the central cord of the plantar fascia likely reflects chronic enthesopathic change. 3. Mild enthesopathy at the calcaneal insertion of the distal Achilles tendon with foci of mineralization. The distal Achilles tendon appears intact. 4. Mild-to-moderate tibiotalar osteoarthritis with subcortical cystic changes. Mild-to-moderate osteoarthritis noted elsewhere throughout the hindfoot and midfoot, most pronounced at the second and third TMT joints with subcortical cystic changes. 5. Generalized nonspecific subcutaneous edema of the ankle extending through the hindfoot. Electronically Signed   By: Harrietta Sherry M.D.    On: 05/07/2024 16:48        Scheduled Meds:  acetaminophen   1,000 mg Oral Q8H   heparin   5,000 Units Subcutaneous Q8H   Continuous Infusions:     LOS: 4 days    Time spent: over 30 min     Meliton Monte, MD [facility]   To contact the attending provider between 7A-7P or the covering provider during after hours 7P-7A, please log into the web site [email or web address] and access using universal [place] password for that web site. If you do not have the password, please call the hospital operator.  05/08/2024, 3:37 PM    "

## 2024-05-08 NOTE — Plan of Care (Signed)
  Problem: Pain Managment: Goal: General experience of comfort will improve and/or be controlled Outcome: Progressing   Problem: Safety: Goal: Ability to remain free from injury will improve Outcome: Progressing

## 2024-05-08 NOTE — Progress Notes (Signed)
 Requisition for air mattress sent to portable equipment department. Awaiting for availability between now until Monday per secretary.

## 2024-05-08 NOTE — Progress Notes (Signed)
 Pt refused q2h Turns this shift.  Hx refusal q2h Turns per Report.  PT eval 05/07/24.  Will continue to monitor.

## 2024-05-08 NOTE — TOC Initial Note (Signed)
 Transition of Care [facility]) - Initial/Assessment Note    Patient Details  Name: [name] MRN: [id number] Date of Birth: [birth year]  [facility] CM/SW Contact:    Sherline Clack, LCSWA Phone Number: 05/08/2024, 10:16 AM  Clinical Narrative:                  CSW called patient's son to discuss anticipated discharge plan. CSW shared PT's recommendation for SNF, to which son was agreeable. CSW explained SNF referral process and son requested CSW fax patient's information to facilities. CSW will send referral through the HUB and will keep son updated on bed offers. Son told CSW he works from 5 am to 3 pm M-F, so he will not be reachable by phone during these hours. Son will leave supervisor's number with staff when he comes to visit later today (1/10) so staff can get in contact with him for updates and emergencies. CSW will continue to follow.   Expected Discharge Plan: Skilled Nursing Facility Barriers to Discharge: SNF Pending bed offer, Insurance Authorization   Patient Goals and CMS Choice            Expected Discharge Plan and Services       Living arrangements for the past 2 months: Apartment                                      Prior Living Arrangements/Services Living arrangements for the past 2 months: Apartment Lives with:: Self                   Activities of Daily Living   ADL Screening (condition at time of admission) Independently performs ADLs?: Yes (appropriate for developmental age) Is the patient deaf or have difficulty hearing?: No Does the patient have difficulty seeing, even when wearing glasses/contacts?: No Does the patient have difficulty concentrating, remembering, or making decisions?: No  Permission Sought/Granted                  Emotional Assessment Appearance:: Appears stated age Attitude/Demeanor/Rapport: Unable to Assess Affect (typically observed): Unable to Assess Orientation: :  Oriented to Self      Admission diagnosis:  Rhabdomyolysis [M62.82] Acute right hip pain [M25.551] Fall in home, initial encounter [W19.XXXA, Y92.009] Non-traumatic rhabdomyolysis [M62.82] Patient Active Problem List   Diagnosis Date Noted   Acute right hip pain 05/06/2024   Fall at home 05/05/2024   Rhabdomyolysis 05/04/2024   CKD stage 3a, GFR 45-59 ml/min (HCC) 05/04/2024   Dementia (HCC) 05/04/2024   Elevated troponin 05/04/2024   Elevated transaminase level 05/04/2024   Neurogenic claudication due to lumbar spinal stenosis 09/30/2017    Class: Chronic   Fusion of spine of thoracolumbar region 09/30/2017   S/P partial thyroidectomy 08/23/2016   Spinal stenosis, lumbar region, with neurogenic claudication 01/23/2016    Class: Chronic   Other secondary scoliosis, 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    Class: Chronic   Spondylolisthesis of 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   Diastolic dysfunction    Hypokalemia 03/08/2015   Bradycardia 03/08/2015   Syncope and collapse 03/02/2015   Pancreatic cyst 02/28/2015   HNP (herniated nucleus pulposus), cervical 07/20/2012    Class: Acute   Cervical spondylosis without myelopathy 07/20/2012    Class: Chronic   Venous (peripheral) insufficiency 06/08/2010   Asymptomatic varicose veins 06/07/2010   HYPERLIPIDEMIA 04/16/2010   Essential hypertension 04/16/2010   GERD 04/16/2010  Diaphragmatic hernia 04/16/2010   Osteoarthritis 04/16/2010   Dysphagia 04/16/2010   Atrophic vaginitis 03/31/2009   PCP:  Health, [address] Pharmacy:   [facility]) - [place], [place] - [address] [place] [address] [place] [address] [place] [address] Phone: [phone] Fax: [phone]  [facility] [address] [place] ([place]), [place] - [address] [place]) [place] [address] Phone: [phone] Fax: [phone]     Social Drivers of Health (SDOH) Social History: SDOH Screenings   Food  Insecurity: No Food Insecurity (05/04/2024)  Housing: Low Risk (05/04/2024)  Transportation Needs: Unknown (05/04/2024)  Utilities: Not At Risk (05/04/2024)  Social Connections: Moderately Integrated (05/04/2024)  Tobacco Use: Low Risk (05/04/2024)   SDOH Interventions:     Readmission Risk Interventions     No data to display

## 2024-05-09 DIAGNOSIS — M6282 Rhabdomyolysis: Secondary | ICD-10-CM | POA: Diagnosis not present

## 2024-05-09 DIAGNOSIS — M25551 Pain in right hip: Secondary | ICD-10-CM | POA: Diagnosis not present

## 2024-05-09 DIAGNOSIS — Y92009 Unspecified place in unspecified non-institutional (private) residence as the place of occurrence of the external cause: Secondary | ICD-10-CM | POA: Diagnosis not present

## 2024-05-09 DIAGNOSIS — W19XXXA Unspecified fall, initial encounter: Secondary | ICD-10-CM | POA: Diagnosis not present

## 2024-05-09 LAB — SYNOVIAL CELL COUNT + DIFF, W/ CRYSTALS
Crystals, Fluid: NONE SEEN
Eosinophils-Synovial: 0 % (ref 0–1)
Lymphocytes-Synovial Fld: 1 % (ref 0–20)
Monocyte-Macrophage-Synovial Fluid: 11 % — ABNORMAL LOW (ref 50–90)
Neutrophil, Synovial: 88 % — ABNORMAL HIGH (ref 0–25)
WBC, Synovial: 1600 /mm3 — ABNORMAL HIGH (ref 0–200)

## 2024-05-09 LAB — COMPREHENSIVE METABOLIC PANEL WITH GFR
ALT: 25 U/L (ref 0–44)
AST: 72 U/L — ABNORMAL HIGH (ref 15–41)
Albumin: 2.2 g/dL — ABNORMAL LOW (ref 3.5–5.0)
Alkaline Phosphatase: 109 U/L (ref 38–126)
Anion gap: 11 (ref 5–15)
BUN: 18 mg/dL (ref 8–23)
CO2: 17 mmol/L — ABNORMAL LOW (ref 22–32)
Calcium: 8.8 mg/dL — ABNORMAL LOW (ref 8.9–10.3)
Chloride: 106 mmol/L (ref 98–111)
Creatinine, Ser: 0.9 mg/dL (ref 0.44–1.00)
GFR, Estimated: >60 mL/min
Glucose, Bld: 102 mg/dL — ABNORMAL HIGH (ref 70–99)
Potassium: 4.5 mmol/L (ref 3.5–5.1)
Sodium: 134 mmol/L — ABNORMAL LOW (ref 135–145)
Total Bilirubin: 1 mg/dL (ref 0.0–1.2)
Total Protein: 6.3 g/dL — ABNORMAL LOW (ref 6.5–8.1)

## 2024-05-09 LAB — CYCLIC CITRUL PEPTIDE ANTIBODY, IGG/IGA: CCP Antibodies IgG/IgA: 9 U (ref 0–19)

## 2024-05-09 MED ORDER — MELATONIN 3 MG PO TABS
3.0000 mg | ORAL_TABLET | Freq: Every evening | ORAL | Status: DC | PRN
  Administered 2024-05-09 – 2024-05-14 (×4): 3 mg via ORAL
  Filled 2024-05-09 (×4): qty 1

## 2024-05-09 NOTE — Consult Note (Signed)
 Reason for Consult: Right knee pain with history of prior right knee arthroplasty Referring Physician: Perri, MD  Lorrayne LITTIE Hales is an 79 y.o. female.  HPI: PAULYNE MOOTY is a 79 y.o. female with medical history significant for dementia, hypertension, and hyperlipidemia who presents with right hip pain after an unwitnessed fall at home.   Orthopedics was consulted based on concerns for her pain as it seemed to be isolated to her knee.  She had history of prior knee arthroplasty.  She has had a single episode of fever.  It is uncertain if this is related to new diagnosis of COVID related findings or related to her knee. Based on her medical history of dementia with no other history regarding her knee specifically is known as she has not been seen in our office for a few years.  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   Pneumonia    years ago    Past Surgical History:  Procedure Laterality Date   ABDOMINAL HYSTERECTOMY     ANTERIOR CERVICAL DECOMP/DISCECTOMY FUSION N/A 07/20/2012   Procedure: ANTERIOR CERVICAL DISCECTOMY FUSION C5-6, C6-7 with transgraft(Alphatec), local bone graft, plate and screws ;  Surgeon: Lynwood FORBES Better, MD;  Location: [facility] OR;  Service: Orthopedics;  Laterality: N/A;   BACK SURGERY     CERVICAL FUSION     COLONOSCOPY     ESOPHAGOGASTRODUODENOSCOPY     EYE SURGERY Right    cataract    REPLACEMENT TOTAL KNEE BILATERAL     THYROIDECTOMY, PARTIAL     TRANSFORAMINAL LUMBAR INTERBODY FUSION (TLIF) WITH PEDICLE SCREW FIXATION 2 LEVEL N/A 01/23/2016   Procedure: TRANSFORAMINAL LUMBAR INTERBODY FUSION (TLIF) WITH PEDICLE SCREW FIXATION 2 LEVEL WITH HARDWARE REMOVAL AND EXTENSION OF  HARDWARE.;  Surgeon: Lynwood FORBES Better, MD;  Location: [facility] OR;  Service: Orthopedics;  Laterality: N/A;  L1-L3     Family History  Problem Relation Age of Onset   Ovarian cancer Mother    Heart disease Mother    Prostate cancer Father    Heart disease Father    Colon cancer Father    Breast cancer Sister    Prostate cancer Brother    Cystic fibrosis Sister    Heart disease Sister    Pancreatic cancer Neg Hx    Rectal cancer Neg Hx    Stomach cancer Neg Hx     Social History:  reports that she has never smoked. She has never used smokeless tobacco. She reports that she does not drink alcohol and does not use drugs.  Allergies: Allergies[1]  Medications: I have reviewed the patient's current medications. Scheduled:  acetaminophen   1,000 mg Oral Q8H   heparin   5,000 Units Subcutaneous Q8H    No results found for this or any previous visit (from the past 24 hours).   X-ray: EXAM: 1 or 2 VIEW(S) XRAY OF THE LEFT KNEE 05/07/2024 08:43:00 PM   COMPARISON: Left knee x ray 12/09/2023.   CLINICAL HISTORY: Pain.   FINDINGS:   BONES AND JOINTS: Left total knee arthroplasty noted. Intact orthopedic hardware without periprosthetic fracture or lucency. No malalignment. No significant joint  effusion.   SOFT TISSUES: Vascular calcifications.   IMPRESSION: 1. Left total knee arthroplasty with intact orthopedic hardware without periprosthetic fracture or lucency.   Electronically signed by: Greig Pique MD MD 05/07/2024 10:45 PM EST RP  CLINICAL DATA:  Pain after fall.   EXAM: CT OF THE RIGHT KNEE WITHOUT CONTRAST   TECHNIQUE: Multidetector CT imaging of the right knee was performed according to the standard protocol. Multiplanar CT image reconstructions were also generated.   RADIATION DOSE REDUCTION: This exam was performed according to the departmental dose-optimization program which includes automated exposure control, adjustment of the mA and/or kV according  to patient size and/or use of iterative reconstruction technique.   COMPARISON:  None Available.   FINDINGS: Bones/Joint/Cartilage   Status post right total knee arthroplasty with associated streak artifact which limits evaluation of the surrounding bone and soft tissues. Hardware appears well seated with normal alignment. No significant periprosthetic lucency. No acute fracture identified. Small to moderate-sized knee joint effusion. Sclerotic appearance of the patella. Enthesopathy at the superior patellar pole.   Muscles and Tendons No appreciable acute abnormality.  No intramuscular collection.   Soft tissue No fluid collection or hematoma.   IMPRESSION: 1. Status post right total knee arthroplasty with associated streak artifact which limits evaluation of the surrounding bone and soft tissues. Hardware appears well seated with normal alignment. No acute fracture identified. 2.  Small to moderate-sized knee joint effusion. 3. Sclerotic appearance of the patella. Enthesopathy at the superior patellar pole.     Electronically Signed   By: Harrietta Sherry M.D.  ROS: As per HPI  Blood pressure (!) 184/67, pulse 63, temperature 98.4 F (36.9 C), temperature source Oral, resp. rate 17, height 5' 3 (1.6 m), weight 120.7 kg, SpO2 100%.  Physical Exam: General: No acute distress.  Pleasantly demented Cardiovascular: RRR Lungs: unlabored Neurological: Alert and oriented 3. Limited movement of bilateral LE's due to pain. Skin: Warm and dry. No rashes or lesions. Extremities: she notes the most TTP when I touch her right knee today - no notable TTP to R knee (I don't range her extremities as she won't let me), but I do palpate R knee and apply some pressure without notable discomfort - I don't appreciate any joint to be particularly swollen (though body habitus limiting) or warm   Assessment/Plan: 1.  History of right total knee arthroplasty with concerns for right knee  pain with effusion noted on CT scan rule out infection  Plan: Patient was seen and evaluated in her hospital room.  She was not noted to have any significant erythema or warmth on the right knee. Mild to moderate effusion noted Tenderness to palpation as well as tenderness with range of motion  Based on these findings in the current setting of an unknown certain etiology of her fever I did aspirate her right knee.  Procedure note below.  Note that I removed approximately 8 cc of relatively clear appearing synovial fluid.  Despite this it will be sent to the lab for evaluation for cell count and Gram stain and culture.  We will follow-up with the results however likely at this point to be in a septic effusion involving this right knee.  Procedure note: Upon identification of the right knee I used a Betadine swab to clean the lateral aspect of her knee.  I then inserted an 18-gauge needle into her knee and aspirated approximately 8 cc of synovial fluid.  The appearance of the fluid was yellow and clear  without signs of obvious inflammation or obvious purulence. The specimen was sent to the lab for evaluation per orders for cell count Gram stain and culture.  Donnice JONETTA Car 05/09/2024, 1:17 PM         [1]  Allergies Allergen Reactions   Zestril  [Lisinopril ] Swelling    Swelling in mouth and throat   Penicillins Rash   Ultram [Tramadol Hcl] Rash

## 2024-05-09 NOTE — Plan of Care (Signed)
" °  Problem: Respiratory: Goal: Will maintain a patent airway Outcome: Progressing   Problem: Education: Goal: Knowledge of General Education information will improve Description: Including pain rating scale, medication(s)/side effects and non-pharmacologic comfort measures Outcome: Not Progressing   Problem: Activity: Goal: Risk for activity intolerance will decrease Outcome: Not Progressing   Problem: Skin Integrity: Goal: Risk for impaired skin integrity will decrease Outcome: Not Progressing   "

## 2024-05-09 NOTE — Plan of Care (Addendum)
 Attempted to turn and change bed pad with nurse tech Alfonso and Maddie but strongly refusing. Risks and benefits explained but still declined. I will call once I'm wet per patient.   Problem: Pain Managment: Goal: General experience of comfort will improve and/or be controlled Outcome: Progressing   Problem: Safety: Goal: Ability to remain free from injury will improve Outcome: Progressing

## 2024-05-09 NOTE — Progress Notes (Signed)
 " PROGRESS NOTE    [name]  FMW:[id number] DOB: [birth year] DOA: 05/03/2024 PCP: [facility]  Chief Complaint  Patient presents with   [place]    Brief Narrative:   [name] is [name] 79 y.o. female with medical history significant for dementia, hypertension, and hyperlipidemia who presents with right hip pain after an unwitnessed fall at home.   Assessment & Plan:   Principal Problem:   Rhabdomyolysis Active Problems:   Essential hypertension   CKD stage 3a, GFR 45-59 ml/min (HCC)   Dementia (HCC)   Elevated troponin   Elevated transaminase level   Fall at home   Acute right hip pain  COVID 19 Infection Fever Fever suspected in setting of COVID 19 infection Could have contributed to fall with generalized weakness O2 sat generally above 95%, will hold off on any antivirals at this time Blood cultures pending collection Significantly elevated inflammatory markers - related to covid? Normal white count, lower suspicion for bacterial illness  Rhabdomyolysis UA with hemoglobin, 0-5 RBC - suggestive of myoglobinuria Continue IVF, will trend CK (improving)  Right Hip Pain Right Ankle Pain Lower Extremity Pain At home using walker at times and cane at times Refusing Verbena Boeding good portion of exam, difficult - today she threatened to throw remote at me.  When asked where her pain was today, gave inconsistent responses.  Was more TTP today to L knee. 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  Pain subjectively seems to be migratory (earlier had CT R knee for her pain, but now is c/o more L Knee pain).  Today R knee pain is main issue.  Appreciate orthopedics for arthrocentesis of R knee effusion.   Elevated Inflammatory Markers Unclear cause - seems out of proportion to covid Follow procalcitonin - 0.3 RF (mildly positive), anti CCP and ana are negative.   Elevated Troponin Not c/w ACS EKG reassuring Possibly related to rhabdo  AKI on CKD IIIa resolved  Dementia Delirium precautions This has made our exam/evaluation difficult  Delevated AST In setting of rhabdo  - mild  Obesity Body mass index is 47.12 kg/m.    DVT prophylaxis: heparin  Code Status: full Family Communication: son, Mr. Kotara 1/8 in hall before I saw his mother - called 1/8 pm to update, but no answer Disposition:   Status is: Inpatient Remains inpatient appropriate because: need for continued inpatient care   Consultants:  none  Procedures:  none  Antimicrobials:  Anti-infectives (From admission, onward)    None       Subjective:  She continues to not want me to touch her leg  Objective: Vitals:   05/09/24 0013 05/09/24 0442 05/09/24 0856 05/09/24 1209  BP: 117/71 139/77 (!) 149/74 (!) 184/67  Pulse: 64 66 62 63  Resp: 17  16 17   Temp: 98.1 F (36.7 C) 98.2 F (36.8 C) 98 F (36.7 C) 98.4 F (36.9 C)  TempSrc: Oral Oral Oral  Oral  SpO2: 99% 100% 100% 100%  Weight:      Height:        Intake/Output Summary (Last 24 hours) at 05/09/2024 1529 Last data filed at 05/09/2024 1211 Gross per 24 hour  Intake 118 ml  Output 1925 ml  Net -1807 ml   Filed Weights   05/03/24 1735  Weight: 120.7 kg    Examination:  Remains [name] difficult exam - not participating much  General: No acute distress. Lungs: unlabored Neurological: Alert, but confused. Moves all  extremities 4. Cranial nerves II through XII grossly intact. Extremities: R knee swollen compared to baseline (son in room)  Data Reviewed: I have personally reviewed following labs and imaging studies  CBC: Recent Labs  Lab 05/03/24 2120 05/04/24 0235 05/05/24 0213 05/06/24 0258 05/07/24 1016 05/08/24 0547  WBC 6.1 6.0 7.4 8.2 7.7 7.4  NEUTROABS 4.9  --   --  5.6 6.0 4.8  HGB 14.7 15.5* 12.7 11.0* 10.4* 9.7*  HCT 44.6 47.4* 37.6 34.2* 31.8* 29.0*  MCV 82.1 82.3 81.2 84.7 82.0 80.3  PLT 161 137* 146* 128* 143* 176    Basic Metabolic Panel: Recent Labs  Lab 05/04/24 0235 05/05/24 0213 05/06/24 0258 05/07/24 1016 05/08/24 0547 05/09/24 1248  NA 139 138 138 138 133* 134*  K 4.0 3.9 4.0 3.8 4.0 4.5  CL 103 107 107 109 104 106  CO2 22 21* 23 19* 22 17*  GLUCOSE 120* 96 95 117* 101* 102*  BUN 37* 38* 32* 26* 24* 18  CREATININE 1.58* 1.32* 1.33* 0.99 0.91 0.90  CALCIUM 9.4 8.3* 8.1* 8.1* 8.0* 8.8*  MG 2.5* 2.4 2.3  --  2.3  --   PHOS 3.9 2.8 2.2*  --  2.2*  --     GFR: Estimated Creatinine Clearance: 65.9 mL/min (by C-G formula based on SCr of 0.9 mg/dL).  Liver Function Tests: Recent Labs  Lab 05/05/24 0213 05/06/24 0258 05/07/24 1016 05/08/24 0547 05/09/24 1248  AST 201* 139* 98* 69* 72*  ALT 33 32 28 25 25   ALKPHOS 47 42 50 60 109  BILITOT 0.7 0.5 0.5 0.5 1.0  PROT 6.1* 5.8* 5.5* 5.4* 6.3*  ALBUMIN  3.1* 2.9* 2.4* 2.4* 2.2*    CBG: No results for input(s): GLUCAP in the last 168 hours.   Recent Results (from the past 240 hours)  Resp panel by RT-PCR (RSV, Flu [name]&B, Covid) Anterior Nasal Swab     Status: Abnormal   Collection Time: 05/05/24  7:40 AM   Specimen: Anterior Nasal Swab  Result Value Ref Range Status   SARS Coronavirus 2 by RT PCR POSITIVE (Amiylah Anastos) NEGATIVE Final   Influenza Jamicheal Heard by PCR NEGATIVE NEGATIVE Final   Influenza B by PCR NEGATIVE NEGATIVE Final    Comment: (NOTE) The Xpert Xpress SARS-CoV-2/FLU/RSV plus assay is intended as an  aid in the diagnosis of influenza from Nasopharyngeal swab specimens and should not be used as Jenafer Winterton sole basis for treatment. Nasal washings and aspirates are unacceptable for Xpert Xpress SARS-CoV-2/FLU/RSV testing.  Fact Sheet for Patients: bloggercourse.com  Fact Sheet for Healthcare Providers: seriousbroker.it  This test is not yet approved or cleared by the [place]  FDA and has been authorized for detection and/or diagnosis of SARS-CoV-2 by FDA under an Emergency Use Authorization (EUA). This EUA will remain in effect (meaning this test can be used) for the duration of the COVID-19 declaration under Section 564(b)(1) of the Act, 21 U.S.C. section 360bbb-3(b)(1), unless the authorization is terminated or revoked.  Resp Syncytial Virus by PCR NEGATIVE NEGATIVE Final    Comment: (NOTE) Fact Sheet for Patients: bloggercourse.com  Fact Sheet for Healthcare Providers: seriousbroker.it  This test is not yet approved or cleared by the [place]  FDA and has been authorized for detection and/or diagnosis of SARS-CoV-2 by FDA under an Emergency Use Authorization (EUA). This EUA will remain in effect (meaning this test can be used) for the duration of the COVID-19 declaration under Section 564(b)(1) of the Act, 21 U.S.C. section 360bbb-3(b)(1), unless the authorization is terminated or revoked.  Performed at [facility], [address], [place], [place] [address]   Culture, blood (Routine X 2) w Reflex to ID Panel     Status: None (Preliminary result)   Collection Time: 05/06/24 11:37 AM   Specimen: BLOOD LEFT HAND  Result Value Ref Range Status   Specimen Description BLOOD LEFT HAND  Final   Special Requests   Final    BOTTLES DRAWN AEROBIC ONLY Blood Culture results may not be optimal due to an inadequate volume of blood received in culture bottles   Culture    Final    NO GROWTH 3 DAYS Performed at [facility], [address] [place], [place] [address]    Report Status PENDING  Incomplete  Culture, blood (Routine X 2) w Reflex to ID Panel     Status: None (Preliminary result)   Collection Time: 05/06/24 11:43 AM   Specimen: BLOOD RIGHT ARM  Result Value Ref Range Status   Specimen Description BLOOD RIGHT ARM  Final   Special Requests   Final    BOTTLES DRAWN AEROBIC AND ANAEROBIC Blood Culture adequate volume   Culture   Final    NO GROWTH 3 DAYS Performed at [facility], [address], [place], [place] [address]    Report Status PENDING  Incomplete         Radiology Studies: DG Knee 1-2 Views Left Result Date: 05/07/2024 EXAM: 1 or 2 VIEW(S) XRAY OF THE LEFT KNEE 05/07/2024 08:43:00 PM COMPARISON: Left knee x ray 12/09/2023. CLINICAL HISTORY: Pain. FINDINGS: BONES AND JOINTS: Left total knee arthroplasty noted. Intact orthopedic hardware without periprosthetic fracture or lucency. No malalignment. No significant joint effusion. SOFT TISSUES: Vascular calcifications. IMPRESSION: 1. Left total knee arthroplasty with intact orthopedic hardware without periprosthetic fracture or lucency. Electronically signed by: Greig Pique MD MD 05/07/2024 10:45 PM EST RP Workstation: [id number]        Scheduled Meds:  acetaminophen   1,000 mg Oral Q8H   heparin   5,000 Units Subcutaneous Q8H   Continuous Infusions:     LOS: 5 days    Time spent: over 30 min     Meliton Monte, MD [facility]   To contact the attending provider between 7A-7P or the covering provider during after hours 7P-7A, please log into the web site [email or web address] and access using universal [place] password for that web site. If you do not have the password, please call the hospital operator.  05/09/2024, 3:29 PM    "

## 2024-05-09 NOTE — Progress Notes (Signed)
 Pt moved onto Specialty Bed/Air Mattress.  Pt continues to refuse q2h Turns.  Will continue to monitor.

## 2024-05-10 ENCOUNTER — Inpatient Hospital Stay (HOSPITAL_COMMUNITY)

## 2024-05-10 DIAGNOSIS — Y92009 Unspecified place in unspecified non-institutional (private) residence as the place of occurrence of the external cause: Secondary | ICD-10-CM | POA: Diagnosis not present

## 2024-05-10 DIAGNOSIS — W19XXXA Unspecified fall, initial encounter: Secondary | ICD-10-CM | POA: Diagnosis not present

## 2024-05-10 DIAGNOSIS — M6282 Rhabdomyolysis: Secondary | ICD-10-CM | POA: Diagnosis not present

## 2024-05-10 MED ORDER — LORAZEPAM 0.5 MG PO TABS
0.5000 mg | ORAL_TABLET | Freq: Two times a day (BID) | ORAL | Status: DC | PRN
  Administered 2024-05-10 – 2024-05-12 (×3): 0.5 mg via ORAL
  Filled 2024-05-10 (×3): qty 1

## 2024-05-10 NOTE — Plan of Care (Signed)
  Problem: Education: Goal: Knowledge of General Education information will improve Description: Including pain rating scale, medication(s)/side effects and non-pharmacologic comfort measures Outcome: Progressing   Problem: Health Behavior/Discharge Planning: Goal: Ability to manage health-related needs will improve Outcome: Progressing   Problem: Clinical Measurements: Goal: Ability to maintain clinical measurements within normal limits will improve Outcome: Progressing Goal: Will remain free from infection Outcome: Progressing Goal: Diagnostic test results will improve Outcome: Progressing Goal: Respiratory complications will improve Outcome: Progressing Goal: Cardiovascular complication will be avoided Outcome: Progressing   Problem: Activity: Goal: Risk for activity intolerance will decrease Outcome: Progressing   Problem: Nutrition: Goal: Adequate nutrition will be maintained Outcome: Progressing   Problem: Coping: Goal: Level of anxiety will decrease Outcome: Progressing   Problem: Elimination: Goal: Will not experience complications related to bowel motility Outcome: Progressing Goal: Will not experience complications related to urinary retention Outcome: Progressing   Problem: Pain Managment: Goal: General experience of comfort will improve and/or be controlled Outcome: Progressing   Problem: Safety: Goal: Ability to remain free from injury will improve Outcome: Progressing   Problem: Skin Integrity: Goal: Risk for impaired skin integrity will decrease Outcome: Progressing   Problem: Education: Goal: Knowledge of risk factors and measures for prevention of condition will improve Outcome: Progressing   Problem: Coping: Goal: Psychosocial and spiritual needs will be supported Outcome: Progressing   Problem: Respiratory: Goal: Will maintain a patent airway Outcome: Progressing Goal: Complications related to the disease process, condition or treatment  will be avoided or minimized Outcome: Progressing

## 2024-05-10 NOTE — Progress Notes (Signed)
 Physical Therapy Treatment Patient Details Name: [name] MRN: [id number] DOB: Feb 09, 1946 Today's Date: 05/10/2024   History of Present Illness 79 y.o. female presents to [facility] 05/04/23 with R hip pain after unwitnessed fall. Pt was on the ground for <1 hrs with rhabdomyolysis and AKI. Imaging negative, COVID+. PMHx: dementia, hypertension, and hyperlipidemia    PT Comments  Patient with limited progress due to easily agitated and c/o pain.  She was able to roll with +2 total A and RN changed bed linen and assisted with hygiene.  Son in the room and encouraging pt as best as he could.  She would ask to be given time and she would perform movement on her own though when given time she would request her son to help her.  She was pre-medicated per RN with IV meds.  Feel she may need additional support due to anxiety.  Also potentially if she can tolerate being lifted to a chair she may be able to move herself more though she would be super fearful of the lift.  PT will continue to follow.     If plan is discharge home, recommend the following: Assistance with cooking/housework;Assist for transportation;Help with stairs or ramp for entrance;Direct supervision/assist for financial management;Direct supervision/assist for medications management;Two people to help with walking and/or transfers;Two people to help with bathing/dressing/bathroom;Supervision due to cognitive status   Can travel by private vehicle     No  Equipment Recommendations  BSC/3in1;Wheelchair (measurements PT);Wheelchair cushion (measurements PT);Hoyer lift    Recommendations for [employer]       Precautions / Restrictions Precautions Precautions: Fall Recall of Precautions/Restrictions: Impaired Precaution/Restrictions Comments: Covid+ airborne Dispensing Optician Overal bed mobility: Needs Assistance Bed Mobility: Rolling Rolling: Total assist, Used rails, +2 for physical assistance          General bed mobility comments: assist for rolling for changing bed linen and for hygiene with RN in the room    Transfers                   General transfer comment: unable    Ambulation/Gait                   Stairs             Wheelchair Mobility     Tilt Bed    Modified Rankin (Stroke Patients Only)       Balance                                            Communication Communication Communication: No apparent difficulties  Cognition Arousal: Alert Behavior During Therapy: Anxious, Lability   PT - Cognitive impairments: History of cognitive impairments, Problem solving, Safety/Judgement, No apparent impairments, Memory                       PT - Cognition Comments: fearful and yelling when people help her move, not striking out, though grabs the rails and tenses up making movement difficult; says she will do it if we give her time, though when we give her time she calls her son over to help her do it Following commands: Impaired Following commands impaired: Follows one step commands inconsistently, Follows one step commands with increased time    Cueing Cueing Techniques: Verbal cues, Tactile cues  Exercises  General Exercises - Lower Extremity Ankle Circles/Pumps: AROM, AAROM, Both, 5 reps, Supine Other Exercises Other Exercises: assisted with hip abduction bilateral for RN to help with perineal hygiene, pt c/o pain throughout    General Comments General comments (skin integrity, edema, etc.): assist for hygiene, RN changed purewick, washed perineal area and changed bed linen; son in the room to encourage pt as able      Pertinent Vitals/Pain Pain Assessment Faces Pain Scale: Hurts whole lot Pain Location: hips with rolling in the bed Pain Descriptors / Indicators: Grimacing, Moaning, Aching, Tender Pain Intervention(s): Premedicated before session, Repositioned, Limited activity within patient's tolerance     Home Living                          Prior Function            PT Goals (current goals can now be found in the care plan section) Progress towards PT goals: Not progressing toward goals - comment    Frequency    Min 2X/week      PT Plan      Co-evaluation              AM-PAC PT 6 Clicks Mobility   Outcome Measure  Help needed turning from your back to your side while in a flat bed without using bedrails?: Total Help needed moving from lying on your back to sitting on the side of a flat bed without using bedrails?: Total Help needed moving to and from a bed to a chair (including a wheelchair)?: Total Help needed standing up from a chair using your arms (e.g., wheelchair or bedside chair)?: Total Help needed to walk in hospital room?: Total Help needed climbing 3-5 steps with a railing? : Total 6 Click Score: 6    End of Session   Activity Tolerance: Patient limited by pain;Treatment limited secondary to agitation Patient left: in bed;with call bell/phone within reach;with family/visitor present   PT Visit Diagnosis: Unsteadiness on feet (R26.81);Other abnormalities of gait and mobility (R26.89);Muscle weakness (generalized) (M62.81);History of falling (Z91.81)     Time: 1600-1630 PT Time Calculation (min) (ACUTE ONLY): 30 min  Charges:    $Therapeutic Activity: 23-37 mins PT General Charges $$ ACUTE PT VISIT: 1 Visit                     Micheline Portal, PT Acute Rehabilitation Services Office:[phone] 05/10/2024    Montie Portal 05/10/2024, 5:33 PM

## 2024-05-10 NOTE — TOC Progression Note (Signed)
 Transition of Care [facility]) - Progression Note    Patient Details  Name: NAINIKA NEWLUN MRN: [id number] Date of Birth: [birth year]  Transition of [facility]) CM/SW Contact  Sherline Clack, [place] Phone Number: 05/10/2024, 12:51 PM  Clinical Narrative:     CSW spoke with son over the phone to discuss SNF bed offers. Son would like for patient to go to [facility] and requested CSW accept bed offer in the HUB. CSW accepted bed offer and reached out to facility regarding insurance auth process. Tanya at [facility] will get back with CSW to let her know when to start insurance auth. CSW will continue to follow.   Expected Discharge Plan: Skilled Nursing Facility Barriers to Discharge: SNF Pending bed offer, Insurance Authorization               Expected Discharge Plan and Services       Living arrangements for the past 2 months: Apartment                                       Social Drivers of Health (SDOH) Interventions SDOH Screenings   Food Insecurity: No Food Insecurity (05/04/2024)  Housing: Low Risk (05/04/2024)  Transportation Needs: Unknown (05/04/2024)  Utilities: Not At Risk (05/04/2024)  Social Connections: Moderately Integrated (05/04/2024)  Tobacco Use: Low Risk (05/04/2024)    Readmission Risk Interventions     No data to display

## 2024-05-10 NOTE — Progress Notes (Addendum)
 " PROGRESS NOTE    [name]  FMW:[id number] DOB: 11/03/1945 DOA: 05/03/2024 PCP: [facility]  Chief Complaint  Patient presents with   [place]    Brief Narrative:   [name] is Hunter Bachar 79 y.o. female with medical history significant for dementia, hypertension, and hyperlipidemia who presents with right hip pain after an unwitnessed fall at home.   Assessment & Plan:   Principal Problem:   Rhabdomyolysis Active Problems:   Essential hypertension   CKD stage 3a, GFR 45-59 ml/min (HCC)   Dementia (HCC)   Elevated troponin   Elevated transaminase level   Fall at home   Acute right hip pain  COVID 19 Infection Fever Fever suspected in setting of COVID 19 infection Could have contributed to fall with generalized weakness O2 sat generally above 95%, will hold off on any antivirals at this time Blood cultures pending collection Significantly elevated inflammatory markers - related to covid? Normal white count, lower suspicion for bacterial illness  Rhabdomyolysis UA with hemoglobin, 0-5 RBC - suggestive of myoglobinuria Continue IVF, will trend CK (improving)  Right Hip Pain Right Ankle Pain Lower Extremity Pain At home using walker at times and cane at times - living with son Refusing Saralynn Langhorst good portion of exam, difficult - today she threatened to throw remote at me.  When asked where her pain was today, gave inconsistent responses.  Was more TTP today to L knee. CT pelvis with severe bone on bone L hip arthrosis with juxtaarticular cystic changes  CT R knee with well seated hardware - small to moderate knee joint effusion Plain films of R knee and ankle with large plantar calcaneal spur with adjacent bone fragment, priminent plantar calcaneal spur with adjacent fragmentation Plain film R femur with enthesophyte at superior pole of patella with fragmentation - possible avulsion injury at superior pole of patella CT right ankle without notable acute findings (see  report) Plain films L knee with intact hardware without periprosthetic fx or lucency As noted before, exam has been difficult.  Pain subjectively seems to be migratory (earlier had CT R knee for her pain, but now is c/o more L Knee pain).  Today L knee pain is main issue.  Appreciate orthopedics for arthrocentesis of R knee effusion.   Elevated Inflammatory Markers Unclear cause - seems out of proportion to covid Follow procalcitonin - 0.3 RF (mildly positive), anti CCP and ana are negative.   Elevated Troponin Not c/w ACS EKG reassuring Possibly related to rhabdo  AKI on CKD IIIa resolved  Dementia Delirium precautions This has made our exam/evaluation difficult  Delevated AST In setting of rhabdo  - mild  Obesity, Class III Body mass index is 47.12 kg/m.  Working on SNF    DVT prophylaxis: heparin  Code Status: full Family Communication: son, Mr. Guizar 1/8 in hall before I saw his mother - called 1/8 pm to update, but no answer Disposition:   Status is: Inpatient Remains inpatient appropriate because: need for continued inpatient care   Consultants:  none  Procedures:  none  Antimicrobials:  Anti-infectives (From admission, onward)    None       Subjective:  Confused Complaining of pain in her L leg today  Objective: Vitals:   05/10/24 0029 05/10/24 0419 05/10/24 0836 05/10/24 1223  BP: (!) 155/88 (!) 152/110 136/65 124/88  Pulse: 64 86 69 68  Resp: 17 18 16 18   Temp: 98.4 F (36.9 C) (!) 97.4 F (36.3 C) 97.8 F (36.6  C)   TempSrc:      SpO2: 100% 100% 97% 99%  Weight:      Height:        Intake/Output Summary (Last 24 hours) at 05/10/2024 1322 Last data filed at 05/10/2024 0022 Gross per 24 hour  Intake --  Output 1300 ml  Net -1300 ml   Filed Weights   05/03/24 1735  Weight: 120.7 kg    Examination:  Similar to previous days, she very resistant to msk exam  General: No acute distress. Cardiovascular: RRR Lungs:  unlabored Neurological: Alert, but confused. Moves all extremities 4. Cranial nerves II through XII grossly intact. Extremities: R knee swelling - not particularly TTP   Data Reviewed: I have personally reviewed following labs and imaging studies  CBC: Recent Labs  Lab 05/03/24 2120 05/04/24 0235 05/05/24 0213 05/06/24 0258 05/07/24 1016 05/08/24 0547  WBC 6.1 6.0 7.4 8.2 7.7 7.4  NEUTROABS 4.9  --   --  5.6 6.0 4.8  HGB 14.7 15.5* 12.7 11.0* 10.4* 9.7*  HCT 44.6 47.4* 37.6 34.2* 31.8* 29.0*  MCV 82.1 82.3 81.2 84.7 82.0 80.3  PLT 161 137* 146* 128* 143* 176    Basic Metabolic Panel: Recent Labs  Lab 05/04/24 0235 05/05/24 0213 05/06/24 0258 05/07/24 1016 05/08/24 0547 05/09/24 1248  NA 139 138 138 138 133* 134*  K 4.0 3.9 4.0 3.8 4.0 4.5  CL 103 107 107 109 104 106  CO2 22 21* 23 19* 22 17*  GLUCOSE 120* 96 95 117* 101* 102*  BUN 37* 38* 32* 26* 24* 18  CREATININE 1.58* 1.32* 1.33* 0.99 0.91 0.90  CALCIUM 9.4 8.3* 8.1* 8.1* 8.0* 8.8*  MG 2.5* 2.4 2.3  --  2.3  --   PHOS 3.9 2.8 2.2*  --  2.2*  --     GFR: Estimated Creatinine Clearance: 65.9 mL/min (by C-G formula based on SCr of 0.9 mg/dL).  Liver Function Tests: Recent Labs  Lab 05/05/24 0213 05/06/24 0258 05/07/24 1016 05/08/24 0547 05/09/24 1248  AST 201* 139* 98* 69* 72*  ALT 33 32 28 25 25   ALKPHOS 47 42 50 60 109  BILITOT 0.7 0.5 0.5 0.5 1.0  PROT 6.1* 5.8* 5.5* 5.4* 6.3*  ALBUMIN  3.1* 2.9* 2.4* 2.4* 2.2*    CBG: No results for input(s): GLUCAP in the last 168 hours.   Recent Results (from the past 240 hours)  Resp panel by RT-PCR (RSV, Flu Deen Deguia&B, Covid) Anterior Nasal Swab     Status: Abnormal   Collection Time: 05/05/24  7:40 AM   Specimen: Anterior Nasal Swab  Result Value Ref Range Status   SARS Coronavirus 2 by RT PCR POSITIVE (Hashir Deleeuw) NEGATIVE Final   Influenza Vergil Burby by PCR NEGATIVE NEGATIVE Final   Influenza B by PCR NEGATIVE NEGATIVE Final    Comment: (NOTE) The Xpert Xpress  SARS-CoV-2/FLU/RSV plus assay is intended as an aid in the diagnosis of influenza from Nasopharyngeal swab specimens and should not be used as [place] sole basis for treatment. Nasal washings and aspirates are unacceptable for Xpert Xpress SARS-CoV-2/FLU/RSV testing.  Fact Sheet for Patients: bloggercourse.com  Fact Sheet for Healthcare Providers: seriousbroker.it  This test is not yet approved or cleared by the [place]  FDA and has been authorized for detection and/or diagnosis of SARS-CoV-2 by FDA under an Emergency Use Authorization (EUA). This EUA will remain in effect (meaning this test can be used) for the duration of the COVID-19 declaration under Section 564(b)(1) of the Act, 21 U.S.C.  section 360bbb-3(b)(1), unless the authorization is terminated or revoked.     Resp Syncytial Virus by PCR NEGATIVE NEGATIVE Final    Comment: (NOTE) Fact Sheet for Patients: bloggercourse.com  Fact Sheet for Healthcare Providers: seriousbroker.it  This test is not yet approved or cleared by the [place]  FDA and has been authorized for detection and/or diagnosis of SARS-CoV-2 by FDA under an Emergency Use Authorization (EUA). This EUA will remain in effect (meaning this test can be used) for the duration of the COVID-19 declaration under Section 564(b)(1) of the Act, 21 U.S.C. section 360bbb-3(b)(1), unless the authorization is terminated or revoked.  Performed at [facility]: [facility], [address], [place], [place] [address]   Culture, blood (Routine X 2) w Reflex to ID Panel     Status: None (Preliminary result)   Collection Time: 05/06/24 11:37 AM   Specimen: BLOOD LEFT HAND  Result Value Ref Range Status   Specimen Description BLOOD LEFT HAND  Final   Special Requests   Final    BOTTLES DRAWN AEROBIC ONLY Blood Culture results may not be optimal due to an inadequate volume of  blood received in culture bottles   Culture   Final    NO GROWTH 4 DAYS Performed at [facility], [address], [place], [place] [address]    Report Status PENDING  Incomplete  Culture, blood (Routine X 2) w Reflex to ID Panel     Status: None (Preliminary result)   Collection Time: 05/06/24 11:43 AM   Specimen: BLOOD RIGHT ARM  Result Value Ref Range Status   Specimen Description BLOOD RIGHT ARM  Final   Special Requests   Final    BOTTLES DRAWN AEROBIC AND ANAEROBIC Blood Culture adequate volume   Culture   Final    NO GROWTH 4 DAYS Performed at [facility], [address], [place], [place] [address]    Report Status PENDING  Incomplete  Aerobic/Anaerobic Culture w Gram Stain (surgical/deep wound)     Status: None (Preliminary result)   Collection Time: 05/09/24  1:59 PM   Specimen: Synovium  Result Value Ref Range Status   Specimen Description SYNOVIAL  Final   Special Requests NONE  Final   Gram Stain   Final    RARE WBC PRESENT,BOTH PMN AND MONONUCLEAR NO ORGANISMS SEEN    Culture   Final    NO GROWTH < 24 HOURS Performed at [facility], [address], [place], [place] [address]    Report Status PENDING  Incomplete         Radiology Studies: No results found.       Scheduled Meds:  acetaminophen   1,000 mg Oral Q8H   heparin   5,000 Units Subcutaneous Q8H   Continuous Infusions:     LOS: 6 days    Time spent: over 30 min     Meliton Monte, MD [facility]   To contact the attending provider between 7A-7P or the covering provider during after hours 7P-7A, please log into the web site [email or web address] and access using universal [place] password for that web site. If you do not have the password, please call the hospital operator.  05/10/2024, 1:22 PM    "

## 2024-05-11 DIAGNOSIS — M25551 Pain in right hip: Secondary | ICD-10-CM | POA: Diagnosis not present

## 2024-05-11 LAB — COMPREHENSIVE METABOLIC PANEL WITH GFR
ALT: 34 U/L (ref 0–44)
AST: 67 U/L — ABNORMAL HIGH (ref 15–41)
Albumin: 2.4 g/dL — ABNORMAL LOW (ref 3.5–5.0)
Alkaline Phosphatase: 184 U/L — ABNORMAL HIGH (ref 38–126)
Anion gap: 9 (ref 5–15)
BUN: 21 mg/dL (ref 8–23)
CO2: 23 mmol/L (ref 22–32)
Calcium: 8.6 mg/dL — ABNORMAL LOW (ref 8.9–10.3)
Chloride: 105 mmol/L (ref 98–111)
Creatinine, Ser: 1.04 mg/dL — ABNORMAL HIGH (ref 0.44–1.00)
GFR, Estimated: 55 mL/min — ABNORMAL LOW
Glucose, Bld: 113 mg/dL — ABNORMAL HIGH (ref 70–99)
Potassium: 4.4 mmol/L (ref 3.5–5.1)
Sodium: 136 mmol/L (ref 135–145)
Total Bilirubin: 0.6 mg/dL (ref 0.0–1.2)
Total Protein: 5.6 g/dL — ABNORMAL LOW (ref 6.5–8.1)

## 2024-05-11 LAB — CBC WITH DIFFERENTIAL/PLATELET
Abs Immature Granulocytes: 0.33 K/uL — ABNORMAL HIGH (ref 0.00–0.07)
Basophils Absolute: 0 K/uL (ref 0.0–0.1)
Basophils Relative: 0 %
Eosinophils Absolute: 0.2 K/uL (ref 0.0–0.5)
Eosinophils Relative: 2 %
HCT: 29.6 % — ABNORMAL LOW (ref 36.0–46.0)
Hemoglobin: 9.8 g/dL — ABNORMAL LOW (ref 12.0–15.0)
Immature Granulocytes: 4 %
Lymphocytes Relative: 16 %
Lymphs Abs: 1.3 K/uL (ref 0.7–4.0)
MCH: 26.9 pg (ref 26.0–34.0)
MCHC: 33.1 g/dL (ref 30.0–36.0)
MCV: 81.3 fL (ref 80.0–100.0)
Monocytes Absolute: 0.7 K/uL (ref 0.1–1.0)
Monocytes Relative: 8 %
Neutro Abs: 6 K/uL (ref 1.7–7.7)
Neutrophils Relative %: 70 %
Platelets: 291 K/uL (ref 150–400)
RBC: 3.64 MIL/uL — ABNORMAL LOW (ref 3.87–5.11)
RDW: 14.8 % (ref 11.5–15.5)
WBC: 8.5 K/uL (ref 4.0–10.5)
nRBC: 0 % (ref 0.0–0.2)

## 2024-05-11 LAB — C-REACTIVE PROTEIN: CRP: 13.7 mg/dL — ABNORMAL HIGH

## 2024-05-11 LAB — MAGNESIUM: Magnesium: 2 mg/dL (ref 1.7–2.4)

## 2024-05-11 LAB — CULTURE, BLOOD (ROUTINE X 2)
Culture: NO GROWTH
Culture: NO GROWTH
Special Requests: ADEQUATE

## 2024-05-11 LAB — CK: Total CK: 1210 U/L — ABNORMAL HIGH (ref 38–234)

## 2024-05-11 LAB — PHOSPHORUS: Phosphorus: 3.6 mg/dL (ref 2.5–4.6)

## 2024-05-11 MED ORDER — GUAIFENESIN 200 MG PO TABS
200.0000 mg | ORAL_TABLET | ORAL | Status: DC | PRN
  Administered 2024-05-11: 200 mg via ORAL
  Filled 2024-05-11 (×3): qty 1

## 2024-05-11 NOTE — Plan of Care (Signed)
  Problem: Education: Goal: Knowledge of General Education information will improve Description: Including pain rating scale, medication(s)/side effects and non-pharmacologic comfort measures Outcome: Progressing   Problem: Health Behavior/Discharge Planning: Goal: Ability to manage health-related needs will improve Outcome: Progressing   Problem: Clinical Measurements: Goal: Ability to maintain clinical measurements within normal limits will improve Outcome: Progressing Goal: Will remain free from infection Outcome: Progressing Goal: Diagnostic test results will improve Outcome: Progressing Goal: Respiratory complications will improve Outcome: Progressing Goal: Cardiovascular complication will be avoided Outcome: Progressing   Problem: Activity: Goal: Risk for activity intolerance will decrease Outcome: Progressing   Problem: Nutrition: Goal: Adequate nutrition will be maintained Outcome: Progressing   Problem: Coping: Goal: Level of anxiety will decrease Outcome: Progressing   Problem: Elimination: Goal: Will not experience complications related to bowel motility Outcome: Progressing Goal: Will not experience complications related to urinary retention Outcome: Progressing   Problem: Pain Managment: Goal: General experience of comfort will improve and/or be controlled Outcome: Progressing   Problem: Safety: Goal: Ability to remain free from injury will improve Outcome: Progressing   Problem: Skin Integrity: Goal: Risk for impaired skin integrity will decrease Outcome: Progressing   Problem: Education: Goal: Knowledge of risk factors and measures for prevention of condition will improve Outcome: Progressing   Problem: Coping: Goal: Psychosocial and spiritual needs will be supported Outcome: Progressing   Problem: Respiratory: Goal: Will maintain a patent airway Outcome: Progressing Goal: Complications related to the disease process, condition or treatment  will be avoided or minimized Outcome: Progressing

## 2024-05-11 NOTE — Treatment Plan (Addendum)
 Occupational Therapy Treatment Patient Details Name: [name] MRN: [id number] DOB: June 19, 1945 Today's Date: 05/11/2024   History of present illness 79 y.o. female presents to [facility] 05/04/23 with R hip pain after unwitnessed fall. Pt was on the ground for <1 hrs with rhabdomyolysis and AKI. Imaging negative, COVID+. PMHx: dementia, hypertension, and hyperlipidemia   OT comments  Pt refusing to participate initially with any mobility. Pt completed grooming setup with hob increased slightly. Pt premedicated ahead of session to ensure pain management. Pt limited participation and will attempt a secondary therapist to see if this helps with rapport for participation. Pt will have reduced frequency if not increased engagement next session. Recommendation for skilled inpatient follow up therapy, <3 hours/day.       If plan is discharge home, recommend the following:  Two people to help with bathing/dressing/bathroom   Equipment Recommendations  Wheelchair (measurements OT);Wheelchair cushion (measurements OT);Hospital bed;Hoyer lift (bed pan)    Recommendations for Other Services      Precautions / Restrictions Precautions Precautions: Fall Recall of Precautions/Restrictions: Impaired Precaution/Restrictions Comments: Covid+ airborne precs Restrictions Weight Bearing Restrictions Per Provider Order: No       Mobility Bed Mobility Overal bed mobility: Needs Assistance Bed Mobility: Rolling Rolling: Total assist, +2 for physical assistance, +2 for safety/equipment         General bed mobility comments: rolling toward L side due to L leg hurts the most with pillow placed between knees. pt positioned on side and tolerating well at the end of session HOB ~30 degrees prefers no hip flexion    Transfers                   General transfer comment: not appropriate as pt unwilling to participate and verbally threating staff to strike     Balance                                            ADL either performed or assessed with clinical judgement   ADL Overall ADL's : Needs assistance/impaired Eating/Feeding: Minimal assistance;Bed level   Grooming: Wash/dry hands;Modified independent;Bed level   Upper Body Bathing: Maximal assistance   Lower Body Bathing: Total assistance   Upper Body Dressing : Maximal assistance   Lower Body Dressing: Total assistance                 General ADL Comments: pt needed 30 minutes session to get pt to roll toward the L side with pillows placed behind patient for further pressure relief of R buttock \\with  wound present. pt with sacral dressing from 1/9 on    Extremity/Trunk Assessment Upper Extremity Assessment Upper Extremity Assessment: Overall WFL for tasks assessed   Lower Extremity Assessment Lower Extremity Assessment: Defer to PT evaluation        Vision       Perception     Praxis     Communication Communication Communication: No apparent difficulties   Cognition Arousal: Alert Behavior During Therapy: Anxious, Agitated Cognition: History of cognitive impairments, No family/caregiver present to determine baseline, Cognition impaired     Awareness: Intellectual awareness impaired, Online awareness impaired Memory impairment (select all impairments): Short-term memory     OT - Cognition Comments: pt never met OT but insist she knows the therapist . pt related better to tech in room and following her commands more briskly  Following commands: Impaired Following commands impaired: Follows one step commands inconsistently, Follows one step commands with increased time      Cueing   Cueing Techniques: Verbal cues, Visual cues  Exercises      Shoulder Instructions       General Comments skin wound on R buttock noted    Pertinent Vitals/ Pain       Pain Assessment Pain Assessment: Faces Faces Pain Scale: Hurts even more Pain Location: hips  with rolling in the bed Pain Descriptors / Indicators: Grimacing, Moaning, Aching, Tender Pain Intervention(s): Premedicated before session, Monitored during session, Limited activity within patient's tolerance, Repositioned  Home Living Family/patient expects to be discharged to:: Skilled nursing facility                                        Prior Functioning/Environment              Frequency  Min 2X/week (trial will drop to 1x per week if pt does not participate more)        Progress Toward Goals  OT Goals(current goals can now be found in the care plan section)  Progress towards OT goals: Not progressing toward goals - comment  Acute Rehab OT Goals Patient Stated Goal: dont touch me. i am getting out of this hospital OT Goal Formulation: Patient unable to participate in goal setting Time For Goal Achievement: 05/20/24 Potential to Achieve Goals: Poor ADL Goals Pt Will Perform Eating: with set-up;sitting Pt Will Perform Grooming: with set-up;sitting (completed this bed with hob 45degrees today) Pt Will Perform Upper Body Dressing: with min assist;sitting Pt Will Transfer to Toilet: with mod assist;with +2 assist;ambulating;bedside commode  Plan      Co-evaluation                 AM-PAC OT 6 Clicks Daily Activity     Outcome Measure   Help from another person eating meals?: A Little Help from another person taking care of personal grooming?: A Lot Help from another person toileting, which includes using toliet, bedpan, or urinal?: Total Help from another person bathing (including washing, rinsing, drying)?: Total Help from another person to put on and taking off regular upper body clothing?: Total Help from another person to put on and taking off regular lower body clothing?: Total 6 Click Score: 9    End of Session    OT Visit Diagnosis: History of falling (Z91.81);Muscle weakness (generalized) (M62.81);Pain;Other symptoms and  signs involving cognitive function   Activity Tolerance Patient limited by pain;Other (comment) (anixety of potential pain)   Patient Left in bed;with call bell/phone within reach;with bed alarm set   Nurse Communication Mobility status;Precautions;Need for lift equipment        Time: [phone] OT Time Calculation (min): 22 min  Charges: OT General Charges $OT Visit: 1 Visit OT Treatments $Self Care/Home Management : 8-22 mins   Brynn, OTR/L  Acute Rehabilitation Services Office: [phone] .   Ely Molt 05/11/2024, 11:00 AM

## 2024-05-11 NOTE — Progress Notes (Signed)
 " PROGRESS NOTE    [name]  FMW:[id number] DOB: [birth year] DOA: 05/03/2024 PCP: Health, [facility]  Chief Complaint  Patient presents with   [name]    Brief Narrative:   [name] is Xzaria Teo 79 y.o. female with medical history significant for dementia, hypertension, and hyperlipidemia who presents with right hip pain after an unwitnessed fall at home.   Developed fever and was diagnosed with COVID 19 infection.  Hospitalization has been complicated by significant lower extremity pain limited her mobility in bed and ability to participate with therapy.   Assessment & Plan:   Principal Problem:   Rhabdomyolysis Active Problems:   Essential hypertension   CKD stage 3a, GFR 45-59 ml/min (HCC)   Dementia (HCC)   Elevated troponin   Elevated transaminase level   Fall at home   Acute right hip pain  COVID 19 Infection Fever Fever suspected in setting of COVID 19 infection Could have contributed to fall with generalized weakness O2 sat generally above 95%, will hold off on any antivirals at this time.  No indication for steroids. Blood cultures Ngx5. Significantly elevated inflammatory markers - related to covid?  Seems out of proportion. Normal white count, lower suspicion for bacterial illness  Rhabdomyolysis UA with hemoglobin, 0-5 RBC - suggestive of myoglobinuria Continue IVF, will trend CK (improving)  Right Hip Pain Right Ankle Pain Lower Extremity Pain At home using walker at times and cane at times - living with son exam has been difficult.  Pain subjectively seems to be migratory.  She's not participating well with therapy due to pain.  Extensive imaging has been done.  Arthrocentesis of R knee, not c/w septic arthritis, culture NGTD.  question whether pain is related to soft tissue/ligamentous injury. CT pelvis with severe bone on bone L hip arthrosis with juxtaarticular cystic changes  CT R knee with well seated hardware - small to moderate knee joint  effusion Plain films of R knee and ankle with large plantar calcaneal spur with adjacent bone fragment, priminent plantar calcaneal spur with adjacent fragmentation Plain film R femur with enthesophyte at superior pole of patella with fragmentation - possible avulsion injury at superior pole of patella CT right ankle without notable acute findings (see report) Plain films L knee with intact hardware without periprosthetic fx or lucency CT L spine without acute lumbar spine fx or traumatic listhesis - post op changes noted.  Severe neuroforaminal narrowing.  Bridging osseous fusion/osteophytes. CT abd pelvis without acute abnormality in the abdomen/pelvis.    Elevated Inflammatory Markers Unclear cause - seems out of proportion to covid Downtrending to 13.7 today Follow procalcitonin - 0.3 RF (mildly positive), anti CCP and ana are negative. Will continue to trend   Elevated Troponin Not c/w ACS EKG reassuring Possibly related to rhabdo  AKI on CKD IIIa resolved  Dementia Delirium precautions This has made our exam/evaluation difficult  Delevated AST In setting of rhabdo  - mild  Obesity, Class III Body mass index is 47.12 kg/m.  Working on SNF    DVT prophylaxis: heparin  Code Status: full Family Communication: none at bedside Disposition:   Status is: Inpatient Remains inpatient appropriate because: need for continued inpatient care   Consultants:  none  Procedures:  none  Antimicrobials:  Anti-infectives (From admission, onward)    None       Subjective:  No complaints today  Objective: Vitals:   05/11/24 0455 05/11/24 0759 05/11/24 1209 05/11/24 1406  BP: 125/65 138/61 124/62 124/68  Pulse:  75 66 63 79  Resp: 17 18 18 18   Temp: 98 F (36.7 C) 97.8 F (36.6 C) 97.6 F (36.4 C) 97.9 F (36.6 C)  TempSrc: Oral     SpO2: 100% 98% 100% 100%  Weight:      Height:        Intake/Output Summary (Last 24 hours) at 05/11/2024 1539 Last data  filed at 05/11/2024 1300 Gross per 24 hour  Intake 240 ml  Output --  Net 240 ml   Filed Weights   05/03/24 1735  Weight: 120.7 kg    Examination:  Similar to previous days, she very resistant to msk exam  General: No acute distress. Cardiovascular: RRR Lungs: unlabored Neurological: Alert. Moves all extremities 4 . Cranial nerves II through XII grossly intact. Extremities: no particular TTP   Data Reviewed: I have personally reviewed following labs and imaging studies  CBC: Recent Labs  Lab 05/05/24 0213 05/06/24 0258 05/07/24 1016 05/08/24 0547 05/11/24 0531  WBC 7.4 8.2 7.7 7.4 8.5  NEUTROABS  --  5.6 6.0 4.8 6.0  HGB 12.7 11.0* 10.4* 9.7* 9.8*  HCT 37.6 34.2* 31.8* 29.0* 29.6*  MCV 81.2 84.7 82.0 80.3 81.3  PLT 146* 128* 143* 176 291    Basic Metabolic Panel: Recent Labs  Lab 05/05/24 0213 05/06/24 0258 05/07/24 1016 05/08/24 0547 05/09/24 1248 05/11/24 0531  NA 138 138 138 133* 134* 136  K 3.9 4.0 3.8 4.0 4.5 4.4  CL 107 107 109 104 106 105  CO2 21* 23 19* 22 17* 23  GLUCOSE 96 95 117* 101* 102* 113*  BUN 38* 32* 26* 24* 18 21  CREATININE 1.32* 1.33* 0.99 0.91 0.90 1.04*  CALCIUM 8.3* 8.1* 8.1* 8.0* 8.8* 8.6*  MG 2.4 2.3  --  2.3  --  2.0  PHOS 2.8 2.2*  --  2.2*  --  3.6    GFR: Estimated Creatinine Clearance: 57 mL/min (Daielle Melcher) (by C-G formula based on SCr of 1.04 mg/dL (H)).  Liver Function Tests: Recent Labs  Lab 05/06/24 0258 05/07/24 1016 05/08/24 0547 05/09/24 1248 05/11/24 0531  AST 139* 98* 69* 72* 67*  ALT 32 28 25 25  34  ALKPHOS 42 50 60 109 184*  BILITOT 0.5 0.5 0.5 1.0 0.6  PROT 5.8* 5.5* 5.4* 6.3* 5.6*  ALBUMIN  2.9* 2.4* 2.4* 2.2* 2.4*    CBG: No results for input(s): GLUCAP in the last 168 hours.   Recent Results (from the past 240 hours)  Resp panel by RT-PCR (RSV, Flu Stone Spirito&B, Covid) Anterior Nasal Swab     Status: Abnormal   Collection Time: 05/05/24  7:40 AM   Specimen: Anterior Nasal Swab  Result Value Ref Range  Status   SARS Coronavirus 2 by RT PCR POSITIVE ([place]) NEGATIVE Final   Influenza Karlin Binion by PCR NEGATIVE NEGATIVE Final   Influenza B by PCR NEGATIVE NEGATIVE Final    Comment: (NOTE) The Xpert Xpress SARS-CoV-2/FLU/RSV plus assay is intended as an aid in the diagnosis of influenza from Nasopharyngeal swab specimens and should not be used as Lakya Schrupp sole basis for treatment. Nasal washings and aspirates are unacceptable for Xpert Xpress SARS-CoV-2/FLU/RSV testing.  Fact Sheet for Patients: bloggercourse.com  Fact Sheet for Healthcare Providers: seriousbroker.it  This test is not yet approved or cleared by the [place]  FDA and has been authorized for detection and/or diagnosis of SARS-CoV-2 by FDA under an Emergency Use Authorization (EUA). This EUA will remain in effect (meaning this test can be used) for the  duration of the COVID-19 declaration under Section 564(b)(1) of the Act, 21 U.S.C. section 360bbb-3(b)(1), unless the authorization is terminated or revoked.     Resp Syncytial Virus by PCR NEGATIVE NEGATIVE Final    Comment: (NOTE) Fact Sheet for Patients: bloggercourse.com  Fact Sheet for Healthcare Providers: seriousbroker.it  This test is not yet approved or cleared by the [place]  FDA and has been authorized for detection and/or diagnosis of SARS-CoV-2 by FDA under an Emergency Use Authorization (EUA). This EUA will remain in effect (meaning this test can be used) for the duration of the COVID-19 declaration under Section 564(b)(1) of the Act, 21 U.S.C. section 360bbb-3(b)(1), unless the authorization is terminated or revoked.  Performed at [facility], [address], [place], [place] [address]   Culture, blood (Routine X 2) w Reflex to ID Panel     Status: None   Collection Time: 05/06/24 11:37 AM   Specimen: BLOOD LEFT HAND  Result Value Ref Range Status    Specimen Description BLOOD LEFT HAND  Final   Special Requests   Final    BOTTLES DRAWN AEROBIC ONLY Blood Culture results may not be optimal due to an inadequate volume of blood received in culture bottles   Culture   Final    NO GROWTH 5 DAYS Performed at [facility], [address], [place], [place] [address]    Report Status 05/11/2024 FINAL  Final  Culture, blood (Routine X 2) w Reflex to ID Panel     Status: None   Collection Time: 05/06/24 11:43 AM   Specimen: BLOOD RIGHT ARM  Result Value Ref Range Status   Specimen Description BLOOD RIGHT ARM  Final   Special Requests   Final    BOTTLES DRAWN AEROBIC AND ANAEROBIC Blood Culture adequate volume   Culture   Final    NO GROWTH 5 DAYS Performed at [facility], [address] [place], [place] [address]    Report Status 05/11/2024 FINAL  Final  Aerobic/Anaerobic Culture w Gram Stain (surgical/deep wound)     Status: None (Preliminary result)   Collection Time: 05/09/24  1:59 PM   Specimen: Synovium  Result Value Ref Range Status   Specimen Description SYNOVIAL  Final   Special Requests NONE  Final   Gram Stain   Final    RARE WBC PRESENT,BOTH PMN AND MONONUCLEAR NO ORGANISMS SEEN    Culture   Final    NO GROWTH 2 DAYS NO ANAEROBES ISOLATED; CULTURE IN PROGRESS FOR 5 DAYS Performed at [facility], [address], [place], [place] [address]    Report Status PENDING  Incomplete         Radiology Studies: CT KNEE LEFT WO CONTRAST Result Date: 05/11/2024 EXAM: CT LEFT KNEE WITHOUT IV CONTRAST 05/10/2024 11:08:22 PM TECHNIQUE: Axial images were acquired through the left knee without IV contrast. Reformatted images were reviewed. Automated exposure control, iterative reconstruction, and/or weight based adjustment of the mA/kV was utilized to reduce the radiation dose to as low as reasonably achievable. COMPARISON: None provided. CLINICAL HISTORY: Left knee pain. FINDINGS: BONES: Surgical changes of left  total knee arthroplasty are identified. Normal alignment. No acute fracture or focal osseous lesion. JOINTS: No dislocation. The joint spaces are normal. SOFT TISSUES: Vascular calcifications are noted. Superficial varicosities were seen within the infrapopliteal visualized left lower extremity. IMPRESSION: 1. No acute osseous abnormality. 2. postsurgical changes of left total knee arthroplasty with normal alignment. Electronically signed by:  Dorethia Molt MD MD 05/11/2024 02:49 AM EST RP Workstation: [id number]   CT ABDOMEN PELVIS WO CONTRAST Result Date: 05/11/2024 EXAM: CT ABDOMEN AND PELVIS WITHOUT CONTRAST 05/10/2024 11:08:22 PM TECHNIQUE: CT of the abdomen and pelvis was performed without the administration of intravenous contrast. Multiplanar reformatted images are provided for review. Automated exposure control, iterative reconstruction, and/or weight-based adjustment of the mA/kV was utilized to reduce the radiation dose to as low as reasonably achievable. COMPARISON: None available. CLINICAL HISTORY: pain after fall, bilateral hip pain FINDINGS: LOWER CHEST: No acute abnormality. LIVER: The liver is unremarkable. GALLBLADDER AND BILE DUCTS: Gallbladder is unremarkable. No biliary ductal dilatation. SPLEEN: No acute abnormality. PANCREAS: No acute abnormality. ADRENAL GLANDS: No acute abnormality. KIDNEYS, URETERS AND BLADDER: Simple cortical cyst within the right kidney for which no follow up imaging is recommended. No stones in the kidneys or ureters. No hydronephrosis. No perinephric or periureteral stranding. Urinary bladder is unremarkable. GI AND BOWEL: Stomach demonstrates no acute abnormality. Moderate sigmoid diverticulosis without superimposed acute inflammatory change. The appendix is absent. The small bowel and large bowel are otherwise unremarkable. There is no bowel obstruction. PERITONEUM AND RETROPERITONEUM: No ascites. No free air. VASCULATURE: Aorta is normal in caliber. Mild aortoiliac  atherosclerotic calcification. No aortic aneurysm. LYMPH NODES: No lymphadenopathy. REPRODUCTIVE ORGANS: Uterus is absent. No adnexal masses. BONES AND SOFT TISSUES: Mild right and severe left hip degenerative arthritis. T10-L5 thoracolumbar fusion with instrumentation and L2-L4 posterior decompression. No acute bone abnormality. No lytic or blastic bone lesion. No focal soft tissue abnormality. * raf score: Aortic atherosclerosis (icd10-i70.0), Aortic aneurysm (icd10-i71.9). IMPRESSION: 1. No acute abnormality in the abdomen or pelvis, and no acute osseous abnormality. 2. 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 3. Moderate sigmoid diverticulosis without acute inflammatory change. 4. Postsurgical changes of T10-L5 thoracolumbar fusion with instrumentation and L2-L4 posterior decompression. 5. RAF score includes Aortic atherosclerosis (icd10-i70.0). Electronically signed by: Dorethia Molt MD MD 05/11/2024 02:47 AM EST RP Workstation: [id number]   CT L-SPINE NO CHARGE Result Date: 05/11/2024 EXAM: CT OF THE LUMBAR SPINE WITHOUT CONTRAST 05/10/2024 11:08:22 PM TECHNIQUE: CT of the lumbar spine was performed without the administration of intravenous contrast. Multiplanar reformatted images are provided for review. Automated exposure control, iterative reconstruction, and/or weight based adjustment of the mA/kV was utilized to reduce the radiation dose to as low as reasonably achievable. COMPARISON: CT LUMBAR SPINE WITH CONTRAST 09/10/2005. CLINICAL HISTORY: pain after fall FINDINGS: BONES AND ALIGNMENT: Lumbar fusion with instrumentation has been performed with bilateral pedicle screws and posterior bars at T10-L4 as well as Taryn Shellhammer right unilateral pedicle screw and bar at L5. Anterior discectomy and interbody bone graft placement has been performed at T12-L3 with salt incorporation of interbody bone graft. Bilateral laminectomy and posterior decompression of L2-L4 has been performed as well as right L1  hemilaminectomy. There is bridging callus identified involving the right facet joints of L1-S1 and left facet joints of T10-S1. Bridging osteophytes are seen at at least T8-L2 and L3-S1. There is anterolisthesis L4-L5. No acute fracture of the lumbar spine. Vertebral body height is preserved. No acute traumatic listhesis. DEGENERATIVE CHANGES: Severe right neuroforaminal narrowing noted at T11-T12 and L2-L3. Severe left neuroforaminal narrowing noted at L2-L3. No high-grade canal stenosis. SOFT TISSUES: No paraspinal fluid collection or inflammatory change identified. IMPRESSION: 1. No acute lumbar spine fracture or traumatic listhesis. 2. Postoperative changes of instrumented fusion T10-L4 with right-sided fixation at L5, anterior discectomy and interbody grafting T12-L3, and posterior decompression/laminectomies L2-L4. 3. Severe neuroforaminal  narrowing on the right at T11-12 and L2-3, and on the left at L2-3, without high-grade central canal stenosis. 4. Chronic extensive bridging osseous fusion/osteophytes involving the facet joints and discs from T10-S1 with chronic anterolisthesis at L4-5. Electronically signed by: Dorethia Molt MD MD 05/11/2024 02:35 AM EST RP Workstation: [id number]         Scheduled Meds:  acetaminophen   1,000 mg Oral Q8H   heparin   5,000 Units Subcutaneous Q8H   Continuous Infusions:     LOS: 7 days    Time spent: over 30 min     Meliton Monte, MD [facility]   To contact the attending provider between 7A-7P or the covering provider during after hours 7P-7A, please log into the web site [email or web address] and access using universal [place] password for that web site. If you do not have the password, please call the hospital operator.  05/11/2024, 3:39 PM    "

## 2024-05-12 DIAGNOSIS — M25551 Pain in right hip: Secondary | ICD-10-CM | POA: Diagnosis not present

## 2024-05-12 DIAGNOSIS — T796XXA Traumatic ischemia of muscle, initial encounter: Secondary | ICD-10-CM | POA: Diagnosis not present

## 2024-05-12 DIAGNOSIS — N1831 Chronic kidney disease, stage 3a: Secondary | ICD-10-CM | POA: Diagnosis not present

## 2024-05-12 LAB — BASIC METABOLIC PANEL WITH GFR
Anion gap: 8 (ref 5–15)
BUN: 24 mg/dL — ABNORMAL HIGH (ref 8–23)
CO2: 24 mmol/L (ref 22–32)
Calcium: 8.4 mg/dL — ABNORMAL LOW (ref 8.9–10.3)
Chloride: 105 mmol/L (ref 98–111)
Creatinine, Ser: 0.92 mg/dL (ref 0.44–1.00)
GFR, Estimated: >60 mL/min
Glucose, Bld: 96 mg/dL (ref 70–99)
Potassium: 4.2 mmol/L (ref 3.5–5.1)
Sodium: 138 mmol/L (ref 135–145)

## 2024-05-12 LAB — CBC
HCT: 29 % — ABNORMAL LOW (ref 36.0–46.0)
Hemoglobin: 9.6 g/dL — ABNORMAL LOW (ref 12.0–15.0)
MCH: 27.1 pg (ref 26.0–34.0)
MCHC: 33.1 g/dL (ref 30.0–36.0)
MCV: 81.9 fL (ref 80.0–100.0)
Platelets: 297 K/uL (ref 150–400)
RBC: 3.54 MIL/uL — ABNORMAL LOW (ref 3.87–5.11)
RDW: 14.9 % (ref 11.5–15.5)
WBC: 7 K/uL (ref 4.0–10.5)
nRBC: 0 % (ref 0.0–0.2)

## 2024-05-12 LAB — MAGNESIUM: Magnesium: 2 mg/dL (ref 1.7–2.4)

## 2024-05-12 LAB — PHOSPHORUS: Phosphorus: 2.9 mg/dL (ref 2.5–4.6)

## 2024-05-12 LAB — C-REACTIVE PROTEIN: CRP: 13.4 mg/dL — ABNORMAL HIGH

## 2024-05-12 MED ORDER — DICLOFENAC SODIUM 1 % EX GEL
2.0000 g | Freq: Four times a day (QID) | CUTANEOUS | Status: AC
  Administered 2024-05-12 – 2024-05-14 (×6): 2 g via TOPICAL
  Filled 2024-05-12: qty 100

## 2024-05-12 NOTE — Plan of Care (Signed)
  Problem: Education: Goal: Knowledge of General Education information will improve Description: Including pain rating scale, medication(s)/side effects and non-pharmacologic comfort measures Outcome: Progressing   Problem: Health Behavior/Discharge Planning: Goal: Ability to manage health-related needs will improve Outcome: Progressing   Problem: Clinical Measurements: Goal: Ability to maintain clinical measurements within normal limits will improve Outcome: Progressing Goal: Will remain free from infection Outcome: Progressing Goal: Diagnostic test results will improve Outcome: Progressing Goal: Respiratory complications will improve Outcome: Progressing Goal: Cardiovascular complication will be avoided Outcome: Progressing   Problem: Activity: Goal: Risk for activity intolerance will decrease Outcome: Progressing   Problem: Nutrition: Goal: Adequate nutrition will be maintained Outcome: Progressing   Problem: Coping: Goal: Level of anxiety will decrease Outcome: Progressing   Problem: Elimination: Goal: Will not experience complications related to bowel motility Outcome: Progressing Goal: Will not experience complications related to urinary retention Outcome: Progressing   Problem: Pain Managment: Goal: General experience of comfort will improve and/or be controlled Outcome: Progressing   Problem: Safety: Goal: Ability to remain free from injury will improve Outcome: Progressing   Problem: Skin Integrity: Goal: Risk for impaired skin integrity will decrease Outcome: Progressing   Problem: Education: Goal: Knowledge of risk factors and measures for prevention of condition will improve Outcome: Progressing   Problem: Coping: Goal: Psychosocial and spiritual needs will be supported Outcome: Progressing   Problem: Respiratory: Goal: Will maintain a patent airway Outcome: Progressing Goal: Complications related to the disease process, condition or treatment  will be avoided or minimized Outcome: Progressing

## 2024-05-12 NOTE — TOC Progression Note (Signed)
 Transition of Care [facility]) - Progression Note    Patient Details  Name: [name] MRN: [id number] Date of Birth: 02-09-1946  Transition of Care [facility]) CM/SW Contact  Sherline Clack, [place] Phone Number: 05/12/2024, 9:58 AM  Clinical Narrative:     Patient's shara is pending at this time for [facility]; auth ID: [id number]. CSW will continue to monitor auth and update DC plan.   Expected Discharge Plan: Skilled Nursing Facility Barriers to Discharge: SNF Pending bed offer, Insurance Authorization               Expected Discharge Plan and Services       Living arrangements for the past 2 months: Apartment                                       Social Drivers of Health (SDOH) Interventions SDOH Screenings   Food Insecurity: No Food Insecurity (05/04/2024)  Housing: Low Risk (05/04/2024)  Transportation Needs: Unknown (05/04/2024)  Utilities: Not At Risk (05/04/2024)  Social Connections: Moderately Integrated (05/04/2024)  Tobacco Use: Low Risk (05/04/2024)    Readmission Risk Interventions     No data to display

## 2024-05-12 NOTE — Progress Notes (Signed)
 Physical Therapy Treatment Patient Details Name: NEKAYLA HEIDER MRN: [id number] DOB: December 24, 1945 Today's Date: 05/12/2024   History of Present Illness 79 y.o. female presents to [facility] 05/04/23 with R hip pain after unwitnessed fall. Pt was on the ground for <1 hrs with rhabdomyolysis and AKI. Imaging negative, COVID+. PMHx: dementia, hypertension, and hyperlipidemia    PT Comments  Able to assist pt to transition OOB today.  Still very fearful and reports pain though RN reports had given antianxiety meds previously.  She tolerated sitting up in the lift without complaint.  Hopeful she would attempt to reposition herself once up in sitting position, though no attempt after encouragement and increased time.  Son arrived during session and encouraged pt to participate.  She will need post-acute inpatient rehab (<3 hours/day) prior to d/c home.     If plan is discharge home, recommend the following: Assistance with cooking/housework;Assist for transportation;Help with stairs or ramp for entrance;Direct supervision/assist for financial management;Direct supervision/assist for medications management;Two people to help with walking and/or transfers;Two people to help with bathing/dressing/bathroom;Supervision due to cognitive status   Can travel by private vehicle     No  Equipment Recommendations  BSC/3in1;Wheelchair (measurements PT);Wheelchair cushion (measurements PT);Hoyer lift    Recommendations for [employer]       Precautions / Restrictions Precautions Precautions: Fall Recall of Precautions/Restrictions: Impaired Precaution/Restrictions Comments: Covid+ airborne Dispensing Optician Overal bed mobility: Needs Assistance Bed Mobility: Rolling Rolling: Total assist, +2 for physical assistance, +2 for safety/equipment         General bed mobility comments: rolling with A for lift pad placement    Transfers Overall transfer level: Needs assistance   Transfers:  Bed to chair/wheelchair/BSC             General transfer comment: up to recliner via lift, pt not yelling while in lift and cooperative once in lift; more reports of pain and agitated when repositioning in chair; threw a cup of juice at her son prior to his departure Transfer via Lift Equipment: Maximove  Ambulation/Gait                   Stairs             Wheelchair Mobility     Tilt Bed    Modified Rankin (Stroke Patients Only)       Balance Overall balance assessment: Needs assistance   Sitting balance-Leahy Scale: Poor Sitting balance - Comments: A for leaning forward to place pillow behind her, encouraged first to pull up on amrests in chair to reposition herself, but would not follow through and gets distracted prior to attempts                                    Communication Communication Communication: No apparent difficulties  Cognition Arousal: Alert Behavior During Therapy: Agitated, Anxious   PT - Cognitive impairments: History of cognitive impairments, Problem solving, Safety/Judgement, No apparent impairments, Memory                       PT - Cognition Comments: movement rolling in bed pt yelling and though asking for time to move does not move, encouragement given and son arrived during session Following commands: Impaired Following commands impaired: Follows one step commands inconsistently, Follows one step commands with increased time    Cueing Cueing Techniques: Verbal  cues, Tactile cues  Exercises      General Comments General comments (skin integrity, edema, etc.): RN aware pt in chair and for lift back to bed @ 4:45pm      Pertinent Vitals/Pain Pain Assessment Pain Assessment: Faces Faces Pain Scale: Hurts whole lot Pain Location: R knee and hips rolling in bed Pain Descriptors / Indicators: Grimacing, Moaning, Aching, Tender Pain Intervention(s): Monitored during session, Repositioned, Limited  activity within patient's tolerance    Home Living                          Prior Function            PT Goals (current goals can now be found in the care plan section) Progress towards PT goals: Progressing toward goals    Frequency    Min 2X/week      PT Plan      Co-evaluation              AM-PAC PT 6 Clicks Mobility   Outcome Measure  Help needed turning from your back to your side while in a flat bed without using bedrails?: Total Help needed moving from lying on your back to sitting on the side of a flat bed without using bedrails?: Total Help needed moving to and from a bed to a chair (including a wheelchair)?: Total Help needed standing up from a chair using your arms (e.g., wheelchair or bedside chair)?: Total Help needed to walk in hospital room?: Total Help needed climbing 3-5 steps with a railing? : Total 6 Click Score: 6    End of Session   Activity Tolerance: Patient limited by pain Patient left: in chair;with call bell/phone within reach;with chair alarm set Nurse Communication: Mobility status;Need for lift equipment;Precautions PT Visit Diagnosis: Other abnormalities of gait and mobility (R26.89);Muscle weakness (generalized) (M62.81);History of falling (Z91.81);Other symptoms and signs involving the nervous system (R29.898)     Time: 8470-8444 PT Time Calculation (min) (ACUTE ONLY): 26 min  Charges:    $Therapeutic Activity: 23-37 mins PT General Charges $$ ACUTE PT VISIT: 1 Visit                     Micheline Portal, PT Acute Rehabilitation Services Office:[phone] 05/12/2024    Montie Portal 05/12/2024, 5:07 PM

## 2024-05-12 NOTE — Progress Notes (Signed)
 [facility]  PROGRESS NOTE  [name] FMW:[id number] DOB: 01-11-1946 DOA: 05/03/2024 PCP: [facility]   Brief HPI:   79 y.o. female with medical history significant for dementia, hypertension, and hyperlipidemia who presents with right hip pain after an unwitnessed fall at home.    Developed fever and was diagnosed with COVID 19 infection.   Hospitalization has been complicated by significant lower extremity pain limited her mobility in bed and ability to participate with therapy.     Assessment/Plan:   COVID 19 Infection Fever Fever suspected in setting of COVID 19 infection Could have contributed to fall with generalized weakness O2 sat generally above 95%, will hold off on any antivirals at this time.  No indication for steroids. Blood cultures Ngx5. Significantly elevated inflammatory markers - related to covid?  Seems out of proportion. Normal white count, lower suspicion for bacterial illness   Rhabdomyolysis UA with hemoglobin, 0-5 RBC - suggestive of myoglobinuria Continue IVF, will trend CK (improving)   Right Hip Pain Right Ankle Pain Lower Extremity Pain At home using walker at times and cane at times - living with son exam has been difficult.  Pain subjectively seems to be migratory.  She's not participating well with therapy due to pain.  Extensive imaging has been done.  Arthrocentesis of R knee, not c/w septic arthritis, culture NGTD.  question whether pain is related to soft tissue/ligamentous injury. CT pelvis with severe bone on bone L hip arthrosis with juxtaarticular cystic changes  CT R knee with well seated hardware - small to moderate knee joint effusion Plain films of R knee and ankle with large plantar calcaneal spur with adjacent bone fragment, priminent plantar calcaneal spur with adjacent fragmentation Plain film R femur with enthesophyte at superior pole of patella with fragmentation - possible avulsion injury at superior pole of  patella CT right ankle without notable acute findings (see report) Plain films L knee with intact hardware without periprosthetic fx or lucency CT L spine without acute lumbar spine fx or traumatic listhesis - post op changes noted.  Severe neuroforaminal narrowing.  Bridging osseous fusion/osteophytes. CT abd pelvis without acute abnormality in the abdomen/pelvis.   -Will start topical Voltaren  gel   Elevated Inflammatory Markers Unclear cause - seems out of proportion to covid Downtrending to 13.7 today Follow procalcitonin - 0.3 RF (mildly positive), anti CCP and ana are negative. Will continue to trend    Elevated Troponin Not c/w ACS EKG reassuring Possibly related to rhabdo   AKI on CKD IIIa resolved   Dementia Delirium precautions This has made our exam/evaluation difficult   Delevated AST In setting of rhabdo  - mild   Obesity, Class III Body mass index is 47.12 kg/m.  Working on SNF      DVT prophylaxis:   Medications     acetaminophen   1,000 mg Oral Q8H   heparin   5,000 Units Subcutaneous Q8H     Data Reviewed:   CBG:  No results for input(s): GLUCAP in the last 168 hours.  SpO2: 96 %    Vitals:   05/11/24 1923 05/12/24 0002 05/12/24 0432 05/12/24 0728  BP: (!) 130/57 116/65 114/62 120/71  Pulse: 73 94 69 71  Resp: 17 17 17 18   Temp: 98.9 F (37.2 C) 99.1 F (37.3 C) 98.7 F (37.1 C) 97.9 F (36.6 C)  TempSrc: Oral Oral Oral   SpO2: 99% 95% 96% 96%  Weight:      Height:  Data Reviewed:  Basic Metabolic Panel: Recent Labs  Lab 05/06/24 0258 05/07/24 1016 05/08/24 0547 05/09/24 1248 05/11/24 0531 05/12/24 0231  NA 138 138 133* 134* 136 138  K 4.0 3.8 4.0 4.5 4.4 4.2  CL 107 109 104 106 105 105  CO2 23 19* 22 17* 23 24  GLUCOSE 95 117* 101* 102* 113* 96  BUN 32* 26* 24* 18 21 24*  CREATININE 1.33* 0.99 0.91 0.90 1.04* 0.92  CALCIUM 8.1* 8.1* 8.0* 8.8* 8.6* 8.4*  MG 2.3  --  2.3  --  2.0 2.0  PHOS 2.2*  --   2.2*  --  3.6 2.9    CBC: Recent Labs  Lab 05/06/24 0258 05/07/24 1016 05/08/24 0547 05/11/24 0531 05/12/24 0231  WBC 8.2 7.7 7.4 8.5 7.0  NEUTROABS 5.6 6.0 4.8 6.0  --   HGB 11.0* 10.4* 9.7* 9.8* 9.6*  HCT 34.2* 31.8* 29.0* 29.6* 29.0*  MCV 84.7 82.0 80.3 81.3 81.9  PLT 128* 143* 176 291 297    LFT Recent Labs  Lab 05/06/24 0258 05/07/24 1016 05/08/24 0547 05/09/24 1248 05/11/24 0531  AST 139* 98* 69* 72* 67*  ALT 32 28 25 25  34  ALKPHOS 42 50 60 109 184*  BILITOT 0.5 0.5 0.5 1.0 0.6  PROT 5.8* 5.5* 5.4* 6.3* 5.6*  ALBUMIN  2.9* 2.4* 2.4* 2.2* 2.4*     Antibiotics: Anti-infectives (From admission, onward)    None        CONSULTS   Code Status:   Family Communication:      Subjective   Patient seen and examined, complains of pain in the right thigh.   Objective    Physical Examination:   General-appears in no acute distress Heart-S1-S2, regular, no murmur auscultated Lungs-clear to auscultation bilaterally, no wheezing or crackles auscultated Abdomen-soft, nontender, no organomegaly Extremities-tenderness in right thigh Neuro-alert, oriented x3, no focal deficit noted            Felipa Laroche S Jodell Weitman   [facility] If 7PM-7AM, please contact night-coverage at [email or web address], Office  [phone]   05/12/2024, 11:06 AM  LOS: 8 days

## 2024-05-12 NOTE — Plan of Care (Signed)

## 2024-05-13 DIAGNOSIS — N1831 Chronic kidney disease, stage 3a: Secondary | ICD-10-CM | POA: Diagnosis not present

## 2024-05-13 DIAGNOSIS — M25551 Pain in right hip: Secondary | ICD-10-CM | POA: Diagnosis not present

## 2024-05-13 DIAGNOSIS — T796XXA Traumatic ischemia of muscle, initial encounter: Secondary | ICD-10-CM | POA: Diagnosis not present

## 2024-05-13 LAB — CK: Total CK: 799 U/L — ABNORMAL HIGH (ref 38–234)

## 2024-05-13 NOTE — TOC Progression Note (Signed)
 Transition of Care [facility]) - Progression Note    Patient Details  Name: [name] MRN: [id number] Date of Birth: 1945-10-14  Transition of [facility]) CM/SW Contact  Sherline Clack, [place] Phone Number: 05/13/2024, 4:50 PM  Clinical Narrative:     Patient's shara was approved for [facility], auth ID: [id number], 1/15-1/20. [facility] anticipates a bed opening on Saturday and will call CSW if a bed opens up sooner. CSW will continue to follow.   Expected Discharge Plan: Skilled Nursing Facility Barriers to Discharge: SNF Pending bed offer, Insurance Authorization               Expected Discharge Plan and Services       Living arrangements for the past 2 months: Apartment                                       Social Drivers of Health (SDOH) Interventions SDOH Screenings   Food Insecurity: No Food Insecurity (05/04/2024)  Housing: Low Risk (05/04/2024)  Transportation Needs: Unknown (05/04/2024)  Utilities: Not At Risk (05/04/2024)  Social Connections: Moderately Integrated (05/04/2024)  Tobacco Use: Low Risk (05/04/2024)    Readmission Risk Interventions     No data to display

## 2024-05-13 NOTE — TOC Progression Note (Signed)
 Transition of Care [facility]) - Progression Note    Patient Details  Name: [name] MRN: [id number] Date of Birth: 1945-06-07  Transition of Care [facility]) CM/SW Contact  Sherline Clack, [place] Phone Number: 05/13/2024, 11:11 AM  Clinical Narrative:     Patient's insurance is requesting a peer to peer due today at 3:30 pm for patient. The number to call is [phone], opt 5. Provider made aware.   Expected Discharge Plan: Skilled Nursing Facility Barriers to Discharge: SNF Pending bed offer, Insurance Authorization               Expected Discharge Plan and Services       Living arrangements for the past 2 months: Apartment                                       Social Drivers of Health (SDOH) Interventions SDOH Screenings   Food Insecurity: No Food Insecurity (05/04/2024)  Housing: Low Risk (05/04/2024)  Transportation Needs: Unknown (05/04/2024)  Utilities: Not At Risk (05/04/2024)  Social Connections: Moderately Integrated (05/04/2024)  Tobacco Use: Low Risk (05/04/2024)    Readmission Risk Interventions     No data to display

## 2024-05-13 NOTE — Progress Notes (Signed)
 [facility]  PROGRESS NOTE  [name] FMW:[id number] DOB: [birth year] DOA: 05/03/2024 PCP: [facility]   Brief HPI:   79 y.o. female with medical history significant for dementia, hypertension, and hyperlipidemia who presents with right hip pain after an unwitnessed fall at home.    Developed fever and was diagnosed with COVID 19 infection.   Hospitalization has been complicated by significant lower extremity pain limited her mobility in bed and ability to participate with therapy.     Assessment/Plan:   COVID 19 Infection Fever Fever suspected in setting of COVID 19 infection Could have contributed to fall with generalized weakness O2 sat generally above 95%, will hold off on any antivirals at this time.  No indication for steroids. Blood cultures Ngx5. Significantly elevated inflammatory markers - related to covid?  Seems out of proportion. Normal white count, lower suspicion for bacterial illness   Rhabdomyolysis UA with hemoglobin, 0-5 RBC - suggestive of myoglobinuria Continue IVF, will trend CK (improving)   Right Hip Pain Right Ankle Pain Lower Extremity Pain At home using walker at times and cane at times - living with son exam has been difficult.  Pain subjectively seems to be migratory.  She's not participating well with therapy due to pain.  Extensive imaging has been done.  Arthrocentesis of R knee, not c/w septic arthritis, culture NGTD.  question whether pain is related to soft tissue/ligamentous injury. CT pelvis with severe bone on bone L hip arthrosis with juxtaarticular cystic changes  CT R knee with well seated hardware - small to moderate knee joint effusion Plain films of R knee and ankle with large plantar calcaneal spur with adjacent bone fragment, priminent plantar calcaneal spur with adjacent fragmentation Plain film R femur with enthesophyte at superior pole of patella with fragmentation - possible avulsion injury at superior pole of  patella CT right ankle without notable acute findings (see report) Plain films L knee with intact hardware without periprosthetic fx or lucency CT L spine without acute lumbar spine fx or traumatic listhesis - post op changes noted.  Severe neuroforaminal narrowing.  Bridging osseous fusion/osteophytes. CT abd pelvis without acute abnormality in the abdomen/pelvis.   - Right side thigh pain has improved with topical  Voltaren  gel   Elevated Inflammatory Markers Unclear cause - seems out of proportion to covid Downtrending to 13.7 today Follow procalcitonin - 0.3 RF (mildly positive), anti CCP and ana are negative. Will continue to trend    Elevated Troponin Not c/w ACS EKG reassuring Possibly related to rhabdo   AKI on CKD IIIa resolved   Dementia Delirium precautions This has made our exam/evaluation difficult   Delevated AST In setting of rhabdo  - mild   Obesity, Class III Body mass index is 47.12 kg/m.  Working on SNF      DVT prophylaxis:   Medications     acetaminophen   1,000 mg Oral Q8H   diclofenac  Sodium  2 g Topical QID   heparin   5,000 Units Subcutaneous Q8H     Data Reviewed:   CBG:  No results for input(s): GLUCAP in the last 168 hours.  SpO2: 95 %    Vitals:   05/12/24 1936 05/13/24 0038 05/13/24 0424 05/13/24 0750  BP: (!) 149/63 (!) 142/95 (!) 152/108 (!) 157/95  Pulse: 85 93 100 84  Resp: 18 18  19   Temp: 98.2 F (36.8 C) 98.9 F (37.2 C) 98.5 F (36.9 C) 98.9 F (37.2 C)  TempSrc:    Oral  SpO2: 100% 98% 97% 95%  Weight:      Height:          Data Reviewed:  Basic Metabolic Panel: Recent Labs  Lab 05/07/24 1016 05/08/24 0547 05/09/24 1248 05/11/24 0531 05/12/24 0231  NA 138 133* 134* 136 138  K 3.8 4.0 4.5 4.4 4.2  CL 109 104 106 105 105  CO2 19* 22 17* 23 24  GLUCOSE 117* 101* 102* 113* 96  BUN 26* 24* 18 21 24*  CREATININE 0.99 0.91 0.90 1.04* 0.92  CALCIUM 8.1* 8.0* 8.8* 8.6* 8.4*  MG  --  2.3  --   2.0 2.0  PHOS  --  2.2*  --  3.6 2.9    CBC: Recent Labs  Lab 05/07/24 1016 05/08/24 0547 05/11/24 0531 05/12/24 0231  WBC 7.7 7.4 8.5 7.0  NEUTROABS 6.0 4.8 6.0  --   HGB 10.4* 9.7* 9.8* 9.6*  HCT 31.8* 29.0* 29.6* 29.0*  MCV 82.0 80.3 81.3 81.9  PLT 143* 176 291 297    LFT Recent Labs  Lab 05/07/24 1016 05/08/24 0547 05/09/24 1248 05/11/24 0531  AST 98* 69* 72* 67*  ALT 28 25 25  34  ALKPHOS 50 60 109 184*  BILITOT 0.5 0.5 1.0 0.6  PROT 5.5* 5.4* 6.3* 5.6*  ALBUMIN  2.4* 2.4* 2.2* 2.4*     Antibiotics: Anti-infectives (From admission, onward)    None        CONSULTS   Code Status:   Family Communication:      Subjective    Patient seen and examined, denies any complaints.  Right thigh pain has improved  Objective    Physical Examination:   General-appears in no acute distress Heart-S1-S2, regular, no murmur auscultated Lungs-clear to auscultation bilaterally, no wheezing or crackles auscultated Abdomen-soft, nontender, no organomegaly Extremities-no edema in the lower extremities Neuro-alert, oriented x3, no focal deficit noted            Marwin Primmer S Rasmus Preusser   [facility] If 7PM-7AM, please contact night-coverage at [email or web address], Office  [phone]   05/13/2024, 9:04 AM  LOS: 9 days

## 2024-05-14 DIAGNOSIS — T796XXA Traumatic ischemia of muscle, initial encounter: Secondary | ICD-10-CM | POA: Diagnosis not present

## 2024-05-14 DIAGNOSIS — N1831 Chronic kidney disease, stage 3a: Secondary | ICD-10-CM | POA: Diagnosis not present

## 2024-05-14 DIAGNOSIS — M25551 Pain in right hip: Secondary | ICD-10-CM | POA: Diagnosis not present

## 2024-05-14 LAB — AEROBIC/ANAEROBIC CULTURE W GRAM STAIN (SURGICAL/DEEP WOUND): Culture: NO GROWTH

## 2024-05-14 LAB — C-REACTIVE PROTEIN: CRP: 10 mg/dL — ABNORMAL HIGH

## 2024-05-14 NOTE — Plan of Care (Signed)
   Problem: Education: Goal: Knowledge of General Education information will improve Description: Including pain rating scale, medication(s)/side effects and non-pharmacologic comfort measures Outcome: Not Progressing   Problem: Health Behavior/Discharge Planning: Goal: Ability to manage health-related needs will improve Outcome: Not Progressing   Problem: Activity: Goal: Risk for activity intolerance will decrease Outcome: Not Progressing

## 2024-05-14 NOTE — Progress Notes (Signed)
 [facility]  PROGRESS NOTE  KINDALL SWABY FMW:[id number] DOB: [birth year] DOA: 05/03/2024 PCP: [facility]   Brief HPI:   79 y.o. female with medical history significant for dementia, hypertension, and hyperlipidemia who presents with right hip pain after an unwitnessed fall at home.    Developed fever and was diagnosed with COVID 19 infection.   Hospitalization has been complicated by significant lower extremity pain limited her mobility in bed and ability to participate with therapy.     Assessment/Plan:   COVID 19 Infection Fever Fever suspected in setting of COVID 19 infection Could have contributed to fall with generalized weakness O2 sat generally above 95%, will hold off on any antivirals at this time.  No indication for steroids. Blood cultures Ngx5. Significantly elevated inflammatory markers - related to covid?  Seems out of proportion. Normal white count, lower suspicion for bacterial illness   Rhabdomyolysis UA with hemoglobin, 0-5 RBC - suggestive of myoglobinuria Continue IVF, will trend CK (improving)   Right Hip Pain Right Ankle Pain Lower Extremity Pain At home using walker at times and cane at times - living with son exam has been difficult.  Pain subjectively seems to be migratory.  She's not participating well with therapy due to pain.  Extensive imaging has been done.  Arthrocentesis of R knee, not c/w septic arthritis, culture NGTD.  question whether pain is related to soft tissue/ligamentous injury. CT pelvis with severe bone on bone L hip arthrosis with juxtaarticular cystic changes  CT R knee with well seated hardware - small to moderate knee joint effusion Plain films of R knee and ankle with large plantar calcaneal spur with adjacent bone fragment, priminent plantar calcaneal spur with adjacent fragmentation Plain film R femur with enthesophyte at superior pole of patella with fragmentation - possible avulsion injury at superior pole of  patella CT right ankle without notable acute findings (see report) Plain films L knee with intact hardware without periprosthetic fx or lucency CT L spine without acute lumbar spine fx or traumatic listhesis - post op changes noted.  Severe neuroforaminal narrowing.  Bridging osseous fusion/osteophytes. CT abd pelvis without acute abnormality in the abdomen/pelvis.   - Right side thigh pain has improved with topical  Voltaren  gel   Elevated Inflammatory Markers Unclear cause - seems out of proportion to covid Downtrending CRP down to 10.0 today Follow procalcitonin - 0.3 RF (mildly positive), anti CCP and ana are negative. Will continue to trend    Elevated Troponin Not c/w ACS EKG reassuring Possibly related to rhabdo   AKI on CKD IIIa resolved   Dementia Delirium precautions This has made our exam/evaluation difficult   Delevated AST In setting of rhabdo  - mild   Obesity, Class III Body mass index is 47.12 kg/m.  Working on SNF      DVT prophylaxis:   Medications     diclofenac  Sodium  2 g Topical QID   heparin   5,000 Units Subcutaneous Q8H     Data Reviewed:   CBG:  No results for input(s): GLUCAP in the last 168 hours.  SpO2: 96 %    Vitals:   05/13/24 1254 05/13/24 1720 05/13/24 2305 05/14/24 0411  BP: (!) 144/90 (!) 132/117 129/76 136/78  Pulse: 91 71 89 99  Resp:  19    Temp:  98.4 F (36.9 C) 98.8 F (37.1 C) 99.1 F (37.3 C)  TempSrc:  Oral    SpO2: 98% 97% 98% 96%  Weight:  Height:          Data Reviewed:  Basic Metabolic Panel: Recent Labs  Lab 05/07/24 1016 05/08/24 0547 05/09/24 1248 05/11/24 0531 05/12/24 0231  NA 138 133* 134* 136 138  K 3.8 4.0 4.5 4.4 4.2  CL 109 104 106 105 105  CO2 19* 22 17* 23 24  GLUCOSE 117* 101* 102* 113* 96  BUN 26* 24* 18 21 24*  CREATININE 0.99 0.91 0.90 1.04* 0.92  CALCIUM 8.1* 8.0* 8.8* 8.6* 8.4*  MG  --  2.3  --  2.0 2.0  PHOS  --  2.2*  --  3.6 2.9    CBC: Recent  Labs  Lab 05/07/24 1016 05/08/24 0547 05/11/24 0531 05/12/24 0231  WBC 7.7 7.4 8.5 7.0  NEUTROABS 6.0 4.8 6.0  --   HGB 10.4* 9.7* 9.8* 9.6*  HCT 31.8* 29.0* 29.6* 29.0*  MCV 82.0 80.3 81.3 81.9  PLT 143* 176 291 297    LFT Recent Labs  Lab 05/07/24 1016 05/08/24 0547 05/09/24 1248 05/11/24 0531  AST 98* 69* 72* 67*  ALT 28 25 25  34  ALKPHOS 50 60 109 184*  BILITOT 0.5 0.5 1.0 0.6  PROT 5.5* 5.4* 6.3* 5.6*  ALBUMIN  2.4* 2.4* 2.2* 2.4*     Antibiotics: Anti-infectives (From admission, onward)    None        CONSULTS   Code Status:   Family Communication:      Subjective   Patient seen and examined, denies any complaints.  Leg pain has improved   Objective    Physical Examination:  General-appears in no acute distress Heart-S1-S2, regular, no murmur auscultated Lungs-clear to auscultation bilaterally, no wheezing or crackles auscultated Abdomen-soft, nontender, no organomegaly Extremities-no edema in the lower extremities Neuro-alert, oriented x3, no focal deficit noted            Arieon Corcoran S Amoy Steeves   [facility] If 7PM-7AM, please contact night-coverage at [email or web address], Office  [phone]   05/14/2024, 9:15 AM  LOS: 10 days

## 2024-05-15 DIAGNOSIS — W19XXXA Unspecified fall, initial encounter: Secondary | ICD-10-CM | POA: Diagnosis not present

## 2024-05-15 DIAGNOSIS — F0390 Unspecified dementia without behavioral disturbance: Secondary | ICD-10-CM | POA: Diagnosis not present

## 2024-05-15 DIAGNOSIS — T796XXA Traumatic ischemia of muscle, initial encounter: Secondary | ICD-10-CM | POA: Diagnosis not present

## 2024-05-15 DIAGNOSIS — N1831 Chronic kidney disease, stage 3a: Secondary | ICD-10-CM | POA: Diagnosis not present

## 2024-05-15 DIAGNOSIS — Y92009 Unspecified place in unspecified non-institutional (private) residence as the place of occurrence of the external cause: Secondary | ICD-10-CM | POA: Diagnosis not present

## 2024-05-15 DIAGNOSIS — M25551 Pain in right hip: Secondary | ICD-10-CM | POA: Diagnosis not present

## 2024-05-15 MED ORDER — OXYCODONE HCL 5 MG PO TABS: 5.0000 mg | ORAL_TABLET | Freq: Four times a day (QID) | ORAL | 0 refills | Status: AC | PRN

## 2024-05-15 MED ORDER — DICLOFENAC SODIUM 1 % EX GEL: 2.0000 g | Freq: Three times a day (TID) | CUTANEOUS | Status: DC | PRN

## 2024-05-15 MED ORDER — SENNA 8.6 MG PO TABS: 1.0000 | ORAL_TABLET | Freq: Every day | ORAL | Status: AC | PRN

## 2024-05-15 MED ORDER — ACETAMINOPHEN 325 MG PO TABS: 650.0000 mg | ORAL_TABLET | Freq: Four times a day (QID) | ORAL | Status: AC | PRN

## 2024-05-15 MED ORDER — MELATONIN 3 MG PO TABS: 3.0000 mg | ORAL_TABLET | Freq: Every evening | ORAL | Status: AC | PRN

## 2024-05-15 MED ORDER — LORAZEPAM 0.5 MG PO TABS: 0.5000 mg | ORAL_TABLET | Freq: Two times a day (BID) | ORAL | 0 refills | Status: AC | PRN

## 2024-05-15 MED ORDER — DICLOFENAC SODIUM 1 % EX GEL: 2.0000 g | Freq: Three times a day (TID) | CUTANEOUS | Status: AC | PRN

## 2024-05-15 NOTE — Progress Notes (Addendum)
 [facility]  PROGRESS NOTE  [name] FMW:[id number] DOB: 07-01-1945 DOA: 05/03/2024 PCP: [facility]   Brief HPI:   79 y.o. female with medical history significant for dementia, hypertension, and hyperlipidemia who presents with right hip pain after an unwitnessed fall at home.    Developed fever and was diagnosed with COVID 19 infection.   Hospitalization has been complicated by significant lower extremity pain limited her mobility in bed and ability to participate with therapy.     Assessment/Plan:   COVID 19 Infection Fever Fever suspected in setting of COVID 19 infection Could have contributed to fall with generalized weakness O2 sat generally above 95%, will hold off on any antivirals at this time.  No indication for steroids. Blood cultures Ngx5. Significantly elevated inflammatory markers - related to covid?  Seems out of proportion. Normal white count, lower suspicion for bacterial illness   Rhabdomyolysis UA with hemoglobin, 0-5 RBC - suggestive of myoglobinuria Continue IVF, will trend CK (improving) - CK is down to 799  Swelling of right hand -Has tight name tag -Will remove name tag   Right Hip Pain Right Ankle Pain Lower Extremity Pain At home using walker at times and cane at times - living with son exam has been difficult.  Pain subjectively seems to be migratory.  She's not participating well with therapy due to pain.  Extensive imaging has been done.  Arthrocentesis of R knee, not c/w septic arthritis, culture NGTD.  question whether pain is related to soft tissue/ligamentous injury. CT pelvis with severe bone on bone L hip arthrosis with juxtaarticular cystic changes  CT R knee with well seated hardware - small to moderate knee joint effusion Plain films of R knee and ankle with large plantar calcaneal spur with adjacent bone fragment, priminent plantar calcaneal spur with adjacent fragmentation Plain film R femur with enthesophyte at  superior pole of patella with fragmentation - possible avulsion injury at superior pole of patella CT right ankle without notable acute findings (see report) Plain films L knee with intact hardware without periprosthetic fx or lucency CT L spine without acute lumbar spine fx or traumatic listhesis - post op changes noted.  Severe neuroforaminal narrowing.  Bridging osseous fusion/osteophytes. CT abd pelvis without acute abnormality in the abdomen/pelvis.   - Right side thigh pain has improved with topical  Voltaren  gel   Elevated Inflammatory Markers Unclear cause - seems out of proportion to covid Downtrending CRP down to 10.0 today Follow procalcitonin - 0.3 RF (mildly positive), anti CCP and ana are negative. Will continue to trend    Elevated Troponin Not c/w ACS EKG reassuring Possibly related to rhabdo   AKI on CKD IIIa resolved   Dementia Delirium precautions This has made our exam/evaluation difficult   Delevated AST In setting of rhabdo  - mild   Obesity, Class III Body mass index is 47.12 kg/m.  Working on SNF      DVT prophylaxis:   Medications     heparin   5,000 Units Subcutaneous Q8H     Data Reviewed:   CBG:  No results for input(s): GLUCAP in the last 168 hours.  SpO2: 95 %    Vitals:   05/13/24 2305 05/14/24 0411 05/14/24 1048 05/15/24 0502  BP: 129/76 136/78 116/78 (!) 111/55  Pulse: 89 99 91 85  Resp:      Temp: 98.8 F (37.1 C) 99.1 F (37.3 C)  99.1 F (37.3 C)  TempSrc:      SpO2:  98% 96% 96% 95%  Weight:      Height:          Data Reviewed:  Basic Metabolic Panel: Recent Labs  Lab 05/09/24 1248 05/11/24 0531 05/12/24 0231  NA 134* 136 138  K 4.5 4.4 4.2  CL 106 105 105  CO2 17* 23 24  GLUCOSE 102* 113* 96  BUN 18 21 24*  CREATININE 0.90 1.04* 0.92  CALCIUM 8.8* 8.6* 8.4*  MG  --  2.0 2.0  PHOS  --  3.6 2.9    CBC: Recent Labs  Lab 05/11/24 0531 05/12/24 0231  WBC 8.5 7.0  NEUTROABS 6.0  --    HGB 9.8* 9.6*  HCT 29.6* 29.0*  MCV 81.3 81.9  PLT 291 297    LFT Recent Labs  Lab 05/09/24 1248 05/11/24 0531  AST 72* 67*  ALT 25 34  ALKPHOS 109 184*  BILITOT 1.0 0.6  PROT 6.3* 5.6*  ALBUMIN  2.2* 2.4*     Antibiotics: Anti-infectives (From admission, onward)    None        CONSULTS   Code Status: Full code  Family Communication: Discussed with patient's son at bedside     Subjective   Patient seen and examined, complains of swelling of right hand.   Objective    Physical Examination:  General-appears in no acute distress Heart-S1-S2, regular, no murmur auscultated Lungs-clear to auscultation bilaterally, no wheezing or crackles auscultated Abdomen-soft, nontender, no organomegaly Extremities-swelling of right hand with tight name tag band Neuro-alert, oriented x3, no focal deficit noted           [name] S Kenner Lewan   [facility] If 7PM-7AM, please contact night-coverage at [email or web address], Office  [phone]   05/15/2024, 8:35 AM  LOS: 11 days

## 2024-05-15 NOTE — Plan of Care (Signed)
" °  Problem: Education: Goal: Knowledge of General Education information will improve Description: Including pain rating scale, medication(s)/side effects and non-pharmacologic comfort measures Outcome: Adequate for Discharge   Problem: Health Behavior/Discharge Planning: Goal: Ability to manage health-related needs will improve Outcome: Adequate for Discharge   Problem: Clinical Measurements: Goal: Ability to maintain clinical measurements within normal limits will improve Outcome: Adequate for Discharge Goal: Will remain free from infection Outcome: Adequate for Discharge Goal: Diagnostic test results will improve Outcome: Adequate for Discharge Goal: Respiratory complications will improve Outcome: Adequate for Discharge Goal: Cardiovascular complication will be avoided Outcome: Adequate for Discharge   Problem: Activity: Goal: Risk for activity intolerance will decrease Outcome: Adequate for Discharge   Problem: Nutrition: Goal: Adequate nutrition will be maintained Outcome: Adequate for Discharge   Problem: Coping: Goal: Level of anxiety will decrease Outcome: Adequate for Discharge   Problem: Elimination: Goal: Will not experience complications related to bowel motility Outcome: Adequate for Discharge Goal: Will not experience complications related to urinary retention Outcome: Adequate for Discharge   Problem: Pain Managment: Goal: General experience of comfort will improve and/or be controlled Outcome: Adequate for Discharge   Problem: Safety: Goal: Ability to remain free from injury will improve Outcome: Adequate for Discharge   Problem: Skin Integrity: Goal: Risk for impaired skin integrity will decrease Outcome: Adequate for Discharge   Problem: Education: Goal: Knowledge of risk factors and measures for prevention of condition will improve Outcome: Adequate for Discharge   Problem: Coping: Goal: Psychosocial and spiritual needs will be  supported Outcome: Adequate for Discharge   Problem: Respiratory: Goal: Will maintain a patent airway Outcome: Adequate for Discharge Goal: Complications related to the disease process, condition or treatment will be avoided or minimized Outcome: Adequate for Discharge   Problem: Acute Rehab PT Goals(only PT should resolve) Goal: Pt Will Go Supine/Side To Sit Outcome: Adequate for Discharge Goal: Patient Will Transfer Sit To/From Stand Outcome: Adequate for Discharge Goal: Pt Will Transfer Bed To Chair/Chair To Bed Outcome: Adequate for Discharge Goal: Pt Will Perform Standing Balance Or Pre-Gait Outcome: Adequate for Discharge Goal: Pt Will Ambulate Outcome: Adequate for Discharge   Problem: Acute Rehab OT Goals (only OT should resolve) Goal: Pt. Will Perform Eating Outcome: Adequate for Discharge Goal: Pt. Will Perform Grooming Outcome: Adequate for Discharge Goal: Pt. Will Perform Upper Body Dressing Outcome: Adequate for Discharge Goal: Pt. Will Transfer To Toilet Outcome: Adequate for Discharge   "

## 2024-05-15 NOTE — Discharge Summary (Signed)
 " Physician Discharge Summary   Patient: [name] MRN: [id number] DOB: 08-Nov-1945  Admit date:     05/03/2024  Discharge date: 05/15/24  Discharge Physician: Sabas GORMAN Brod   PCP: [facility]   Recommendations at discharge:   Patient to be discharged to skilled nursing facility for rehab   Discharge Diagnoses: Principal Problem:   Rhabdomyolysis Active Problems:   Essential hypertension   CKD stage 3a, GFR 45-59 ml/min (HCC)   Dementia (HCC)   Elevated troponin   Elevated transaminase level   Fall at home   Acute right hip pain  Resolved Problems:   * No resolved hospital problems. *  Hospital Course: 79 y.o. female with medical history significant for dementia, hypertension, and hyperlipidemia who presents with right hip pain after an unwitnessed fall at home.    Developed fever and was diagnosed with COVID 19 infection.   Hospitalization has been complicated by significant lower extremity pain limited her mobility in bed and ability to participate with therapy.     Assessment and Plan:  COVID 19 Infection Fever Fever suspected in setting of COVID 19 infection Could have contributed to fall with generalized weakness O2 sat generally above 95%, will hold off on any antivirals at this time.  No indication for steroids. Blood cultures Ngx5. Significantly elevated inflammatory markers - related to covid?  Seems out of proportion. Normal white count, lower suspicion for bacterial illness   Rhabdomyolysis UA with hemoglobin, 0-5 RBC - suggestive of myoglobinuria Continue IVF, will trend CK (improving) - CK is down to 799 - Will discontinue rosuvastatin   Swelling of right hand -Has tight name tag -Will remove name tag   Right Hip Pain Right Ankle Pain Lower Extremity Pain At home using walker at times and cane at times - living with son exam has been difficult.  Pain subjectively seems to be migratory.  She's not participating well with therapy due to  pain.  Extensive imaging has been done.  Arthrocentesis of R knee, not c/w septic arthritis, culture NGTD.  question whether pain is related to soft tissue/ligamentous injury. CT pelvis with severe bone on bone L hip arthrosis with juxtaarticular cystic changes  CT R knee with well seated hardware - small to moderate knee joint effusion Plain films of R knee and ankle with large plantar calcaneal spur with adjacent bone fragment, priminent plantar calcaneal spur with adjacent fragmentation Plain film R femur with enthesophyte at superior pole of patella with fragmentation - possible avulsion injury at superior pole of patella CT right ankle without notable acute findings (see report) Plain films L knee with intact hardware without periprosthetic fx or lucency CT L spine without acute lumbar spine fx or traumatic listhesis - post op changes noted.  Severe neuroforaminal narrowing.  Bridging osseous fusion/osteophytes. CT abd pelvis without acute abnormality in the abdomen/pelvis.   - Right side thigh pain has improved with topical  Voltaren  gel   Elevated Inflammatory Markers Unclear cause - seems out of proportion to covid Downtrending CRP down to 10.0 today Follow procalcitonin - 0.3 RF (mildly positive), anti CCP and ana are negative.    Elevated Troponin Not c/w ACS EKG reassuring Possibly related to rhabdo   AKI on CKD IIIa resolved   Dementia Delirium precautions This has made our exam/evaluation difficult -   Delevated AST In setting of rhabdo  - mild   Obesity, Class III Body mass index is 47.12 kg/m. Patient to go to skilled nursing facility  Consultants:  Procedures performed:  Disposition: Skilled nursing facility Diet recommendation:  Regular diet DISCHARGE MEDICATION: Allergies as of 05/15/2024       Reactions   Zestril  [lisinopril ] Swelling   Swelling in mouth and throat   Penicillins Rash   Ultram [tramadol Hcl] Rash         Medication List     STOP taking these medications    rosuvastatin 10 MG tablet Commonly known as: CRESTOR       TAKE these medications    acetaminophen  325 MG tablet Commonly known as: TYLENOL  Take 2 tablets (650 mg total) by mouth every 6 (six) hours as needed for mild pain (pain score 1-3), fever or headache.   diclofenac  Sodium 1 % Gel Commonly known as: VOLTAREN  Apply 2 g topically 3 (three) times daily as needed (Right thigh pain).   donepezil 5 MG tablet Commonly known as: ARICEPT Take 5 mg by mouth daily.   LORazepam  0.5 MG tablet Commonly known as: ATIVAN  Take 1 tablet (0.5 mg total) by mouth 2 (two) times daily as needed for anxiety.   melatonin 3 MG Tabs tablet Take 1 tablet (3 mg total) by mouth at bedtime as needed.   Multivitamin Women 50+ Tabs Take 1 tablet by mouth daily.   oxyCODONE  5 MG immediate release tablet Commonly known as: Oxy IR/ROXICODONE  Take 1 tablet (5 mg total) by mouth every 6 (six) hours as needed for moderate pain (pain score 4-6) or severe pain (pain score 7-10).   senna 8.6 MG Tabs tablet Commonly known as: SENOKOT Take 1 tablet (8.6 mg total) by mouth daily as needed for mild constipation or moderate constipation.               Durable Medical Equipment  (From admission, onward)           Start     Ordered   05/05/24 1036  For home use only DME Bedside commode  Once       Comments: Please provide a 3:1 since patient is unable to ambulate to bathroom in the home.  Question Answer Comment  Patient needs a bedside commode to treat with the following condition Rhabdomyolysis   Patient needs a bedside commode to treat with the following condition Physical deconditioning      05/05/24 1036   05/05/24 1032  For home use only DME 4 wheeled rolling walker with seat  Once       Question Answer Comment  Patient needs a walker to treat with the following condition Rhabdomyolysis   Patient needs a walker to treat with the  following condition Physical deconditioning      05/05/24 1036            Contact information for follow-up providers     Ernie Cough, MD Follow up in 4 week(s).   Specialty: Orthopedic Surgery Why: follow up for her LE joints Contact information: [address] [place] [address] [phone]              Contact information for after-discharge care     Destination     [facility], [place] .   Service: Skilled Nursing Contact information: [address] [place]  [phone]             Home Medical Care     [facility]) .   Service: Home Health Services Contact information: [address] [place]  [phone]  [phone]                    Discharge Exam: Fredricka Weights   05/03/24 1735  Weight: 120.7 kg   General-appears in no acute distress Heart-S1-S2, regular, no murmur auscultated Lungs-clear to auscultation bilaterally, no wheezing or crackles auscultated Abdomen-soft, nontender, no organomegaly Extremities-no edema in the lower extremities Neuro-alert, oriented x3, no focal deficit noted  Condition at discharge: good  The results of significant diagnostics from this hospitalization (including imaging, microbiology, ancillary and laboratory) are listed below for reference.   Imaging Studies: CT KNEE LEFT WO CONTRAST Result Date: 05/11/2024 EXAM: CT LEFT KNEE WITHOUT IV CONTRAST 05/10/2024 11:08:22 PM TECHNIQUE: Axial images were acquired through the left knee without IV contrast. Reformatted images were reviewed. Automated exposure control, iterative reconstruction, and/or weight based adjustment of the mA/kV was utilized to reduce the radiation dose to as low as reasonably achievable. COMPARISON: None provided. CLINICAL HISTORY: Left knee pain. FINDINGS: BONES: Surgical changes of left total knee arthroplasty are identified.  Normal alignment. No acute fracture or focal osseous lesion. JOINTS: No dislocation. The joint spaces are normal. SOFT TISSUES: Vascular calcifications are noted. Superficial varicosities were seen within the infrapopliteal visualized left lower extremity. IMPRESSION: 1. No acute osseous abnormality. 2. postsurgical changes of left total knee arthroplasty with normal alignment. Electronically signed by: Dorethia Molt MD MD 05/11/2024 02:49 AM EST RP Workstation: [id number]   CT ABDOMEN PELVIS WO CONTRAST Result Date: 05/11/2024 EXAM: CT ABDOMEN AND PELVIS WITHOUT CONTRAST 05/10/2024 11:08:22 PM TECHNIQUE: CT of the abdomen and pelvis was performed without the administration of intravenous contrast. Multiplanar reformatted images are provided for review. Automated exposure control, iterative reconstruction, and/or weight-based adjustment of the mA/kV was utilized to reduce the radiation dose to as low as reasonably achievable. COMPARISON: None available. CLINICAL HISTORY: pain after fall, bilateral hip pain FINDINGS: LOWER CHEST: No acute abnormality. LIVER: The liver is unremarkable. GALLBLADDER AND BILE DUCTS: Gallbladder is unremarkable. No biliary ductal dilatation. SPLEEN: No acute abnormality. PANCREAS: No acute abnormality. ADRENAL GLANDS: No acute abnormality. KIDNEYS, URETERS AND BLADDER: Simple cortical cyst within the right kidney for which no follow up imaging is recommended. No stones in the kidneys or ureters. No hydronephrosis. No perinephric or periureteral stranding. Urinary bladder is unremarkable. GI AND BOWEL: Stomach demonstrates no acute abnormality. Moderate sigmoid diverticulosis without superimposed acute inflammatory change. The appendix is absent. The small bowel and large bowel are otherwise unremarkable. There is no bowel obstruction. PERITONEUM AND RETROPERITONEUM: No ascites. No free air. VASCULATURE: Aorta is normal in caliber. Mild aortoiliac atherosclerotic calcification. No  aortic aneurysm. LYMPH NODES: No lymphadenopathy. REPRODUCTIVE ORGANS: Uterus is absent. No adnexal masses. BONES AND SOFT TISSUES: Mild right and severe left hip degenerative arthritis. T10-L5 thoracolumbar fusion with instrumentation and L2-L4 posterior decompression. No acute bone abnormality. No lytic or blastic bone lesion. No focal soft tissue abnormality. * raf score: Aortic atherosclerosis (icd10-i70.0), Aortic aneurysm (icd10-i71.9). IMPRESSION: 1. No acute abnormality in the abdomen or pelvis, and no acute osseous abnormality. 2. 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 3. Moderate sigmoid diverticulosis without acute inflammatory change. 4. Postsurgical changes of T10-L5 thoracolumbar fusion with instrumentation and L2-L4 posterior decompression. 5. RAF score includes Aortic atherosclerosis (icd10-i70.0). Electronically signed by: Dorethia Molt MD MD 05/11/2024 02:47 AM EST RP Workstation: [id number]   CT L-SPINE NO CHARGE Result Date: 05/11/2024 EXAM: CT OF THE LUMBAR SPINE WITHOUT CONTRAST 05/10/2024 11:08:22 PM TECHNIQUE: CT of the lumbar spine was performed without  the administration of intravenous contrast. Multiplanar reformatted images are provided for review. Automated exposure control, iterative reconstruction, and/or weight based adjustment of the mA/kV was utilized to reduce the radiation dose to as low as reasonably achievable. COMPARISON: CT LUMBAR SPINE WITH CONTRAST 09/10/2005. CLINICAL HISTORY: pain after fall FINDINGS: BONES AND ALIGNMENT: Lumbar fusion with instrumentation has been performed with bilateral pedicle screws and posterior bars at T10-L4 as well as a right unilateral pedicle screw and bar at L5. Anterior discectomy and interbody bone graft placement has been performed at T12-L3 with salt incorporation of interbody bone graft. Bilateral laminectomy and posterior decompression of L2-L4 has been performed as well as right L1 hemilaminectomy. There is bridging  callus identified involving the right facet joints of L1-S1 and left facet joints of T10-S1. Bridging osteophytes are seen at at least T8-L2 and L3-S1. There is anterolisthesis L4-L5. No acute fracture of the lumbar spine. Vertebral body height is preserved. No acute traumatic listhesis. DEGENERATIVE CHANGES: Severe right neuroforaminal narrowing noted at T11-T12 and L2-L3. Severe left neuroforaminal narrowing noted at L2-L3. No high-grade canal stenosis. SOFT TISSUES: No paraspinal fluid collection or inflammatory change identified. IMPRESSION: 1. No acute lumbar spine fracture or traumatic listhesis. 2. Postoperative changes of instrumented fusion T10-L4 with right-sided fixation at L5, anterior discectomy and interbody grafting T12-L3, and posterior decompression/laminectomies L2-L4. 3. Severe neuroforaminal narrowing on the right at T11-12 and L2-3, and on the left at L2-3, without high-grade central canal stenosis. 4. Chronic extensive bridging osseous fusion/osteophytes involving the facet joints and discs from T10-S1 with chronic anterolisthesis at L4-5. Electronically signed by: Dorethia Molt MD MD 05/11/2024 02:35 AM EST RP Workstation: [id number]   DG Knee 1-2 Views Left Result Date: 05/07/2024 EXAM: 1 or 2 VIEW(S) XRAY OF THE LEFT KNEE 05/07/2024 08:43:00 PM COMPARISON: Left knee x ray 12/09/2023. CLINICAL HISTORY: Pain. FINDINGS: BONES AND JOINTS: Left total knee arthroplasty noted. Intact orthopedic hardware without periprosthetic fracture or lucency. No malalignment. No significant joint effusion. SOFT TISSUES: Vascular calcifications. IMPRESSION: 1. Left total knee arthroplasty with intact orthopedic hardware without periprosthetic fracture or lucency. Electronically signed by: Greig Pique MD MD 05/07/2024 10:45 PM EST RP Workstation: [id number]   CT ANKLE RIGHT WO CONTRAST Result Date: 05/07/2024 CLINICAL DATA:  Foot and ankle pain. EXAM: CT OF THE RIGHT ANKLE WITHOUT CONTRAST TECHNIQUE:  Multidetector CT imaging of the right ankle was performed according to the standard protocol. Multiplanar CT image reconstructions were also generated. RADIATION DOSE REDUCTION: This exam was performed according to the departmental dose-optimization program which includes automated exposure control, adjustment of the mA and/or kV according to patient size and/or use of iterative reconstruction technique. COMPARISON:  Radiographs dated 05/05/2024. FINDINGS: Bones/Joint/Cartilage No acute fracture or dislocation. Prominent plantar calcaneal spur with thickening of the central cord of the plantar fascia, compatible with chronic plantar fasciitis. Acute plantar fasciitis can not be excluded. 3 mm ossicle within the substance of the origin of the central cord of the plantar fascia likely reflects chronic enthesopathic change. Mild enthesopathy at the calcaneal insertion of the distal Achilles tendon with foci of mineralization. The distal Achilles tendon appears intact. Mild-to-moderate tibiotalar osteoarthritis with subcortical cystic changes. Mild-to-moderate osteoarthritis noted elsewhere throughout the hindfoot and midfoot, most pronounced at the second and third TMT joints with subcortical cystic changes. Ligaments Ligaments are suboptimally evaluated by CT. Muscles and Tendons Unremarkable. Soft tissue Generalized nonspecific subcutaneous edema of the ankle extending through the hindfoot. No loculated fluid collection. IMPRESSION: 1. Prominent plantar calcaneal spur with thickening  of the central cord of the plantar fascia, compatible with chronic plantar fasciitis. Acute plantar fasciitis can not be excluded. 2. 3 mm ossicle within the substance of the origin of the central cord of the plantar fascia likely reflects chronic enthesopathic change. 3. Mild enthesopathy at the calcaneal insertion of the distal Achilles tendon with foci of mineralization. The distal Achilles tendon appears intact. 4. Mild-to-moderate  tibiotalar osteoarthritis with subcortical cystic changes. Mild-to-moderate osteoarthritis noted elsewhere throughout the hindfoot and midfoot, most pronounced at the second and third TMT joints with subcortical cystic changes. 5. Generalized nonspecific subcutaneous edema of the ankle extending through the hindfoot. Electronically Signed   By: Harrietta Sherry M.D.   On: 05/07/2024 16:48   DG Ankle 2 Views Right Result Date: 05/05/2024 EXAM: 2 VIEW(S) XRAY OF THE _LATERALITY_ ANKLE 05/05/2024 08:16:00 PM CLINICAL HISTORY: Pain COMPARISON: None available. FINDINGS: BONES AND JOINTS: No acute fracture. No malalignment. Prominent plantar calcaneal spur. Fragmentation adjacent to the spur may represent plantar fasciitis. Tibiotalar and tibiofibular degenerative changes with osteophyte formation. Midfoot degenerative changes. SOFT TISSUES: Subcutaneous soft tissue edema. IMPRESSION: 1. Prominent plantar calcaneal spur with adjacent fragmentation, possibly representing plantar fasciitis. 2. Subcutaneous soft tissue edema. 3. Tibiotalar, tibiofibular, and midfoot degenerative changes with osteophyte formation. Electronically signed by: Elsie Gravely MD 05/05/2024 08:44 PM EST RP Workstation: [id number]   DG CHEST PORT 1 VIEW Result Date: 05/05/2024 EXAM: 1 VIEW(S) XRAY OF THE CHEST 05/05/2024 08:16:00 PM COMPARISON: Comparison with 05/12/2021. CLINICAL HISTORY: COVID FINDINGS: LUNGS AND PLEURA: Low lung volumes. No focal pulmonary opacity. No pleural effusion. No pneumothorax. HEART AND MEDIASTINUM: Mild cardiomegaly, likely accentuated by technique. Calcification of the aorta. BONES AND SOFT TISSUES: Cervical fusion hardware. Partially imaged lumbar fusion hardware. Surgical clips in LEFT lower neck. Degenerative changes in the spine. No acute osseous abnormality. IMPRESSION: 1. No acute findings. 2. Mild cardiomegaly, likely accentuated by technique. Electronically signed by: Elsie Gravely MD 05/05/2024 08:43 PM  EST RP Workstation: [id number]   DG FEMUR PORT, MIN 2 VIEWS RIGHT Result Date: 05/05/2024 EXAM: 2 VIEW(S) XRAY OF THE RIGHT FEMUR 05/05/2024 08:16:00 PM COMPARISON: Right hip 05/03/2024. CT right knee 05/04/2024. CLINICAL HISTORY: Pain. FINDINGS: BONES AND JOINTS: Right knee arthroplasty in place. Enthesophyte in the superior pole of the patella with fragmentation, possibly avulsion. Moderate right knee joint effusion. No malalignment. SOFT TISSUES: Unremarkable. IMPRESSION: 1. Enthesophyte at the superior pole of the patella with fragmentation, possibly representing avulsion injury. 2. Moderate right knee joint effusion. 3. Right knee arthroplasty in place. Electronically signed by: Elsie Gravely MD 05/05/2024 08:35 PM EST RP Workstation: [id number]   DG Foot 2 Views Right Result Date: 05/05/2024 EXAM: 1 or 2 VIEW(S) XRAY OF THE RIGHT FOOT 05/05/2024 08:16:00 PM COMPARISON: Right ankle 05/05/2024. CLINICAL HISTORY: Pain. FINDINGS: BONES AND JOINTS: No acute fracture. No malalignment. Large plantar calcaneal spur. Bone fragment adjacent to the spur may represent calcific tendinosis. Mild midfoot degenerative changes. SOFT TISSUES: Dorsal forefoot subcutaneous soft tissue edema. IMPRESSION: 1. Large plantar calcaneal spur with adjacent bone fragment, possibly representing calcific tendinosis. 2. Dorsal forefoot subcutaneous soft tissue edema. 3. Mild midfoot degenerative changes. Electronically signed by: Elsie Gravely MD 05/05/2024 08:25 PM EST RP Workstation: [id number]   CT KNEE RIGHT WO CONTRAST Result Date: 05/04/2024 CLINICAL DATA:  Pain after fall. EXAM: CT OF THE RIGHT KNEE WITHOUT CONTRAST TECHNIQUE: Multidetector CT imaging of the right knee was performed according to the standard protocol. Multiplanar CT image reconstructions were also generated. RADIATION DOSE REDUCTION: This exam was  performed according to the departmental dose-optimization program which includes automated exposure control,  adjustment of the mA and/or kV according to patient size and/or use of iterative reconstruction technique. COMPARISON:  None Available. FINDINGS: Bones/Joint/Cartilage Status post right total knee arthroplasty with associated streak artifact which limits evaluation of the surrounding bone and soft tissues. Hardware appears well seated with normal alignment. No significant periprosthetic lucency. No acute fracture identified. Small to moderate-sized knee joint effusion. Sclerotic appearance of the patella. Enthesopathy at the superior patellar pole. Muscles and Tendons No appreciable acute abnormality.  No intramuscular collection. Soft tissue No fluid collection or hematoma. IMPRESSION: 1. Status post right total knee arthroplasty with associated streak artifact which limits evaluation of the surrounding bone and soft tissues. Hardware appears well seated with normal alignment. No acute fracture identified. 2.  Small to moderate-sized knee joint effusion. 3. Sclerotic appearance of the patella. Enthesopathy at the superior patellar pole. Electronically Signed   By: Harrietta Sherry M.D.   On: 05/04/2024 12:38   CT PELVIS WO CONTRAST Result Date: 05/04/2024 EXAM: CT Pelvis, Without IV CONTRAST 05/04/2024 01:31:23 AM TECHNIQUE: Axial images were acquired through the pelvis without IV contrast. Reformatted images were reviewed. Automated exposure control, iterative reconstruction, and/or weight based adjustment of the mA/kV was utilized to reduce the radiation dose to as low as reasonably achievable. COMPARISON: AP pelvis and right hip series 05/03/2024, AP pelvis with left hip series 12/09/2023, and CT abdomen and pelvis with contrast 10/08/2016. CLINICAL HISTORY: Hip trauma, fracture suspected, xray done. FINDINGS: BONES: There is no evidence of fractures of the visualized lower lumbar spine, sacrum, coccyx, pelvis and proximal femurs. Lumbar dorsal fusion hardware is partially visualized with laminectomy at L4 and  a grade 2 chronic L4-L5 anterolisthesis. Mild osteopenia. JOINTS: No dislocation. Noted is severe bone-on-bone left hip arthrosis with multiple subchondral cysts of the acetabulum and femoral head, moderate osteophytosis. This was not seen in 2018 or on a hip series from 2022 and could be related to an inflammatory arthropathy or a burned-out septic arthropathy. There is no substantial joint effusion. There are mild features of arthrosis in the right hip, mild pelvic and trochanteric enthesopathy, and slight spurring at the symphysis and SI joints. SOFT TISSUES: There is a small umbilical fat hernia. INTRAPELVIC CONTENTS: Pelvic phleboliths. Sigmoid diverticulosis without acute inflammatory change. There is no pelvic fluid collection, mass or adenopathy. The uterus is surgically absent. No adnexal mass is seen. IMPRESSION: 1. No acute osseous abnormality. 2. Severe bone-on-bone left hip arthrosis with juxtaarticular cystic changes, new since 2018 and not seen on 2022 hip radiographs, which could be related to an inflammatory arthropathy or a burned-out septic arthropathy. No notable changes seen between 11/2023 and now. 3. Osteopenia and additional degenerative and postsurgical changes. Electronically signed by: Francis Quam MD 05/04/2024 01:55 AM EST RP Workstation: [id number]   CT Head Wo Contrast Result Date: 05/03/2024 EXAM: CT HEAD WITHOUT 05/03/2024 10:10:27 PM TECHNIQUE: CT of the head was performed without the administration of intravenous contrast. Automated exposure control, iterative reconstruction, and/or weight based adjustment of the mA/kV was utilized to reduce the radiation dose to as low as reasonably achievable. COMPARISON: 05/12/2021 CLINICAL HISTORY: Head trauma, minor (Age >= 65y) FINDINGS: BRAIN AND VENTRICLES: No acute intracranial hemorrhage. No mass effect or midline shift. No extra-axial fluid collection. No evidence of acute infarct. No hydrocephalus. Atrophy and chronic small vessel  disease throughout the deep white matter. ORBITS: No acute abnormality. SINUSES AND MASTOIDS: No acute abnormality. SOFT TISSUES AND  SKULL: No acute skull fracture. No acute soft tissue abnormality. IMPRESSION: 1. No acute intracranial abnormality. 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 No fracture, subluxation, or dislocation. SOFT TISSUES: The soft tissues are unremarkable. IMPRESSION: 1. No evidence of acute traumatic injury. 2. 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 Electronically signed by: Franky Crease MD 05/03/2024 07:24 PM EST RP Workstation: [id number]    Microbiology: Results for orders placed or performed during the hospital encounter of 05/03/24  Resp panel by RT-PCR (RSV, Flu A&B, Covid) Anterior Nasal Swab     Status: Abnormal   Collection Time: 05/05/24  7:40 AM   Specimen: Anterior Nasal Swab  Result Value Ref Range Status   SARS Coronavirus 2 by RT PCR POSITIVE (A) NEGATIVE Final   Influenza A by PCR NEGATIVE NEGATIVE Final   Influenza B by PCR NEGATIVE NEGATIVE Final    Comment: (NOTE) The Xpert Xpress SARS-CoV-2/FLU/RSV plus assay is intended as an aid in the diagnosis of influenza from Nasopharyngeal swab specimens and should not be used as a sole basis for treatment. Nasal washings and aspirates are unacceptable for Xpert Xpress SARS-CoV-2/FLU/RSV testing.  Fact Sheet for Patients: bloggercourse.com  Fact Sheet for Healthcare Providers: seriousbroker.it  This test is not yet approved or cleared by the [place]  FDA  and has been authorized for detection and/or diagnosis of SARS-CoV-2 by FDA under an Emergency Use Authorization (EUA). This EUA will remain in effect (meaning this test can be used) for the duration of the COVID-19 declaration under Section 564(b)(1) of the Act, 21 U.S.C. section 360bbb-3(b)(1), unless the authorization is terminated or revoked.     Resp Syncytial Virus by PCR NEGATIVE NEGATIVE Final    Comment: (NOTE) Fact Sheet for Patients: bloggercourse.com  Fact Sheet for Healthcare Providers: seriousbroker.it  This test is not yet approved or cleared by the [place]  FDA and has been authorized for detection and/or diagnosis of SARS-CoV-2 by FDA under an Emergency Use Authorization (EUA). This EUA will remain in effect (meaning this test can be used) for the duration of the COVID-19 declaration under Section 564(b)(1) of the Act, 21 U.S.C. section 360bbb-3(b)(1), unless the authorization is terminated or revoked.  Performed at [facility], [address] [place], [place] [address]   Culture, blood (Routine X 2) w Reflex to ID Panel     Status: None   Collection Time: 05/06/24 11:37 AM   Specimen: BLOOD LEFT HAND  Result Value Ref Range Status   Specimen Description BLOOD LEFT HAND  Final   Special Requests   Final    BOTTLES DRAWN AEROBIC ONLY Blood Culture results may not be optimal due to an inadequate volume of blood received in culture bottles   Culture   Final    NO GROWTH 5 DAYS Performed at [facility], [address], [place], [place] [address]    Report Status 05/11/2024 FINAL  Final  Culture, blood (Routine X 2) w Reflex to ID Panel     Status: None   Collection Time: 05/06/24 11:43 AM   Specimen: BLOOD RIGHT ARM  Result Value Ref Range Status  Specimen Description BLOOD RIGHT ARM  Final   Special Requests   Final    BOTTLES DRAWN AEROBIC AND ANAEROBIC Blood Culture adequate volume    Culture   Final    NO GROWTH 5 DAYS Performed at [facility], [address] [place], [place] [address]    Report Status 05/11/2024 FINAL  Final  Aerobic/Anaerobic Culture w Gram Stain (surgical/deep wound)     Status: None   Collection Time: 05/09/24  1:59 PM   Specimen: Synovium  Result Value Ref Range Status   Specimen Description SYNOVIAL  Final   Special Requests NONE  Final   Gram Stain   Final    RARE WBC PRESENT,BOTH PMN AND MONONUCLEAR NO ORGANISMS SEEN    Culture   Final    No growth aerobically or anaerobically. Performed at [facility], [address] [place] [address], [place], [place] [address]    Report Status 05/14/2024 FINAL  Final    Labs: CBC: Recent Labs  Lab 05/11/24 0531 05/12/24 0231  WBC 8.5 7.0  NEUTROABS 6.0  --   HGB 9.8* 9.6*  HCT 29.6* 29.0*  MCV 81.3 81.9  PLT 291 297   Basic Metabolic Panel: Recent Labs  Lab 05/09/24 1248 05/11/24 0531 05/12/24 0231  NA 134* 136 138  K 4.5 4.4 4.2  CL 106 105 105  CO2 17* 23 24  GLUCOSE 102* 113* 96  BUN 18 21 24*  CREATININE 0.90 1.04* 0.92  CALCIUM 8.8* 8.6* 8.4*  MG  --  2.0 2.0  PHOS  --  3.6 2.9   Liver Function Tests: Recent Labs  Lab 05/09/24 1248 05/11/24 0531  AST 72* 67*  ALT 25 34  ALKPHOS 109 184*  BILITOT 1.0 0.6  PROT 6.3* 5.6*  ALBUMIN  2.2* 2.4*   CBG: No results for input(s): GLUCAP in the last 168 hours.  Discharge time spent: greater than 30 minutes.  Signed: Sabas GORMAN Brod, MD [facility] 05/15/2024 "

## 2024-05-15 NOTE — TOC Transition Note (Addendum)
 Transition of Care [facility]) - Discharge Note   Patient Details  Name: [name] MRN: [id number] Date of Birth: 1946/01/10  Transition of Care [facility] CM/SW Contact:  Gwenn Julien [place], [place] Phone Number: 05/15/2024, 12:16 PM   Clinical Narrative:   Pt for dc to [facility] today. Spoke to [facility] in admissions who confirmed they are prepared to admit pt to room 602. Pt's son bedside and agreeable to dc plan. RN provided number for report and [facility] arranged for transport. SW signing off at dc.   Julien Gwenn, MSW, LCSW [phone] (coverage)      Final next level of care: Skilled Nursing Facility Barriers to Discharge: Barriers Resolved   Patient Goals and CMS Choice            Discharge Placement              Patient chooses bed at: [facility] Patient to be transferred to facility by: [facility] Name of family member notified: [name]/son Patient and family notified of of transfer: 05/15/24  Discharge Plan and Services Additional resources added to the After Visit Summary for                                       Social Drivers of Health (SDOH) Interventions SDOH Screenings   Food Insecurity: No Food Insecurity (05/04/2024)  Housing: Low Risk (05/04/2024)  Transportation Needs: Unknown (05/04/2024)  Utilities: Not At Risk (05/04/2024)  Social Connections: Moderately Integrated (05/04/2024)  Tobacco Use: Low Risk (05/04/2024)     Readmission Risk Interventions     No data to display

## 2024-06-01 ENCOUNTER — Emergency Department (HOSPITAL_COMMUNITY): Admitting: Certified Registered Nurse Anesthetist

## 2024-06-01 ENCOUNTER — Emergency Department (HOSPITAL_COMMUNITY)

## 2024-06-01 ENCOUNTER — Other Ambulatory Visit: Payer: Self-pay

## 2024-06-01 ENCOUNTER — Inpatient Hospital Stay (HOSPITAL_COMMUNITY)

## 2024-06-01 ENCOUNTER — Encounter (HOSPITAL_COMMUNITY): Payer: Self-pay

## 2024-06-01 ENCOUNTER — Encounter (HOSPITAL_COMMUNITY): Admission: EM | Payer: Self-pay | Source: Home / Self Care

## 2024-06-01 ENCOUNTER — Inpatient Hospital Stay (HOSPITAL_COMMUNITY): Admission: EM | Admit: 2024-06-01 | Source: Home / Self Care

## 2024-06-01 DIAGNOSIS — L89159 Pressure ulcer of sacral region, unspecified stage: Secondary | ICD-10-CM | POA: Diagnosis present

## 2024-06-01 DIAGNOSIS — L0390 Cellulitis, unspecified: Principal | ICD-10-CM

## 2024-06-01 DIAGNOSIS — A419 Sepsis, unspecified organism: Secondary | ICD-10-CM

## 2024-06-01 DIAGNOSIS — M726 Necrotizing fasciitis: Secondary | ICD-10-CM

## 2024-06-01 DIAGNOSIS — J9600 Acute respiratory failure, unspecified whether with hypoxia or hypercapnia: Secondary | ICD-10-CM

## 2024-06-01 DIAGNOSIS — D631 Anemia in chronic kidney disease: Secondary | ICD-10-CM

## 2024-06-01 DIAGNOSIS — E870 Hyperosmolality and hypernatremia: Secondary | ICD-10-CM

## 2024-06-01 DIAGNOSIS — R7401 Elevation of levels of liver transaminase levels: Secondary | ICD-10-CM

## 2024-06-01 DIAGNOSIS — N1831 Chronic kidney disease, stage 3a: Secondary | ICD-10-CM

## 2024-06-01 LAB — CBC WITH DIFFERENTIAL/PLATELET
Abs Immature Granulocytes: 0.76 10*3/uL — ABNORMAL HIGH (ref 0.00–0.07)
Basophils Absolute: 0 10*3/uL (ref 0.0–0.1)
Basophils Relative: 0 %
Eosinophils Absolute: 0 10*3/uL (ref 0.0–0.5)
Eosinophils Relative: 0 %
HCT: 29.1 % — ABNORMAL LOW (ref 36.0–46.0)
Hemoglobin: 8.9 g/dL — ABNORMAL LOW (ref 12.0–15.0)
Immature Granulocytes: 4 %
Lymphocytes Relative: 10 %
Lymphs Abs: 2 10*3/uL (ref 0.7–4.0)
MCH: 25.8 pg — ABNORMAL LOW (ref 26.0–34.0)
MCHC: 30.6 g/dL (ref 30.0–36.0)
MCV: 84.3 fL (ref 80.0–100.0)
Monocytes Absolute: 1.3 10*3/uL — ABNORMAL HIGH (ref 0.1–1.0)
Monocytes Relative: 7 %
Neutro Abs: 15.1 10*3/uL — ABNORMAL HIGH (ref 1.7–7.7)
Neutrophils Relative %: 79 %
Platelets: 401 10*3/uL — ABNORMAL HIGH (ref 150–400)
RBC: 3.45 MIL/uL — ABNORMAL LOW (ref 3.87–5.11)
RDW: 16.1 % — ABNORMAL HIGH (ref 11.5–15.5)
WBC: 19.2 10*3/uL — ABNORMAL HIGH (ref 4.0–10.5)
nRBC: 0 % (ref 0.0–0.2)

## 2024-06-01 LAB — CBC
HCT: 27.5 % — ABNORMAL LOW (ref 36.0–46.0)
Hemoglobin: 8.4 g/dL — ABNORMAL LOW (ref 12.0–15.0)
MCH: 25.9 pg — ABNORMAL LOW (ref 26.0–34.0)
MCHC: 30.5 g/dL (ref 30.0–36.0)
MCV: 84.9 fL (ref 80.0–100.0)
Platelets: 342 10*3/uL (ref 150–400)
RBC: 3.24 MIL/uL — ABNORMAL LOW (ref 3.87–5.11)
RDW: 16.2 % — ABNORMAL HIGH (ref 11.5–15.5)
WBC: 20.3 10*3/uL — ABNORMAL HIGH (ref 4.0–10.5)
nRBC: 0 % (ref 0.0–0.2)

## 2024-06-01 LAB — BLOOD GAS, ARTERIAL
Acid-base deficit: 0.8 mmol/L (ref 0.0–2.0)
Bicarbonate: 25.4 mmol/L (ref 20.0–28.0)
Drawn by: 56037
FIO2: 100 %
O2 Saturation: 100 %
PEEP: 5 cmH2O
Patient temperature: 36.3
RATE: 14 {breaths}/min
pCO2 arterial: 46 mmHg (ref 32–48)
pH, Arterial: 7.35 (ref 7.35–7.45)
pO2, Arterial: 278 mmHg — ABNORMAL HIGH (ref 83–108)

## 2024-06-01 LAB — COMPREHENSIVE METABOLIC PANEL WITH GFR
ALT: 17 U/L (ref 0–44)
ALT: 13 U/L (ref 0–44)
AST: 69 U/L — ABNORMAL HIGH (ref 15–41)
AST: 43 U/L — ABNORMAL HIGH (ref 15–41)
Albumin: 2.5 g/dL — ABNORMAL LOW (ref 3.5–5.0)
Albumin: 2.1 g/dL — ABNORMAL LOW (ref 3.5–5.0)
Alkaline Phosphatase: 170 U/L — ABNORMAL HIGH (ref 38–126)
Alkaline Phosphatase: 122 U/L (ref 38–126)
Anion gap: 11 (ref 5–15)
Anion gap: 14 (ref 5–15)
BUN: 56 mg/dL — ABNORMAL HIGH (ref 8–23)
BUN: 52 mg/dL — ABNORMAL HIGH (ref 8–23)
CO2: 26 mmol/L (ref 22–32)
CO2: 20 mmol/L — ABNORMAL LOW (ref 22–32)
Calcium: 9.1 mg/dL (ref 8.9–10.3)
Calcium: 8.6 mg/dL — ABNORMAL LOW (ref 8.9–10.3)
Chloride: 112 mmol/L — ABNORMAL HIGH (ref 98–111)
Chloride: 112 mmol/L — ABNORMAL HIGH (ref 98–111)
Creatinine, Ser: 1.02 mg/dL — ABNORMAL HIGH (ref 0.44–1.00)
Creatinine, Ser: 0.94 mg/dL (ref 0.44–1.00)
GFR, Estimated: 56 mL/min — ABNORMAL LOW
GFR, Estimated: >60 mL/min
Glucose, Bld: 128 mg/dL — ABNORMAL HIGH (ref 70–99)
Glucose, Bld: 140 mg/dL — ABNORMAL HIGH (ref 70–99)
Potassium: 3.7 mmol/L (ref 3.5–5.1)
Potassium: 4.4 mmol/L (ref 3.5–5.1)
Sodium: 149 mmol/L — ABNORMAL HIGH (ref 135–145)
Sodium: 146 mmol/L — ABNORMAL HIGH (ref 135–145)
Total Bilirubin: 0.8 mg/dL (ref 0.0–1.2)
Total Bilirubin: 0.8 mg/dL (ref 0.0–1.2)
Total Protein: 7.7 g/dL (ref 6.5–8.1)
Total Protein: 5.8 g/dL — ABNORMAL LOW (ref 6.5–8.1)

## 2024-06-01 LAB — GLUCOSE, CAPILLARY
Glucose-Capillary: 121 mg/dL — ABNORMAL HIGH (ref 70–99)
Glucose-Capillary: 136 mg/dL — ABNORMAL HIGH (ref 70–99)

## 2024-06-01 LAB — PROTIME-INR
INR: 1.7 — ABNORMAL HIGH (ref 0.8–1.2)
INR: 1.7 — ABNORMAL HIGH (ref 0.8–1.2)
Prothrombin Time: 20.8 s — ABNORMAL HIGH (ref 11.4–15.2)
Prothrombin Time: 21.1 s — ABNORMAL HIGH (ref 11.4–15.2)

## 2024-06-01 LAB — URINALYSIS, W/ REFLEX TO CULTURE (INFECTION SUSPECTED)
Bacteria, UA: NONE SEEN
Bilirubin Urine: NEGATIVE
Glucose, UA: NEGATIVE mg/dL
Hgb urine dipstick: NEGATIVE
Ketones, ur: NEGATIVE mg/dL
Leukocytes,Ua: NEGATIVE
Nitrite: NEGATIVE
Protein, ur: 30 mg/dL — AB
Specific Gravity, Urine: 1.035 — ABNORMAL HIGH (ref 1.005–1.030)
pH: 5 (ref 5.0–8.0)

## 2024-06-01 LAB — I-STAT CG4 LACTIC ACID, ED: Lactic Acid, Venous: 1.7 mmol/L (ref 0.5–1.9)

## 2024-06-01 LAB — LACTIC ACID, PLASMA: Lactic Acid, Venous: 1.5 mmol/L (ref 0.5–1.9)

## 2024-06-01 LAB — APTT: aPTT: 28 s (ref 24–36)

## 2024-06-01 LAB — MRSA NEXT GEN BY PCR, NASAL: MRSA by PCR Next Gen: NOT DETECTED

## 2024-06-01 MED ORDER — LACTATED RINGERS IV SOLN: INTRAVENOUS | Status: AC

## 2024-06-01 MED ORDER — ORAL CARE MOUTH RINSE: 15.0000 mL | OROMUCOSAL | Status: AC | PRN

## 2024-06-01 MED ORDER — ONDANSETRON HCL 4 MG/2ML IJ SOLN
4.0000 mg | Freq: Four times a day (QID) | INTRAMUSCULAR | Status: AC | PRN
  Administered 2024-06-04: 4 mg via INTRAVENOUS

## 2024-06-01 MED ORDER — CLINDAMYCIN PHOSPHATE 600 MG/50ML IV SOLN
600.0000 mg | Freq: Once | INTRAVENOUS | Status: AC
  Administered 2024-06-01: 600 mg via INTRAVENOUS
  Filled 2024-06-01: qty 50

## 2024-06-01 MED ORDER — ONDANSETRON HCL 4 MG/2ML IJ SOLN: 4.0000 mg | Freq: Four times a day (QID) | INTRAMUSCULAR | Status: DC | PRN

## 2024-06-01 MED ORDER — LINEZOLID 600 MG/300ML IV SOLN
600.0000 mg | Freq: Two times a day (BID) | INTRAVENOUS | Status: DC
  Administered 2024-06-02 – 2024-06-04 (×6): 600 mg via INTRAVENOUS
  Filled 2024-06-01 (×6): qty 300

## 2024-06-01 MED ORDER — FENTANYL BOLUS VIA INFUSION
25.0000 ug | INTRAVENOUS | Status: AC | PRN
  Administered 2024-06-02: 75 ug via INTRAVENOUS
  Administered 2024-06-02 (×2): 50 ug via INTRAVENOUS
  Administered 2024-06-03: 25 ug via INTRAVENOUS
  Administered 2024-06-03: 50 ug via INTRAVENOUS
  Administered 2024-06-04 (×2): 100 ug via INTRAVENOUS
  Administered 2024-06-04: 50 ug via INTRAVENOUS
  Administered 2024-06-04 (×8): 100 ug via INTRAVENOUS
  Administered 2024-06-04: 50 ug via INTRAVENOUS

## 2024-06-01 MED ORDER — MORPHINE SULFATE (PF) 2 MG/ML IV SOLN
2.0000 mg | Freq: Once | INTRAVENOUS | Status: AC
  Administered 2024-06-01: 2 mg via INTRAVENOUS
  Filled 2024-06-01: qty 1

## 2024-06-01 MED ORDER — FENTANYL CITRATE (PF) 100 MCG/2ML IJ SOLN
INTRAMUSCULAR | Status: AC
  Filled 2024-06-01: qty 2

## 2024-06-01 MED ORDER — ROCURONIUM BROMIDE 10 MG/ML (PF) SYRINGE
PREFILLED_SYRINGE | INTRAVENOUS | Status: AC
  Filled 2024-06-01: qty 10

## 2024-06-01 MED ORDER — IOHEXOL 300 MG/ML  SOLN
80.0000 mL | Freq: Once | INTRAMUSCULAR | Status: AC | PRN
  Administered 2024-06-01: 80 mL via INTRAVENOUS

## 2024-06-01 MED ORDER — PROPOFOL 10 MG/ML IV BOLUS
INTRAVENOUS | Status: DC | PRN
  Administered 2024-06-01: 150 mg via INTRAVENOUS

## 2024-06-01 MED ORDER — VITAL HP 1.0 CAL PO LIQD: 1000.0000 mL | ORAL | Status: DC

## 2024-06-01 MED ORDER — MIDAZOLAM HCL 2 MG/2ML IJ SOLN
INTRAMUSCULAR | Status: AC
  Filled 2024-06-01: qty 2

## 2024-06-01 MED ORDER — HYDROMORPHONE HCL 1 MG/ML IJ SOLN
0.5000 mg | INTRAMUSCULAR | Status: AC | PRN
  Administered 2024-06-04 (×2): .5 mg via INTRAVENOUS

## 2024-06-01 MED ORDER — FENTANYL CITRATE (PF) 50 MCG/ML IJ SOSY
25.0000 ug | PREFILLED_SYRINGE | Freq: Once | INTRAMUSCULAR | Status: AC
  Administered 2024-06-01: 50 ug via INTRAVENOUS

## 2024-06-01 MED ORDER — LACTATED RINGERS IV BOLUS (SEPSIS)
1000.0000 mL | Freq: Once | INTRAVENOUS | Status: AC
  Administered 2024-06-01: 1000 mL via INTRAVENOUS

## 2024-06-01 MED ORDER — DONEPEZIL HCL 10 MG PO TABS
5.0000 mg | ORAL_TABLET | Freq: Every day | ORAL | Status: AC
  Administered 2024-06-01 – 2024-06-04 (×4): 5 mg
  Filled 2024-06-01 (×4): qty 1

## 2024-06-01 MED ORDER — ORAL CARE MOUTH RINSE
15.0000 mL | OROMUCOSAL | Status: AC
  Administered 2024-06-01 – 2024-06-04 (×35): 15 mL via OROMUCOSAL

## 2024-06-01 MED ORDER — SODIUM CHLORIDE 0.9 % IV SOLN
2.0000 g | Freq: Three times a day (TID) | INTRAVENOUS | Status: DC
  Administered 2024-06-01 – 2024-06-03 (×5): 2 g via INTRAVENOUS
  Filled 2024-06-01 (×5): qty 12.5

## 2024-06-01 MED ORDER — ACETAMINOPHEN 650 MG RE SUPP: 650.0000 mg | Freq: Four times a day (QID) | RECTAL | Status: DC | PRN

## 2024-06-01 MED ORDER — VANCOMYCIN HCL 2000 MG/400ML IV SOLN
2000.0000 mg | Freq: Once | INTRAVENOUS | Status: AC
  Administered 2024-06-01: 2000 mg via INTRAVENOUS
  Filled 2024-06-01: qty 400

## 2024-06-01 MED ORDER — ACETAMINOPHEN 325 MG PO TABS: 650.0000 mg | ORAL_TABLET | Freq: Four times a day (QID) | ORAL | Status: DC | PRN

## 2024-06-01 MED ORDER — DONEPEZIL HCL 10 MG PO TABS: 5.0000 mg | ORAL_TABLET | Freq: Every day | ORAL | Status: DC

## 2024-06-01 MED ORDER — FREE WATER
200.0000 mL | Status: DC
  Administered 2024-06-01 – 2024-06-03 (×8): 200 mL

## 2024-06-01 MED ORDER — ONDANSETRON HCL 4 MG PO TABS: 4.0000 mg | ORAL_TABLET | Freq: Four times a day (QID) | ORAL | Status: AC | PRN

## 2024-06-01 MED ORDER — ROCURONIUM BROMIDE 10 MG/ML (PF) SYRINGE
PREFILLED_SYRINGE | INTRAVENOUS | Status: DC | PRN
  Administered 2024-06-01: 60 mg via INTRAVENOUS
  Administered 2024-06-01: 40 mg via INTRAVENOUS

## 2024-06-01 MED ORDER — SODIUM CHLORIDE 0.9 % IV SOLN: INTRAVENOUS | Status: AC | PRN

## 2024-06-01 MED ORDER — LIDOCAINE 2% (20 MG/ML) 5 ML SYRINGE
INTRAMUSCULAR | Status: DC | PRN
  Administered 2024-06-01: 100 mg via INTRAVENOUS

## 2024-06-01 MED ORDER — POLYETHYLENE GLYCOL 3350 17 G PO PACK
17.0000 g | PACK | Freq: Every day | ORAL | Status: AC
  Administered 2024-06-02: 17 g
  Filled 2024-06-01: qty 1

## 2024-06-01 MED ORDER — LACTATED RINGERS IV BOLUS
1000.0000 mL | Freq: Once | INTRAVENOUS | Status: AC
  Administered 2024-06-01: 1000 mL via INTRAVENOUS

## 2024-06-01 MED ORDER — STERILE WATER FOR IRRIGATION IR SOLN
Status: DC | PRN
  Administered 2024-06-01: 500 mL

## 2024-06-01 MED ORDER — FENTANYL CITRATE (PF) 250 MCG/5ML IJ SOLN
INTRAMUSCULAR | Status: DC | PRN
  Administered 2024-06-01: 100 ug via INTRAVENOUS

## 2024-06-01 MED ORDER — METRONIDAZOLE 500 MG/100ML IV SOLN
500.0000 mg | Freq: Two times a day (BID) | INTRAVENOUS | Status: AC
  Administered 2024-06-01 – 2024-06-04 (×7): 500 mg via INTRAVENOUS
  Filled 2024-06-01 (×7): qty 100

## 2024-06-01 MED ORDER — FENTANYL 2500MCG IN NS 250ML (10MCG/ML) PREMIX INFUSION
0.0000 ug/h | INTRAVENOUS | Status: AC
  Administered 2024-06-03: 100 ug/h via INTRAVENOUS
  Administered 2024-06-04: 75 ug/h via INTRAVENOUS
  Filled 2024-06-01 (×2): qty 250

## 2024-06-01 MED ORDER — SUGAMMADEX SODIUM 200 MG/2ML IV SOLN
INTRAVENOUS | Status: AC
  Filled 2024-06-01: qty 2

## 2024-06-01 MED ORDER — CHLORHEXIDINE GLUCONATE CLOTH 2 % EX PADS
6.0000 | MEDICATED_PAD | Freq: Every day | CUTANEOUS | Status: AC
  Administered 2024-06-01 – 2024-06-04 (×4): 6 via TOPICAL

## 2024-06-01 MED ORDER — PANTOPRAZOLE SODIUM 40 MG IV SOLR
40.0000 mg | INTRAVENOUS | Status: AC
  Administered 2024-06-01 – 2024-06-04 (×4): 40 mg via INTRAVENOUS
  Filled 2024-06-01 (×4): qty 10

## 2024-06-01 MED ORDER — PROPOFOL 10 MG/ML IV BOLUS
INTRAVENOUS | Status: AC
  Filled 2024-06-01: qty 20

## 2024-06-01 MED ORDER — ALBUTEROL SULFATE (2.5 MG/3ML) 0.083% IN NEBU: 2.5000 mg | INHALATION_SOLUTION | RESPIRATORY_TRACT | Status: AC | PRN

## 2024-06-01 MED ORDER — PHENYLEPHRINE 80 MCG/ML (10ML) SYRINGE FOR IV PUSH (FOR BLOOD PRESSURE SUPPORT)
PREFILLED_SYRINGE | INTRAVENOUS | Status: DC | PRN
  Administered 2024-06-01: 240 ug via INTRAVENOUS
  Administered 2024-06-01 (×2): 160 ug via INTRAVENOUS

## 2024-06-01 MED ORDER — PROPOFOL 1000 MG/100ML IV EMUL
0.0000 ug/kg/min | INTRAVENOUS | Status: AC
  Administered 2024-06-01 (×2): 50 ug/kg/min via INTRAVENOUS
  Administered 2024-06-01: 40 ug/kg/min via INTRAVENOUS
  Administered 2024-06-02: 20 ug/kg/min via INTRAVENOUS
  Administered 2024-06-02: 15 ug/kg/min via INTRAVENOUS
  Administered 2024-06-02: 40 ug/kg/min via INTRAVENOUS
  Administered 2024-06-02: 30 ug/kg/min via INTRAVENOUS
  Administered 2024-06-03: 20 ug/kg/min via INTRAVENOUS
  Administered 2024-06-03: 9.942 ug/kg/min via INTRAVENOUS
  Administered 2024-06-04: 15 ug/kg/min via INTRAVENOUS
  Administered 2024-06-04: 50 mg via INTRAVENOUS
  Filled 2024-06-01 (×3): qty 100
  Filled 2024-06-01: qty 200
  Filled 2024-06-01 (×4): qty 100

## 2024-06-01 MED ORDER — FENTANYL 2500MCG IN NS 250ML (10MCG/ML) PREMIX INFUSION
INTRAVENOUS | Status: AC
  Administered 2024-06-01: 50 ug/h via INTRAVENOUS
  Filled 2024-06-01: qty 250

## 2024-06-01 MED ORDER — ONDANSETRON HCL 4 MG/2ML IJ SOLN
INTRAMUSCULAR | Status: DC | PRN
  Administered 2024-06-01: 4 mg via INTRAVENOUS

## 2024-06-01 MED ORDER — MELATONIN 3 MG PO TABS: 3.0000 mg | ORAL_TABLET | Freq: Every evening | ORAL | Status: DC | PRN

## 2024-06-01 MED ORDER — SODIUM CHLORIDE 0.9 % IV SOLN
2.0000 g | Freq: Once | INTRAVENOUS | Status: AC
  Administered 2024-06-01: 2 g via INTRAVENOUS
  Filled 2024-06-01: qty 12.5

## 2024-06-01 MED ORDER — ONDANSETRON HCL 4 MG PO TABS: 4.0000 mg | ORAL_TABLET | Freq: Four times a day (QID) | ORAL | Status: DC | PRN

## 2024-06-01 MED ORDER — DEXAMETHASONE SOD PHOSPHATE PF 10 MG/ML IJ SOLN
INTRAMUSCULAR | Status: DC | PRN
  Administered 2024-06-01: 4 mg via INTRAVENOUS

## 2024-06-01 MED ORDER — SENNA 8.6 MG PO TABS
1.0000 | ORAL_TABLET | Freq: Two times a day (BID) | ORAL | Status: AC
  Administered 2024-06-02 – 2024-06-03 (×3): 8.6 mg
  Filled 2024-06-01 (×4): qty 1

## 2024-06-01 NOTE — Op Note (Signed)
 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   Postoperative Diagnosis: Sacral necrotizing soft tissue infection   Surgical Procedure: Sharp excisional debridement using scalpel and electrocautery to remove skin, subcutaneous tissue, fascia and some muscle involved in a necrotizing soft tissue infection of the sacrum measuring 24 cm wide by 24 cm tall by 6 cm deep  Operative Team Members:  Surgeons and Role:    * Juwuan Sedita, Deward PARAS, MD - Primary   Anesthesiologist: Dametri Ozburn Lamarr BRAVO, MD CRNA: Vincenzo Show, CRNA   Anesthesia: General   Fluids:  Total I/O In: 100 [IV Piggyback:100] Out: -   Complications: * No complications entered in OR log *  Drains:  none   Specimen:  ID Type Source Tests Collected by Time Destination  A : sacral wound tissue culture Tissue Soft Tissue, Other AEROBIC/ANAEROBIC CULTURE W GRAM STAIN (SURGICAL/DEEP WOUND) Bishop Vanderwerf, Deward PARAS, MD 06/01/2024 1738      Disposition:  ICU - intubated and hemodynamically stable.  Plan of Care: Continue inpatient care    Indications for Procedure: [name] is a 79 y.o. female who presented with a necrotizing soft tissue infection of the sacrum.  The infection tracked bilaterally along the rectum and up toward the pelvic musculature.  I recommended excisional debridement.  We discussed the procedure itself as well as its risk, benefits, and alternatives with the patient as well as the patient's son.  After full discussion all questions answered the patient granted consent to proceed.  We would proceed emergently to the emergency room.  Findings: 24 cm wide by 24 cm tall by 6 cm deep   Description of Procedure:   On the date see above the patient taken operating room suite and placed in prone position.  The perianal and sacral area were prepped and draped in usual sterile fashion.  Sharp excisional  debridement was performed using scalpel and electrocautery to remove skin, subcutaneous tissue, fascia and some muscle involved in a necrotizing soft tissue infection of the sacrum creating a wound measuring 24 cm wide by 24 cm tall by 6 cm deep.  The muscle was debrided under the sacral wound from the medial gluteus muscle until healthy bleeding muscle was encountered.  All wound edges were debrided until healthy bleeding tissues were encountered.  The Infection tracked from the sacrum down to the perianal area and tracked along the rectum toward the pelvic musculature.  Pockets of dishwater like purulent fluid were drained from these areas.  There were areas of crepitance consistent with a necrotizing infection which were all excised.  Once I was satisfied all infected tissues had been debrided, I directed my attention to hemostasis.  Hemostasis was obtained with electrocautery.  I then packed the wound with wet-to-dry packing using Curlex rolls.  A dressing was applied over this and the mesh underwear were applied.  The patient was transferred to the ICU intubated and in hemodynamically stable condition.  She will likely require returns to the operating room for additional debridement, we will evaluate the patient at bedside to determine whether this is needed.  She may benefit also from a loop colostomy creation.  We will discuss this with her family.  At the end of the case we reviewed the infection status of the case. Patient: [name] Emergency General Surgery Service Patient Case: Emergent Infection Present At Time Of Surgery (PATOS): Necrotizing soft tissue infection of the sacral and perianal area.  Deward Foy, MD General, Bariatric, & Minimally Invasive Surgery [facility], [place]  "

## 2024-06-01 NOTE — Transfer of Care (Signed)
 Immediate Anesthesia Transfer of Care Note  Patient: [name]  Procedure(s) Performed: DEBRIDEMENT, WOUND  Patient Location: ICU  Anesthesia Type:General  Level of Consciousness: sedated  Airway & Oxygen Therapy: Patient remains intubated per anesthesia plan  Post-op Assessment: Post -op Vital signs reviewed and stable  Post vital signs: Reviewed and stable  Last Vitals:  Vitals Value Taken Time  BP 132/76 06/01/24 18:29  Temp    Pulse 80 06/01/24 18:32  Resp 16 06/01/24 18:34  SpO2 100 % 06/01/24 18:32  Vitals shown include unfiled device data.  Last Pain:  Vitals:   06/01/24 1620  TempSrc: Oral  PainSc:          Complications: No notable events documented.

## 2024-06-01 NOTE — Progress Notes (Signed)
ET tube advanced 1cm per order.

## 2024-06-01 NOTE — Anesthesia Postprocedure Evaluation (Signed)
"   Anesthesia Post Note  Patient: [name]  Procedure(s) Performed: DEBRIDEMENT, WOUND     Patient location during evaluation: ICU Anesthesia Type: General Level of consciousness: patient remains intubated per anesthesia plan and sedated Pain management: pain level controlled Vital Signs Assessment: post-procedure vital signs reviewed and stable Respiratory status: patient remains intubated per anesthesia plan, patient on ventilator - see flowsheet for VS and respiratory function stable Cardiovascular status: stable Anesthetic complications: no   No notable events documented.  Last Vitals:  Vitals:   06/01/24 1620 06/01/24 1845  BP: (!) 156/90 133/74  Pulse: 83 79  Resp: 20 14  Temp: (!) 36.4 C   SpO2: 96% 100%    Last Pain:  Vitals:   06/01/24 1620  TempSrc: Oral  PainSc:                  [name]      "

## 2024-06-02 ENCOUNTER — Encounter (HOSPITAL_COMMUNITY): Payer: Self-pay | Admitting: Surgery

## 2024-06-02 DIAGNOSIS — D649 Anemia, unspecified: Secondary | ICD-10-CM

## 2024-06-02 DIAGNOSIS — Z9911 Dependence on respirator [ventilator] status: Secondary | ICD-10-CM | POA: Diagnosis not present

## 2024-06-02 DIAGNOSIS — R945 Abnormal results of liver function studies: Secondary | ICD-10-CM | POA: Diagnosis not present

## 2024-06-02 DIAGNOSIS — F0390 Unspecified dementia without behavioral disturbance: Secondary | ICD-10-CM

## 2024-06-02 DIAGNOSIS — A499 Bacterial infection, unspecified: Secondary | ICD-10-CM

## 2024-06-02 DIAGNOSIS — I959 Hypotension, unspecified: Secondary | ICD-10-CM | POA: Diagnosis not present

## 2024-06-02 DIAGNOSIS — A419 Sepsis, unspecified organism: Secondary | ICD-10-CM | POA: Diagnosis not present

## 2024-06-02 DIAGNOSIS — E870 Hyperosmolality and hypernatremia: Secondary | ICD-10-CM | POA: Diagnosis not present

## 2024-06-02 DIAGNOSIS — W19XXXA Unspecified fall, initial encounter: Secondary | ICD-10-CM | POA: Diagnosis not present

## 2024-06-02 DIAGNOSIS — L89153 Pressure ulcer of sacral region, stage 3: Secondary | ICD-10-CM | POA: Diagnosis not present

## 2024-06-02 DIAGNOSIS — I10 Essential (primary) hypertension: Secondary | ICD-10-CM

## 2024-06-02 DIAGNOSIS — I129 Hypertensive chronic kidney disease with stage 1 through stage 4 chronic kidney disease, or unspecified chronic kidney disease: Secondary | ICD-10-CM | POA: Diagnosis not present

## 2024-06-02 DIAGNOSIS — F03C0 Unspecified dementia, severe, without behavioral disturbance, psychotic disturbance, mood disturbance, and anxiety: Secondary | ICD-10-CM

## 2024-06-02 DIAGNOSIS — N1831 Chronic kidney disease, stage 3a: Secondary | ICD-10-CM | POA: Diagnosis not present

## 2024-06-02 DIAGNOSIS — M726 Necrotizing fasciitis: Secondary | ICD-10-CM | POA: Diagnosis not present

## 2024-06-02 DIAGNOSIS — S31000A Unspecified open wound of lower back and pelvis without penetration into retroperitoneum, initial encounter: Secondary | ICD-10-CM | POA: Diagnosis not present

## 2024-06-02 LAB — URINALYSIS, W/ REFLEX TO CULTURE (INFECTION SUSPECTED)
Bacteria, UA: NONE SEEN
Bilirubin Urine: NEGATIVE
Glucose, UA: NEGATIVE mg/dL
Hgb urine dipstick: NEGATIVE
Ketones, ur: NEGATIVE mg/dL
Leukocytes,Ua: NEGATIVE
Nitrite: NEGATIVE
Protein, ur: 30 mg/dL — AB
Specific Gravity, Urine: 1.034 — ABNORMAL HIGH (ref 1.005–1.030)
pH: 5 (ref 5.0–8.0)

## 2024-06-02 LAB — COMPREHENSIVE METABOLIC PANEL WITH GFR
ALT: 12 U/L (ref 0–44)
AST: 32 U/L (ref 15–41)
Albumin: 2.1 g/dL — ABNORMAL LOW (ref 3.5–5.0)
Alkaline Phosphatase: 126 U/L (ref 38–126)
Anion gap: 11 (ref 5–15)
BUN: 54 mg/dL — ABNORMAL HIGH (ref 8–23)
CO2: 23 mmol/L (ref 22–32)
Calcium: 8.4 mg/dL — ABNORMAL LOW (ref 8.9–10.3)
Chloride: 112 mmol/L — ABNORMAL HIGH (ref 98–111)
Creatinine, Ser: 1.03 mg/dL — ABNORMAL HIGH (ref 0.44–1.00)
GFR, Estimated: 56 mL/min — ABNORMAL LOW
Glucose, Bld: 188 mg/dL — ABNORMAL HIGH (ref 70–99)
Potassium: 4.1 mmol/L (ref 3.5–5.1)
Sodium: 145 mmol/L (ref 135–145)
Total Bilirubin: 0.6 mg/dL (ref 0.0–1.2)
Total Protein: 5.5 g/dL — ABNORMAL LOW (ref 6.5–8.1)

## 2024-06-02 LAB — PROTIME-INR
INR: 1.4 — ABNORMAL HIGH (ref 0.8–1.2)
Prothrombin Time: 17.6 s — ABNORMAL HIGH (ref 11.4–15.2)

## 2024-06-02 LAB — TRIGLYCERIDES: Triglycerides: 218 mg/dL — ABNORMAL HIGH

## 2024-06-02 LAB — CBC WITH DIFFERENTIAL/PLATELET
Abs Immature Granulocytes: 1.1 10*3/uL — ABNORMAL HIGH (ref 0.00–0.07)
Basophils Absolute: 0.1 10*3/uL (ref 0.0–0.1)
Basophils Relative: 0 %
Eosinophils Absolute: 0 10*3/uL (ref 0.0–0.5)
Eosinophils Relative: 0 %
HCT: 23.1 % — ABNORMAL LOW (ref 36.0–46.0)
Hemoglobin: 7.1 g/dL — ABNORMAL LOW (ref 12.0–15.0)
Immature Granulocytes: 4 %
Lymphocytes Relative: 9 %
Lymphs Abs: 2.4 10*3/uL (ref 0.7–4.0)
MCH: 26.1 pg (ref 26.0–34.0)
MCHC: 30.7 g/dL (ref 30.0–36.0)
MCV: 84.9 fL (ref 80.0–100.0)
Monocytes Absolute: 1.5 10*3/uL — ABNORMAL HIGH (ref 0.1–1.0)
Monocytes Relative: 6 %
Neutro Abs: 21.3 10*3/uL — ABNORMAL HIGH (ref 1.7–7.7)
Neutrophils Relative %: 81 %
Platelets: 372 10*3/uL (ref 150–400)
RBC: 2.72 MIL/uL — ABNORMAL LOW (ref 3.87–5.11)
RDW: 16.6 % — ABNORMAL HIGH (ref 11.5–15.5)
WBC: 26.5 10*3/uL — ABNORMAL HIGH (ref 4.0–10.5)
nRBC: 0 % (ref 0.0–0.2)

## 2024-06-02 LAB — LACTIC ACID, PLASMA
Lactic Acid, Venous: 1.4 mmol/L (ref 0.5–1.9)
Lactic Acid, Venous: 1.5 mmol/L (ref 0.5–1.9)
Lactic Acid, Venous: 1.8 mmol/L (ref 0.5–1.9)

## 2024-06-02 LAB — CBC
HCT: 25.6 % — ABNORMAL LOW (ref 36.0–46.0)
Hemoglobin: 7.8 g/dL — ABNORMAL LOW (ref 12.0–15.0)
MCH: 26 pg (ref 26.0–34.0)
MCHC: 30.5 g/dL (ref 30.0–36.0)
MCV: 85.3 fL (ref 80.0–100.0)
Platelets: 341 10*3/uL (ref 150–400)
RBC: 3 MIL/uL — ABNORMAL LOW (ref 3.87–5.11)
RDW: 16.3 % — ABNORMAL HIGH (ref 11.5–15.5)
WBC: 20.6 10*3/uL — ABNORMAL HIGH (ref 4.0–10.5)
nRBC: 0 % (ref 0.0–0.2)

## 2024-06-02 LAB — GLUCOSE, CAPILLARY
Glucose-Capillary: 147 mg/dL — ABNORMAL HIGH (ref 70–99)
Glucose-Capillary: 129 mg/dL — ABNORMAL HIGH (ref 70–99)
Glucose-Capillary: 115 mg/dL — ABNORMAL HIGH (ref 70–99)
Glucose-Capillary: 121 mg/dL — ABNORMAL HIGH (ref 70–99)
Glucose-Capillary: 120 mg/dL — ABNORMAL HIGH (ref 70–99)
Glucose-Capillary: 142 mg/dL — ABNORMAL HIGH (ref 70–99)

## 2024-06-02 MED ORDER — VITAL HP 1.0 CAL PO LIQD
1000.0000 mL | ORAL | Status: AC
  Administered 2024-06-02: 1000 mL

## 2024-06-02 MED ORDER — NOREPINEPHRINE 4 MG/250ML-% IV SOLN
0.0000 ug/min | INTRAVENOUS | Status: AC
  Administered 2024-06-02: 2 ug/min via INTRAVENOUS
  Administered 2024-06-03 – 2024-06-04 (×2): 3 ug/min via INTRAVENOUS
  Filled 2024-06-02 (×2): qty 250

## 2024-06-02 MED ORDER — ALBUMIN HUMAN 5 % IV SOLN
12.5000 g | Freq: Four times a day (QID) | INTRAVENOUS | Status: AC
  Administered 2024-06-02 (×2): 12.5 g via INTRAVENOUS
  Filled 2024-06-02 (×2): qty 250

## 2024-06-02 MED ORDER — LACTATED RINGERS IV BOLUS
1000.0000 mL | Freq: Once | INTRAVENOUS | Status: AC
  Administered 2024-06-02: 1000 mL via INTRAVENOUS

## 2024-06-02 MED ORDER — SODIUM CHLORIDE 0.9 % IV SOLN: 250.0000 mL | INTRAVENOUS | Status: AC

## 2024-06-02 MED ORDER — LACTATED RINGERS IV SOLN: INTRAVENOUS | Status: AC

## 2024-06-02 NOTE — Progress Notes (Signed)
" ° ° °  PROCEDURAL EXPEDITER PROGRESS NOTE  Patient Name: [name]  DOB:1946/04/01 Date of Admission: 06/01/2024  Date of Assessment:06/02/24   -------------------------------------------------------------------------------------------------------------------   Brief clinical summary: Going to OR for excisional debridement of sacral pressure would tomorrow.  Orders in place:   Yes   Communication with surgical team if no orders: none  Labs, test, and orders reviewed: yes  Requires surgical clearance:   No  What type of clearance: none  Clearance received: n/a  Barriers noted: none   Intervention provided by [facility] team: none  Barrier resolved:   not applicable   -------------------------------------------------------------------------------------------------------------------  [facility] Patient Care Command Expediter, Rexene LITTIE Kirks Please contact us  directly via secure chat (search for [facility]) or by calling us  at [phone] [facility]).  "

## 2024-06-02 NOTE — Progress Notes (Addendum)
 " PROGRESS NOTE    [name]  FMW:[id number] DOB: 1946-03-02 DOA: 06/01/2024 PCP: [facility] [address]   Chief Complaint: Fever, infected wound   HPI: [name] is a 79 y.o. female with medical history significant for dementia, diastolic dysfunction, GERD, hypertension, hyperlipidemia, recent hospitalization at [facility] in the middle of January due to a fall and rhabdomyolysis who is now being admitted to the hospital with a necrotizing sacral wound.  Patient is not able to provide any history, history is provided by her son who is at the bedside and visits her frequently at the facility to which she was discharged in January.  He thinks that she developed a sacral wound while she was at [facility], or at her nursing facility.  He states that they have been cleaning her and changing dressings on a daily basis.  Apparently today she had a fever at her facility, was sent from [facility] and rehab for evaluation.  Workup in the emergency department including CT as detailed below shows evidence of a deep necrotizing wound.  She has been seen in consultation by general surgery, who plans to take her to the OR urgently.     Subjective:    Hospital Course:    Assessment and Plan:  [name] is a 79 y.o. female with medical history significant for dementia, diastolic dysfunction, GERD, hypertension, hyperlipidemia, recent hospitalization at [facility] in the middle of January due to a fall and rhabdomyolysis who is now being admitted to the hospital with a necrotizing sacral wound.   Sepsis due to necrotizing sacral wound-meeting criteria with tachycardia, report of fever, leukocytosis.  Patient is hemodynamically stable, no evidence of endorgan dysfunction. -Inpatient admission to ICU on vent -Empiric IV antibiotics IV Flagyl , linezolid , cefepime  -Follow-up blood cultures, surgical cultures -to OR today. On vent, plans to return to OR 06/03/24 for further debridement - sedation/pain- propofol  &  fentanyl  - Patient is currently on 3 mics per minute of IV norepinephrine    Dementia-Aricept    Hypernatremia-likely due to significant dehydration in the setting of her sepsis. -Will continue LR infusion and monitor sodium daily   Chronic normocytic anemia-appears to be stable  GI prophy- protonix  IV   DVT prophylaxis: SCDs only due to impending surgery     Code Status: Full Code   Consults called: General Surgery     DVT prophylaxis: SCDs Start: 06/01/24 1524     Code Status: Full Code Family Communication: None available Disposition Plan: pending return OR, I suspect she will need SNF for extensive debridement Reason for continuing need for hospitalization: surgery, f/u cultures, may need wound vac  Objective: Vitals:   06/02/24 0615 06/02/24 0630 06/02/24 0645 06/02/24 0700  BP: (!) 94/51 (!) 90/52 (!) 91/49 (!) 90/53  Pulse: 64 64 64 64  Resp: 13 13 15 14   Temp: (!) 97 F (36.1 C) (!) 97 F (36.1 C) (!) 97 F (36.1 C) (!) 97 F (36.1 C)  TempSrc:      SpO2: 100% 100% 100% 100%  Weight:      Height:        Intake/Output Summary (Last 24 hours) at 06/02/2024 0735 Last data filed at 06/02/2024 9392 Gross per 24 hour  Intake 1689.35 ml  Output 575 ml  Net 1114.35 ml   Filed Weights   06/01/24 1100 06/01/24 2000 06/02/24 0451  Weight: 120.7 kg 97.3 kg 101.1 kg    Examination:  Patient is sedated on the vent.  She will open eyes  to command send to light touch HEENT: Pupils are equal and round, sluggish to light, disconjugate movements due to sedation ET tube and OG tube in place Regular rate and rhythm S1-S2 no murmurs rubs gallops Lungs are clear in the anterior and lateral lung field Abdomen: Is soft, nontender nondistended bowel sounds are present Extremities: No cyanosis clubbing or edema Foley catheter in place    Data Reviewed: I have personally reviewed following labs and imaging studies  CBC: Recent Labs  Lab 06/01/24 1106 06/01/24 2126  06/02/24 0135  WBC 19.2* 20.3* 20.6*  NEUTROABS 15.1*  --   --   HGB 8.9* 8.4* 7.8*  HCT 29.1* 27.5* 25.6*  MCV 84.3 84.9 85.3  PLT 401* 342 341   Basic Metabolic Panel: Recent Labs  Lab 06/01/24 1106 06/01/24 2126 06/02/24 0135  NA 149* 146* 145  K 3.7 4.4 4.1  CL 112* 112* 112*  CO2 26 20* 23  GLUCOSE 128* 140* 188*  BUN 56* 52* 54*  CREATININE 1.02* 0.94 1.03*  CALCIUM 9.1 8.6* 8.4*   GFR: Estimated Creatinine Clearance: 51.9 mL/min (A) (by C-G formula based on SCr of 1.03 mg/dL (H)). Liver Function Tests: Recent Labs  Lab 06/01/24 1106 06/01/24 2126 06/02/24 0135  AST 69* 43* 32  ALT 17 13 12   ALKPHOS 170* 122 126  BILITOT 0.8 0.8 0.6  PROT 7.7 5.8* 5.5*  ALBUMIN  2.5* 2.1* 2.1*   No results for input(s): LIPASE, AMYLASE in the last 168 hours. No results for input(s): AMMONIA in the last 168 hours. Coagulation Profile: Recent Labs  Lab 06/01/24 1106 06/01/24 2126  INR 1.7* 1.7*   Cardiac Enzymes: No results for input(s): CKTOTAL, CKMB, CKMBINDEX, TROPONINI in the last 168 hours. ProBNP, BNP (last 5 results) No results for input(s): PROBNP, BNP in the last 8760 hours. HbA1C: No results for input(s): HGBA1C in the last 72 hours. CBG: Recent Labs  Lab 06/01/24 2014 06/01/24 2309 06/02/24 0424  GLUCAP 121* 136* 147*   Lipid Profile: Recent Labs    06/02/24 0135  TRIG 218*   Thyroid  Function Tests: No results for input(s): TSH, T4TOTAL, FREET4, T3FREE, THYROIDAB in the last 72 hours. Anemia Panel: No results for input(s): VITAMINB12, FOLATE, FERRITIN, TIBC, IRON, RETICCTPCT in the last 72 hours. Sepsis Labs: Recent Labs  Lab 06/01/24 1156 06/01/24 2126 06/02/24 0135  LATICACIDVEN 1.7 1.5 1.4    Recent Results (from the past 240 hours)  Blood Culture (routine x 2)     Status: None (Preliminary result)   Collection Time: 06/01/24 11:40 AM   Specimen: BLOOD LEFT FOREARM  Result Value Ref Range  Status   Specimen Description   Final    BLOOD LEFT FOREARM Performed at [facility], [address] [place], [place] [address]    Special Requests   Final    BOTTLES DRAWN AEROBIC AND ANAEROBIC Blood Culture results may not be optimal due to an inadequate volume of blood received in culture bottles Performed at [facility], [address], [place], [place] [address]    Culture PENDING  Incomplete   Report Status PENDING  Incomplete  Blood Culture (routine x 2)     Status: None (Preliminary result)   Collection Time: 06/01/24 11:41 AM   Specimen: BLOOD RIGHT ARM  Result Value Ref Range Status   Specimen Description   Final    BLOOD RIGHT ARM Performed at [facility], [address] [place], [place] [address]  Special Requests   Final    BOTTLES DRAWN AEROBIC ONLY Blood Culture results may not be optimal due to an inadequate volume of blood received in culture bottles Performed at [facility], [address] [place], [place] [address]    Culture PENDING  Incomplete   Report Status PENDING  Incomplete  Aerobic/Anaerobic Culture w Gram Stain (surgical/deep wound)     Status: None (Preliminary result)   Collection Time: 06/01/24  5:38 PM   Specimen: Soft Tissue, Other  Result Value Ref Range Status   Specimen Description   Final    TISSUE Performed at [facility], [address], [place], [place] [address]    Special Requests SACRAL WD  Final   Gram Stain   Final    NO WBC SEEN ABUNDANT GRAM NEGATIVE RODS FEW GRAM POSITIVE COCCI Performed at [facility], [address] [place], [place] [address]    Culture PENDING  Incomplete   Report Status PENDING  Incomplete  MRSA Next Gen by PCR, Nasal     Status: None   Collection Time: 06/01/24  6:43 PM   Specimen: Nasal Mucosa; Nasal Swab  Result Value Ref Range Status   MRSA by PCR Next Gen NOT DETECTED NOT DETECTED Final    Comment: (NOTE) The  GeneXpert MRSA Assay (FDA approved for NASAL specimens only), is one component of a comprehensive MRSA colonization surveillance program. It is not intended to diagnose MRSA infection nor to guide or monitor treatment for MRSA infections. Test performance is not FDA approved in patients less than 68 years old. Performed at [facility], [address] [place], [place] [address]      Radiology Studies: DG Abd 1 View Result Date: 06/01/2024 EXAM: 1 VIEW XRAY OF THE ABDOMEN 06/01/2024 06:56:00 PM COMPARISON: None available. CLINICAL HISTORY: Encounter for imaging study to confirm nasogastric tube placement. FINDINGS: LINES, TUBES AND DEVICES: Enteric tube in place with tip and side port overlying the expected region of the gastric lumen. BOWEL: Nonobstructive bowel gas pattern. SOFT TISSUES: No abnormal calcifications. BONES: Thoracolumbar spine surgical hardware noted. IMPRESSION: 1. Enteric tube in appropriate position with tip and side port overlying the expected region of the gastric lumen. Electronically signed by: Norman Gatlin MD 06/01/2024 07:06 PM EST RP Workstation: [id number]   DG Chest Port 1 View Result Date: 06/01/2024 EXAM: 1 VIEW(S) XRAY OF THE CHEST 06/01/2024 06:55:00 PM COMPARISON: 06/01/2024 CLINICAL HISTORY: Endotracheally intubated. FINDINGS: LINES, TUBES AND DEVICES: Endotracheal tube in place with tip 4.8 cm above the carina. Enteric tube in place with tip and side port in the stomach. LUNGS AND PLEURA: No focal pulmonary opacity. No pleural effusion. No pneumothorax. HEART AND MEDIASTINUM: Aortic arch calcifications. No acute abnormality of the cardiac and mediastinal silhouettes. BONES AND SOFT TISSUES: Thoracolumbar fusion hardware noted. ACDF changes in lower cervical spine. No acute osseous abnormality. IMPRESSION: 1. Endotracheal tube tip 4.8 cm above the carina and enteric tube tip and side port in the stomach. Electronically signed by: Norman Gatlin MD  06/01/2024 07:05 PM EST RP Workstation: [id number]   CT ABDOMEN PELVIS W CONTRAST Result Date: 06/01/2024 CLINICAL DATA:  Sepsis.  Hip pain EXAM: CT ABDOMEN AND PELVIS WITH CONTRAST TECHNIQUE: Multidetector CT imaging of the abdomen and pelvis was performed using the standard protocol following bolus administration of intravenous contrast. RADIATION DOSE REDUCTION: This exam was performed according to the departmental dose-optimization program which includes automated exposure control, adjustment of the mA and/or kV  according to patient size and/or use of iterative reconstruction technique. CONTRAST:  80mL OMNIPAQUE  IOHEXOL  300 MG/ML  SOLN COMPARISON:  CT abdomen and pelvis dated 05/10/2024 FINDINGS: Lower chest: No focal consolidation or pulmonary nodule in the lung bases. Trace bilateral pleural effusions, right-greater-than-left. Partially imaged heart size is normal. Hepatobiliary: No focal hepatic lesions. No intra or extrahepatic biliary ductal dilation. Distended gallbladder. No pericholecystic free fluid. Pancreas: No focal lesions or main ductal dilation. Spleen: Normal in size without focal abnormality. Adrenals/Urinary Tract: No adrenal nodules. No hydronephrosis or calculi. 2.1 cm right interpolar simple cyst (2:32). Mild bilateral perinephric stranding. Somewhat heterogeneous enhancement of the bilateral renal parenchyma. Suspected posterior left lower pole cyst is somewhat obscured by beam hardening artifact from the lumbar spinal fusion hardware. Additional possible mildly hyperattenuating lesion arising from the posterior interpolar right kidney measuring 1.3 cm (12:33). No focal bladder wall thickening. Stomach/Bowel: Normal appearance of the stomach. No evidence of bowel wall thickening, distention, or inflammatory changes. Colonic diverticulosis without acute diverticulitis. Moderate volume stool throughout the colon. Appendix is not discretely seen. Vascular/Lymphatic: Aortic atherosclerosis.  No enlarged abdominal or pelvic lymph nodes. Reproductive: No adnexal masses. Other: Small volume presacral fluid. No free air or fluid collection. Musculoskeletal: No acute or abnormal lytic or blastic osseous lesions. Postsurgical changes of T10-L5 spinal fusion. Unchanged grade 2 anterolisthesis at L4-5. Subcutaneous soft tissue emphysema overlying the distal sacrococcygeal spine and extending anteriorly into the posterior perineum and perianal region and inferiorly along the bilateral medial buttocks. No definite cortical erosions underlying the area of subcutaneous emphysema. Surrounding subcutaneous soft tissue stranding. Degenerative changes of bilateral hips, left greater than right. IMPRESSION: 1. Findings are suspicious for necrotizing soft tissue infection overlying the distal sacrococcygeal spine and extending into the perineum. No CT findings of osteomyelitis. 2. Somewhat heterogeneous enhancement of the bilateral renal parenchyma with mild bilateral perinephric stranding. Recommend correlation with urinalysis to exclude pyelonephritis. 3. Possible mildly hyperattenuating lesion arising from the posterior interpolar right kidney measuring 1.3 cm. Recommend further evaluation with nonemergent renal protocol MRI or CT, as clinically indicated. 4. Trace bilateral pleural effusions, right-greater-than-left. Electronically Signed   By: Limin  Xu M.D.   On: 06/01/2024 14:17   DG Chest Port 1 View Result Date: 06/01/2024 CLINICAL DATA:  Questionable sepsis EXAM: PORTABLE CHEST 1 VIEW COMPARISON:  05/05/2024 FINDINGS: Numerous leads and wires project over the chest. Cervical spine fixation. Mild right hemidiaphragm elevation. Patient rotated minimally left. Mild cardiomegaly, accentuated by AP portable technique and low lung volumes. Atherosclerosis in the transverse aorta. No pleural effusion or pneumothorax. No congestive failure. Mild subsegmental atelectasis at the lung bases. IMPRESSION: 1. Low lung  volumes with mild bibasilar atelectasis. 2. Cardiomegaly without congestive failure. 3. Aortic Atherosclerosis (ICD10-I70.0). Electronically Signed   By: Rockey Kilts M.D.   On: 06/01/2024 12:58    Scheduled Meds:  Chlorhexidine  Gluconate Cloth  6 each Topical Daily   donepezil   5 mg Per Tube QHS   feeding supplement (VITAL HIGH PROTEIN)  1,000 mL Per Tube Q24H   free water   200 mL Per Tube Q4H   mouth rinse  15 mL Mouth Rinse Q2H   pantoprazole  (PROTONIX ) IV  40 mg Intravenous Q24H   polyethylene glycol  17 g Per Tube Daily   senna  1 tablet Per Tube BID   Continuous Infusions:  sodium chloride  10 mL/hr at 06/02/24 0607   albumin  human Stopped (06/02/24 0554)   ceFEPime  (MAXIPIME ) IV 2 g (06/02/24 9385)   fentaNYL  infusion  INTRAVENOUS 25 mcg/hr (06/02/24 9392)   lactated ringers  100 mL/hr at 06/01/24 1702   linezolid  (ZYVOX ) IV Stopped (06/02/24 0105)   metronidazole  Stopped (06/01/24 2323)   propofol  (DIPRIVAN ) infusion 30 mcg/kg/min (06/02/24 0607)     LOS: 1 day   Time spent: 50 minutes  Lonni KANDICE Moose, MD  [facility]  06/02/2024, 7:35 AM   "

## 2024-06-02 NOTE — Progress Notes (Signed)
 Progress Note: General Surgery Service   Chief Complaint/Subjective: Intubated, sedated  Objective: Vital signs in last 24 hours: Temp:  [96.3 F (35.7 C)-99.9 F (37.7 C)] 97 F (36.1 C) (02/04 0700) Pulse Rate:  [61-97] 64 (02/04 0700) Resp:  [13-22] 14 (02/04 0700) BP: (74-179)/(42-120) 90/53 (02/04 0700) SpO2:  [95 %-100 %] 100 % (02/04 0700) FiO2 (%):  [40 %-100 %] 40 % (02/04 0313) Weight:  [97.3 kg-120.7 kg] 101.1 kg (02/04 0451)    Intake/Output from previous day: 02/03 0701 - 02/04 0700 In: 1689.4 [I.V.:439.4; NG/GT:400; IV Piggyback:850] Out: 575 [Urine:525; Emesis/NG output:50] Intake/Output this shift: Total I/O In: 359.9 [I.V.:59.9; NG/GT:200; IV Piggyback:100] Out: -   Constitutional: NAD; conversant; no deformities Buttock - wound examined with nursing team at bedside, rectal tube inserted, edges and base of wound appear healthy, still some darker areas near rectum  Lab Results: CBC  Recent Labs    06/01/24 2126 06/02/24 0135  WBC 20.3* 20.6*  HGB 8.4* 7.8*  HCT 27.5* 25.6*  PLT 342 341   BMET Recent Labs    06/01/24 2126 06/02/24 0135  NA 146* 145  K 4.4 4.1  CL 112* 112*  CO2 20* 23  GLUCOSE 140* 188*  BUN 52* 54*  CREATININE 0.94 1.03*  CALCIUM 8.6* 8.4*   PT/INR Recent Labs    06/01/24 1106 06/01/24 2126  LABPROT 20.8* 21.1*  INR 1.7* 1.7*   ABG Recent Labs    06/01/24 2103  PHART 7.35  HCO3 25.4    Anti-infectives: Anti-infectives (From admission, onward)    Start     Dose/Rate Route Frequency Ordered Stop   06/01/24 2200  metroNIDAZOLE  (FLAGYL ) IVPB 500 mg        500 mg 100 mL/hr over 60 Minutes Intravenous Every 12 hours 06/01/24 1530     06/01/24 2200  linezolid  (ZYVOX ) IVPB 600 mg        600 mg 300 mL/hr over 60 Minutes Intravenous Every 12 hours 06/01/24 1536     06/01/24 2000  ceFEPIme  (MAXIPIME ) 2 g in sodium chloride  0.9 % 100 mL IVPB        2 g 200 mL/hr over 30 Minutes Intravenous Every 8 hours 06/01/24  1544     06/01/24 1445  clindamycin  (CLEOCIN ) IVPB 600 mg        600 mg 100 mL/hr over 30 Minutes Intravenous  Once 06/01/24 1441 06/01/24 1541   06/01/24 1145  vancomycin  (VANCOREADY) IVPB 2000 mg/400 mL        2,000 mg 200 mL/hr over 120 Minutes Intravenous  Once 06/01/24 1139 06/01/24 1435   06/01/24 1115  ceFEPIme  (MAXIPIME ) 2 g in sodium chloride  0.9 % 100 mL IVPB        2 g 200 mL/hr over 30 Minutes Intravenous  Once 06/01/24 1108 06/01/24 1225       Medications: Scheduled Meds:  Chlorhexidine  Gluconate Cloth  6 each Topical Daily   donepezil   5 mg Per Tube QHS   feeding supplement (VITAL HIGH PROTEIN)  1,000 mL Per Tube Q24H   free water   200 mL Per Tube Q4H   mouth rinse  15 mL Mouth Rinse Q2H   pantoprazole  (PROTONIX ) IV  40 mg Intravenous Q24H   polyethylene glycol  17 g Per Tube Daily   senna  1 tablet Per Tube BID   Continuous Infusions:  sodium chloride  Stopped (06/02/24 0751)   ceFEPime  (MAXIPIME ) IV Stopped (06/02/24 9353)   fentaNYL  infusion INTRAVENOUS 25 mcg/hr (06/02/24 0755)  lactated ringers  100 mL/hr at 06/01/24 1702   linezolid  (ZYVOX ) IV Stopped (06/02/24 0105)   metronidazole  Stopped (06/01/24 2323)   propofol  (DIPRIVAN ) infusion 30 mcg/kg/min (06/02/24 0755)   PRN Meds:.sodium chloride , acetaminophen  **OR** acetaminophen , albuterol , fentaNYL , HYDROmorphone  (DILAUDID ) injection, ondansetron  **OR** ondansetron  (ZOFRAN ) IV, mouth rinse  Assessment/Plan: [name] is a 79 year old female with a necrotizing soft tissue infection of the buttock and perianal area s/p excisional debridement on 06/01/24.  Wet to dry dressings Rectal tube Likely return to surgery Thursday to re-evaluate wound and possibly create colostomy to divert stool from wound.  Will see how well rectal tube functions for now.   LOS: 1 day    [name], MD  [facility] Use [email or web address] to contact on call provider  Daily Billing: [phone] - post op

## 2024-06-02 NOTE — Progress Notes (Signed)
 "  NAME:  [name], MRN:  [id number], DOB:  July 08, 1945, LOS: 1 ADMISSION DATE:  06/01/2024, CONSULTATION DATE:  06/01/2024 REFERRING MD:  Lyndel Mt MD CHIEF COMPLAINT:  Necrotizing Facientis    History of Present Illness:  [name] is a 79 y.o. female w/ PMH significant for HTN, dementia, HLD, diastolic dysfunction, and a recent hospitalization in January from a fall w/ subsequent rhabdo. Patient presented with fever and necrotizing sacral wound. Surgery consulted and taken immediately to OR for debridement.  Consulted post-op for continuation of intubation & sedation post op w/ tentative plans to return to OR 2/4.   HPI poor historian d/t intubation with hx obtain via chart review.  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   Pneumonia    years ago     Significant Hospital Events: Including procedures, antibiotic start and stop dates in addition to other pertinent events   2/3 Admitted to ICU intubated/vented s/p OR debridement  Interim History / Subjective:   Today intubated/sedate Bp soft  Objective   Blood pressure (!) 90/53, pulse 64, temperature (!) 97 F (36.1 C), resp. rate 14, height 5' 3 (1.6 m), weight 101.1 kg, SpO2 100%.    Vent Mode: PRVC FiO2 (%):  [40 %-100 %] 40 % Set Rate:  [14 bmp] 14 bmp Vt Set:  [410 mL] 410 mL PEEP:  [5 cmH20] 5 cmH20 Plateau Pressure:  [14 cmH20] 14 cmH20   Intake/Output Summary (Last 24 hours) at 06/02/2024 9157 Last data filed at 06/02/2024 9196 Gross per 24 hour  Intake 2049.27 ml  Output 575 ml  Net 1474.27 ml   Filed Weights   06/01/24 1100 06/01/24 2000 06/02/24 0451  Weight: 120.7 kg 97.3  kg 101.1 kg    Examination: General: critically ill appearing on mech vent HEENT: MM pink/moist; ETT in place Neuro: sedate CV: s1s2, RRR, no m/r/g PULM:  dim clear BS bilaterally; on mech vent PRVC GI: soft, bsx4 active  Extremities: warm/dry, no edema  Skin: no rashes or lesions     Resolved Hospital Problem list    Assessment & Plan:   Sepsis 2/2 to nec fasc from sacral wound s/p debridement on 2/3 Plan: -cont broad spectrum abx -follow cultures -plan to go back to OR on 2/5 -surgery following; appreciate recs -LR fluids -flexi currently in place; possibly plan to creat colostomy to divert stool from wound on 2/5  Hypotension Plan: -wean sedation for RASS 0 to -1 -iv fluids -consider levo if map goal <65 -abx as above   Post op vent management Plan: -rest on vent today; plan to go to OR tomorrow -LTVV strategy with tidal volumes of 6-8 cc/kg ideal body weight -Wean PEEP/FiO2 for SpO2 >92% -VAP bundle in place -Daily SAT and SBT when appropriate -PAD protocol in place -wean sedation for RASS goal 0 to -1   Hypernatremia: likely hypovolemia 2/2 to sepsis; improving CKD IIIa Plan: -fluids -cont free water  -Trend BMP / urinary output -Replace electrolytes as indicated -Avoid nephrotoxic agents, ensure adequate renal perfusion  Dementia -resume home aricept   Mildly elevated LFTS -  improving; trend  Anemia of Chronic Disease - trend CBC    Critical care time: The patient is critically ill with multiple organ system failure and requires high complexity decision making for assessment and support, frequent evaluation and titration of therapies, advanced monitoring, review of radiographic studies and interpretation of complex data.    Critical Care Time devoted to patient care services, exclusive of separately billable procedures, described in this note is 35 min  Norleen JONETTA Cedar, PA-C [facility] 06/02/24 8:42 AM  Please see  [email or web address] for pager details  From 7A-7P. If no response, please call [phone] After hours, please call Elink: [phone]            "

## 2024-06-02 NOTE — Consult Note (Signed)
 "       Date of Admission:  06/01/2024          Reason for Consult: Necrotizing polymicrobial infection    Referring Provider: Lonni Moose, MD   Assessment:  Necrotizing polymicrobial infection status post I&D Advanced dementia currently bedbound Admission earlier this month with rhabdomyolysis Hypertension   Plan:  Continue cefepime  and Flagyl  and swap and Zyvox  for the vancomycin  Follow-up culture data in the operating room Surgery following closely Standard universal precautions     HPI: [name] is a 79 year old woman with dementia hypertension hyperlipidemia diastolic dysfunction with hospitalization in January from fall with subsequent rhabdomyolysis who appears to be bedbound who presented with fever necrotizing sacral soft tissue infection General Surgery were consulted take the patient to the operating room immediately for debridement.  Upon admission the patient had blood cultures taken and she was started on vancomycin  and metronidazole  cefepime  but also got doses of clindamycin  and linezolid  currently on vancomycin  metronidazole  cefepime .  Blood cultures are without growth and cultures in the operating room showed few gram-positive cocci as well as abundant gram-negative rods and cultures read as being too young to read.  Postoperatively she was taken to the intensive care unit for management of her on the ventilator.    Think exchanging vancomycin  for Zyvox  will provide her with better renal safety and also provide the toxin in addition that Zyvox  and clindamycin  can accomplish.  I think the Flagyl  is fine for anaerobic coverage along with cefepime  to continue the gram-negative coverage will follow-up the operative cultures.   I personally spent a total of 80 minutes in the care of the patient today including preparing to see the patient, getting/reviewing separately obtained history, performing a medically appropriate exam/evaluation, counseling  and educating, placing orders, referring and communicating with other health care professionals, and documenting clinical information in the EHR.   Evaluation of the patient requires complex antimicrobial therapy evaluation, counseling , isolation needs to reduce disease transmission and risk assessment and mitigation.     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   Pneumonia    years ago    Social History[1]  Family History  Problem Relation Age of Onset   Ovarian cancer Mother    Heart disease Mother    Prostate cancer Father    Heart disease Father    Colon cancer Father    Breast cancer Sister    Prostate cancer Brother    Cystic fibrosis Sister    Heart disease Sister    Pancreatic cancer Neg Hx    Rectal cancer Neg Hx    Stomach cancer Neg Hx    Allergies[2]  OBJECTIVE: Blood pressure (!) 101/49, pulse (!) 57, temperature (!) 97.2 F (36.2 C), resp. rate 13, height 5' 3 (1.6 m), weight 101.1 kg, SpO2 100%.  Physical Exam Constitutional:      Appearance: She is obese.     Interventions: She is intubated.  HENT:     Head: Normocephalic and atraumatic.  Cardiovascular:  Rate and Rhythm: Normal rate and regular rhythm.  Pulmonary:     Effort: No respiratory distress. She is intubated.  Abdominal:     General: There is no distension.  Skin:    General: Skin is warm and dry.  Neurological:     General: No focal deficit present.     Lab Results Lab Results  Component Value Date   WBC 20.6 (H) 06/02/2024   HGB 7.8 (L) 06/02/2024   HCT 25.6 (L) 06/02/2024   MCV 85.3 06/02/2024    PLT 341 06/02/2024    Lab Results  Component Value Date   CREATININE 1.03 (H) 06/02/2024   BUN 54 (H) 06/02/2024   NA 145 06/02/2024   K 4.1 06/02/2024   CL 112 (H) 06/02/2024   CO2 23 06/02/2024    Lab Results  Component Value Date   ALT 12 06/02/2024   AST 32 06/02/2024   ALKPHOS 126 06/02/2024   BILITOT 0.6 06/02/2024     Microbiology: Recent Results (from the past 240 hours)  Blood Culture (routine x 2)     Status: None (Preliminary result)   Collection Time: 06/01/24 11:40 AM   Specimen: BLOOD LEFT FOREARM  Result Value Ref Range Status   Specimen Description   Final    BLOOD LEFT FOREARM Performed at [facility], [address] [place], [place] [address]    Special Requests   Final    BOTTLES DRAWN AEROBIC AND ANAEROBIC Blood Culture results may not be optimal due to an inadequate volume of blood received in culture bottles Performed at [facility], [address] [place], [place] [address]    Culture   Final    NO GROWTH < 12 HOURS Performed at [facility], [address] [place], [place] [address]    Report Status PENDING  Incomplete  Blood Culture (routine x 2)     Status: None (Preliminary result)   Collection Time: 06/01/24 11:41 AM   Specimen: BLOOD RIGHT ARM  Result Value Ref Range Status   Specimen Description   Final    BLOOD RIGHT ARM Performed at [facility], [address], [place], [place] [address]    Special Requests   Final    BOTTLES DRAWN AEROBIC ONLY Blood Culture results may not be optimal due to an inadequate volume of blood received in culture bottles Performed at [facility], [address] [place], [place] [address]    Culture   Final    NO GROWTH < 12 HOURS Performed at [facility], [address], [place], [place] [address]    Report Status PENDING  Incomplete  Aerobic/Anaerobic Culture w Gram Stain (surgical/deep wound)     Status: None (Preliminary result)    Collection Time: 06/01/24  5:38 PM   Specimen: Soft Tissue, Other  Result Value Ref Range Status   Specimen Description   Final    TISSUE Performed at [facility], [address] [place], [place] [address]    Special Requests SACRAL WD  Final   Gram Stain   Final    NO WBC SEEN ABUNDANT GRAM NEGATIVE RODS FEW GRAM POSITIVE COCCI    Culture   Final    TOO YOUNG TO READ Performed at [facility], [address], [place], [place] [address]    Report Status PENDING  Incomplete  MRSA Next Gen by PCR, Nasal     Status: None  Collection Time: 06/01/24  6:43 PM   Specimen: Nasal Mucosa; Nasal Swab  Result Value Ref Range Status   MRSA by PCR Next Gen NOT DETECTED NOT DETECTED Final    Comment: (NOTE) The GeneXpert MRSA Assay (FDA approved for NASAL specimens only), is one component of a comprehensive MRSA colonization surveillance program. It is not intended to diagnose MRSA infection nor to guide or monitor treatment for MRSA infections. Test performance is not FDA approved in patients less than 18 years old. Performed at [facility], [address], [place], [place] [address]     Jomarie Fleeta Rothman, MD [facility] [phone] pager  06/02/2024, 4:02 PM\     [1]  Social History Tobacco Use   Smoking status: Never   Smokeless tobacco: Never  Vaping Use   Vaping status: Never Used  Substance Use Topics   Alcohol use: No    Alcohol/week: 0.0 standard drinks of alcohol   Drug use: No  [2]  Allergies Allergen Reactions   Lisinopril  Anaphylaxis, Swelling and Other (See Comments)    Swelling in the mouth and throat   Atorvastatin Other (See Comments)    No reaction stated on the [facility]   Gold Tribromide Other (See Comments)    ...is a key precursor for creating gold nanoparticles used in electronics and medicine.   Penicillins Rash   Ultram [Tramadol Hcl] Rash   "

## 2024-06-02 NOTE — Plan of Care (Signed)
 Patient unresponsive during the night shift even after decreasing sedation. Will continue to monitor.  Problem: Education: Goal: Knowledge of General Education information will improve Description: Including pain rating scale, medication(s)/side effects and non-pharmacologic comfort measures Outcome: Progressing   Problem: Health Behavior/Discharge Planning: Goal: Ability to manage health-related needs will improve Outcome: Progressing   Problem: Clinical Measurements: Goal: Ability to maintain clinical measurements within normal limits will improve Outcome: Progressing Goal: Will remain free from infection Outcome: Progressing Goal: Diagnostic test results will improve Outcome: Progressing Goal: Respiratory complications will improve Outcome: Progressing Goal: Cardiovascular complication will be avoided Outcome: Progressing   Problem: Activity: Goal: Risk for activity intolerance will decrease Outcome: Progressing   Problem: Nutrition: Goal: Adequate nutrition will be maintained Outcome: Progressing   Problem: Coping: Goal: Level of anxiety will decrease Outcome: Progressing   Problem: Elimination: Goal: Will not experience complications related to bowel motility Outcome: Progressing Goal: Will not experience complications related to urinary retention Outcome: Progressing   Problem: Pain Managment: Goal: General experience of comfort will improve and/or be controlled Outcome: Progressing   Problem: Safety: Goal: Ability to remain free from injury will improve Outcome: Progressing   Problem: Skin Integrity: Goal: Risk for impaired skin integrity will decrease Outcome: Progressing   Problem: Activity: Goal: Ability to tolerate increased activity will improve Outcome: Progressing   Problem: Respiratory: Goal: Ability to maintain a clear airway and adequate ventilation will improve Outcome: Progressing   Problem: Role Relationship: Goal: Method of  communication will improve Outcome: Progressing

## 2024-06-02 NOTE — Progress Notes (Signed)
 Initial Nutrition Assessment  DOCUMENTATION CODES:   Obesity unspecified  INTERVENTION:  - Initiate tube feeding via OGT: Vital High Protein at 65mL/hr - run until midnight tonight   - NPO for OR tomorrow.   - Once able to restart tube feeds, recommend: Pivot 1.5 at 45 ml/h (1080 ml per day) *Would recommend starting at 23mL/hr and advancing by 10mL Q12H Prosource TF20 60 ml BID At goal provides 1780 kcal, 141 gm protein, 810 ml free water  daily + 1 packet Juven BID, each packet provides 95 calories, 2.5 grams of protein (collagen), and 9.8 grams of carbohydrate (3 grams sugar); also contains 7 grams of L-arginine and L-glutamine, 300 mg vitamin C, 15 mg vitamin E, 1.2 mcg vitamin B-12, 9.5 mg zinc to support wound healing  **TF at goal + Juven BID provides 1970 kcals and 146g protein.  - Recommend multivitamin with minerals daily, 500mg  vitamin C daily, and 220mg  zinc daily x14 days.   NUTRITION DIAGNOSIS:   Increased nutrient needs related to wound healing as evidenced by estimated needs.  GOAL:   Patient will meet greater than or equal to 90% of their needs  MONITOR:   Vent status, Labs, Weight trends, TF tolerance  REASON FOR ASSESSMENT:   Consult Assessment of nutrition requirement/status, Enteral/tube feeding initiation and management  ASSESSMENT:   79 y.o. female w/ PMH significant for HTN, dementia, HLD, diastolic dysfunction, and a recent hospitalization in January from a fall w/ subsequent rhabdo who presented with fever and necrotizing sacral wound.   2/3 Admit; s/p excisional debridement of sacral wound - skin, subcutaneous tissue, fascia and some muscle removed  Patient is currently intubated on ventilator support MV: 6.8 L/min Temp (24hrs), Avg:96.7 F (35.9 C), Min:96.3 F (35.7 C), Max:97.3 F (36.3 C)  No family at bedside at time of visit.   Per chart review, no weight history over the past year to assess recent trends. Suspect weight in  January copied over from 2022.   Patient is s/p debridement of sacral wound yesterday. Surgery planning to return to the OR tomorrow.   Discussed with CCM, can initiate trickle tube feeds today and then hold at midnight for surgery tomorrow. Surgery and Hospitalist on board for plan.   Goal tube feed recs above once able to advance tube feeds.    Medications reviewed and include: Q4H FWF, Miralax , Senokot Fentanyl  Levophed  @ 2 mcg/min Propofol  @ 10.10mL/hr (provides 287 kcals over 24 hours)  Labs reviewed:  Creatinine 1.03  NUTRITION - FOCUSED PHYSICAL EXAM:  Flowsheet Row Most Recent Value  Orbital Region Moderate depletion  Upper Arm Region No depletion  Thoracic and Lumbar Region No depletion  Buccal Region Unable to assess  Temple Region Moderate depletion  Clavicle Bone Region Moderate depletion  Clavicle and Acromion Bone Region Mild depletion  Scapular Bone Region Unable to assess  Dorsal Hand No depletion  Patellar Region No depletion  Anterior Thigh Region No depletion  Posterior Calf Region No depletion  Edema (RD Assessment) Mild  Hair Reviewed  Eyes Unable to assess  Mouth Unable to assess  Skin Reviewed  Nails Reviewed    Diet Order:   Diet Order             Diet NPO time specified  Diet effective now                   EDUCATION NEEDS:  Not appropriate for education at this time  Skin:  Skin Assessment: Skin Integrity Issues:  Skin Integrity Issues:: Incisions Incisions: Coccyx  Last BM:  PTA - rectal tube in place  Height:  Ht Readings from Last 1 Encounters:  06/02/24 5' 3 (1.6 m)   Weight:  Wt Readings from Last 1 Encounters:  06/02/24 101.1 kg   Ideal Body Weight:  52.27 kg  BMI:  Body mass index is 39.48 kg/m.  Estimated Nutritional Needs:  Kcal:  1850-2100 kcals Protein:  125-145 grams Fluid:  >/= 1.8L    Trude Ned RD, LDN Contact via Secure Chat.

## 2024-06-02 NOTE — Progress Notes (Addendum)
 Spoke with son, Mr. Dilley, at the bedside.   He states that his mother was diagnosed with dementia about 3 years ago; At baseline she is oriented to person, birthday, family members, and location. Was having some sundowning and irritability in the evenings when she was living at home. At home she was getting [facility] PT and needed some assistance with mobility. Since her hospitalization last month, followed by discharge to [facility], she has ultimately been bedbound.   Discussed return to the OR tomorrow for wound check, possible debridement, followed by diverting loop colostomy.  Discussed risks/benefits. Questions welcomed and answered.  Written consent form filed out and signed at the bedside.   Tentative plan for OR tomorrow at 0900. TF held at [facility]. Will consult WOC for ostomy marking.   Denise Pringle, PA-C [facility] Please see Amion for pager number during day hours 7:00am-4:30pm

## 2024-06-03 DIAGNOSIS — J9601 Acute respiratory failure with hypoxia: Secondary | ICD-10-CM

## 2024-06-03 DIAGNOSIS — A419 Sepsis, unspecified organism: Secondary | ICD-10-CM | POA: Diagnosis not present

## 2024-06-03 DIAGNOSIS — D72829 Elevated white blood cell count, unspecified: Secondary | ICD-10-CM

## 2024-06-03 LAB — CBC WITH DIFFERENTIAL/PLATELET
Abs Immature Granulocytes: 0.93 10*3/uL — ABNORMAL HIGH (ref 0.00–0.07)
Basophils Absolute: 0 10*3/uL (ref 0.0–0.1)
Basophils Relative: 0 %
Eosinophils Absolute: 0.1 10*3/uL (ref 0.0–0.5)
Eosinophils Relative: 0 %
HCT: 18.7 % — ABNORMAL LOW (ref 36.0–46.0)
Hemoglobin: 5.7 g/dL — CL (ref 12.0–15.0)
Immature Granulocytes: 5 %
Lymphocytes Relative: 13 %
Lymphs Abs: 2.4 10*3/uL (ref 0.7–4.0)
MCH: 25.8 pg — ABNORMAL LOW (ref 26.0–34.0)
MCHC: 30.5 g/dL (ref 30.0–36.0)
MCV: 84.6 fL (ref 80.0–100.0)
Monocytes Absolute: 1 10*3/uL (ref 0.1–1.0)
Monocytes Relative: 6 %
Neutro Abs: 13.5 10*3/uL — ABNORMAL HIGH (ref 1.7–7.7)
Neutrophils Relative %: 76 %
Platelets: 259 10*3/uL (ref 150–400)
RBC: 2.21 MIL/uL — ABNORMAL LOW (ref 3.87–5.11)
RDW: 17.2 % — ABNORMAL HIGH (ref 11.5–15.5)
Smear Review: NORMAL
WBC: 17.8 10*3/uL — ABNORMAL HIGH (ref 4.0–10.5)
nRBC: 0.1 % (ref 0.0–0.2)

## 2024-06-03 LAB — GLUCOSE, CAPILLARY
Glucose-Capillary: 103 mg/dL — ABNORMAL HIGH (ref 70–99)
Glucose-Capillary: 104 mg/dL — ABNORMAL HIGH (ref 70–99)
Glucose-Capillary: 112 mg/dL — ABNORMAL HIGH (ref 70–99)
Glucose-Capillary: 123 mg/dL — ABNORMAL HIGH (ref 70–99)
Glucose-Capillary: 129 mg/dL — ABNORMAL HIGH (ref 70–99)
Glucose-Capillary: 98 mg/dL (ref 70–99)

## 2024-06-03 LAB — COMPREHENSIVE METABOLIC PANEL WITH GFR
ALT: 8 U/L (ref 0–44)
AST: 19 U/L (ref 15–41)
Albumin: 2.2 g/dL — ABNORMAL LOW (ref 3.5–5.0)
Alkaline Phosphatase: 101 U/L (ref 38–126)
Anion gap: 11 (ref 5–15)
BUN: 45 mg/dL — ABNORMAL HIGH (ref 8–23)
CO2: 21 mmol/L — ABNORMAL LOW (ref 22–32)
Calcium: 8 mg/dL — ABNORMAL LOW (ref 8.9–10.3)
Chloride: 108 mmol/L (ref 98–111)
Creatinine, Ser: 0.93 mg/dL (ref 0.44–1.00)
GFR, Estimated: >60 mL/min
Glucose, Bld: 132 mg/dL — ABNORMAL HIGH (ref 70–99)
Potassium: 3.8 mmol/L (ref 3.5–5.1)
Sodium: 141 mmol/L (ref 135–145)
Total Bilirubin: 0.6 mg/dL (ref 0.0–1.2)
Total Protein: 5.4 g/dL — ABNORMAL LOW (ref 6.5–8.1)

## 2024-06-03 LAB — PREPARE RBC (CROSSMATCH)

## 2024-06-03 LAB — HEMOGLOBIN AND HEMATOCRIT, BLOOD
HCT: 29.8 % — ABNORMAL LOW (ref 36.0–46.0)
Hemoglobin: 9.4 g/dL — ABNORMAL LOW (ref 12.0–15.0)

## 2024-06-03 MED ORDER — FENTANYL CITRATE (PF) 100 MCG/2ML IJ SOLN
INTRAMUSCULAR | Status: AC
  Filled 2024-06-03: qty 2

## 2024-06-03 MED ORDER — ROCURONIUM BROMIDE 10 MG/ML (PF) SYRINGE
PREFILLED_SYRINGE | INTRAVENOUS | Status: AC
  Filled 2024-06-03: qty 10

## 2024-06-03 MED ORDER — SODIUM CHLORIDE 0.9 % IV SOLN
10.0000 mL/h | Freq: Once | INTRAVENOUS | Status: AC
  Administered 2024-06-03: 10 mL/h via INTRAVENOUS

## 2024-06-03 MED ORDER — VITAL HP 1.0 CAL PO LIQD
1000.0000 mL | ORAL | Status: AC
  Administered 2024-06-03: 1000 mL

## 2024-06-03 MED ORDER — SODIUM CHLORIDE 0.9% IV SOLUTION: Freq: Once | INTRAVENOUS | Status: AC

## 2024-06-03 MED ORDER — ACETAMINOPHEN 160 MG/5ML PO SOLN: 650.0000 mg | Freq: Four times a day (QID) | ORAL | Status: AC | PRN

## 2024-06-03 MED ORDER — SODIUM CHLORIDE 0.9 % IV SOLN
2.0000 g | Freq: Two times a day (BID) | INTRAVENOUS | Status: DC
  Administered 2024-06-03 – 2024-06-04 (×2): 2 g via INTRAVENOUS
  Filled 2024-06-03 (×2): qty 12.5

## 2024-06-03 MED ORDER — LIDOCAINE HCL 2 % IJ SOLN
INTRAMUSCULAR | Status: AC
  Administered 2024-06-03: 400 mg
  Filled 2024-06-03: qty 20

## 2024-06-03 MED ORDER — BUPIVACAINE-EPINEPHRINE (PF) 0.25% -1:200000 IJ SOLN
INTRAMUSCULAR | Status: AC
  Filled 2024-06-03: qty 60

## 2024-06-03 NOTE — Plan of Care (Signed)
" °  Problem: Health Behavior/Discharge Planning: Goal: Ability to manage health-related needs will improve Outcome: Progressing   Problem: Clinical Measurements: Goal: Ability to maintain clinical measurements within normal limits will improve Outcome: Progressing Goal: Cardiovascular complication will be avoided Outcome: Progressing   Problem: Activity: Goal: Risk for activity intolerance will decrease Outcome: Progressing   Problem: Elimination: Goal: Will not experience complications related to bowel motility Outcome: Progressing   Problem: Pain Managment: Goal: General experience of comfort will improve and/or be controlled Outcome: Progressing   Problem: Skin Integrity: Goal: Risk for impaired skin integrity will decrease Outcome: Progressing   "

## 2024-06-03 NOTE — Progress Notes (Signed)
 Brief Nutrition Support Update Note   Surgery delayed until tomorrow due to patient's worsening anemia. Discussed with CCM, can restart trickles today and end at midnight for surgery tomorrow.  INTERVENTION:  - Initiate tube feeding via OGT: Vital High Protein at 34mL/hr - run until midnight tonight    - NPO for OR tomorrow.    - Once able to restart tube feeds, recommend: Pivot 1.5 at 45 ml/h (1080 ml per day) *Would recommend starting at 56mL/hr and advancing by 10mL Q12H Prosource TF20 60 ml BID At goal provides 1780 kcal, 141 gm protein, 810 ml free water  daily + 1 packet Juven BID, each packet provides 95 calories, 2.5 grams of protein (collagen), and 9.8 grams of carbohydrate (3 grams sugar); also contains 7 grams of L-arginine and L-glutamine, 300 mg vitamin C, 15 mg vitamin E, 1.2 mcg vitamin B-12, 9.5 mg zinc to support wound healing   **TF at goal + Juven BID provides 1970 kcals and 146g protein.   - Recommend multivitamin with minerals daily, 500mg  vitamin C daily, and 220mg  zinc daily x14 days.  Trude Ned RD, LDN Contact via [employer].

## 2024-06-03 NOTE — Progress Notes (Signed)
 PHARMACY NOTE:  ANTIMICROBIAL RENAL DOSAGE ADJUSTMENT  Current antimicrobial regimen includes a mismatch between antimicrobial dosage and estimated renal function.  As per policy approved by the Pharmacy & Therapeutics and Medical Executive Committees, the antimicrobial dosage will be adjusted accordingly.  Current antimicrobial dosage:  Cefepime  2 gm q8hrs  Indication: Necrotizing SSTI   Renal Function:  Estimated Creatinine Clearance: 53.5 mL/min (A) (by C-G formula based on SCr of 1.03 mg/dL (H)).      Antimicrobial dosage has been changed to:  Cefepime  2 gm q12 hours    Thank you for allowing pharmacy to be a part of this patient's care.  Rosaline IVAR Edison, Pharm.D Use secure chat for questions 06/03/2024 7:29 AM

## 2024-06-03 NOTE — Consult Note (Addendum)
" °  CLINICAL SUPPORT TEAM - WOUND OSTOMY AND CONTINENCE TEAM  CONSULTATION SERVICES   WOC Nurse-Inpatient Note   WOC Nurse requested for preoperative stoma site marking  Presurgical stoma site marking performed Pt and is intubated and sedated.  No family members present.  Examined patient lying in bed; they are critically ill, in order to place the marking in the patient's visual field, away from any creases or abdominal contour issues and within the rectus muscle.  Attempted to mark below the patient's belt line, but this was not possible, since a significant crease occurs lower on the abd which should be avoided if possible.    Marked for colostomy in the LLQ  _7___ cm to the left of the umbilicus and __6__cm above the umbilicus.  Marked for ileostomy in the RLQ  __7__cm to the right of the umbilicus and  __6__ cm above the umbilicus.  Patient's abdomen cleansed with CHG wipes at site markings, allowed to air dry prior to marking. Pt plans for surgery this morning, so Tegaderm was not applied.  WOC team will begin teaching sessions tomorrow if they receive an ostomy during surgery today.    Thank-you,  Stephane Fought MSN, RN, CWOCN, CWCN-AP, CNS Contact Mon-Fri 0700-1500: [phone]  "

## 2024-06-03 NOTE — Progress Notes (Cosign Needed)
 Arterial Catheter Insertion Procedure Note  [name]  [id number]  11/23/1945  Date:06/03/24  Time:6:22 PM    Provider Performing: Fonda JAYSON Forbes    Procedure: Insertion of Arterial Line (63379) with US  guidance (23062)   Indication(s) Blood pressure monitoring and/or need for frequent ABGs  Consent Risks of the procedure as well as the alternatives and risks of each were explained to the patient and/or caregiver.  Consent for the procedure was obtained and is signed in the bedside chart  Anesthesia None   Time Out Verified patient identification, verified procedure, site/side was marked, verified correct patient position, special equipment/implants available, medications/allergies/relevant history reviewed, required imaging and test results available.   Sterile Technique Maximal sterile technique including full sterile barrier drape, hand hygiene, sterile gown, sterile gloves, mask, hair covering, sterile ultrasound probe cover (if used).   Procedure Description Area of catheter insertion was cleaned with chlorhexidine  and draped in sterile fashion. With real-time ultrasound guidance an arterial catheter was placed into the right radial artery.  Appropriate arterial tracings confirmed on monitor.     Complications/Tolerance None; patient tolerated the procedure well.   EBL Minimal   Specimen(s) None   [name] AGACNP-BC   [facility] 06/03/2024, 6:22 PM  Please see [email or web address] for pager details.  From 7A-7P if no response, please call [phone]. After hours, please call ELink [phone].

## 2024-06-03 NOTE — Progress Notes (Signed)
 " PROGRESS NOTE    [name]  FMW:[id number] DOB: 10-27-1945 DOA: 06/01/2024 PCP: [facility] [address] [facility]   Chief Complaint: Fever, infected wound   HPI: [name] is a 79 y.o. female with medical history significant for dementia, diastolic dysfunction, GERD, hypertension, hyperlipidemia, recent hospitalization at [facility] in the middle of January due to a fall and rhabdomyolysis who is now being admitted to the hospital with a necrotizing sacral wound.  Patient is not able to provide any history, history is provided by her son who is at the bedside and visits her frequently at the facility to which she was discharged in January.  He thinks that she developed a sacral wound while she was at [facility], or at her nursing facility.  He states that they have been cleaning her and changing dressings on a daily basis.  Apparently today she had a fever at her facility, was sent from [facility] and rehab for evaluation.  Workup in the emergency department including CT as detailed below shows evidence of a deep necrotizing wound.  She has been seen in consultation by general surgery, who plans to take her to the OR urgently.     Subjective: Patient is sedated and intubated.  She will briefly open her eyes but really follow no commands at this point.  Hospital Course: 06/02/24: Taken to OR for extensive sacral debridement, remains on mech vent overnight, low dose levophed , plans for return to OR today   Assessment and Plan:  [name] is a 79 y.o. female with medical history significant for dementia, diastolic dysfunction, GERD, hypertension, hyperlipidemia, recent hospitalization at [facility] in the middle of January due to a fall and rhabdomyolysis who is now being admitted to the hospital with a necrotizing sacral wound.   Sepsis due to necrotizing sacral wound-meeting criteria with tachycardia, report of fever, leukocytosis.  Patient is hemodynamically stable, no evidence of endorgan  dysfunction. -Inpatient admission to ICU on vent -Empiric IV antibiotics IV Flagyl , linezolid , cefepime  -Follow-up blood cultures, surgical cultures -to OR today. On vent, plans to return to OR 06/03/24 for further debridement - sedation/pain- propofol  & fentanyl  - Patient is currently on 3 mics per minute of IV norepinephrine    Dementia-Aricept    Hypernatremia-likely due to significant dehydration in the setting of her sepsis. -Will continue LR infusion and monitor sodium daily   Chronic normocytic anemia-appears to be stable  GI prophy- protonix  IV   DVT prophylaxis: SCDs only due to impending surgery     Code Status: Full Code   Consults called: General Surgery     DVT prophylaxis: SCDs Start: 06/01/24 1524     Code Status: Full Code Family Communication: None available Disposition Plan: pending return OR, I suspect she will need SNF f after extensive debridement Reason for continuing need for hospitalization: surgery, f/u cultures, may need wound vac  Objective: Vitals:   06/03/24 1600 06/03/24 1635 06/03/24 1700 06/03/24 1710  BP: (!) 115/55  (!) 110/57   Pulse: 85 86 83 83  Resp: 19 17 18 16   Temp: 99.9 F (37.7 C) 100 F (37.8 C) 100 F (37.8 C) 100 F (37.8 C)  TempSrc:    Bladder  SpO2: 99% 99% 98% 98%  Weight:      Height:        Intake/Output Summary (Last 24 hours) at 06/03/2024 1824 Last data filed at 06/03/2024 1807 Gross per 24 hour  Intake 5996.17 ml  Output 1250 ml  Net 4746.17 ml  Filed Weights   06/01/24 2000 06/02/24 0451 06/03/24 0500  Weight: 97.3 kg 101.1 kg 106.6 kg    Examination:  Patient is sedated on the vent.  She will open eyes to verbal commands HEENT: Elevating up upper lids shows no scleral icterus, disconjugate gaze ET tube and OG tube in place Regular rate and rhythm S1-S2 no murmurs rubs gallops Lungs are clear in the anterior and lateral lung field Abdomen: Is soft, nontender nondistended bowel sounds are  present Extremities: No cyanosis clubbing or edema Foley catheter in place    Data Reviewed: I have personally reviewed following labs and imaging studies  CBC: Recent Labs  Lab 06/01/24 1106 06/01/24 2126 06/02/24 0135 06/02/24 1659 06/03/24 1039 06/03/24 1745  WBC 19.2* 20.3* 20.6* 26.5* 17.8*  --   NEUTROABS 15.1*  --   --  21.3* 13.5*  --   HGB 8.9* 8.4* 7.8* 7.1* 5.7* 9.4*  HCT 29.1* 27.5* 25.6* 23.1* 18.7* 29.8*  MCV 84.3 84.9 85.3 84.9 84.6  --   PLT 401* 342 341 372 259  --    Basic Metabolic Panel: Recent Labs  Lab 06/01/24 1106 06/01/24 2126 06/02/24 0135 06/03/24 1200  NA 149* 146* 145 141  K 3.7 4.4 4.1 3.8  CL 112* 112* 112* 108  CO2 26 20* 23 21*  GLUCOSE 128* 140* 188* 132*  BUN 56* 52* 54* 45*  CREATININE 1.02* 0.94 1.03* 0.93  CALCIUM 9.1 8.6* 8.4* 8.0*   GFR: Estimated Creatinine Clearance: 59.3 mL/min (by C-G formula based on SCr of 0.93 mg/dL). Liver Function Tests: Recent Labs  Lab 06/01/24 1106 06/01/24 2126 06/02/24 0135 06/03/24 1200  AST 69* 43* 32 19  ALT 17 13 12 8   ALKPHOS 170* 122 126 101  BILITOT 0.8 0.8 0.6 0.6  PROT 7.7 5.8* 5.5* 5.4*  ALBUMIN  2.5* 2.1* 2.1* 2.2*   No results for input(s): LIPASE, AMYLASE in the last 168 hours. No results for input(s): AMMONIA in the last 168 hours. Coagulation Profile: Recent Labs  Lab 06/01/24 1106 06/01/24 2126 06/02/24 1659  INR 1.7* 1.7* 1.4*   Cardiac Enzymes: No results for input(s): CKTOTAL, CKMB, CKMBINDEX, TROPONINI in the last 168 hours. ProBNP, BNP (last 5 results) No results for input(s): PROBNP, BNP in the last 8760 hours. HbA1C: No results for input(s): HGBA1C in the last 72 hours. CBG: Recent Labs  Lab 06/02/24 2327 06/03/24 0448 06/03/24 0748 06/03/24 1146 06/03/24 1535  GLUCAP 142* 123* 112* 129* 98   Lipid Profile: Recent Labs    06/02/24 0135  TRIG 218*   Thyroid  Function Tests: No results for input(s): TSH, T4TOTAL,  FREET4, T3FREE, THYROIDAB in the last 72 hours. Anemia Panel: No results for input(s): VITAMINB12, FOLATE, FERRITIN, TIBC, IRON, RETICCTPCT in the last 72 hours. Sepsis Labs: Recent Labs  Lab 06/01/24 2126 06/02/24 0135 06/02/24 1648 06/02/24 2119  LATICACIDVEN 1.5 1.4 1.5 1.8    Recent Results (from the past 240 hours)  Blood Culture (routine x 2)     Status: None (Preliminary result)   Collection Time: 06/01/24 11:40 AM   Specimen: BLOOD LEFT FOREARM  Result Value Ref Range Status   Specimen Description   Final    BLOOD LEFT FOREARM Performed at [facility], [address], [place], [place] [address]    Special Requests   Final    BOTTLES DRAWN AEROBIC AND ANAEROBIC Blood Culture results may not be optimal due to an inadequate volume of blood received in culture bottles Performed  at [facility], [address] [place], [place] [address]    Culture   Final    NO GROWTH 2 DAYS Performed at [facility], [address], [place], [place] [address]    Report Status PENDING  Incomplete  Blood Culture (routine x 2)     Status: None (Preliminary result)   Collection Time: 06/01/24 11:41 AM   Specimen: BLOOD RIGHT ARM  Result Value Ref Range Status   Specimen Description   Final    BLOOD RIGHT ARM Performed at [facility], [address] [place], [place] [address]    Special Requests   Final    BOTTLES DRAWN AEROBIC ONLY Blood Culture results may not be optimal due to an inadequate volume of blood received in culture bottles Performed at [facility], [address], [place], [place] [address]    Culture   Final    NO GROWTH 2 DAYS Performed at [facility], [address], [place], [place] [address]    Report Status PENDING  Incomplete  Aerobic/Anaerobic Culture w Gram Stain (surgical/deep wound)     Status: None (Preliminary result)   Collection Time: 06/01/24  5:38 PM   Specimen: Soft  Tissue, Other  Result Value Ref Range Status   Specimen Description   Final    TISSUE Performed at [facility], [address] [place], [place] [address]    Special Requests SACRAL WD  Final   Gram Stain   Final    NO WBC SEEN ABUNDANT GRAM NEGATIVE RODS FEW GRAM POSITIVE COCCI Performed at [facility], [address], [place], [place] [address]    Culture   Final    MODERATE PROTEUS MIRABILIS SUSCEPTIBILITIES TO FOLLOW MIXED ANAEROBIC FLORA PRESENT.  CALL LAB IF FURTHER IID REQUIRED.    Report Status PENDING  Incomplete  MRSA Next Gen by PCR, Nasal     Status: None   Collection Time: 06/01/24  6:43 PM   Specimen: Nasal Mucosa; Nasal Swab  Result Value Ref Range Status   MRSA by PCR Next Gen NOT DETECTED NOT DETECTED Final    Comment: (NOTE) The GeneXpert MRSA Assay (FDA approved for NASAL specimens only), is one component of a comprehensive MRSA colonization surveillance program. It is not intended to diagnose MRSA infection nor to guide or monitor treatment for MRSA infections. Test performance is not FDA approved in patients less than 28 years old. Performed at [facility], [address], [place], [place] [address]   Culture, blood (Routine x 2)     Status: None (Preliminary result)   Collection Time: 06/02/24  4:59 PM   Specimen: BLOOD LEFT HAND  Result Value Ref Range Status   Specimen Description   Final    BLOOD LEFT HAND Performed at [facility], [address], [place], [place] [address]    Special Requests   Final    BOTTLES DRAWN AEROBIC AND ANAEROBIC Blood Culture adequate volume Performed at [facility], [address], [place], [place] [address]    Culture   Final    NO GROWTH < 12 HOURS Performed at [facility], [address], [place], [place] [address]    Report Status PENDING  Incomplete  Culture, blood (Routine x 2)     Status: None (Preliminary result)   Collection  Time: 06/02/24  5:15 PM   Specimen: BLOOD LEFT HAND  Result Value Ref Range Status   Specimen Description   Final    BLOOD LEFT HAND Performed at [facility], [address], [place], [place] [address]    Special Requests   Final    BOTTLES DRAWN AEROBIC AND ANAEROBIC Blood Culture adequate volume Performed at [facility], [address] [place], [place] [address]    Culture   Final    NO GROWTH < 12 HOURS Performed at [facility], [address] [place], [place] [address]    Report Status PENDING  Incomplete     Radiology Studies: DG Abd 1 View Result Date: 06/01/2024 EXAM: 1 VIEW XRAY OF THE ABDOMEN 06/01/2024 06:56:00 PM COMPARISON: None available. CLINICAL HISTORY: Encounter for imaging study to confirm nasogastric tube placement. FINDINGS: LINES, TUBES AND DEVICES: Enteric tube in place with tip and side port overlying the expected region of the gastric lumen. BOWEL: Nonobstructive bowel gas pattern. SOFT TISSUES: No abnormal calcifications. BONES: Thoracolumbar spine surgical hardware noted. IMPRESSION: 1. Enteric tube in appropriate position with tip and side port overlying the expected region of the gastric lumen. Electronically signed by: Norman Gatlin MD 06/01/2024 07:06 PM EST RP Workstation: [id number]   DG Chest Port 1 View Result Date: 06/01/2024 EXAM: 1 VIEW(S) XRAY OF THE CHEST 06/01/2024 06:55:00 PM COMPARISON: 06/01/2024 CLINICAL HISTORY: Endotracheally intubated. FINDINGS: LINES, TUBES AND DEVICES: Endotracheal tube in place with tip 4.8 cm above the carina. Enteric tube in place with tip and side port in the stomach. LUNGS AND PLEURA: No focal pulmonary opacity. No pleural effusion. No pneumothorax. HEART AND MEDIASTINUM: Aortic arch calcifications. No acute abnormality of the cardiac and mediastinal silhouettes. BONES AND SOFT TISSUES: Thoracolumbar fusion hardware noted. ACDF changes in lower cervical spine. No acute osseous abnormality.  IMPRESSION: 1. Endotracheal tube tip 4.8 cm above the carina and enteric tube tip and side port in the stomach. Electronically signed by: Norman Gatlin MD 06/01/2024 07:05 PM EST RP Workstation: [id number]    Scheduled Meds:  Chlorhexidine  Gluconate Cloth  6 each Topical Daily   donepezil   5 mg Per Tube QHS   free water   200 mL Per Tube Q4H   mouth rinse  15 mL Mouth Rinse Q2H   pantoprazole  (PROTONIX ) IV  40 mg Intravenous Q24H   polyethylene glycol  17 g Per Tube Daily   senna  1 tablet Per Tube BID   Continuous Infusions:  ceFEPime  (MAXIPIME ) IV Stopped (06/03/24 1620)   feeding supplement (VITAL HIGH PROTEIN) 1,000 mL (06/03/24 1549)   fentaNYL  infusion INTRAVENOUS 25 mcg/hr (06/03/24 1341)   linezolid  (ZYVOX ) IV Stopped (06/03/24 1117)   metronidazole  Stopped (06/03/24 1219)   norepinephrine  (LEVOPHED ) Adult infusion 1 mcg/min (06/03/24 1341)   propofol  (DIPRIVAN ) infusion 10 mcg/kg/min (06/03/24 1341)     LOS: 2 days   Time spent: 50 minutes  Lonni KANDICE Moose, MD  [facility]  06/03/2024, 6:24 PM   "

## 2024-06-03 NOTE — Anesthesia Preprocedure Evaluation (Addendum)
"                                    Anesthesia Evaluation  Patient identified by MRN, date of birth, ID band Patient unresponsive  General Assessment Comment: Intubated and Sedated on Propofol      Reviewed: Allergy & Precautions, Patient's Chart, lab work & pertinent test results, Unable to perform ROS - Chart review only  History of Anesthesia Complications Negative for: history of anesthetic complications  Airway Mallampati: Intubated       Dental  (+) Teeth Intact   Pulmonary   Acute Hypoxic Respiratory Failure (intubated 06/01/24 2/2 OR procedure - remains intubated for multiple debridements)      + decreased breath sounds      Cardiovascular hypertension,  Rhythm:Regular Rate:Normal     Neuro/Psych  PSYCHIATRIC DISORDERS     Dementia  Intubated and sedated on Propofol  GTT      GI/Hepatic ,GERD  Medicated and Controlled,,  Endo/Other    Renal/GU Renal InsufficiencyRenal disease Hypernatremia on admission 2/2 Sepsis - improving (Na 145 on 06/02/24)     Musculoskeletal  Necrotizing SSTI on broad-spectrum antibiotics   Hx of Rhabdomyolysis      Abdominal   Peds  Hematology  (+) Blood dyscrasia, anemia  Hgb 7.1, Plts 372K (06/02/24)  Type and Screen active - 1 unit PRBC ordered.    Anesthesia Other Findings   Reproductive/Obstetrics                              Anesthesia Physical Anesthesia Plan  ASA: 4  Anesthesia Plan: General   Post-op Pain Management: Dilaudid  IV and Ofirmev  IV (intra-op)*   Induction: Inhalational  PONV Risk Score and Plan: 2 and Ondansetron , Dexamethasone , Propofol  infusion and Treatment may vary due to age or medical condition  Airway Management Planned:   Additional Equipment:   Intra-op Plan:   Post-operative Plan: Post-operative intubation/ventilation  Informed Consent:      Consent reviewed with POA and History available from chart only  Plan Discussed with:  CRNA  Anesthesia Plan Comments: ( No AM labs drawn for patient - STAT CBC and CMP ordered. Hgb 5.7  - Discussed with surgical team. Plan to cancel today to further optimize patient. )         Anesthesia Quick Evaluation  "

## 2024-06-03 NOTE — Progress Notes (Signed)
" ° ° ° ° ° °  Patient's operative culture is growing a Proteus.  He probably does not need Zyvox  at this point but gram-negative and anaerobic coverage.  My understanding is going back to the operating room tomorrow I will leave things as they are tonight we will likely get rid of anti-MRSA coverage and need for ribosomal toxin in addition the Zyvox  is providing tomorrow.  [name] 06/03/2024, 2:53 PM  "

## 2024-06-03 NOTE — TOC Initial Note (Signed)
 Transition of Care [facility]) - Initial/Assessment Note    Patient Details  Name: [name] MRN: [id number] Date of Birth: 10/14/1945  Transition of Care [facility]) CM/SW Contact:    Jon ONEIDA Anon, RN Phone Number: 06/03/2024, 4:43 PM  Clinical Narrative:                  Pt is from [facility] for STR, confirmed with Admissions Coordinator Star.  Pt here for surgery on sacral necrotizing soft tissue infection. Pt intubated and on mechanical ventilation at this time. Pt not medically ready for discharge. If plan will be to return to SNF, pt will need PT/ OT evaluation and insurance authorization. ICM will continue to follow for DC planning needs.    Expected Discharge Plan:  (TBD) Barriers to Discharge: Continued Medical Work up (Pt on the ventilator)   Patient Goals and CMS Choice Patient states their goals for this hospitalization and ongoing recovery are:: Pt on the vent-UTA          Expected Discharge Plan and Services In-house Referral: NA Discharge Planning Services: CM Consult   Living arrangements for the past 2 months: Apartment, Skilled Nursing Facility                                      Prior Living Arrangements/Services Living arrangements for the past 2 months: Apartment, Skilled Nursing Facility   Patient language and need for interpreter reviewed:: Yes        Need for Family Participation in Patient Care: Yes (Comment) Care giver support system in place?: Yes (comment)   Criminal Activity/Legal Involvement Pertinent to Current Situation/Hospitalization: No - Comment as needed  Activities of Daily Living   ADL Screening (condition at time of admission) Independently performs ADLs?: No Does the patient have a NEW difficulty with bathing/dressing/toileting/self-feeding that is expected to last >3 days?: No Does the patient have a NEW difficulty with getting in/out of bed, walking, or climbing stairs that is expected to last >3  days?: No Does the patient have a NEW difficulty with communication that is expected to last >3 days?: No Is the patient deaf or have difficulty hearing?: No Does the patient have difficulty seeing, even when wearing glasses/contacts?: No Does the patient have difficulty concentrating, remembering, or making decisions?: No  Permission Sought/Granted Permission sought to share information with : Family Supports    Share Information with NAME: Farzana, Koci, Emergency Contact  [phone]           Emotional Assessment Appearance:: Other (Comment Required Attitude/Demeanor/Rapport: Unable to Assess Affect (typically observed): Unable to Assess Orientation: :  (Pt on the ventilator, UTA) Alcohol / Substance Use: Not Applicable Psych Involvement: No (comment)  Admission diagnosis:  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] Sacral decubitus ulcer [L89.159] Patient Active Problem List   Diagnosis Date Noted   Sacral decubitus ulcer 06/01/2024   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   Acute right hip pain 05/06/2024   Fall at home 05/05/2024   Rhabdomyolysis 05/04/2024   CKD stage 3a, GFR 45-59 ml/min (HCC) 05/04/2024   Dementia (HCC) 05/04/2024   Elevated troponin 05/04/2024   Elevated transaminase level 05/04/2024   Neurogenic claudication due to lumbar spinal stenosis 09/30/2017    Class: Chronic   Fusion of spine of thoracolumbar region 09/30/2017   S/P partial thyroidectomy 08/23/2016   Spinal stenosis, lumbar region, with neurogenic claudication 01/23/2016    Class:  Chronic   Other secondary scoliosis, 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    Class: Chronic   Spondylolisthesis of 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   Diastolic dysfunction    Hypokalemia 03/08/2015   Bradycardia 03/08/2015   Syncope and collapse 03/02/2015   Pancreatic cyst 02/28/2015   HNP (herniated nucleus pulposus), cervical 07/20/2012    Class: Acute   Cervical spondylosis without myelopathy 07/20/2012    Class: Chronic    Venous (peripheral) insufficiency 06/08/2010   Asymptomatic varicose veins 06/07/2010   HYPERLIPIDEMIA 04/16/2010   Essential hypertension 04/16/2010   GERD 04/16/2010   Diaphragmatic hernia 04/16/2010   Osteoarthritis 04/16/2010   Dysphagia 04/16/2010   Atrophic vaginitis 03/31/2009   PCP:  [facility] [address] Pharmacy:   [facility] [place] [place] - [address] [place] [address] [place] [address] Phone: [phone] Fax: [phone]     Social Drivers of Health (SDOH) Social History: SDOH Screenings   Food Insecurity: No Food Insecurity (05/04/2024)  Housing: Low Risk (05/04/2024)  Transportation Needs: Unknown (05/04/2024)  Utilities: Not At Risk (05/04/2024)  Social Connections: Moderately Integrated (05/04/2024)  Tobacco Use: Low Risk (06/01/2024)   SDOH Interventions:     Readmission Risk Interventions    06/03/2024    4:40 PM  Readmission Risk Prevention Plan  Transportation Screening Complete  PCP or Specialist Appt within 3-5 Days Complete  HRI or Home Care Consult Complete  Social Work Consult for Recovery Care Planning/Counseling Complete  Palliative Care Screening Not Applicable  Medication Review Oceanographer) Complete

## 2024-06-03 NOTE — Progress Notes (Addendum)
 "  NAME:  [name], MRN:  [id number], DOB:  21-Dec-1945, LOS: 2 ADMISSION DATE:  06/01/2024, CONSULTATION DATE:  06/01/2024 REFERRING MD:  Lyndel Mt MD CHIEF COMPLAINT:  Necrotizing Facientis    History of Present Illness:  [name] is a 79 y.o. female w/ PMH significant for HTN, dementia, HLD, diastolic dysfunction, and a recent hospitalization in January from a fall w/ subsequent rhabdo. Patient presented with fever and necrotizing sacral wound. Surgery consulted and taken immediately to OR for debridement.  Consulted post-op for continuation of intubation & sedation post op w/ tentative plans to return to OR 2/4.   HPI poor historian d/t intubation with hx obtain via chart review.  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   Pneumonia    years ago     Significant Hospital Events: Including procedures, antibiotic start and stop dates in addition to other pertinent events   2/3 Admitted to ICU intubated/vented s/p OR debridement 2/5 Plan to take back to OR for wound check, with additional debridement   Interim History / Subjective:   Intubated, sedated   Very weak, deconditioned   No significant events overnight  Objective   Blood pressure (!) 115/49, pulse 60, temperature 98.4 F (36.9 C), resp. rate 13, height 5' 3 (1.6 m), weight 106.6 kg, SpO2 100%.    Vent Mode: PRVC FiO2 (%):  [30 %-35 %] 30 % Set Rate:  [14 bmp] 14 bmp Vt Set:  [410 mL] 410 mL PEEP:  [5 cmH20] 5 cmH20 Plateau Pressure:  [8 cmH20-10 cmH20] 10 cmH20   Intake/Output Summary (Last 24 hours) at 06/03/2024 9095 Last data filed at 06/03/2024 9290 Gross per 24 hour  Intake  6495.58 ml  Output 950 ml  Net 5545.58 ml   Filed Weights   06/01/24 2000 06/02/24 0451 06/03/24 0500  Weight: 97.3 kg 101.1 kg 106.6 kg    Examination: General: acute on chronic older adult female, lying in icu bed on vent in NAD HEENT: Normocephalic, PERRLA intact, ETT, OG, Pink MM CV: s1,s2, RRR, no MRG, No JVD  pulm: clear, diminished, no distress on vent Abs: bs active, soft  Extremities: no edema, no deformity, intermittently moves to command  Skin: sacral wound post-op     Neuro: Rass -1, responds to painful stimuli, cough gag reflex present  GU: foley intact GI: flexiseal in place   Resolved Hospital Problem list   Mildly elevated LFTs  Assessment & Plan:   Sepsis 2/2 to nec fasc from sacral wound s/p debridement on 2/3 NGTD on blood cultures  Abundant GNR, few GPC on Aerobic/Anaerobic Cx from surgical wound Leukocytosis- 26.5 < 20.6  - Neut count 21.3 on 2/4  P:  ID consulted on 2/4- appreciate assistance, continue abx cefepime  and flagyl , vanc dc- transitioned to linzolid  Continue to follow culture data  Tentative plan is to go back to OR later today on 2/5 Continue to wean pressors as tolerated, on LR at 125 ml/hr, reassess later after post-op, reeval fluid volume status  Possible colostomy placement for bowel diversion to protect sacral wound, continue  flexiseal for now  Continue to trend WBC per CBC, along with fever curve  Wound care following appreciate assistance  Morning labs pending, f/u with   Hypotension in setting of above Hx of HTN- not on any antihypertensives outpt?  P: Continue to wean levophed  as tolerated  Continue to LR fluids for now  May need additional fluid resuscitation, due to ongoing wound drainage    Post op vent management P: Continue ventilator support and lung protective strategies  Continue LTVV  Wean PEEP and Fio2 requirements to sat goal of >92%  HOB > 30 degrees Plat < 30  Aim for Driving pressures < 15   Intermittent Chest X-ray and ABGS Continue follow cultures, abx as above  VAP and PAD protocols in place  Wean sedation as tolerated, SBT and WUA daily     Hypernatremia: likely hypovolemia 2/2 to sepsis; continuing to improve CKD IIIa P: Continue to trend renal function daily  Continue to monitor and optimize electrolytes daily Continue to monitor urine output Continue strict I/Os Continue Adequate renal perfusion  Avoid nephrotoxic agents  Continue free water   Follow up with morning labs   Dementia P: Continue home aricept    Anemia of Chronic Disease Hgb 7.1 from 7.8  P: Continue to monitor daily  Continue to trend, monitor signs of bleeding  Transfuse for hgb < 7, may need a unit of PRBCs later today on 2/5     Critical care time: The patient is critically ill with multiple organ system failure and requires high complexity decision making for assessment and support, frequent evaluation and titration of therapies, advanced monitoring, review of radiographic studies and interpretation of complex data.    Critical Care Time devoted to patient care services, exclusive of separately billable procedures, described in this note is 40 mins   CC: 40 mins   Christian Kosta Schnitzler AGACNP-BC   [facility] 06/03/2024, 9:26 AM  Please see [email or web address] for pager details.  From 7A-7P if no response, please call [phone]. After hours, please call ELink [phone].   Please see [email or web address] for pager details          "

## 2024-06-03 NOTE — Progress Notes (Signed)
" °  Progress Note   Date: 06/03/2024  Patient Name: [name]        MRN#: [id number]  Clarification of diagnosis: Patient was hypernatremic.  I suspect that hyponatremia was a dragon transcription error.     "

## 2024-06-03 NOTE — Progress Notes (Signed)
 Progress Note: General Surgery Service   Chief Complaint/Subjective: Intubated, sedated  Objective: Vital signs in last 24 hours: Temp:  [96.8 F (36 C)-99.3 F (37.4 C)] 99.3 F (37.4 C) (02/05 1225) Pulse Rate:  [53-84] 79 (02/05 1225) Resp:  [12-22] 17 (02/05 1225) BP: (90-135)/(39-90) 113/64 (02/05 1225) SpO2:  [83 %-100 %] 100 % (02/05 1225) FiO2 (%):  [30 %-35 %] 30 % (02/05 1225) Weight:  [106.6 kg] 106.6 kg (02/05 0500) Last BM Date : 06/02/24  Intake/Output from previous day: 02/04 0701 - 02/05 0700 In: 6023.3 [I.V.:2638.5; NG/GT:968; IV Piggyback:2416.8] Out: 950 [Urine:650; Emesis/NG output:200; Stool:100] Intake/Output this shift: Total I/O In: 1685.5 [I.V.:1292.5; IV Piggyback:393] Out: 100 [Urine:100]  Constitutional: NAD; conversant; no deformities Buttock - Rectal tube in place, wound dressed  Lab Results: CBC  Recent Labs    06/02/24 1659 06/03/24 1039  WBC 26.5* 17.8*  HGB 7.1* 5.7*  HCT 23.1* 18.7*  PLT 372 259   BMET Recent Labs    06/01/24 2126 06/02/24 0135  NA 146* 145  K 4.4 4.1  CL 112* 112*  CO2 20* 23  GLUCOSE 140* 188*  BUN 52* 54*  CREATININE 0.94 1.03*  CALCIUM 8.6* 8.4*   PT/INR Recent Labs    06/01/24 2126 06/02/24 1659  LABPROT 21.1* 17.6*  INR 1.7* 1.4*   ABG Recent Labs    06/01/24 2103  PHART 7.35  HCO3 25.4    Anti-infectives: Anti-infectives (From admission, onward)    Start     Dose/Rate Route Frequency Ordered Stop   06/03/24 1600  ceFEPIme  (MAXIPIME ) 2 g in sodium chloride  0.9 % 100 mL IVPB        2 g 200 mL/hr over 30 Minutes Intravenous Every 12 hours 06/03/24 0728     06/01/24 2200  metroNIDAZOLE  (FLAGYL ) IVPB 500 mg        500 mg 100 mL/hr over 60 Minutes Intravenous Every 12 hours 06/01/24 1530     06/01/24 2200  linezolid  (ZYVOX ) IVPB 600 mg        600 mg 300 mL/hr over 60 Minutes Intravenous Every 12 hours 06/01/24 1536     06/01/24 2000  ceFEPIme  (MAXIPIME ) 2 g in sodium chloride  0.9  % 100 mL IVPB  Status:  Discontinued        2 g 200 mL/hr over 30 Minutes Intravenous Every 8 hours 06/01/24 1544 06/03/24 0728   06/01/24 1445  clindamycin  (CLEOCIN ) IVPB 600 mg        600 mg 100 mL/hr over 30 Minutes Intravenous  Once 06/01/24 1441 06/01/24 1541   06/01/24 1145  vancomycin  (VANCOREADY) IVPB 2000 mg/400 mL        2,000 mg 200 mL/hr over 120 Minutes Intravenous  Once 06/01/24 1139 06/01/24 1435   06/01/24 1115  ceFEPIme  (MAXIPIME ) 2 g in sodium chloride  0.9 % 100 mL IVPB        2 g 200 mL/hr over 30 Minutes Intravenous  Once 06/01/24 1108 06/01/24 1225       Medications: Scheduled Meds:  Chlorhexidine  Gluconate Cloth  6 each Topical Daily   donepezil   5 mg Per Tube QHS   free water   200 mL Per Tube Q4H   mouth rinse  15 mL Mouth Rinse Q2H   pantoprazole  (PROTONIX ) IV  40 mg Intravenous Q24H   polyethylene glycol  17 g Per Tube Daily   senna  1 tablet Per Tube BID   Continuous Infusions:  ceFEPime  (MAXIPIME ) IV  fentaNYL  infusion INTRAVENOUS 25 mcg/hr (06/03/24 1117)   lactated ringers  125 mL/hr at 06/03/24 1117   linezolid  (ZYVOX ) IV Stopped (06/03/24 1118)   metronidazole  Stopped (06/03/24 1219)   norepinephrine  (LEVOPHED ) Adult infusion 3 mcg/min (06/03/24 1117)   propofol  (DIPRIVAN ) infusion 10 mcg/kg/min (06/03/24 1117)   PRN Meds:.acetaminophen  **OR** acetaminophen , albuterol , fentaNYL , HYDROmorphone  (DILAUDID ) injection, ondansetron  **OR** ondansetron  (ZOFRAN ) IV, mouth rinse  Assessment/Plan: [name] is a 79 year old female with a necrotizing soft tissue infection of the buttock and perianal area s/p excisional debridement on 06/01/24.  Wet to dry dressings Rectal tube Plan was for return to surgery today to re-evaluate wound and create colostomy to divert stool from wound.  Anemia worse this morning and receiving transfusion.  Will delay surgery till tomorrow.   LOS: 2 days    Denise JINNY Foy, MD  [facility] Use  [email or web address] to contact on call provider  Daily Billing: [phone] - post op

## 2024-06-04 ENCOUNTER — Inpatient Hospital Stay (HOSPITAL_COMMUNITY): Admitting: Certified Registered"

## 2024-06-04 ENCOUNTER — Encounter (HOSPITAL_COMMUNITY): Admission: EM | Payer: Self-pay | Source: Home / Self Care

## 2024-06-04 LAB — CBC WITH DIFFERENTIAL/PLATELET
Basophils Absolute: 0 10*3/uL (ref 0.0–0.1)
Basophils Relative: 0 %
Eosinophils Absolute: 0.1 10*3/uL (ref 0.0–0.5)
Eosinophils Relative: 1 %
HCT: 29 % — ABNORMAL LOW (ref 36.0–46.0)
Hemoglobin: 9.1 g/dL — ABNORMAL LOW (ref 12.0–15.0)
Lymphocytes Relative: 2 %
Lymphs Abs: 0.3 10*3/uL — ABNORMAL LOW (ref 0.7–4.0)
MCH: 26.9 pg (ref 26.0–34.0)
MCHC: 31.4 g/dL (ref 30.0–36.0)
MCV: 85.8 fL (ref 80.0–100.0)
Monocytes Absolute: 0.1 10*3/uL (ref 0.1–1.0)
Monocytes Relative: 1 %
Neutro Abs: 13.2 10*3/uL — ABNORMAL HIGH (ref 1.7–7.7)
Neutrophils Relative %: 96 %
Platelets: 286 10*3/uL (ref 150–400)
RBC: 3.38 MIL/uL — ABNORMAL LOW (ref 3.87–5.11)
RDW: 16.9 % — ABNORMAL HIGH (ref 11.5–15.5)
WBC: 13.8 10*3/uL — ABNORMAL HIGH (ref 4.0–10.5)
nRBC: 0 % (ref 0.0–0.2)

## 2024-06-04 LAB — COMPREHENSIVE METABOLIC PANEL WITH GFR
ALT: 7 U/L (ref 0–44)
AST: 15 U/L (ref 15–41)
Albumin: 2.3 g/dL — ABNORMAL LOW (ref 3.5–5.0)
Alkaline Phosphatase: 79 U/L (ref 38–126)
Anion gap: 11 (ref 5–15)
BUN: 37 mg/dL — ABNORMAL HIGH (ref 8–23)
CO2: 21 mmol/L — ABNORMAL LOW (ref 22–32)
Calcium: 8.1 mg/dL — ABNORMAL LOW (ref 8.9–10.3)
Chloride: 108 mmol/L (ref 98–111)
Creatinine, Ser: 0.89 mg/dL (ref 0.44–1.00)
GFR, Estimated: >60 mL/min
Glucose, Bld: 118 mg/dL — ABNORMAL HIGH (ref 70–99)
Potassium: 3.6 mmol/L (ref 3.5–5.1)
Sodium: 140 mmol/L (ref 135–145)
Total Bilirubin: 0.6 mg/dL (ref 0.0–1.2)
Total Protein: 5.4 g/dL — ABNORMAL LOW (ref 6.5–8.1)

## 2024-06-04 LAB — CULTURE, BLOOD (ROUTINE X 2)
Culture: NO GROWTH
Culture: NO GROWTH
Culture: NO GROWTH
Culture: NO GROWTH
Special Requests: ADEQUATE
Special Requests: ADEQUATE

## 2024-06-04 LAB — BPAM RBC
Blood Product Expiration Date: 202602282359
Blood Product Expiration Date: 202602282359
Blood Product Expiration Date: 202602282359
Blood Product Expiration Date: 202602282359
ISSUE DATE / TIME: 202602051204
ISSUE DATE / TIME: 202602051421
Unit Type and Rh: 6200
Unit Type and Rh: 6200
Unit Type and Rh: 6200
Unit Type and Rh: 6200

## 2024-06-04 LAB — AEROBIC/ANAEROBIC CULTURE W GRAM STAIN (SURGICAL/DEEP WOUND): Gram Stain: NONE SEEN

## 2024-06-04 LAB — GLUCOSE, CAPILLARY
Glucose-Capillary: 100 mg/dL — ABNORMAL HIGH (ref 70–99)
Glucose-Capillary: 100 mg/dL — ABNORMAL HIGH (ref 70–99)
Glucose-Capillary: 112 mg/dL — ABNORMAL HIGH (ref 70–99)
Glucose-Capillary: 96 mg/dL (ref 70–99)
Glucose-Capillary: 131 mg/dL — ABNORMAL HIGH (ref 70–99)
Glucose-Capillary: 156 mg/dL — ABNORMAL HIGH (ref 70–99)

## 2024-06-04 LAB — TYPE AND SCREEN
ABO/RH(D): AB POS
Antibody Screen: NEGATIVE
Unit division: 0
Unit division: 0
Unit division: 0
Unit division: 0

## 2024-06-04 LAB — MAGNESIUM: Magnesium: 2 mg/dL (ref 1.7–2.4)

## 2024-06-04 MED ORDER — LACTATED RINGERS IR SOLN
Status: DC | PRN
  Administered 2024-06-04: 1000 mL

## 2024-06-04 MED ORDER — LIDOCAINE HCL 2 % IJ SOLN
INTRAMUSCULAR | Status: AC
  Filled 2024-06-04: qty 20

## 2024-06-04 MED ORDER — FREE WATER: 150.0000 mL | Status: AC

## 2024-06-04 MED ORDER — PHENYLEPHRINE 80 MCG/ML (10ML) SYRINGE FOR IV PUSH (FOR BLOOD PRESSURE SUPPORT)
PREFILLED_SYRINGE | INTRAVENOUS | Status: DC | PRN
  Administered 2024-06-04: 160 ug via INTRAVENOUS

## 2024-06-04 MED ORDER — EPHEDRINE 5 MG/ML INJ
INTRAVENOUS | Status: AC
  Filled 2024-06-04: qty 5

## 2024-06-04 MED ORDER — ONDANSETRON HCL 4 MG/2ML IJ SOLN
INTRAMUSCULAR | Status: AC
  Filled 2024-06-04: qty 2

## 2024-06-04 MED ORDER — SUGAMMADEX SODIUM 200 MG/2ML IV SOLN
INTRAVENOUS | Status: AC
  Filled 2024-06-04: qty 2

## 2024-06-04 MED ORDER — 0.9 % SODIUM CHLORIDE (POUR BTL) OPTIME
TOPICAL | Status: DC | PRN
  Administered 2024-06-04: 200 mL

## 2024-06-04 MED ORDER — ROCURONIUM BROMIDE 10 MG/ML (PF) SYRINGE
PREFILLED_SYRINGE | INTRAVENOUS | Status: AC
  Filled 2024-06-04: qty 10

## 2024-06-04 MED ORDER — ACETAMINOPHEN 10 MG/ML IV SOLN
INTRAVENOUS | Status: AC
  Filled 2024-06-04: qty 100

## 2024-06-04 MED ORDER — HYDROMORPHONE HCL 2 MG/ML IJ SOLN
INTRAMUSCULAR | Status: AC
  Filled 2024-06-04: qty 1

## 2024-06-04 MED ORDER — DEXAMETHASONE SOD PHOSPHATE PF 10 MG/ML IJ SOLN
INTRAMUSCULAR | Status: AC
  Filled 2024-06-04: qty 1

## 2024-06-04 MED ORDER — BUPIVACAINE-EPINEPHRINE (PF) 0.25% -1:200000 IJ SOLN
INTRAMUSCULAR | Status: AC
  Filled 2024-06-04: qty 30

## 2024-06-04 MED ORDER — VITAL HP 1.0 CAL PO LIQD
1000.0000 mL | ORAL | Status: AC
  Administered 2024-06-04: 1000 mL

## 2024-06-04 MED ORDER — SUGAMMADEX SODIUM 200 MG/2ML IV SOLN
INTRAVENOUS | Status: DC | PRN
  Administered 2024-06-04: 400 mg via INTRAVENOUS

## 2024-06-04 MED ORDER — PROPOFOL 10 MG/ML IV BOLUS
INTRAVENOUS | Status: AC
  Filled 2024-06-04: qty 20

## 2024-06-04 MED ORDER — PHENYLEPHRINE HCL-NACL 20-0.9 MG/250ML-% IV SOLN
INTRAVENOUS | Status: DC | PRN
  Administered 2024-06-04: 50 ug/min via INTRAVENOUS

## 2024-06-04 MED ORDER — SODIUM CHLORIDE 0.9 % IV SOLN
2.0000 g | Freq: Three times a day (TID) | INTRAVENOUS | Status: DC
  Administered 2024-06-04: 2 g via INTRAVENOUS
  Filled 2024-06-04: qty 12.5

## 2024-06-04 MED ORDER — FUROSEMIDE 10 MG/ML IJ SOLN
80.0000 mg | Freq: Once | INTRAMUSCULAR | Status: AC
  Administered 2024-06-04: 80 mg via INTRAVENOUS
  Filled 2024-06-04: qty 8

## 2024-06-04 MED ORDER — PHENYLEPHRINE 80 MCG/ML (10ML) SYRINGE FOR IV PUSH (FOR BLOOD PRESSURE SUPPORT)
PREFILLED_SYRINGE | INTRAVENOUS | Status: AC
  Filled 2024-06-04: qty 10

## 2024-06-04 MED ORDER — BUPIVACAINE-EPINEPHRINE 0.25% -1:200000 IJ SOLN
INTRAMUSCULAR | Status: DC | PRN
  Administered 2024-06-04: 18 mL

## 2024-06-04 MED ORDER — LIDOCAINE HCL (PF) 2 % IJ SOLN
INTRAMUSCULAR | Status: DC | PRN
  Administered 2024-06-04: 1.5 mg/kg/h via INTRADERMAL

## 2024-06-04 MED ORDER — LACTATED RINGERS IV SOLN: INTRAVENOUS | Status: DC | PRN

## 2024-06-04 MED ORDER — ROCURONIUM BROMIDE 10 MG/ML (PF) SYRINGE
PREFILLED_SYRINGE | INTRAVENOUS | Status: DC | PRN
  Administered 2024-06-04: 20 mg via INTRAVENOUS
  Administered 2024-06-04: 30 mg via INTRAVENOUS

## 2024-06-04 MED ORDER — SODIUM CHLORIDE 0.9 % IV SOLN
2.0000 g | INTRAVENOUS | Status: AC
  Administered 2024-06-04: 2 g via INTRAVENOUS
  Filled 2024-06-04: qty 20

## 2024-06-04 MED ORDER — POTASSIUM CHLORIDE 20 MEQ PO PACK
40.0000 meq | PACK | Freq: Once | ORAL | Status: AC
  Administered 2024-06-04: 40 meq
  Filled 2024-06-04: qty 2

## 2024-06-04 MED ORDER — DEXAMETHASONE SOD PHOSPHATE PF 10 MG/ML IJ SOLN
INTRAMUSCULAR | Status: DC | PRN
  Administered 2024-06-04: 8 mg via INTRAVENOUS

## 2024-06-04 MED ORDER — 0.9 % SODIUM CHLORIDE (POUR BTL) OPTIME
TOPICAL | Status: DC | PRN
  Administered 2024-06-04: 2000 mL

## 2024-06-04 NOTE — Anesthesia Procedure Notes (Signed)
 Arterial Line Insertion Start/End2/09/2024 3:44 PM, 06/04/2024 3:47 PM Performed by: Tilford Franky BIRCH, MD, anesthesiologist  Patient location: Pre-op. Preanesthetic checklist: patient identified, IV checked, site marked, risks and benefits discussed, surgical consent, monitors and equipment checked, pre-op evaluation, timeout performed and anesthesia consent Lidocaine  1% used for infiltration Left, radial was placed Catheter size: 20 G Hand hygiene performed  and maximum sterile barriers used   Attempts: 1 Following insertion, dressing applied. Post procedure assessment: normal and unchanged  Patient tolerated the procedure well with no immediate complications.

## 2024-06-04 NOTE — Progress Notes (Signed)
 PHARMACY NOTE:  ANTIMICROBIAL RENAL DOSAGE ADJUSTMENT  Current antimicrobial regimen includes a mismatch between antimicrobial dosage and estimated renal function.  As per policy approved by the Pharmacy & Therapeutics and Medical Executive Committees, the antimicrobial dosage will be adjusted accordingly.  Current antimicrobial dosage:  Cefepime  2 gm q12hrs  Indication: Necrotizing SSTI   Renal Function:  Estimated Creatinine Clearance: 63.1 mL/min (by C-G formula based on SCr of 0.89 mg/dL).      Antimicrobial dosage has been changed to:  Cefepime  2 gm 8 hours    Thank you for allowing pharmacy to be a part of this patient's care.  Rosaline IVAR Edison, Pharm.D Use secure chat for questions 06/04/2024 7:11 AM

## 2024-06-04 NOTE — TOC Progression Note (Signed)
 Transition of Care [facility]) - Progression Note    Patient Details  Name: [name] MRN: [id number] Date of Birth: 1945-08-18  [facility] CM/SW Contact  Deidre Carino, Nathanel, RN Phone Number: 06/04/2024, 11:17 AM  Clinical Narrative: Not medically stable-intubated.       Expected Discharge Plan: Skilled Nursing Facility Barriers to Discharge: Continued Medical Work up               Expected Discharge Plan and Services In-house Referral: NA Discharge Planning Services: CM Consult   Living arrangements for the past 2 months: Apartment, Skilled Nursing Facility                                       Social Drivers of Health (SDOH) Interventions SDOH Screenings   Food Insecurity: No Food Insecurity (05/04/2024)  Housing: Low Risk (05/04/2024)  Transportation Needs: Unknown (05/04/2024)  Utilities: Not At Risk (05/04/2024)  Social Connections: Moderately Integrated (05/04/2024)  Tobacco Use: Low Risk (06/01/2024)    Readmission Risk Interventions    06/03/2024    4:40 PM  Readmission Risk Prevention Plan  Transportation Screening Complete  PCP or Specialist Appt within 3-5 Days Complete  HRI or Home Care Consult Complete  Social Work Consult for Recovery Care Planning/Counseling Complete  Palliative Care Screening Not Applicable  Medication Review Oceanographer) Complete

## 2024-06-04 NOTE — Progress Notes (Signed)
 RT didn't wen the Pt due to the Pt going to the OR.

## 2024-06-04 NOTE — Anesthesia Procedure Notes (Signed)
 Date/Time: 06/04/2024 2:50 PM  Performed by: Nada Corean CROME, CRNAPre-anesthesia Checklist: Patient identified, Emergency Drugs available, Suction available, Patient being monitored and Timeout performed Patient Re-evaluated:Patient Re-evaluated prior to induction Oxygen Delivery Method: Circle system utilized Preoxygenation: Pre-oxygenation with 100% oxygen Induction Type: IV induction Placement Confirmation: positive ETCO2 Dental Injury: Teeth and Oropharynx as per pre-operative assessment  Comments: Pt received from ICU RN/RT intubated with 7.0 ETT; attached to anesthesia circuit and OR monitors.

## 2024-06-04 NOTE — Progress Notes (Signed)
 Progress Note: General Surgery Service   Chief Complaint/Subjective: Intubated, sedated  Objective: Vital signs in last 24 hours: Temp:  [98.1 F (36.7 C)-100 F (37.8 C)] 98.8 F (37.1 C) (02/06 0630) Pulse Rate:  [68-86] 79 (02/06 0630) Resp:  [13-22] 15 (02/06 0630) BP: (101-125)/(47-64) 110/57 (02/05 1700) SpO2:  [83 %-100 %] 99 % (02/06 0630) Arterial Line BP: (117-171)/(46-73) 129/66 (02/06 0630) FiO2 (%):  [30 %] 30 % (02/06 0457) Weight:  [110.2 kg] 110.2 kg (02/06 0500) Last BM Date : 06/02/24  Intake/Output from previous day: 02/05 0701 - 02/06 0700 In: 4262.5 [I.V.:1764.1; Blood:690.3; NG/GT:713.7; IV Piggyback:1094.5] Out: 450 [Urine:450] Intake/Output this shift: No intake/output data recorded.  Constitutional: NAD; conversant; no deformities Buttock - Rectal tube in place, wound dressed  Lab Results: CBC  Recent Labs    06/03/24 1039 06/03/24 1745 06/04/24 0343  WBC 17.8*  --  13.8*  HGB 5.7* 9.4* 9.1*  HCT 18.7* 29.8* 29.0*  PLT 259  --  286   BMET Recent Labs    06/03/24 1200 06/04/24 0343  NA 141 140  K 3.8 3.6  CL 108 108  CO2 21* 21*  GLUCOSE 132* 118*  BUN 45* 37*  CREATININE 0.93 0.89  CALCIUM 8.0* 8.1*   PT/INR Recent Labs    06/01/24 2126 06/02/24 1659  LABPROT 21.1* 17.6*  INR 1.7* 1.4*   ABG Recent Labs    06/01/24 2103  PHART 7.35  HCO3 25.4    Anti-infectives: Anti-infectives (From admission, onward)    Start     Dose/Rate Route Frequency Ordered Stop   06/04/24 1200  ceFEPIme  (MAXIPIME ) 2 g in sodium chloride  0.9 % 100 mL IVPB        2 g 200 mL/hr over 30 Minutes Intravenous Every 8 hours 06/04/24 0710     06/03/24 1600  ceFEPIme  (MAXIPIME ) 2 g in sodium chloride  0.9 % 100 mL IVPB  Status:  Discontinued        2 g 200 mL/hr over 30 Minutes Intravenous Every 12 hours 06/03/24 0728 06/04/24 0710   06/01/24 2200  metroNIDAZOLE  (FLAGYL ) IVPB 500 mg        500 mg 100 mL/hr over 60 Minutes Intravenous Every 12  hours 06/01/24 1530     06/01/24 2200  linezolid  (ZYVOX ) IVPB 600 mg        600 mg 300 mL/hr over 60 Minutes Intravenous Every 12 hours 06/01/24 1536     06/01/24 2000  ceFEPIme  (MAXIPIME ) 2 g in sodium chloride  0.9 % 100 mL IVPB  Status:  Discontinued        2 g 200 mL/hr over 30 Minutes Intravenous Every 8 hours 06/01/24 1544 06/03/24 0728   06/01/24 1445  clindamycin  (CLEOCIN ) IVPB 600 mg        600 mg 100 mL/hr over 30 Minutes Intravenous  Once 06/01/24 1441 06/01/24 1541   06/01/24 1145  vancomycin  (VANCOREADY) IVPB 2000 mg/400 mL        2,000 mg 200 mL/hr over 120 Minutes Intravenous  Once 06/01/24 1139 06/01/24 1435   06/01/24 1115  ceFEPIme  (MAXIPIME ) 2 g in sodium chloride  0.9 % 100 mL IVPB        2 g 200 mL/hr over 30 Minutes Intravenous  Once 06/01/24 1108 06/01/24 1225       Medications: Scheduled Meds:  Chlorhexidine  Gluconate Cloth  6 each Topical Daily   donepezil   5 mg Per Tube QHS   free water   200 mL Per Tube Q4H  mouth rinse  15 mL Mouth Rinse Q2H   pantoprazole  (PROTONIX ) IV  40 mg Intravenous Q24H   polyethylene glycol  17 g Per Tube Daily   senna  1 tablet Per Tube BID   Continuous Infusions:  ceFEPime  (MAXIPIME ) IV     fentaNYL  infusion INTRAVENOUS 75 mcg/hr (06/04/24 9366)   linezolid  (ZYVOX ) IV Stopped (06/04/24 0054)   metronidazole  Stopped (06/03/24 2338)   norepinephrine  (LEVOPHED ) Adult infusion Stopped (06/03/24 1732)   propofol  (DIPRIVAN ) infusion Stopped (06/03/24 1430)   PRN Meds:.acetaminophen  (TYLENOL ) oral liquid 160 mg/5 mL, albuterol , fentaNYL , HYDROmorphone  (DILAUDID ) injection, ondansetron  **OR** ondansetron  (ZOFRAN ) IV, mouth rinse  Assessment/Plan: [name] is a 79 year old female with a necrotizing soft tissue infection of the buttock and perianal area s/p excisional debridement on 06/01/24.  Wet to dry dressings Rectal tube Plan was for return to surgery today to re-evaluate wound and create colostomy to divert stool from  wound.  Anemia improved   LOS: 3 days    [name], MD  [facility] Use [email or web address] to contact on call provider  Daily Billing: [phone] - post op

## 2024-06-04 NOTE — Progress Notes (Signed)
 eLink Physician-Brief Progress Note Patient Name: [name] DOB: 1945-05-30 MRN: [id number]   Date of Service  06/04/2024  HPI/Events of Note  RN reports pt having bigeminy & was also on day shift.  K 3.6 w/morning labs...no repletion ordered.  No further labs ordered.  Pt did go to OR today for loop colostomy, no MIVF infusing.  Asking if you want to send any now  eICU Interventions  K replaced Mg check On FWF. No IVF needed.      Intervention Category Intermediate Interventions: Electrolyte abnormality - evaluation and management Evaluation Type: Other  Jaquel Glassburn 06/04/2024, 8:26 PM

## 2024-06-04 NOTE — Progress Notes (Signed)
 " PROGRESS NOTE    [name]  FMW:[id number] DOB: July 27, 1945 DOA: 06/01/2024 PCP: [facility] [address] [facility]   Chief Complaint: Fever, infected wound   HPI: [name] is a 79 y.o. female with medical history significant for dementia, diastolic dysfunction, GERD, hypertension, hyperlipidemia, recent hospitalization at [facility] in the middle of January due to a fall and rhabdomyolysis who is now being admitted to the hospital with a necrotizing sacral wound.  Patient is not able to provide any history, history is provided by her son who is at the bedside and visits her frequently at the facility to which she was discharged in January.  He thinks that she developed a sacral wound while she was at [facility], or at her nursing facility.  He states that they have been cleaning her and changing dressings on a daily basis.  Apparently today she had a fever at her facility, was sent from [facility] and rehab for evaluation.  Workup in the emergency department including CT as detailed below shows evidence of a deep necrotizing wound.  She has been seen in consultation by general surgery, who plans to take her to the OR urgently.     Subjective: Patient remains sedated and intubated.  She will still briefly open her eyes but really follow no commands at this point.  Hospital Course: 06/02/24: Taken to OR for extensive sacral debridement, remains on mech vent overnight, low dose levophed , plans for return to OR 06/04/2024.   Assessment and Plan:  MONSERRATE Forbes is a 79 y.o. female with medical history significant for dementia, diastolic dysfunction, GERD, hypertension, hyperlipidemia, recent hospitalization at [facility] in the middle of January due to a fall and rhabdomyolysis who is now being admitted to the hospital with a necrotizing sacral wound.   Sepsis due to necrotizing sacral wound-meeting criteria with tachycardia, report of fever, leukocytosis.  Patient is hemodynamically stable, no evidence of endorgan  dysfunction. -Inpatient admission to ICU on vent -Empiric IV antibiotics IV Flagyl , linezolid , cefepime  -Follow-up blood cultures, surgical cultures -to OR today. On vent, plans to return to OR 06/03/24 for further debridement - sedation/pain- propofol  & fentanyl  - Patient is currently on 3 mics per minute of IV norepinephrine    Dementia-Aricept    Hypernatremia-likely due to significant dehydration in the setting of her sepsis. -Will continue LR infusion and monitor sodium daily   Chronic normocytic anemia-appears to be stable  GI prophy- protonix  IV   DVT prophylaxis: SCDs only due to impending surgery     Code Status: Full Code   Consults called: General Surgery     DVT prophylaxis: SCDs Start: 06/01/24 1524     Code Status: Full Code Family Communication: None available Disposition Plan: pending return OR, I suspect she will need SNF after extensive debridement Reason for continuing need for hospitalization: surgery, f/u cultures, may need wound vac  Objective: Vitals:   06/04/24 0914 06/04/24 1100 06/04/24 1124 06/04/24 1200  BP:      Pulse: 85 67  66  Resp: 18 13  14   Temp: 98.6 F (37 C) 98.4 F (36.9 C)  98.4 F (36.9 C)  TempSrc:      SpO2: 100% 100% 100% 100%  Weight:      Height:        Intake/Output Summary (Last 24 hours) at 06/04/2024 1536 Last data filed at 06/04/2024 1421 Gross per 24 hour  Intake 2390.04 ml  Output 200 ml  Net 2190.04 ml   Filed Weights   06/02/24  0451 06/03/24 0500 06/04/24 0500  Weight: 101.1 kg 106.6 kg 110.2 kg    Examination:  Patient is sedated on the vent.  She will open eyes to verbal commands HEENT: Elevating up upper lids shows no scleral icterus, disconjugate gaze ET tube and OG tube in place Regular rate and rhythm S1-S2 no murmurs rubs gallops Lungs are clear in the anterior and lateral lung field Abdomen: Is soft, nontender nondistended bowel sounds are present Extremities: No cyanosis clubbing or  edema Foley catheter in place    Data Reviewed: I have personally reviewed following labs and imaging studies  CBC: Recent Labs  Lab 06/01/24 1106 06/01/24 2126 06/02/24 0135 06/02/24 1659 06/03/24 1039 06/03/24 1745 06/04/24 0343  WBC 19.2* 20.3* 20.6* 26.5* 17.8*  --  13.8*  NEUTROABS 15.1*  --   --  21.3* 13.5*  --  13.2*  HGB 8.9* 8.4* 7.8* 7.1* 5.7* 9.4* 9.1*  HCT 29.1* 27.5* 25.6* 23.1* 18.7* 29.8* 29.0*  MCV 84.3 84.9 85.3 84.9 84.6  --  85.8  PLT 401* 342 341 372 259  --  286   Basic Metabolic Panel: Recent Labs  Lab 06/01/24 1106 06/01/24 2126 06/02/24 0135 06/03/24 1200 06/04/24 0343  NA 149* 146* 145 141 140  K 3.7 4.4 4.1 3.8 3.6  CL 112* 112* 112* 108 108  CO2 26 20* 23 21* 21*  GLUCOSE 128* 140* 188* 132* 118*  BUN 56* 52* 54* 45* 37*  CREATININE 1.02* 0.94 1.03* 0.93 0.89  CALCIUM 9.1 8.6* 8.4* 8.0* 8.1*   GFR: Estimated Creatinine Clearance: 63.1 mL/min (by C-G formula based on SCr of 0.89 mg/dL). Liver Function Tests: Recent Labs  Lab 06/01/24 1106 06/01/24 2126 06/02/24 0135 06/03/24 1200 06/04/24 0343  AST 69* 43* 32 19 15  ALT 17 13 12 8 7   ALKPHOS 170* 122 126 101 79  BILITOT 0.8 0.8 0.6 0.6 0.6  PROT 7.7 5.8* 5.5* 5.4* 5.4*  ALBUMIN  2.5* 2.1* 2.1* 2.2* 2.3*   No results for input(s): LIPASE, AMYLASE in the last 168 hours. No results for input(s): AMMONIA in the last 168 hours. Coagulation Profile: Recent Labs  Lab 06/01/24 1106 06/01/24 2126 06/02/24 1659  INR 1.7* 1.7* 1.4*   Cardiac Enzymes: No results for input(s): CKTOTAL, CKMB, CKMBINDEX, TROPONINI in the last 168 hours. ProBNP, BNP (last 5 results) No results for input(s): PROBNP, BNP in the last 8760 hours. HbA1C: No results for input(s): HGBA1C in the last 72 hours. CBG: Recent Labs  Lab 06/03/24 2020 06/03/24 2346 06/04/24 0349 06/04/24 0748 06/04/24 1143  GLUCAP 103* 104* 100* 100* 112*   Lipid Profile: Recent Labs     06/02/24 0135  TRIG 218*   Thyroid  Function Tests: No results for input(s): TSH, T4TOTAL, FREET4, T3FREE, THYROIDAB in the last 72 hours. Anemia Panel: No results for input(s): VITAMINB12, FOLATE, FERRITIN, TIBC, IRON, RETICCTPCT in the last 72 hours. Sepsis Labs: Recent Labs  Lab 06/01/24 2126 06/02/24 0135 06/02/24 1648 06/02/24 2119  LATICACIDVEN 1.5 1.4 1.5 1.8    Recent Results (from the past 240 hours)  Blood Culture (routine x 2)     Status: None (Preliminary result)   Collection Time: 06/01/24 11:40 AM   Specimen: BLOOD LEFT FOREARM  Result Value Ref Range Status   Specimen Description   Final    BLOOD LEFT FOREARM Performed at [facility], [address], [place], [place] [address]    Special Requests   Final    BOTTLES DRAWN AEROBIC  AND ANAEROBIC Blood Culture results may not be optimal due to an inadequate volume of blood received in culture bottles Performed at [facility], [address] [place], [place] [address]    Culture   Final    NO GROWTH 3 DAYS Performed at [facility], [address], [place], [place] [address]    Report Status PENDING  Incomplete  Blood Culture (routine x 2)     Status: None (Preliminary result)   Collection Time: 06/01/24 11:41 AM   Specimen: BLOOD RIGHT ARM  Result Value Ref Range Status   Specimen Description   Final    BLOOD RIGHT ARM Performed at [facility], [address] [place], [place] [address]    Special Requests   Final    BOTTLES DRAWN AEROBIC ONLY Blood Culture results may not be optimal due to an inadequate volume of blood received in culture bottles Performed at [facility], [address], [place], [place] [address]    Culture   Final    NO GROWTH 3 DAYS Performed at [facility], [address], [place], [place] [address]    Report Status PENDING  Incomplete  Aerobic/Anaerobic Culture w Gram Stain (surgical/deep  wound)     Status: None (Preliminary result)   Collection Time: 06/01/24  5:38 PM   Specimen: Soft Tissue, Other  Result Value Ref Range Status   Specimen Description   Final    TISSUE Performed at [facility], [address], [place], [place] [address]    Special Requests SACRAL WD  Final   Gram Stain   Final    NO WBC SEEN ABUNDANT GRAM NEGATIVE RODS FEW GRAM POSITIVE COCCI Performed at [facility], [address] [place], [place] [address]    Culture   Final    MODERATE PROTEUS MIRABILIS MIXED ANAEROBIC FLORA PRESENT.  CALL LAB IF FURTHER IID REQUIRED.    Report Status PENDING  Incomplete   Organism ID, Bacteria PROTEUS MIRABILIS  Final      Susceptibility   Proteus mirabilis - MIC*    AMPICILLIN <=2 SENSITIVE Sensitive     CEFAZOLIN (NON-URINE) 4 INTERMEDIATE Intermediate     CEFEPIME  <=0.12 SENSITIVE Sensitive     ERTAPENEM <=0.12 SENSITIVE Sensitive     CEFTRIAXONE  <=0.25 SENSITIVE Sensitive     CIPROFLOXACIN >=4 RESISTANT Resistant     GENTAMICIN <=1 SENSITIVE Sensitive     MEROPENEM 1 SENSITIVE Sensitive     TRIMETH/SULFA <=20 SENSITIVE Sensitive     AMPICILLIN/SULBACTAM <=2 SENSITIVE Sensitive     PIP/TAZO Value in next row Sensitive      <=4 SENSITIVEThis is a modified FDA-approved test that has been validated and its performance characteristics determined by the reporting laboratory.  This laboratory is certified under the Clinical Laboratory Improvement Amendments CLIA as qualified to perform high complexity clinical laboratory testing.    * MODERATE PROTEUS MIRABILIS  MRSA Next Gen by PCR, Nasal     Status: None   Collection Time: 06/01/24  6:43 PM   Specimen: Nasal Mucosa; Nasal Swab  Result Value Ref Range Status   MRSA by PCR Next Gen NOT DETECTED NOT DETECTED Final    Comment: (NOTE) The GeneXpert MRSA Assay (FDA approved for NASAL specimens only), is one component of a comprehensive MRSA colonization surveillance program. It is  not intended to diagnose MRSA infection nor to guide or monitor treatment for MRSA infections. Test performance is  not FDA approved in patients less than 31 years old. Performed at [facility], [address], [place], [place] [address]   Culture, blood (Routine x 2)     Status: None (Preliminary result)   Collection Time: 06/02/24  4:59 PM   Specimen: BLOOD LEFT HAND  Result Value Ref Range Status   Specimen Description   Final    BLOOD LEFT HAND Performed at [facility], [address] [place], [place] [address]    Special Requests   Final    BOTTLES DRAWN AEROBIC AND ANAEROBIC Blood Culture adequate volume Performed at [facility], [address], [place], [place] [address]    Culture   Final    NO GROWTH 2 DAYS Performed at [facility], [address] [place], [place] [address]    Report Status PENDING  Incomplete  Culture, blood (Routine x 2)     Status: None (Preliminary result)   Collection Time: 06/02/24  5:15 PM   Specimen: BLOOD LEFT HAND  Result Value Ref Range Status   Specimen Description   Final    BLOOD LEFT HAND Performed at [facility], [address] [place], [place] [address]    Special Requests   Final    BOTTLES DRAWN AEROBIC AND ANAEROBIC Blood Culture adequate volume Performed at [name], [address] [place], [place] [address]    Culture   Final    NO GROWTH 2 DAYS Performed at [facility], [address], [place], [place] [address]    Report Status PENDING  Incomplete     Radiology Studies: No results found.   Scheduled Meds:  [MAR Hold] Chlorhexidine  Gluconate Cloth  6 each Topical Daily   [MAR Hold] donepezil   5 mg Per Tube QHS   [MAR Hold] free water   150 mL Per Tube Q4H   [MAR Hold] mouth rinse  15 mL Mouth Rinse Q2H   [MAR Hold] pantoprazole  (PROTONIX ) IV  40 mg Intravenous Q24H   [MAR Hold] polyethylene glycol  17 g Per Tube Daily   [MAR  Hold] senna  1 tablet Per Tube BID   Continuous Infusions:  [MAR Hold] cefTRIAXone  (ROCEPHIN )  IV     fentaNYL  infusion INTRAVENOUS 100 mcg/hr (06/04/24 1421)   [MAR Hold] metronidazole  Stopped (06/04/24 1035)   [MAR Hold] norepinephrine  (LEVOPHED ) Adult infusion Stopped (06/03/24 1732)   propofol  (DIPRIVAN ) infusion 50 mcg/kg/min (06/04/24 1529)     LOS: 3 days   Time spent: 50 minutes  Lonni KANDICE Moose, MD  [facility]  06/04/2024, 3:36 PM   "

## 2024-06-04 NOTE — Progress Notes (Signed)
 "       Subjective:  Patient intubated on ventilator   Antibiotics:  Anti-infectives (From admission, onward)    Start     Dose/Rate Route Frequency Ordered Stop   06/04/24 2000  cefTRIAXone  (ROCEPHIN ) 2 g in sodium chloride  0.9 % 100 mL IVPB        2 g 200 mL/hr over 30 Minutes Intravenous Every 24 hours 06/04/24 1156     06/04/24 1200  ceFEPIme  (MAXIPIME ) 2 g in sodium chloride  0.9 % 100 mL IVPB  Status:  Discontinued        2 g 200 mL/hr over 30 Minutes Intravenous Every 8 hours 06/04/24 0710 06/04/24 1156   06/03/24 1600  ceFEPIme  (MAXIPIME ) 2 g in sodium chloride  0.9 % 100 mL IVPB  Status:  Discontinued        2 g 200 mL/hr over 30 Minutes Intravenous Every 12 hours 06/03/24 0728 06/04/24 0710   06/01/24 2200  metroNIDAZOLE  (FLAGYL ) IVPB 500 mg        500 mg 100 mL/hr over 60 Minutes Intravenous Every 12 hours 06/01/24 1530     06/01/24 2200  linezolid  (ZYVOX ) IVPB 600 mg  Status:  Discontinued        600 mg 300 mL/hr over 60 Minutes Intravenous Every 12 hours 06/01/24 1536 06/04/24 1156   06/01/24 2000  ceFEPIme  (MAXIPIME ) 2 g in sodium chloride  0.9 % 100 mL IVPB  Status:  Discontinued        2 g 200 mL/hr over 30 Minutes Intravenous Every 8 hours 06/01/24 1544 06/03/24 0728   06/01/24 1445  clindamycin  (CLEOCIN ) IVPB 600 mg        600 mg 100 mL/hr over 30 Minutes Intravenous  Once 06/01/24 1441 06/01/24 1541   06/01/24 1145  vancomycin  (VANCOREADY) IVPB 2000 mg/400 mL        2,000 mg 200 mL/hr over 120 Minutes Intravenous  Once 06/01/24 1139 06/01/24 1435   06/01/24 1115  ceFEPIme  (MAXIPIME ) 2 g in sodium chloride  0.9 % 100 mL IVPB        2 g 200 mL/hr over 30 Minutes Intravenous  Once 06/01/24 1108 06/01/24 1225       Medications: Scheduled Meds:  Chlorhexidine  Gluconate Cloth  6 each Topical Daily   donepezil   5 mg Per Tube QHS   free water   150 mL Per Tube Q4H   mouth rinse  15 mL Mouth Rinse Q2H   pantoprazole  (PROTONIX ) IV  40 mg Intravenous Q24H    polyethylene glycol  17 g Per Tube Daily   senna  1 tablet Per Tube BID   Continuous Infusions:  cefTRIAXone  (ROCEPHIN )  IV     fentaNYL  infusion INTRAVENOUS 75 mcg/hr (06/04/24 1221)   metronidazole  Stopped (06/04/24 1035)   norepinephrine  (LEVOPHED ) Adult infusion Stopped (06/03/24 1732)   propofol  (DIPRIVAN ) infusion Stopped (06/03/24 1430)   PRN Meds:.acetaminophen  (TYLENOL ) oral liquid 160 mg/5 mL, albuterol , fentaNYL , HYDROmorphone  (DILAUDID ) injection, ondansetron  **OR** ondansetron  (ZOFRAN ) IV, mouth rinse    Objective: Weight change: 3.6 kg  Intake/Output Summary (Last 24 hours) at 06/04/2024 1330 Last data filed at 06/04/2024 1221 Gross per 24 hour  Intake 3147.25 ml  Output 200 ml  Net 2947.25 ml   Blood pressure (!) 110/57, pulse 66, temperature 98.4 F (36.9 C), resp. rate 14, height 5' 3 (1.6 m), weight 110.2 kg, SpO2 100%. Temp:  [98.4 F (36.9 C)-100 F (37.8 C)] 98.4 F (36.9 C) (02/06 1200) Pulse Rate:  [65-86] 66 (02/06 1200)  Resp:  [13-20] 14 (02/06 1200) BP: (101-115)/(50-57) 110/57 (02/05 1700) SpO2:  [97 %-100 %] 100 % (02/06 1200) Arterial Line BP: (115-182)/(46-99) 133/76 (02/06 1200) FiO2 (%):  [30 %] 30 % (02/06 1124) Weight:  [110.2 kg] 110.2 kg (02/06 0500)  Physical Exam: Physical Exam Constitutional:      Interventions: She is intubated.  HENT:     Head: Normocephalic and atraumatic.  Eyes:     General:        Right eye: No discharge.        Left eye: No discharge.     Extraocular Movements: Extraocular movements intact.  Cardiovascular:     Rate and Rhythm: Tachycardia present.  Pulmonary:     Effort: She is intubated.  Abdominal:     General: There is no distension.  Skin:    General: Skin is warm and dry.  Neurological:     General: No focal deficit present.     Mental Status: She is alert.      CBC:    BMET Recent Labs    06/03/24 1200 06/04/24 0343  NA 141 140  K 3.8 3.6  CL 108 108  CO2 21* 21*  GLUCOSE 132*  118*  BUN 45* 37*  CREATININE 0.93 0.89  CALCIUM 8.0* 8.1*     Liver Panel  Recent Labs    06/03/24 1200 06/04/24 0343  PROT 5.4* 5.4*  ALBUMIN  2.2* 2.3*  AST 19 15  ALT 8 7  ALKPHOS 101 79  BILITOT 0.6 0.6       Sedimentation Rate No results for input(s): ESRSEDRATE in the last 72 hours. C-Reactive Protein No results for input(s): CRP in the last 72 hours.  Micro Results: Recent Results (from the past 720 hours)  Culture, blood (Routine X 2) w Reflex to ID Panel     Status: None   Collection Time: 05/06/24 11:37 AM   Specimen: BLOOD LEFT HAND  Result Value Ref Range Status   Specimen Description BLOOD LEFT HAND  Final   Special Requests   Final    BOTTLES DRAWN AEROBIC ONLY Blood Culture results may not be optimal due to an inadequate volume of blood received in culture bottles   Culture   Final    NO GROWTH 5 DAYS Performed at [facility], [address], [place], [place] [address]    Report Status 05/11/2024 FINAL  Final  Culture, blood (Routine X 2) w Reflex to ID Panel     Status: None   Collection Time: 05/06/24 11:43 AM   Specimen: BLOOD RIGHT ARM  Result Value Ref Range Status   Specimen Description BLOOD RIGHT ARM  Final   Special Requests   Final    BOTTLES DRAWN AEROBIC AND ANAEROBIC Blood Culture adequate volume   Culture   Final    NO GROWTH 5 DAYS Performed at [facility], [address] [place], [place] [address]    Report Status 05/11/2024 FINAL  Final  Aerobic/Anaerobic Culture w Gram Stain (surgical/deep wound)     Status: None   Collection Time: 05/09/24  1:59 PM   Specimen: Synovium  Result Value Ref Range Status   Specimen Description SYNOVIAL  Final   Special Requests NONE  Final   Gram Stain   Final    RARE WBC PRESENT,BOTH PMN AND MONONUCLEAR NO ORGANISMS SEEN    Culture   Final    No growth aerobically or anaerobically. Performed at [facility], [address] [place], [place] [address]    Report  Status 05/14/2024 FINAL  Final  Blood Culture (routine x 2)     Status: None (Preliminary result)   Collection Time: 06/01/24 11:40 AM   Specimen: BLOOD LEFT FOREARM  Result Value Ref Range Status   Specimen Description   Final    BLOOD LEFT FOREARM Performed at [facility], [address], [place], [place] [address]    Special Requests   Final    BOTTLES DRAWN AEROBIC AND ANAEROBIC Blood Culture results may not be optimal due to an inadequate volume of blood received in culture bottles Performed at [facility], [address], [place], [place] [address]    Culture   Final    NO GROWTH 3 DAYS Performed at [facility], [address] [place], [place] [address]    Report Status PENDING  Incomplete  Blood Culture (routine x 2)     Status: None (Preliminary result)   Collection Time: 06/01/24 11:41 AM   Specimen: BLOOD RIGHT ARM  Result Value Ref Range Status   Specimen Description   Final    BLOOD RIGHT ARM Performed at [facility], [address] [place], [place] [address]    Special Requests   Final    BOTTLES DRAWN AEROBIC ONLY Blood Culture results may not be optimal due to an inadequate volume of blood received in culture bottles Performed at [facility], [address], [place], [place] [address]    Culture   Final    NO GROWTH 3 DAYS Performed at [facility], [address], [place], [place] [address]    Report Status PENDING  Incomplete  Aerobic/Anaerobic Culture w Gram Stain (surgical/deep wound)     Status: None (Preliminary result)   Collection Time: 06/01/24  5:38 PM   Specimen: Soft Tissue, Other  Result Value Ref Range Status   Specimen Description   Final    TISSUE Performed at [facility], [address] [place], [place] [address]    Special Requests SACRAL WD  Final   Gram Stain   Final    NO WBC SEEN ABUNDANT GRAM NEGATIVE RODS FEW GRAM POSITIVE COCCI Performed at  [facility], [address] [place], [place] [address]    Culture   Final    MODERATE PROTEUS MIRABILIS MIXED ANAEROBIC FLORA PRESENT.  CALL LAB IF FURTHER IID REQUIRED.    Report Status PENDING  Incomplete   Organism ID, Bacteria PROTEUS MIRABILIS  Final      Susceptibility   Proteus mirabilis - MIC*    AMPICILLIN <=2 SENSITIVE Sensitive     CEFAZOLIN (NON-URINE) 4 INTERMEDIATE Intermediate     CEFEPIME  <=0.12 SENSITIVE Sensitive     ERTAPENEM <=0.12 SENSITIVE Sensitive     CEFTRIAXONE  <=0.25 SENSITIVE Sensitive     CIPROFLOXACIN >=4 RESISTANT Resistant     GENTAMICIN <=1 SENSITIVE Sensitive     MEROPENEM 1 SENSITIVE Sensitive     TRIMETH/SULFA <=20 SENSITIVE Sensitive     AMPICILLIN/SULBACTAM <=2 SENSITIVE Sensitive     PIP/TAZO Value in next row Sensitive      <=4 SENSITIVEThis is a modified FDA-approved test that has been validated and its performance characteristics determined by the reporting laboratory.  This laboratory is certified under the Clinical Laboratory Improvement Amendments CLIA as qualified to perform high complexity clinical laboratory testing.    * MODERATE PROTEUS MIRABILIS  MRSA Next  Gen by PCR, Nasal     Status: None   Collection Time: 06/01/24  6:43 PM   Specimen: Nasal Mucosa; Nasal Swab  Result Value Ref Range Status   MRSA by PCR Next Gen NOT DETECTED NOT DETECTED Final    Comment: (NOTE) The GeneXpert MRSA Assay (FDA approved for NASAL specimens only), is one component of a comprehensive MRSA colonization surveillance program. It is not intended to diagnose MRSA infection nor to guide or monitor treatment for MRSA infections. Test performance is not FDA approved in patients less than 69 years old. Performed at [facility], [address] [place], [place] [address]   Culture, blood (Routine x 2)     Status: None (Preliminary result)   Collection Time: 06/02/24  4:59 PM   Specimen: BLOOD LEFT HAND  Result Value Ref Range  Status   Specimen Description   Final    BLOOD LEFT HAND Performed at [facility], [address], [place], [place] [address]    Special Requests   Final    BOTTLES DRAWN AEROBIC AND ANAEROBIC Blood Culture adequate volume Performed at [facility], [address], [place], [place] [address]    Culture   Final    NO GROWTH 2 DAYS Performed at [facility], [address] [place], [place] [address]    Report Status PENDING  Incomplete  Culture, blood (Routine x 2)     Status: None (Preliminary result)   Collection Time: 06/02/24  5:15 PM   Specimen: BLOOD LEFT HAND  Result Value Ref Range Status   Specimen Description   Final    BLOOD LEFT HAND Performed at [facility], [address], [place], [place] [address]    Special Requests   Final    BOTTLES DRAWN AEROBIC AND ANAEROBIC Blood Culture adequate volume Performed at [facility], [address], [place], [place] [address]    Culture   Final    NO GROWTH 2 DAYS Performed at [facility], [address], [place], [place] [address]    Report Status PENDING  Incomplete    Studies/Results: No results found.    Assessment/Plan:  INTERVAL HISTORY: fairly S proteus isolated   Principal Problem:   Sacral decubitus ulcer Active Problems:   Necrotizing cellulitis    [name] is a 79 y.o. female with necrotizing soft tissue infection of the buttocks and perineum medial area status post excisional debridement on 06/01/2024 with Proteus vulgaris having been isolated on intraoperative cultures, along with a mix of anaerobic flora.  If she was not penicillin allergic I would simplify her to Unasyn.  We cannot inquire about her penicillin allergy to her directly but certainly if someone did talk to the family and they were agreeable to do an amoxicillin challenge we could do so.  In the interim though we will change her over to ceftriaxone  and  metronidazole  and discontinue the Zyvox   If she has new cultures taken in the OR we will follow-up on these  My partner Dr. Luiz is covering this weekend and Dr. Overton taking over service on Monday.  If no further pathogens are isolated would give her 10 days of effective antibiotics post surgery with close follow-up of her surgical sites.   CRITICAL CARE Performed by: [name]   Total critical care time: 30 minutes  Critical care time was exclusive of separately billable procedures and treating other patients.  Critical care was necessary to treat or prevent imminent or life-threatening deterioration.  Critical care was time spent personally by me on the following activities: development of treatment plan with patient and/or surrogate as well as nursing, discussions with consultants, evaluation of patient's response to treatment, examination of patient, obtaining history from patient or surrogate, ordering and performing treatments and interventions, ordering and review of laboratory studies, ordering and review of radiographic studies, pulse oximetry and re-evaluation of patient's condition.   Evaluation of the patient requires complex antimicrobial therapy evaluation, counseling , isolation needs to reduce disease transmission and risk assessment and mitigation.      LOS: 3 days   [name] 06/04/2024, 1:30 PM  "

## 2024-06-04 NOTE — Transfer of Care (Signed)
 Immediate Anesthesia Transfer of Care Note  Patient: [name]  Procedure(s) Performed: CREATION, COLOSTOMY, LOOP, LAPAROSCOPIC  Patient Location: ICU  Anesthesia Type:General  Level of Consciousness: awake, drowsy, and Patient remains intubated per anesthesia plan  Airway & Oxygen Therapy: Patient remains intubated per anesthesia plan  Post-op Assessment: Report given to RN and Post -op Vital signs reviewed and stable  Post vital signs: Reviewed and stable  Last Vitals:  Vitals Value Taken Time  BP 104/58 06/04/24 16:35  Temp    Pulse 59 06/04/24 16:39  Resp 15 06/04/24 16:39  SpO2 99 % 06/04/24 16:39  Vitals shown include unfiled device data.  Last Pain:  Vitals:   06/03/24 2000  TempSrc: Bladder  PainSc:          Complications: No notable events documented.

## 2024-06-04 NOTE — Op Note (Signed)
 "  Patient: [name] (04-12-1946, [id number])  Date of Surgery: 06/04/2024  Preoperative Diagnosis: NSTI of the sacrum and perianal area  Postoperative Diagnosis: NSTI of the sacrum and perianal area  Surgical Procedure: CREATION, COLOSTOMY, LOOP, LAPAROSCOPIC: 44188 (CPT) Mobilization of the splenic flexure  Operative Team Members:  Surgeons and Role:    * Jefferey Lippmann, [name] PARAS, MD - Primary   Anesthesiologist: Tilford Franky BIRCH, MD; Cleotilde Butler Dade, MD CRNA: Nada Corean LITTIE, CRNA   Anesthesia: General   Fluids:  Total I/O In: 597.6 [I.V.:97.6; IV Piggyback:500] Out: 60 [Urine:60]  Complications: None  Drains:  none   Specimen: * No specimens in log *   Disposition:  PACU - hemodynamically stable.  Plan of Care: Continue ICU care    Indications for Procedure: [name] is a 79 y.o. female who presented with a necrotizing infection of the sacrum and perianal area.  She underwent initial debridement on arrival.  I recommended return to the OR for additional debridement if necessary as well as a loop colostomy due to the location of the wound.  I discussed this with the patient's son who granted consent to proceed.  We discussed the procedure itself as well as its risk, benefits, and alternatives.  Findings: No additional debridement needed of the sacral and perianal wound at this time, there was some thicker fluid in the wound anteriorly heading towards the labia and we will have to keep an eye on this area for possible future debridements, the posterior portion of the wound appeared healthy.     Description of Procedure:   On the date stated above the patient was taken operating suite.  The patient was rolled on her side and the sacral and perianal wound was redressed.  On examination I did not appear to need additional debridement so we went ahead and moved the patient onto the operating room table and performed the loop colostomy.  The patient's abdomen  is prepped and draped in usual sterile fashion.  General inotrope anesthesia was induced.  A timeout was completed verifying the correct patient, procedure, position, and equipment needed for the case.  I created a 5 mm incision just below the umbilicus and inserted the Veress needle.  The abdomen was inflated to 15 mmHg.  A 5 mm trocar was placed in this position.  Two additional 5 mm trocars were placed in the right lower quadrant.  I thought about creating a sigmoid ostomy however there was some inflammation down in the pelvis so I selected the transverse colon.  The distal transverse colon was mobilized off the omentum using the LigaSure bipolar [place]  dissector.  The gastrocolic ligament was divided and this dissection was carried to the splenic flexure.  I also divided the white line of Toldt and elevated the left colon out of the retroperitoneum.  I worked both from the descending colon and from the transverse colon to divide the splenocolic ligament and fully mobilized the splenic flexure.  This provided additional length to the transverse colon to allow for loop colostomy creation in this patient with a deeper abdominal wall.  I then created a circular incision in the skin.  This was above where she was marked in the preoperative area due to the location of the colon.  I removed a circular core of subcutaneous fatty tissue.  I made a cruciate incision in the anterior rectus sheath.  I split the rectus muscles along their length and made an incision in the posterior rectus  sheath and peritoneum entering the abdomen.  This opening was dilated using my fingers and a loop of distal transverse colon was delivered through this defect for creation of the ostomy.  A defect was made in the mesentery just along the wall of the colon and a red rubber catheter was passed through here to act as a support bar for the loop colostomy.  The 5 mm trocar sites were closed at the skin using 4-0 Monocryl and Dermabond.  A  ostomy was created and the loop of colon and I placed multiple Brooke's sutures circumferentially around the ostomy and then placed multiple sutures to reapproximate the mucocutaneous junction to matured the ostomy.  These were all 2-0 Vicryl suture.  The ostomy appeared widely patent.  A ostomy appliance was applied and the patient was transferred back to the ICU in stable condition.  All sponge needle counts are correct at the end of this case.  At the end of the case we reviewed the infection status of the case. Patient: [name] [facility] Patient Case: Urgent Infection Present At Time Of Surgery (PATOS): Dirty sacral/perianal wound, some contamination related to creating a loop colostomy  [name] Foy, MD General, Bariatric, & Minimally Invasive Surgery [facility]  "

## 2024-06-04 NOTE — Progress Notes (Addendum)
 "  NAME:  [name], MRN:  [id number], DOB:  07/22/1945, LOS: 3 ADMISSION DATE:  06/01/2024, CONSULTATION DATE:  06/01/2024 REFERRING MD:  Lyndel Mt MD CHIEF COMPLAINT:  Necrotizing Facientis    History of Present Illness:  [name] is a 79 y.o. female w/ PMH significant for HTN, dementia, HLD, diastolic dysfunction, and a recent hospitalization in January from a fall w/ subsequent rhabdo. Patient presented with fever and necrotizing sacral wound. Surgery consulted and taken immediately to OR for debridement.  Consulted post-op for continuation of intubation & sedation post op w/ tentative plans to return to OR 2/4.   HPI poor historian d/t intubation with hx obtain via chart review.  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   Pneumonia    years ago     Significant Hospital Events: Including procedures, antibiotic start and stop dates in addition to other pertinent events   2/3 Admitted to ICU intubated/vented s/p OR debridement 2/5 Plan to take back to OR for wound check, with additional debridement  2/6 Unable to take patient back to OR yesterday, due to low hgb- needing 2 units of PRBC, hgb stable now, plan for OR re examination and debridement   Interim History / Subjective:  Intubated, sedated but responds purposefully to pain   Objective   Blood pressure (!) 110/57, pulse 85, temperature 98.6 F (37 C), resp. rate 18, height 5' 3 (1.6 m), weight 110.2 kg, SpO2 100%.    Vent Mode: PRVC FiO2 (%):  [30 %] 30 % Set Rate:  [14 bmp] 14 bmp Vt Set:  [410 mL] 410 mL PEEP:  [5 cmH20] 5 cmH20 Plateau Pressure:  [12 cmH20-16 cmH20] 12 cmH20    Intake/Output Summary (Last 24 hours) at 06/04/2024 0946 Last data filed at 06/04/2024 9263 Gross per 24 hour  Intake 3432.93 ml  Output 350 ml  Net 3082.93 ml   Filed Weights   06/02/24 0451 06/03/24 0500 06/04/24 0500  Weight: 101.1 kg 106.6 kg 110.2 kg    Examination: General: acute on chronic older adult female, lying in icu bed on vent  HEENT: Normocephalic, PERRLA intact, ETT, OG, Pink MM CV: s1,s2, RRR, no MRG, No JVD  pulm: clear, diminished, no distress on vent  Abs: bs active, soft, flexiseal in place  Extremities: no edema, no deformity, moves all extremities on command Skin: no rash  Neuro: Rass -2, responds to painful stimuli, cough gag reflex present  GU: foley intact       Resolved Hospital Problem list   Mildly elevated LFTs Hypernatremia   Assessment & Plan:   Sepsis 2/2 to nec fasc from sacral wound s/p debridement on 2/3 NGTD on blood cultures  Abundant GNR, few GPC on Aerobic/Anaerobic Cx from surgical wound> growing proteus  Leukocytosis- 13.8 <26.5 < 20.6  - Neut count 21.3 on 2/4  P:  ID following- per recs continue abx cefepime , flagyl  and zyvox > may be able to come of zyvox  tomorrow on 2/7  Continue to follow culture data  Plan is to go back to OR later today on 2/6  Off pressors, off fluids for now- did get 2 units of PRBCs yesterday for hgb 5.5 > 9.1  In OR, possible colostomy placement for bowel diversion for wound protection -Can continue flexiseal for now  Continue to trend WBC, decreasing Wound care also following appreciate assistance  Hypotension in setting of above Acute Anemia/Blood loss from sacral wound Anemia of Chronic Disease Hx of HTN- not on any antihypertensives outpt?  Hgb 5.5> 9.1 after 2 units of PRBCs P: Continue to trend CBC Currently off of pressors Continue to monitor BP via aline    Post op vent management P: Continue ventilator support and lung protective strategies  Continue LTVV  Wean PEEP and Fio2  requirements to sat goal of >92%  HOB > 30 degrees Plat < 30  Aim for Driving pressures < 15  Intermittent Chest X-ray and ABGS follow cultures-blood cultures, and wound cultures  VAP and PAD protocols in place  Wean sedation as tolerated, and WUA daily> can continue sedation for now, can try to perform SBT after OR later today on 2/7, hopefully extubate later this weekend    CKD IIIa P: Continue to trend renal function daily  Continue to monitor and optimize electrolytes daily Continue to monitor urine output Continue strict I/Os Continue Adequate renal perfusion  Avoid nephrotoxic agents  Continue free water  for now- decrease from to 150 ml q4hrs  Follow up with morning labs   Dementia P: Continue aricept     Critical care time: The patient is critically ill with multiple organ system failure and requires high complexity decision making for assessment and support, frequent evaluation and titration of therapies, advanced monitoring, review of radiographic studies and interpretation of complex data.    Critical Care Time devoted to patient care services, exclusive of separately billable procedures, described in this note is 40 mins   CC: 40 mins   Christian Sevilla Murtagh AGACNP-BC   [facility] 06/04/2024, 9:46 AM  Please see [email or web address] for pager details.  From 7A-7P if no response, please call [phone]. After hours, please call ELink [phone].   Please see [email or web address] for pager details          "
